# Patient Record
Sex: Male | Born: 1935 | Race: White | Hispanic: No | Marital: Married | Smoking: Never smoker
Health system: Southern US, Community
[De-identification: ages and names within clinical notes are randomized; demographics above are authoritative.]

## PROBLEM LIST (undated history)

## (undated) ENCOUNTER — Other Ambulatory Visit: Admission: RE | Payer: Self-pay | Source: Ambulatory Visit | Admitting: Transplant Surgery Liver and Kidney

## (undated) DIAGNOSIS — F0390 Unspecified dementia without behavioral disturbance: Secondary | ICD-10-CM

## (undated) DIAGNOSIS — E119 Type 2 diabetes mellitus without complications: Secondary | ICD-10-CM

## (undated) DIAGNOSIS — M1990 Unspecified osteoarthritis, unspecified site: Secondary | ICD-10-CM

## (undated) DIAGNOSIS — I509 Heart failure, unspecified: Secondary | ICD-10-CM

## (undated) DIAGNOSIS — I4891 Unspecified atrial fibrillation: Secondary | ICD-10-CM

## (undated) DIAGNOSIS — D689 Coagulation defect, unspecified: Secondary | ICD-10-CM

## (undated) DIAGNOSIS — B019 Varicella without complication: Secondary | ICD-10-CM

## (undated) DIAGNOSIS — R002 Palpitations: Secondary | ICD-10-CM

## (undated) DIAGNOSIS — M889 Osteitis deformans of unspecified bone: Secondary | ICD-10-CM

## (undated) DIAGNOSIS — R001 Bradycardia, unspecified: Secondary | ICD-10-CM

## (undated) DIAGNOSIS — N400 Enlarged prostate without lower urinary tract symptoms: Secondary | ICD-10-CM

## (undated) DIAGNOSIS — D6859 Other primary thrombophilia: Secondary | ICD-10-CM

## (undated) HISTORY — DX: Osteitis deformans of unspecified bone: M88.9

## (undated) HISTORY — DX: Other primary thrombophilia: D68.59

## (undated) HISTORY — DX: Heart failure, unspecified: I50.9

## (undated) HISTORY — PX: TONSILLECTOMY: SHX28A

## (undated) HISTORY — PX: GALLBLADDER SURGERY: SHX652

## (undated) HISTORY — DX: Type 2 diabetes mellitus without complications: E11.9

## (undated) HISTORY — DX: Unspecified dementia, unspecified severity, without behavioral disturbance, psychotic disturbance, mood disturbance, and anxiety: F03.90

## (undated) HISTORY — DX: Unspecified osteoarthritis, unspecified site: M19.90

## (undated) HISTORY — PX: SKIN GRAFT: SHX250

## (undated) HISTORY — DX: Bradycardia, unspecified: R00.1

## (undated) HISTORY — PX: EYE SURGERY: SHX253

## (undated) HISTORY — DX: Coagulation defect, unspecified: D68.9

## (undated) HISTORY — PX: KNEE REPLACEMENT: SHX530B

## (undated) HISTORY — DX: Varicella without complication: B01.9

## (undated) HISTORY — PX: JOINT REPLACEMENT: SHX530

## (undated) HISTORY — PX: APPENDECTOMY: SHX54

## (undated) HISTORY — DX: Palpitations: R00.2

## (undated) HISTORY — DX: Benign prostatic hyperplasia without lower urinary tract symptoms: N40.0

## (undated) HISTORY — PX: CHOLECYSTECTOMY: SHX55

## (undated) HISTORY — PX: KNEE SURGERY: SHX244

## (undated) HISTORY — DX: Unspecified dementia without behavioral disturbance: F03.90

## (undated) HISTORY — PX: TONSILLECTOMY: SUR1361

## (undated) HISTORY — DX: Unspecified atrial fibrillation: I48.91

---

## 1991-12-18 DIAGNOSIS — F0390 Unspecified dementia without behavioral disturbance: Secondary | ICD-10-CM

## 1991-12-18 HISTORY — DX: Unspecified dementia, unspecified severity, without behavioral disturbance, psychotic disturbance, mood disturbance, and anxiety: F03.90

## 2000-07-09 ENCOUNTER — Other Ambulatory Visit: Payer: Self-pay

## 2001-08-29 ENCOUNTER — Encounter: Payer: Self-pay | Admitting: Cardiology

## 2001-08-30 ENCOUNTER — Encounter: Payer: Self-pay | Admitting: Cardiology

## 2004-03-22 ENCOUNTER — Other Ambulatory Visit: Payer: Self-pay

## 2004-08-10 ENCOUNTER — Encounter: Payer: Self-pay | Admitting: Cardiology

## 2004-09-06 DIAGNOSIS — E669 Obesity, unspecified: Secondary | ICD-10-CM | POA: Insufficient documentation

## 2004-09-06 DIAGNOSIS — I498 Other specified cardiac arrhythmias: Secondary | ICD-10-CM | POA: Insufficient documentation

## 2004-09-06 DIAGNOSIS — F068 Other specified mental disorders due to known physiological condition: Secondary | ICD-10-CM | POA: Insufficient documentation

## 2004-09-06 DIAGNOSIS — E1169 Type 2 diabetes mellitus with other specified complication: Secondary | ICD-10-CM | POA: Insufficient documentation

## 2004-09-06 DIAGNOSIS — M889 Osteitis deformans of unspecified bone: Secondary | ICD-10-CM | POA: Insufficient documentation

## 2005-10-19 LAB — HM COLONOSCOPY

## 2009-01-24 DIAGNOSIS — E8809 Other disorders of plasma-protein metabolism, not elsewhere classified: Secondary | ICD-10-CM | POA: Insufficient documentation

## 2009-11-01 ENCOUNTER — Ambulatory Visit: Payer: Self-pay | Admitting: Otolaryngology

## 2009-11-04 DIAGNOSIS — R040 Epistaxis: Secondary | ICD-10-CM | POA: Insufficient documentation

## 2009-11-07 ENCOUNTER — Ambulatory Visit
Admit: 2009-11-07 | Discharge: 2009-11-07 | Disposition: A | Discharge status: Home / Self Care | Payer: Self-pay | Source: Ambulatory Visit | Attending: Internal Medicine | Admitting: Internal Medicine

## 2009-11-07 LAB — PROTIME-INR
INR: 2.3 — ABNORMAL HIGH (ref 0.9–1.1)
Protime: 25.4 s — ABNORMAL HIGH (ref 11.9–14.7)

## 2009-11-16 ENCOUNTER — Ambulatory Visit: Payer: Self-pay | Admitting: Urology

## 2009-11-18 ENCOUNTER — Ambulatory Visit: Payer: Self-pay | Admitting: Internal Medicine

## 2009-11-28 ENCOUNTER — Encounter: Payer: Self-pay | Admitting: Gastroenterology

## 2009-11-28 ENCOUNTER — Ambulatory Visit: Payer: Self-pay | Admitting: Internal Medicine

## 2009-11-29 ENCOUNTER — Ambulatory Visit: Payer: Self-pay | Admitting: Urology

## 2009-12-02 LAB — MEDICAL CYTOLOGY

## 2009-12-05 ENCOUNTER — Ambulatory Visit
Admit: 2009-12-05 | Discharge: 2009-12-05 | Disposition: A | Discharge status: Home / Self Care | Payer: Self-pay | Source: Ambulatory Visit | Attending: Internal Medicine | Admitting: Internal Medicine

## 2009-12-05 LAB — CBC
Hematocrit: 40 % (ref 40–51)
Hemoglobin: 13.8 g/dL (ref 13.7–17.5)
MCV: 92 fL (ref 79–92)
Platelets: 103 THOU/uL — ABNORMAL LOW (ref 150–330)
RBC: 4.3 MIL/uL — ABNORMAL LOW (ref 4.6–6.1)
RDW: 13.2 % (ref 11.6–14.4)
WBC: 5.6 THOU/uL (ref 4.2–9.1)

## 2009-12-05 LAB — COMPREHENSIVE METABOLIC PANEL
ALT: 26 U/L (ref 0–50)
AST: 29 U/L (ref 0–50)
Albumin: 4.2 g/dL (ref 3.5–5.2)
Alk Phos: 65 U/L (ref 40–130)
Anion Gap: 10 (ref 7–16)
Bilirubin,Total: 0.4 mg/dL (ref 0.0–1.2)
CO2: 24 mmol/L (ref 20–28)
Calcium: 8.7 mg/dL (ref 8.6–10.2)
Chloride: 108 mmol/L (ref 96–108)
Creatinine: 0.85 mg/dL (ref 0.67–1.17)
GFR,Black: >59 *
GFR,Caucasian: >59 *
Glucose: 156 mg/dL — ABNORMAL HIGH (ref 74–106)
Lab: 19 mg/dL (ref 6–20)
Potassium: 4.5 mmol/L (ref 3.3–5.1)
Sodium: 142 mmol/L (ref 133–145)
Total Protein: 6.7 g/dL (ref 6.3–7.7)

## 2009-12-05 LAB — LIPID PANEL
Chol/HDL Ratio: 3.1
Cholesterol: 145 mg/dL
HDL: 47 mg/dL
LDL Calculated: 73 mg/dL
Non HDL Cholesterol: 98 mg/dL
Triglycerides: 126 mg/dL

## 2009-12-05 LAB — PSA: PSA (eff. 4-2010): 0.11 ng/mL (ref 0.00–4.00)

## 2009-12-05 LAB — PROTIME-INR
INR: 2.5 — ABNORMAL HIGH (ref 0.9–1.1)
Protime: 27 s — ABNORMAL HIGH (ref 11.9–14.7)

## 2009-12-05 LAB — TSH: TSH: 5.06 u[IU]/mL — ABNORMAL HIGH (ref 0.27–4.20)

## 2009-12-05 NOTE — Progress Notes (Addendum)
Reason For Visit   [name] is here for a diabetic visit. RBakerLPN Going to [facility]   in the near future, sugars on average 125-130 before breakfast, 97 at   lowest, no symptomatic high or low sugars, no side effects from medication,   sees ophthalmology on a regular basis, no foot complaints, history of   obesity, historically difficult to lose weight, limited aerobic activity,   he feels generally well otherwise and has no specific complaints today.     Medication list reviewed and updated     Review of systems is otherwise negative for other constitutional,   endocrine, cardiac, pulmonary, gastrointestinal, genitourinary complaints     Pleasant, obese, middle-aged gentleman in no acute distress  Oropharynx is pink and moist  Neck is supple without bruits  Cardiac exam has regular rhythm with moderate rate, normal S1 and S2   without murmurs or ectopy  Lungs are clear without wheezes, crackles or evidence of consolidation  Abdomen is benign  No lower extremity edema  Toes are intact light touch with diminished vibratory sensation below the   waist bilaterally     BMI = 19.33     75 year old gentleman with history of protein S deficiency leading to DVT,   stable on Coumadin, history of diabetes which has historically been well   controlled on his current regimen, encouraged increased efforts to get his   hemoglobin A1c below 7,  continuing current medications, encouraged ongoing   aerobic activity, increased efforts towards weight loss through dietary   discretion and increased aerobic exercise, up-to-date on ophthalmologic   exam, no foot findings or complaints, and genuine current medicines.  Would   like his BMI below 30 which would correspond to a maximum weight of 220   pounds.  We'll see him back in April when he returns from down [place].   Amended: Jon [name] ; 12/05/2009 8:09 AM EST.  Allergies   Latex-asked/denied  No Known Drug Allergy.  Current Meds   Simvastatin 40 MG Tablet;TAKE 1 TABLET  DAILY IN THE EVENING.; Rx  Uroxatral 10 MG Tablet Extended Release 24 Hour;TAKE 1 TABLET DAILY by   mouth; Rx  Hydrocodone-Acetaminophen 5-500 MG Tablet;TAKE 1-2 TABLET PO UP TO TID AS   NEEDED. MDD=6; Rx  Aricept 10 MG Tablet;TAKE 1 TABLET DAILY.; Rx  Namenda 10 MG Tablet;TAKE TABLET TWICE DAILY; Rx  Lovastatin 40 MG Tablet;TAKE 1 TABLET DAILY.; Rx  Gabapentin 300 MG Capsule;TAKE 1 CAPSULE TWICE DAILY.; Rx  Vitamin D 2000 UNIT Tablet;TAKE 1 TABLET DAILY; RPT  Erythromycin 5 MG/GM Ointment;1/2" to each eye at HS for up to 1 week; Rx  Sulfacetamide Sodium 10 % Solution;1-2 drops each eye every 3-4 hours for   up to one week.; Rx  Coumadin 2.5 MG Tablet;1 and 1/2 tablets daily; Rx  MetFORMIN HCl 500 MG Tablet;2 tablets by mouth in the morning and 1 by   mouth in the evening (with meals); Rx  Finasteride 5 MG Tablet;TAKE 1 TABLET AS DIRECTED; Rx  Lovenox 150 MG/ML Solution;; RPT  OneTouch Test Strip;Test blood glucose 3 times daily; Rx  One Touch Glucose Control SOLN;USE AS DIRECTED.; Rx  Cyclobenzaprine HCl 10 MG Tablet;TAKE 1 TABLET BEDTIME PRN; Rx.  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  Vital Signs   Recorded by rbaker on 28 Nov 2009 10:54 AM  BP:120/80,  LUE,  Sitting,   HR: 66 b/min,  R Radial, Regular,   Temp: 97.7 F,  Oral,   Weight: 227.4 lb.  Results   PROTIME - PT   07 Nov 2009 03:38 PM  -   INR: 2.3   -   PTI: 25.4 sec  CBC/PLT - CBC   01 Aug 2009 09:04 AM  -   WBC: 5.6 thou/uL  -   RBC: 4.7 MIL/uL  -   HEMOGLOBIN: 14.2 g/dl  -   HEMATOCRIT: 43 %  -   MCV: 92 fL  -   RDW: 13.7 %  -   PLATELET COUNT: 132 thou/uL  COMPREHENSIVE METABOLIC PROF - CMP   01 Aug 2009 09:04 AM  -   SODIUM: 138 mmol/L  -   POTASSIUM: 4.5 mmol/L  -   CHLORIDE: 103 mmol/L  -   CO2: 25 mmol/L  -   ANION  GAP: 10   -   UREA NITROGEN: 19 mg/dL  -   CREATININE: 1.61 mg/dL  -   GFR,CAUCASIAN: > 59  -   GFR,BLACK: > 59  -   GLUCOSE: 138 mg/dL  -   CALCIUM: 9.1 mg/dL  -   TOTAL PROTEIN: 6.9 g/dl  -   ALBUMIN: 4.3 g/dl  -   ALKALINE PHOSPHATASE: 65 u/l  -   T BILI: 0.4 mg/dL  -   AST: 33 u/l  -   ALT: 27 u/l  HEMOGLOBIN A1C - HBA1C   01 Aug 2009 09:04 AM  -   HGB A1C: 7.2 %.  Quality Metrics   A recent examination by an ophthalmologist  and by a podiatrist.  No   tobacco use.  A previous history of smoking.  Monofilament wire test of the   foot was normal.  No lesions on the feet.  Signature   Electronically signed by: Sharmon Leyden  M.D.; 12/05/2009 8:08 AM EST.  Electronically signed by: Sharmon Leyden  M.D.; 12/05/2009 8:09 AM EST.

## 2009-12-06 LAB — HEMOGLOBIN A1C: Hemoglobin A1C: 6.8 % — ABNORMAL HIGH (ref 4.0–6.0)

## 2010-03-22 ENCOUNTER — Ambulatory Visit
Admit: 2010-03-22 | Discharge: 2010-03-22 | Disposition: A | Discharge status: Home / Self Care | Payer: Self-pay | Source: Ambulatory Visit | Attending: Internal Medicine | Admitting: Internal Medicine

## 2010-03-22 LAB — PROTIME-INR
INR: 2 — ABNORMAL HIGH (ref 0.9–1.1)
Protime: 22.9 s — ABNORMAL HIGH (ref 11.9–14.7)

## 2010-03-29 ENCOUNTER — Ambulatory Visit: Payer: Self-pay

## 2010-03-29 VITALS — BP 138/70 | HR 64 | Temp 97.5°F | Resp 18 | Wt 229.1 lb

## 2010-03-29 DIAGNOSIS — I82409 Acute embolism and thrombosis of unspecified deep veins of unspecified lower extremity: Secondary | ICD-10-CM

## 2010-03-30 NOTE — Progress Notes (Signed)
Subjective:      Patient ID: NIMA SILL is a 76 y.o. 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    HPI - Interval history  Since his last visit, Rayansh has continued to do well.  He is active and has not experienced any adverse bleeding events.  He voices no complaints.  Patient's medications, allergies, past medical, surgical, social and family histories were reviewed and updated as appropriate.    Review of Systems   All other systems reviewed and are negative.            Objective:   Filed Vitals:    03/29/10 0843   BP: 138/70   Pulse: 64   Temp: 36.4 C (97.5 F)   Resp: 18         No results found for this or any previous visit (from the past 72 hour(s)).        Physical Exam   Vitals reviewed.  Constitutional: No distress.   Eyes: No scleral icterus.   Cardiovascular: Normal rate, regular rhythm and normal heart sounds.  Exam reveals no gallop and no friction rub.    No murmur heard.  Pulmonary/Chest: Effort normal and breath sounds normal. No respiratory distress. He has no wheezes. He has no rales.   Abdominal: Soft. Bowel sounds are normal. He exhibits no distension and no mass. No tenderness. He has no rebound and no guarding.   Musculoskeletal: He exhibits no edema and no tenderness.   Lymphadenopathy:     He has no cervical adenopathy.       Assessment:     Mr. 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    We are happy to see that he is tolerating coumadin well.  We did not make any changes to his treatment today.  We reviewed the incidence of major adverse bleeding events and advised him to monitor for these.  We did not schedule a return follow up for him, however we remain available should we be of any further assistance.    Plan:     1.  Continue long term coumadin  2.  F/u prn    Merrilee Seashore  MD  Hematology/Oncology Fellow    CC: Abbie Sons

## 2010-04-04 NOTE — Progress Notes (Signed)
Subjective:      Patient ID: Jon [name] is a 76 y.o. 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    HPI - Interval history  Since his last visit, Jon [name] has continued to do well.  He is active and has not experienced any adverse bleeding events.  He voices no complaints.  Patient's medications, allergies, past medical, surgical, social and family histories were reviewed and updated as appropriate.    Review of Systems   All other systems reviewed and are negative.            Objective:   Filed Vitals:    03/29/10 0843   BP: 138/70   Pulse: 64   Temp: 36.4 C (97.5 F)   Resp: 18         No results found for this or any previous visit (from the past 72 hour(s)).        Physical Exam   Vitals reviewed.  Constitutional: No distress.   Eyes: No scleral icterus.   Cardiovascular: Normal rate, regular rhythm and normal heart sounds.  Exam reveals no gallop and no friction rub.    No murmur heard.  Pulmonary/Chest: Effort normal and breath sounds normal. No respiratory distress. He has no wheezes. He has no rales.   Abdominal: Soft. Bowel sounds are normal. He exhibits no distension and no mass. No tenderness. He has no rebound and no guarding.   Musculoskeletal: He exhibits no edema and no tenderness.   Lymphadenopathy:     He has no cervical adenopathy.       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    We are happy to see that he is tolerating coumadin well.  We did not make any changes to his treatment today.  We reviewed the incidence of major adverse bleeding events and advised him to monitor for these.  We did not schedule a return follow up for him, however we remain available should we be of any further assistance.    Plan:     1.  Continue long term coumadin  2.  F/u prn    Merrilee Seashore  MD  Hematology/Oncology Fellow    I saw and evaluated the patient. I agree with the resident's/fellow's findings and plan of care as documented above.    Charlsie Merles      CC: Abbie Sons

## 2010-04-14 ENCOUNTER — Ambulatory Visit: Payer: Self-pay | Admitting: Internal Medicine

## 2010-04-14 NOTE — Progress Notes (Signed)
Reason For Visit   Follow up appt for diabetes check and to review test results. llayer,lpn   Feels tired since being on gabapentin taking it BID more energy with recent   increase in exercising, long-standing history of diabetes, minimal   neuropathy, sees ophthalmology and podiatry regularly, no recent symptoms   to suggest high or low sugars, history of obesity with recent weight gain   in the context of diminished activity, long-standing history of   hyperlipidemia which has been at goal on current regimen, no current   vascular complaints.     Medications reviewed and updated     Review of systems is otherwise negative for other constitutional,   endocrine, cardiac, pulmonary, gastrointestinal, genitourinary complaints     Pleasant overweight 76 year old gentleman in no acute distress  Oropharynx is pink and moist  Neck is supple without bruits  Cardiac exam has regular rhythm with moderate rate, normal S1-S2 without   murmurs  Lungs are clear without wheezes or crackles  No costovertebral angle tenderness  Abdomen is soft, nontender with normal active bowel sounds, no bruits  No lower extremity edema  Toes are intact to light touch with diminished vibratory sensation below   the knee, no foot lesions     Diabetes-well-controlled, continuing current medicines, encouraged ongoing   efforts towards weight loss through dietary discretion and increased   aerobic exercise, lab slip given to check hemoglobin A1c at his convenience     Obesity-encouraged to lose weight aiming for a BMI well over 30, current   BMI = 30.5     Hypertension/elevated blood pressure, has been excellent in the past,   although today, recheck 160/88, encouraged monitoring this and colon with   representative readings in the near future, weight loss, low sodium diet     Hyperlipidemia-at goal in the past no vascular complaints at present,   continuing current medication rechecking fasting lipid profile with next   blood draw     RTC 3 months  lab slip given.  Allergies   Latex-asked/denied  No Known Drug Allergy.  Current Meds   One Touch Glucose Control SOLN;USE AS DIRECTED.; Rx  Lovenox 150 MG/ML Solution;; RPT  Coumadin 2.5 MG Tablet;1 and 1/2 tablets daily; Rx  Gabapentin 300 MG Capsule;TAKE 1 CAPSULE TWICE DAILY.; Rx  MetFORMIN HCl 500 MG Tablet;2 tablets by mouth in the morning and 1 by   mouth in the evening (with meals); Rx  Simvastatin 40 MG Tablet;TAKE 1 TABLET DAILY IN THE EVENING.; Rx  Hydrocodone-Acetaminophen 5-500 MG Tablet;1-2 po q 6 hours prn pain MDD=6;   Rx  Namenda 10 MG Tablet;TAKE ONE TABLET BY MOUTH TWICE A DAY; Rx  Donepezil HCl 10 MG Tablet;take one tablet by mouth every day; Rx  Finasteride 5 MG Tablet;TAKE 1 TABLET AS DIRECTED; Rx  OneTouch Test Strip;TEST 3 TIMES DAILY; Rx.  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  Vital Signs   Recorded by [facility] on 14 Apr 2010 09:02 AM  BP:154/78,  LUE,  Sitting,   HR: 64 b/min,  R Radial, Regular,   Weight: 231 lb.  Quality Metrics   A recent examination by an ophthalmologist  June 2010 and by a podiatrist    04/14/2010.  No tobacco use  and no previous  history of smoking.  A blood   sugar level by fingerstick was determined  2-3 times daily.  Signature   Electronically signed by: Sharmon Leyden  M.D.; 04/14/2010 9:52 AM EST.

## 2010-04-17 ENCOUNTER — Ambulatory Visit
Admit: 2010-04-17 | Discharge: 2010-04-17 | Disposition: A | Discharge status: Home / Self Care | Payer: Self-pay | Source: Ambulatory Visit | Attending: Internal Medicine | Admitting: Internal Medicine

## 2010-04-17 LAB — COMPREHENSIVE METABOLIC PANEL
ALT: 35 U/L (ref 0–50)
AST: 38 U/L (ref 0–50)
Albumin: 4.1 g/dL (ref 3.5–5.2)
Alk Phos: 65 U/L (ref 40–130)
Anion Gap: 7 (ref 7–16)
Bilirubin,Total: 0.5 mg/dL (ref 0.0–1.2)
CO2: 27 mmol/L (ref 20–28)
Calcium: 8.9 mg/dL (ref 8.6–10.2)
Chloride: 104 mmol/L (ref 96–108)
Creatinine: 0.86 mg/dL (ref 0.67–1.17)
GFR,Black: >59 *
GFR,Caucasian: >59 *
Glucose: 168 mg/dL — ABNORMAL HIGH (ref 74–106)
Lab: 15 mg/dL (ref 6–20)
Potassium: 4.6 mmol/L (ref 3.3–5.1)
Sodium: 138 mmol/L (ref 133–145)
Total Protein: 6.6 g/dL (ref 6.3–7.7)

## 2010-04-17 LAB — LIPID PANEL
Chol/HDL Ratio: 2.8
Cholesterol: 132 mg/dL
HDL: 48 mg/dL
LDL Calculated: 63 mg/dL
Non HDL Cholesterol: 84 mg/dL
Triglycerides: 105 mg/dL

## 2010-04-17 LAB — T3: T3: 120 ng/dL (ref 80–200)

## 2010-04-17 LAB — CBC
Hematocrit: 41 % (ref 40–51)
Hemoglobin: 13.5 g/dL — ABNORMAL LOW (ref 13.7–17.5)
MCV: 91 fL (ref 79–92)
Platelets: 120 THOU/uL — ABNORMAL LOW (ref 150–330)
RBC: 4.5 MIL/uL — ABNORMAL LOW (ref 4.6–6.1)
RDW: 13.3 % (ref 11.6–14.4)
WBC: 5 THOU/uL (ref 4.2–9.1)

## 2010-04-17 LAB — T4, FREE: Free T4: 0.8 ng/dL — ABNORMAL LOW (ref 0.9–1.7)

## 2010-04-17 LAB — TSH: TSH: 3.13 u[IU]/mL (ref 0.27–4.20)

## 2010-04-17 LAB — CK: CK: 134 U/L (ref 46–171)

## 2010-04-18 LAB — HEMOGLOBIN A1C: Hemoglobin A1C: 8.1 % — ABNORMAL HIGH (ref 4.0–6.0)

## 2010-04-19 ENCOUNTER — Ambulatory Visit: Payer: Self-pay | Admitting: Urology

## 2010-04-24 ENCOUNTER — Ambulatory Visit
Admit: 2010-04-24 | Discharge: 2010-04-24 | Disposition: A | Discharge status: Home / Self Care | Payer: Self-pay | Source: Ambulatory Visit | Attending: Internal Medicine | Admitting: Internal Medicine

## 2010-04-24 LAB — PROTIME-INR
INR: 2.4 — ABNORMAL HIGH (ref 0.9–1.1)
Protime: 26.2 s — ABNORMAL HIGH (ref 11.9–14.7)

## 2010-05-31 ENCOUNTER — Ambulatory Visit
Admit: 2010-05-31 | Discharge: 2010-05-31 | Disposition: A | Discharge status: Home / Self Care | Payer: Self-pay | Source: Ambulatory Visit | Attending: Internal Medicine | Admitting: Internal Medicine

## 2010-05-31 LAB — PROTIME-INR
INR: 2.7 — ABNORMAL HIGH (ref 0.9–1.1)
Protime: 29.1 s — ABNORMAL HIGH (ref 11.9–14.7)

## 2010-07-03 ENCOUNTER — Ambulatory Visit
Admit: 2010-07-03 | Discharge: 2010-07-03 | Disposition: A | Discharge status: Home / Self Care | Payer: Self-pay | Source: Ambulatory Visit | Attending: Internal Medicine | Admitting: Internal Medicine

## 2010-07-03 LAB — PROTIME-INR
INR: 2.8 — ABNORMAL HIGH (ref 0.9–1.1)
Protime: 30 s — ABNORMAL HIGH (ref 11.9–14.7)

## 2010-07-20 ENCOUNTER — Ambulatory Visit: Payer: Self-pay | Admitting: Internal Medicine

## 2010-07-28 ENCOUNTER — Ambulatory Visit
Admit: 2010-07-28 | Discharge: 2010-07-28 | Disposition: A | Discharge status: Home / Self Care | Payer: Self-pay | Source: Ambulatory Visit | Attending: Internal Medicine | Admitting: Internal Medicine

## 2010-07-28 LAB — PROTIME-INR
INR: 2 — ABNORMAL HIGH (ref 0.9–1.1)
Protime: 23.4 s — ABNORMAL HIGH (ref 11.9–14.7)

## 2010-08-02 ENCOUNTER — Ambulatory Visit: Payer: Self-pay | Admitting: Urology

## 2010-08-04 NOTE — Progress Notes (Signed)
Reason For Visit   Jon [name] is here with his wife for a rash on his left arm, stomach and left   thigh for one week. RBakerLPN Left arm first then abdomen then left thigh,   calamine and benadryl have helped itching, no travel, no change in soap,   fabric, diet or skin care,  no known toxic exposures, no other symptoms, no   fever, no GI distress     Medication list reviewed and updated     Review of systems is otherwise negative for constitutional, cardiac,   pulmonary, gastrointestinal, dermatologic complaints     Pleasant, 76 year old gentleman in no acute distress  Rash as above on left forearm, stomach and left thigh, papular, sparse   papules approximately 3-16mm on average possibly surrounding hair follicles,   some appeared to be unroofed and mild ulcerations, no associated   lymphadenopathy, no surrounding erythema, not vesicles      New onset rash of unclear etiology, possible folliculitis though cannot   rule out vasculitis, arranging mupirocin to use topically for presumptive   folliculitis, also given prescription for Medrol Dosepak in the event that   symptoms are worsening.  Given the level of uncertainty, arranging referral   to dermatology for more definitive assessment and treatment.  Allergies   Latex-asked/denied  No Known Drug Allergy.  Current Meds   Lovenox 150 MG/ML Solution;; RPT  Coumadin 2.5 MG Tablet;1 and 1/2 tablets daily; Rx  Hydrocodone-Acetaminophen 5-500 MG Tablet;1-2 po q 6 hours prn pain MDD=6;   Rx  Namenda 10 MG Tablet;TAKE ONE TABLET BY MOUTH TWICE A DAY; Rx  Finasteride 5 MG Tablet;TAKE 1 TABLET AS DIRECTED; Rx  Non-medication order(s);Blood pressure monitor for prn useDx: 401.9; Rx  OneTouch Test Strip;TEST 3 TIMES DAILY; Rx  Lovastatin 40 MG Tablet;take one tablet by mouth every day; Rx  Gabapentin 300 MG Capsule;TAKE ONE CAPSULE BY MOUTH TWICE A DAY; Rx  Sanctura XR 60 MG Capsule Extended Release 24 Hour;take 1 capsule by mouth   every morning; Rx  Donepezil HCl 10 MG  Tablet;take one tablet by mouth every day; Rx  MetFORMIN HCl 500 MG Tablet;2 tablets by mouth in the morning and 1 by   mouth in the evening (with meals); Rx  Simvastatin 40 MG Tablet;TAKE 1 TABLET DAILY IN THE EVENING.; Rx.  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  Vital Signs   Recorded by rbaker on 20 Jul 2010 04:02 PM  BP:124/68,  RUE,  Sitting,   HR: 64 b/min,  L Radial, Regular,   Weight: 229 lb.  Signature   Electronically signed by: Sharmon Leyden  M.D.; 08/04/2010 6:47 PM EST.

## 2010-09-05 ENCOUNTER — Ambulatory Visit
Admit: 2010-09-05 | Discharge: 2010-09-05 | Disposition: A | Discharge status: Home / Self Care | Payer: Self-pay | Source: Ambulatory Visit | Attending: Internal Medicine | Admitting: Internal Medicine

## 2010-09-05 LAB — COMPREHENSIVE METABOLIC PANEL
ALT: 34 U/L (ref 0–50)
AST: 32 U/L (ref 0–50)
Albumin: 4.1 g/dL (ref 3.5–5.2)
Alk Phos: 64 U/L (ref 40–130)
Anion Gap: 11 (ref 7–16)
Bilirubin,Total: 0.4 mg/dL (ref 0.0–1.2)
CO2: 24 mmol/L (ref 20–28)
Calcium: 8.9 mg/dL (ref 8.6–10.2)
Chloride: 103 mmol/L (ref 96–108)
Creatinine: 0.86 mg/dL (ref 0.67–1.17)
GFR,Black: >59 *
GFR,Caucasian: >59 *
Glucose: 189 mg/dL — ABNORMAL HIGH (ref 74–106)
Lab: 18 mg/dL (ref 6–20)
Potassium: 4.3 mmol/L (ref 3.3–5.1)
Sodium: 138 mmol/L (ref 133–145)
Total Protein: 6.6 g/dL (ref 6.3–7.7)

## 2010-09-05 LAB — CBC
Hematocrit: 41 % (ref 40–51)
Hemoglobin: 13.7 g/dL (ref 13.7–17.5)
MCV: 92 fL (ref 79–92)
Platelets: 117 THOU/uL — ABNORMAL LOW (ref 150–330)
RBC: 4.5 MIL/uL — ABNORMAL LOW (ref 4.6–6.1)
RDW: 13.4 % (ref 11.6–14.4)
WBC: 7.5 THOU/uL (ref 4.2–9.1)

## 2010-09-05 LAB — PROTIME-INR
INR: 2.4 — ABNORMAL HIGH (ref 0.9–1.1)
Protime: 26.1 s — ABNORMAL HIGH (ref 11.9–14.7)

## 2010-09-06 LAB — HEMOGLOBIN A1C: Hemoglobin A1C: 7.8 % — ABNORMAL HIGH (ref 4.0–6.0)

## 2010-10-04 ENCOUNTER — Ambulatory Visit
Admit: 2010-10-04 | Discharge: 2010-10-04 | Disposition: A | Discharge status: Home / Self Care | Payer: Self-pay | Source: Ambulatory Visit | Attending: Internal Medicine | Admitting: Internal Medicine

## 2010-10-04 LAB — PROTIME-INR
INR: 1.8 — ABNORMAL HIGH (ref 0.9–1.1)
Protime: 21.4 s — ABNORMAL HIGH (ref 11.9–14.7)

## 2010-10-12 ENCOUNTER — Ambulatory Visit
Admit: 2010-10-12 | Discharge: 2010-10-12 | Disposition: A | Discharge status: Home / Self Care | Payer: Self-pay | Source: Ambulatory Visit | Attending: Internal Medicine | Admitting: Internal Medicine

## 2010-10-12 LAB — PROTIME-INR
INR: 2 — ABNORMAL HIGH (ref 0.9–1.1)
Protime: 22.5 s — ABNORMAL HIGH (ref 11.9–14.7)

## 2010-11-21 ENCOUNTER — Ambulatory Visit
Admit: 2010-11-21 | Discharge: 2010-11-21 | Disposition: A | Discharge status: Home / Self Care | Payer: Self-pay | Source: Ambulatory Visit | Attending: Internal Medicine | Admitting: Internal Medicine

## 2010-11-21 LAB — PROTIME-INR
INR: 1.4 — ABNORMAL HIGH (ref 0.9–1.1)
Protime: 17.5 s — ABNORMAL HIGH (ref 11.9–14.7)

## 2010-11-24 ENCOUNTER — Ambulatory Visit
Admit: 2010-11-24 | Discharge: 2010-11-24 | Disposition: A | Discharge status: Home / Self Care | Payer: Self-pay | Source: Ambulatory Visit | Attending: Internal Medicine | Admitting: Internal Medicine

## 2010-11-24 LAB — LIPID PANEL
Chol/HDL Ratio: 3
Cholesterol: 138 mg/dL
HDL: 46 mg/dL
LDL Calculated: 68 mg/dL
Non HDL Cholesterol: 92 mg/dL
Triglycerides: 122 mg/dL

## 2010-11-24 LAB — CBC
Hematocrit: 43 % (ref 40–51)
Hemoglobin: 14 g/dL (ref 13.7–17.5)
MCV: 92 fL (ref 79–92)
Platelets: 134 THOU/uL — ABNORMAL LOW (ref 150–330)
RBC: 4.7 MIL/uL (ref 4.6–6.1)
RDW: 13.3 % (ref 11.6–14.4)
WBC: 5.7 THOU/uL (ref 4.2–9.1)

## 2010-11-24 LAB — COMPREHENSIVE METABOLIC PANEL
ALT: 30 U/L (ref 0–50)
AST: 30 U/L (ref 0–50)
Albumin: 4.2 g/dL (ref 3.5–5.2)
Alk Phos: 64 U/L (ref 40–130)
Anion Gap: 11 (ref 7–16)
Bilirubin,Total: 0.5 mg/dL (ref 0.0–1.2)
CO2: 25 mmol/L (ref 20–28)
Calcium: 9.1 mg/dL (ref 8.6–10.2)
Chloride: 107 mmol/L (ref 96–108)
Creatinine: 1.05 mg/dL (ref 0.67–1.17)
GFR,Black: >59 *
GFR,Caucasian: >59 *
Glucose: 161 mg/dL — ABNORMAL HIGH (ref 74–106)
Lab: 20 mg/dL (ref 6–20)
Potassium: 4.8 mmol/L (ref 3.3–5.1)
Sodium: 143 mmol/L (ref 133–145)
Total Protein: 6.9 g/dL (ref 6.3–7.7)

## 2010-11-24 LAB — PROTIME-INR
INR: 1.6 — ABNORMAL HIGH (ref 0.9–1.1)
Protime: 19.3 s — ABNORMAL HIGH (ref 11.9–14.7)

## 2010-11-24 LAB — CK: CK: 83 U/L (ref 46–171)

## 2010-11-25 LAB — HEMOGLOBIN A1C: Hemoglobin A1C: 8.1 % — ABNORMAL HIGH (ref 4.0–6.0)

## 2010-11-30 ENCOUNTER — Ambulatory Visit: Payer: Self-pay | Admitting: Internal Medicine

## 2010-12-01 ENCOUNTER — Ambulatory Visit
Admit: 2010-12-01 | Discharge: 2010-12-01 | Disposition: A | Discharge status: Home / Self Care | Payer: Self-pay | Source: Ambulatory Visit | Attending: Internal Medicine | Admitting: Internal Medicine

## 2010-12-01 LAB — PROTIME-INR
INR: 1.7 — ABNORMAL HIGH (ref 0.9–1.1)
Protime: 20.1 s — ABNORMAL HIGH (ref 11.9–14.7)

## 2010-12-19 NOTE — Progress Notes (Signed)
 Reason For Visit   Jon [name] comes in today with his wife for a follow up visit for diabetes and   hyperlipidemia., long-standing history of diabetes, minimal endorgan   damage, takes medications as prescribed, historically adequately controlled   although less so recently, long-standing history of hyperlipidemia which   has been under good control with simvastatin at current dosage, eats   low-fat, low-salt, low-cholesterol diet, long-standing obesity, has been   working on weight loss through dietary discretion and continuing aerobic   activity and has lost 3-4 pounds since August     Medication list reviewed and updated     Review of systems otherwise negative for other constitutional, endocrine,   cardiac, pulmonary, gastrointestinal, genitourinary complaints     Pleasant 77 year old gentleman in no acute distress  Oropharynx is pink and moist  Neck is supple without bruits  Cardiac exam has regular rhythm with moderate rate, normal S1-S2 without   murmurs or ectopy  Lungs are clear without wheezes, crackles or evidence of consolidation  Abdomen is benign  No lower extremity edema  Toes are intact to light touch and vibratory sensation is only mildly   diminished, no foot lesions.  Allergies   Latex-asked/denied  No Known Drug Allergy.  Current Meds   Finasteride 5 MG Tablet;TAKE 1 TABLET AS DIRECTED; Rx  Non-medication order(s);Blood pressure monitor for prn useDx: 401.9; Rx  OneTouch Test Strip;TEST 3 TIMES DAILY; Rx  Lovastatin 40 MG Tablet;take one tablet by mouth every day; Rx  Gabapentin 300 MG Capsule;TAKE ONE CAPSULE BY MOUTH TWICE A DAY; Rx  Sanctura XR 60 MG Capsule Extended Release 24 Hour;take 1 capsule by mouth   every morning; Rx  Mupirocin 2 % Ointment;APPLY SPARINGLY TO AFFECTED AREA(S) 3 TIMES A DAY; Rx  Coumadin 2.5 MG Tablet;1 and 1/2 tablets daily; Rx  Enoxaparin Sodium 100 MG/ML Solution;1 ml subcutaneously q 12 hours until   therapeutic on warfarin; Rx  Non-medication order(s);OK to be off  lovenox 24 hours before procedure; Rx  Non-medication order(s);Mr. Kawahara can be off of warfarin for 5 days for   an epidural injection but will require bridging with lovenox as we have   done in the past.; Rx  Simvastatin 40 MG Tablet;TAKE 1 TABLET DAILY IN THE EVENING.; Rx  Hydrocodone-Acetaminophen 5-500 MG Tablet;1-2 po q 6 hours prn pain MDD=6;   Rx  Namenda 10 MG Tablet;TAKE ONE TABLET BY MOUTH TWICE A DAY; Rx  MetFORMIN HCl 500 MG Tablet;Take 2 tablets twice a day.; Rx  Donepezil HCl 10 MG Tablet;take one tablet by mouth every day; Rx.  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  Vital Signs   Recorded by rbaker on 30 Nov 2010 11:24 AM  BP:120/78,  RUE,  Sitting,   HR: 68 b/min,  L Radial, Regular,   Weight: 225.2 lb.  Results   PROTIME - PT   24 Nov 2010 08:18 AM  -   INR: 1.6   -   PTI: 19.3 sec  CBC/PLT - CBC   24 Nov 2010 08:18 AM  -   WBC: 5.7 thou/uL  -   RBC: 4.7 MIL/uL  -   HEMOGLOBIN: 14.0 g/dl  -   HEMATOCRIT: 43 %  -   MCV: 92 fL  -  RDW: 13.3 %  -   PLATELET COUNT: 134 thou/uL  COMPREHENSIVE METABOLIC PROF - CMP   24 Nov 2010 08:18 AM  -   SODIUM: 143 mmol/L  -   POTASSIUM: 4.8 mmol/L  -   CHLORIDE: 107 mmol/L  -   CO2: 25 mmol/L  -   ANION GAP: 11   -   UREA NITROGEN: 20 mg/dL  -   CREATININE: 6.04 mg/dL  -   GFR,CAUCASIAN: > 59  -   GFR,BLACK: > 59  -   GLUCOSE: 161 mg/dL  -   CALCIUM: 9.1 mg/dL  -   TOTAL PROTEIN: 6.9 g/dl  -   ALBUMIN: 4.2 g/dl  -   ALKALINE PHOSPHATASE: 64 u/l  -   T BILI: 0.5 mg/dL  -   AST: 30 u/l  -   ALT: 30 u/l  LIPID PROFILE - LIPID   24 Nov 2010 08:18 AM  -   CHOLESTEROL: 138 mg/dL  -   TRIGLYCERIDES: 540 mg/dL  -   HDL: 46 mg/dL  -   LDL (CALC): 68 mg/dL  -   CHOL/HDL RATIO: 3.0  -   NON HDL CHOLESTEROL: 92 mg/dL  CK - CK   24 Nov 2010 08:18  AM  -   CK: 83 u/l  HEMOGLOBIN A1C - HBA1C   24 Nov 2010 08:18 AM  -   HGB A1C: 8.1 %.  Assessment   Diabetes-historically well controlled though less well controlled over the   last year for unclear reasons, possibly greater dietary indiscretion,   possibly less exercise, he has successfully lost a few pounds since last   visit, encouraged adherence to ADA dietary restrictions, starting Januvia   50 mg p.o. q. day, prescription given with cautions, ongoing encouragement   to work on weight loss and increased aerobic activity to help minimize his   need for medications in the future.     Hyperlipidemia-well-controlled on current simvastatin dosage without side   effects, and no vascular symptoms at present, continuing current medication     Obesity-ongoing encouragement to work towards BMI well below 30     Continuing warfarin for coagulopathy, no recent recurrence of DVT or PE     We'll see him back in May/June for diabetes followup when he returns from   down [place].  Signature   Electronically signed by: Sharmon Leyden  M.D.; 12/19/2010 9:31 PM EST.

## 2010-12-25 NOTE — Letter (Signed)
November 29, 2009    Idolina Primer, MD  [address] [place], [place]  [address]      RE:   Baldomero, Deblanc  DOB:  Jul 17, 1934  Unit#: [id number]    Dear Dr. Meredith Mody:    Mr. Critchfield underwent an uncomplicated cystoscopy today for his history of  benign prostatic hypertrophy and lower urinary tract symptoms.    Cystoscopy demonstrated obstructing bilateral lobes of the prostate and the  median lobes goes into the bladder.  He had no evidence of bladder masses,  tumors, or stones, though he did have hypervascularity and petechiae with  filling.  He had some evidence of cellules and trabeculations consistent  with a longstanding history of obstruction.  I did tell Mr. Warzecha, it  does appear by the cystoscopic exam that he would most likely benefit from  resection of the prostate.  Mr. Badolato and his wife understand.  His wife  was in the room with him during the procedure.  They will follow up with me  on their return from [place] in May.    I will keep you updated regarding all urologic care.        Sincerely,              Electronically Signed and Finalized by  Hedda Slade, MD 12/06/2009 14:58  ____________________________________  Hedda Slade, MD          DD:   11/29/2009  DT:   11/29/2009 12:51 P  DVI:  [id number]  [id number]      cc:   Idolina Primer, MD

## 2010-12-25 NOTE — Miscellaneous (Unsigned)
 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)   Diagnosis: Protein S Deficiency (289.81)   Diagnosis: Sinus Bradycardia (427.89)   Diagnosis: Type II Diabetes Mellitus (250.00)   Diagnosis: Thrombophlebitis Of Deep Vessels Of The Lower Extremity 17 Mar 2004 (451.19)     Alerts  Allergy - Latex-asked/denied   Allergy - No Known Drug Allergy     Medications  Coumadin 2.5 MG Tablet; 1 and 1/2 tablets daily ; Rx   Donepezil HCl 10 MG Tablet; take one tablet by mouth every day ; Rx   Finasteride 5 MG Tablet; TAKE 1 TABLET AS DIRECTED ; Rx   Gabapentin 300 MG Capsule; TAKE ONE CAPSULE BY MOUTH TWICE A DAY ; Rx   Hydrocodone-Acetaminophen 5-500 MG Tablet; 1-2 po q 6 hours prn pain MDD=6   ; Rx   Januvia 50 MG Tablet; TAKE 1 TABLET DAILY. ; Rx   Lovastatin 40 MG Tablet; take one tablet by mouth every day ; Rx   MetFORMIN HCl 1000 MG Tablet; TAKE 1 TABLET TWICE DAILY. ; Rx   Mupirocin 2 % Ointment; APPLY SPARINGLY TO AFFECTED AREA(S) 3 TIMES A DAY ;   Rx   Namenda 10 MG Tablet; TAKE ONE TABLET BY MOUTH TWICE A DAY ; Rx   Non-medication order(s); Mr. Kishi can be off of warfarin for 5 days for   an epidural injection but will require bridging with lovenox as we have   done in the past. ; Rx   Non-medication order(s); OK to be off lovenox 24 hours before procedure ;   Rx   Non-medication order(s); Blood pressure monitor for prn useDx: 401.9 ; Rx   OneTouch Test Strip; TEST 3 TIMES DAILY ; Rx   OneTouch Ultra Control Solution; Use up to weekly for prn glucometer   calibration ; Rx   Sanctura XR 60 MG Capsule Extended Release 24 Hour; take 1 capsule by mouth   every morning ; Rx   Simvastatin 40 MG Tablet; TAKE 1 TABLET DAILY IN THE EVENING. ; Rx      Immunizations  Pneumo (Pneumovax)   Influenza (Split)   Influenza (Split)   Tetanus IG   Influenza (Split)   Influenza (Split)   Pneumo (Pneumovax)   Influenza (Split)   Influenza (Split)

## 2010-12-25 NOTE — Letter (Signed)
Apr 19, 2010    Idolina Primer, MD  [address] [place], [place]  [address]      RE:   [name], [name]  DOB:  [birth year]  Unit#: [id number]    Dear Dr. Meredith Mody:    I saw [name] in the urology clinic today in followup for his history  of benign prostatic hypertrophy and lower urinary tract symptoms.  At his  last visit with me, I had performed a cystoscopy which showed bilateral  obstructing lobes of the prostate, and [name] was going to think about  potentially going ahead with resection of the prostate while he was away in  the [place] this past couple of months.  He tells me that he has recently  begun working out 4 times per week up to 45 minutes a day and has noticed  with the increased exercise those urinary symptoms have improved.  Because  of this, he is reluctant to go ahead with any kind of intervention  surgically.    He does occasionally still have some leakage with straining, and I offered  him an anticholinergic to see if we can improve him in the short term in  order to optimize his exercising ability.  He would like to go ahead with  this, and I have given him a prescription as well as a sample of Sanctura.  His urine analysis today is normal.  I did tell him if he feels comfortable  and he is not bothered by his symptoms, then we can certainly continue to  follow him.  I would have him continue on his finasteride.  He will follow  up with me in 3 to 4 months to see how well the [name] is working for  him.    I will keep you updated regarding all urologic care.      Sincerely,              Electronically Signed and Finalized by  Hedda Slade, MD 04/24/2010 13:38  ____________________________________  Hedda Slade, MD          DD:   04/19/2010  DT:   04/19/2010  8:37 P  DVI:  [id number]  HY/[id number]#[id number]      cc:   Idolina Primer, MD

## 2010-12-25 NOTE — Letter (Signed)
August 02, 2010    Idolina Primer, MD  [address] [place], [place]  [address]      RE:   Jon [name], Jon [name]  DOB:  11/17/1934  Unit#: [id number]    Dear Dr. Meredith Mody:    I saw [name] in followup in the urology clinic today for his history  of benign prostatic hypertrophy and lower urinary tract symptoms.  Since  his last visit with me, he has had a couple of issues including back pain  which prevented him from exercising as often as previously and recent  steroid taper which caused some of his lower urinary tract symptoms to be  come exacerbated, but this is now resolving.  His urgency and frequency is  much improved on the [place], and he feels this is working well with him.      On physical exam, testes are descended bilaterally.  No testicular masses.  Normal uncircumcised phallus.  Foreskin retracts easily over the glans  penis.  Glans penis is normal.  Urethral meatus is normal.  No penile  plaques or abnormalities.  Scrotum, anus, and perineum are normal.  Sphincter tone is normal.  Prostate is enlarged but bilaterally  symmetrically so.  No nodules or abnormality.    We will check PSA on him with his next set of blood tests, and he will call  me for that result after it is completed.  In the meanwhile, he will  continue on his finasteride and Sanctura.    I will keep you updated regarding all urologic care.      Sincerely,              Electronically Signed and Finalized by  Hedda Slade, MD 08/08/2010 13:37  ____________________________________  Hedda Slade, MD          DD:   08/02/2010  DT:   08/02/2010 10:14 P  DVI:  [id number]  [id number]      cc:   Idolina Primer, MD

## 2010-12-25 NOTE — Letter (Signed)
 November 16, 2009    Idolina Primer, MD  [address] [place], [place]  [address]    RE:   Jon, [name]  DOB:  1934-10-29  Unit#: [id number]    Dear Dr. Meredith Mody:    I had the pleasure of seeing Jon [name] in Urology Clinic in follow-up for  his history of lower urinary tract symptoms and urinary retention.    He has done well over the past year, and he stopped his Uroxatral  approximately 1 week ago. He says, though, recently in the past couple of  months, he has noticed urinary urgency, which requires him to wear a pad,  as he often loses several drops of urine, which is quite socially  embarrassing for him. He says the episodes occur 2 to 3 times per month or  occasionally not at all for a month.    On physical examination, bladder is nonpalpable. Abdomen is soft with  positive bowel sounds, nontender, nondistended. Testes are descended  bilaterally. There are no testicular masses. He has a normal, uncircumcised  phallus. The foreskin retracts easily over the glans penis.  Normal  urethra. No evidence for glandular lesions. Scrotum, anus, and perineum are  normal. Prostate exam: Prostate is flat, smooth, and nontender. No  asymmetry or nodules.    At this point in time, I did tell Mr. Alcock that I would like him to  continue on the finasteride, but his urge incontinence is concerning to me,  and I did suggest to him that further diagnosis with cystoscopy in order to  evaluate the actual size of the obstructing lobes of the prostate may be  beneficial in planning future surgery, if necessary. I did tell him, also,  there is a chance that coming off of the Uroxatral over the next several  months, we may see complete resolution of these incontinence episodes.    He would like to go ahead with diagnostic cystoscopy, and he and his wife  are planning to go away for the winter to [place]. When they return  in April, he will give me an update on his status. I think this is  appropriate.    I will keep  you updated regarding all urologic care.    Sincerely,              Electronically Signed and Finalized by  Hedda Slade, MD 11/25/2009 16:39  ____________________________________  Hedda Slade, MD          DD:   11/16/2009  DT:   11/16/2009 11:56 A  DVI:  [id number]  [id number]/[id number]      cc:   Idolina Primer, MD

## 2011-04-04 LAB — PROTIME-INR: INR: 2.85

## 2011-04-19 ENCOUNTER — Ambulatory Visit: Payer: Self-pay | Admitting: Internal Medicine

## 2011-04-19 DIAGNOSIS — E8809 Other disorders of plasma-protein metabolism, not elsewhere classified: Secondary | ICD-10-CM

## 2011-04-27 ENCOUNTER — Other Ambulatory Visit: Payer: Self-pay

## 2011-04-27 DIAGNOSIS — G629 Polyneuropathy, unspecified: Secondary | ICD-10-CM

## 2011-04-27 MED ORDER — GABAPENTIN 300 MG PO CAPS
300.0000 mg | ORAL_CAPSULE | Freq: Two times a day (BID) | ORAL | Status: DC
Start: 2011-04-27 — End: 2011-11-27

## 2011-04-30 ENCOUNTER — Encounter: Payer: Self-pay | Admitting: Gastroenterology

## 2011-05-08 ENCOUNTER — Ambulatory Visit
Admit: 2011-05-08 | Discharge: 2011-05-08 | Disposition: A | Discharge status: Home / Self Care | Payer: Self-pay | Source: Ambulatory Visit | Attending: Internal Medicine | Admitting: Internal Medicine

## 2011-05-08 LAB — PROTIME-INR
INR: 2.6 — ABNORMAL HIGH (ref 0.9–1.1)
Protime: 26.9 s — ABNORMAL HIGH (ref 11.9–14.7)

## 2011-05-09 ENCOUNTER — Ambulatory Visit: Payer: Self-pay | Admitting: Internal Medicine

## 2011-05-09 VITALS — BP 112/78 | HR 62 | Wt 222.0 lb

## 2011-05-09 DIAGNOSIS — E119 Type 2 diabetes mellitus without complications: Secondary | ICD-10-CM

## 2011-05-09 DIAGNOSIS — E8809 Other disorders of plasma-protein metabolism, not elsewhere classified: Secondary | ICD-10-CM

## 2011-05-09 DIAGNOSIS — M25551 Pain in right hip: Secondary | ICD-10-CM

## 2011-05-09 DIAGNOSIS — M25512 Pain in left shoulder: Secondary | ICD-10-CM

## 2011-05-09 DIAGNOSIS — M79606 Pain in leg, unspecified: Secondary | ICD-10-CM

## 2011-05-09 DIAGNOSIS — R040 Epistaxis: Secondary | ICD-10-CM

## 2011-05-09 DIAGNOSIS — R1030 Lower abdominal pain, unspecified: Secondary | ICD-10-CM

## 2011-05-09 MED ORDER — DICLOFENAC SODIUM 1 % EX GEL *I*
CUTANEOUS | Status: DC
Start: 2011-05-09 — End: 2012-08-12

## 2011-05-09 MED ORDER — HYDROCODONE-ACETAMINOPHEN 5-500 MG PO TABS
ORAL_TABLET | ORAL | Status: DC
Start: 2011-05-09 — End: 2011-11-27

## 2011-05-09 NOTE — Patient Instructions (Signed)
 voltaren gel to your left shoulder 3-4 times a day as needed    Weighted pendulum exercises with 5-10 lbs 3-4 times daily for 5-10 minutes    Ice to your shoulder 3-4 times a day for 10 minutes    Xray of right hip    Continue to do the right things for your diabetes    Keep working on weight loss    Call Dr. Rosezetta Schlatter if nose bleeds continue    Return in September

## 2011-05-09 NOTE — Progress Notes (Signed)
 Intermittent groin pain, worse over the last week, right sided, interferes with walking, better with walking,  left shoulder pain starting over the winter, Intermittent Intensity 3-4/10 at maximum, nonradiating, better with rest, worse with activities such as golf, long-standing history of diabetes which has been adequately controlled on his current regimen, he has had 2 or 3 episodes of epistaxis in the recent past which have resolved spontaneously without significant blood loss    Medication list reviewed and updated    Review of systems is otherwise negative for other constitutional, endocrine, cardiac, pulmonary, gastrointestinal, genitourinary complaints    Pleasant well-developed, well-nourished and well-appearing middle-age gentleman  Nares are patent With normal appearing mucosa and turbinates, no evidence of recent bleeding  Oropharynx is pink and moist  Neck is supple  Cardiac exam has regular rhythm with moderate rate, normal S1 and S2 without murmurs or ectopy  Lungs are clear without wheezes or crackles  Abdomen is benign  Right hip has full range of passive motion though mild involuntary external rotation with flexion, mild limitation on internal rotation, no tenderness over the greater trochanter  Left shoulder has full range of passive motion without crepitus, no tenderness at the a.c. Joint, subacromial bursa or biceps tendon, muscles of the rotator cuff have intact strength    Diabetes-historically well controlledAlthough recent hemoglobin A1c = 8.1, He feels he can improve on this with increased dietary discretion and exercise, rechecking hemoglobin A1c in 3 months, increased medication if not significantly improved    Epistaxis-no recent injury, No bruising to suggest a bleeding problem, if persistent will check coagulation values and plateletsAnd consider referral to otolaryngology    Left shoulder pain-intermittent/variable symptoms, absent today, conservative therapy with ice, NSAIDs, Voltaren  gel, avoiding activities which aggravate this and he will call if symptoms are not improving    Right groin pain possible manifestation of osteoarthritis, arranging plain films at his convenience    We'll see him back in September, labs prior to that visit.

## 2011-05-10 ENCOUNTER — Telehealth: Payer: Self-pay | Admitting: Internal Medicine

## 2011-05-10 NOTE — Telephone Encounter (Signed)
 Left detailed message with results and recommendations and to call the office if he would like a referral.

## 2011-05-10 NOTE — Telephone Encounter (Signed)
 Message copied by Annye Rusk on Thu May 10, 2011 10:22 AM  ------       Message from: Linus Orn       Created: Thu May 10, 2011  8:02 AM         Please let him know xray shows mild arthritis in that hip, looks similar to films from 8/09, makes it more likely that his pain is stemming from his back, may try the back exercises and if necessary next step would be to see ortho spine for the likelihood that this is from a pinched nerve in his lower back. ps

## 2011-06-12 ENCOUNTER — Telehealth: Payer: Self-pay | Admitting: Internal Medicine

## 2011-06-12 DIAGNOSIS — D6859 Other primary thrombophilia: Secondary | ICD-10-CM

## 2011-06-12 NOTE — Telephone Encounter (Signed)
Other standing order expired. Please authorize new order. Thanks, Vibha Ferdig

## 2011-06-12 NOTE — Telephone Encounter (Signed)
Spoke with Aurea Graff, INR standing order expired so a new one will be issued and [name] will get done

## 2011-06-13 ENCOUNTER — Ambulatory Visit
Admit: 2011-06-13 | Discharge: 2011-06-13 | Disposition: A | Discharge status: Home / Self Care | Payer: Self-pay | Source: Ambulatory Visit | Attending: Internal Medicine | Admitting: Internal Medicine

## 2011-06-13 ENCOUNTER — Ambulatory Visit: Payer: Self-pay | Admitting: Internal Medicine

## 2011-06-13 DIAGNOSIS — D6859 Other primary thrombophilia: Secondary | ICD-10-CM

## 2011-06-13 DIAGNOSIS — E8809 Other disorders of plasma-protein metabolism, not elsewhere classified: Secondary | ICD-10-CM

## 2011-06-13 LAB — PROTIME-INR
INR: 3 — ABNORMAL HIGH (ref 0.9–1.1)
Protime: 30.1 s — ABNORMAL HIGH (ref 11.9–14.7)

## 2011-06-15 ENCOUNTER — Encounter: Payer: Self-pay | Admitting: Urology

## 2011-06-15 ENCOUNTER — Other Ambulatory Visit: Payer: Self-pay | Admitting: Urology

## 2011-06-15 DIAGNOSIS — N3281 Overactive bladder: Secondary | ICD-10-CM

## 2011-06-15 MED ORDER — TROSPIUM CHLORIDE 60 MG PO CP24 *A*
ORAL_CAPSULE | ORAL | Status: DC
Start: 2011-06-15 — End: 2011-08-07

## 2011-06-15 NOTE — Telephone Encounter (Signed)
Patient is out of [place] and requesting more.  [facility] patient--she (and Almira Coaster) is out of town.  Routed to you because you are on-call.

## 2011-06-15 NOTE — Telephone Encounter (Signed)
 error

## 2011-07-10 ENCOUNTER — Other Ambulatory Visit: Payer: Self-pay | Admitting: Primary Care

## 2011-07-11 MED ORDER — DONEPEZIL HCL 10 MG PO TABS *I*
10.0000 mg | ORAL_TABLET | Freq: Every evening | ORAL | Status: DC
Start: 2011-07-10 — End: 2011-08-29

## 2011-07-11 MED ORDER — MEMANTINE HCL 10 MG PO TABS *I*
ORAL_TABLET | ORAL | Status: DC
Start: 2011-07-10 — End: 2011-11-27

## 2011-07-13 ENCOUNTER — Ambulatory Visit
Admit: 2011-07-13 | Discharge: 2011-07-13 | Disposition: A | Discharge status: Home / Self Care | Payer: Self-pay | Source: Ambulatory Visit | Attending: Internal Medicine | Admitting: Internal Medicine

## 2011-07-13 DIAGNOSIS — D6859 Other primary thrombophilia: Secondary | ICD-10-CM

## 2011-07-13 LAB — PROTIME-INR
INR: 2.4 — ABNORMAL HIGH (ref 0.9–1.1)
Protime: 25 s — ABNORMAL HIGH (ref 11.9–14.7)

## 2011-07-16 ENCOUNTER — Ambulatory Visit: Payer: Self-pay | Admitting: Internal Medicine

## 2011-07-16 ENCOUNTER — Telehealth: Payer: Self-pay | Admitting: Internal Medicine

## 2011-07-16 DIAGNOSIS — E8809 Other disorders of plasma-protein metabolism, not elsewhere classified: Secondary | ICD-10-CM

## 2011-07-16 NOTE — Telephone Encounter (Signed)
Message copied by Annye Rusk on Mon Jul 16, 2011  9:43 AM  ------       Message from: [facility], DAVID       Created: Fri Jul 13, 2011  5:50 PM         Not sure if we're following his INRs, however if we are, he should continue whatever he is doing and recheck in one month. dh

## 2011-07-16 NOTE — Telephone Encounter (Signed)
Spoke with wife and reviewed INR

## 2011-07-23 ENCOUNTER — Other Ambulatory Visit: Payer: Self-pay | Admitting: Primary Care

## 2011-07-23 MED ORDER — LOVASTATIN 40 MG PO TABS *I*
ORAL_TABLET | ORAL | Status: DC
Start: 2011-07-23 — End: 2011-08-29

## 2011-07-30 ENCOUNTER — Encounter: Payer: Self-pay | Admitting: Gastroenterology

## 2011-08-01 ENCOUNTER — Ambulatory Visit: Payer: Self-pay | Admitting: Urology

## 2011-08-03 ENCOUNTER — Ambulatory Visit
Admit: 2011-08-03 | Discharge: 2011-08-03 | Disposition: A | Discharge status: Home / Self Care | Payer: Self-pay | Source: Ambulatory Visit | Attending: Urology | Admitting: Urology

## 2011-08-03 DIAGNOSIS — D6859 Other primary thrombophilia: Secondary | ICD-10-CM

## 2011-08-03 LAB — PROTIME-INR
INR: 2.5 — ABNORMAL HIGH (ref 0.9–1.1)
Protime: 25.8 s — ABNORMAL HIGH (ref 11.9–14.7)

## 2011-08-03 LAB — MULTIPLE ORDERING DOCS

## 2011-08-03 LAB — PSA (EFF.4-2010): PSA (eff. 4-2010): 0.09 ng/mL (ref 0.00–4.00)

## 2011-08-06 ENCOUNTER — Telehealth: Payer: Self-pay | Admitting: Internal Medicine

## 2011-08-06 ENCOUNTER — Ambulatory Visit: Payer: Self-pay | Admitting: Internal Medicine

## 2011-08-06 DIAGNOSIS — E8809 Other disorders of plasma-protein metabolism, not elsewhere classified: Secondary | ICD-10-CM

## 2011-08-06 NOTE — Telephone Encounter (Signed)
Spoke with Joan and reviewed results.

## 2011-08-06 NOTE — Telephone Encounter (Signed)
Message copied by Annye Rusk on Mon Aug 06, 2011 12:58 PM  ------       Message from: [facility], DAVID       Created: Mon Aug 06, 2011 12:25 PM         Continue current dose, recheck in 1 month. dh

## 2011-08-07 ENCOUNTER — Encounter: Payer: Self-pay | Admitting: Urology

## 2011-08-07 ENCOUNTER — Ambulatory Visit: Payer: Self-pay | Admitting: Urology

## 2011-08-07 VITALS — BP 110/70 | HR 81 | Temp 97.0°F | Ht 72.84 in | Wt 218.0 lb

## 2011-08-07 DIAGNOSIS — N400 Enlarged prostate without lower urinary tract symptoms: Secondary | ICD-10-CM

## 2011-08-07 DIAGNOSIS — Z1389 Encounter for screening for other disorder: Secondary | ICD-10-CM

## 2011-08-07 DIAGNOSIS — R3915 Urgency of urination: Secondary | ICD-10-CM | POA: Insufficient documentation

## 2011-08-07 LAB — POCT URINALYSIS DIPSTICK
Blood,UA POCT: NEGATIVE
Glucose,UA POCT: 50 — AB
Ketones,UA POCT: NEGATIVE
Leuk Esterase,UA POCT: NEGATIVE
Lot #: 21155702
Nitrite,UA POCT: NEGATIVE
PH,UA POCT: 5 (ref 5–8)

## 2011-08-07 MED ORDER — FINASTERIDE 5 MG PO TABS *I*
5.0000 mg | ORAL_TABLET | Freq: Every day | ORAL | Status: AC
Start: 2011-08-07 — End: 2011-11-05

## 2011-08-07 MED ORDER — TROSPIUM CHLORIDE 60 MG PO CP24 *A*
60.0000 mg | ORAL_CAPSULE | Freq: Every day | ORAL | Status: AC
Start: 2011-08-07 — End: 2011-11-05

## 2011-08-07 NOTE — Progress Notes (Signed)
[facility] Follow Up Visit    LUIGI LORMAN  08/07/2011    Chief complaint : bph and urinary urgency    HPI: doing well, no urinary complaints - feels meds are working well for him    Changes since prior visit :   PMH - no change  PSH - no change  Medications - no change  Allergies - no change  ROS - no change    Physical Exam :   General appearance - well appearing  Back -no CVAT  Abdomen - soft, no masses, no hepatosplenomegaly, non-distended bladder, no inguinal hernia, no tenderness  Lymph nodes - nl  Extremities/pulses -nl    Genitourinary - Male -  Penis -  normal and circumcized  Urethral Meatus -  Normal  Scrotum -  Normal  Right and left testes - Normal  Right and left epididymis -  Normal  Anus and Perineum -  normal, normal tone and No masses  Prostate -  benign - soft, NT, symmetric, no nodules, no induration  Seminal Vesicles -  Normal    Studies reviewed - PSA 0.09 (actual.2 - finasteride)      Medical Decision Making :Discussed FPTF reccomendations against PCA screening and after discussion pt would like to continue with screening   Diagnosis established - BPH and urgency  Plan of care - Pt would like to continue screening over 1-2 yrs while on finasteride

## 2011-08-29 ENCOUNTER — Ambulatory Visit: Payer: Self-pay | Admitting: Internal Medicine

## 2011-08-29 ENCOUNTER — Encounter: Payer: Self-pay | Admitting: Internal Medicine

## 2011-08-29 VITALS — BP 110/68 | HR 60 | Ht 71.0 in | Wt 223.0 lb

## 2011-08-29 DIAGNOSIS — M7540 Impingement syndrome of unspecified shoulder: Secondary | ICD-10-CM

## 2011-08-29 DIAGNOSIS — Z23 Encounter for immunization: Secondary | ICD-10-CM

## 2011-08-29 DIAGNOSIS — G4762 Sleep related leg cramps: Secondary | ICD-10-CM

## 2011-08-29 DIAGNOSIS — E119 Type 2 diabetes mellitus without complications: Secondary | ICD-10-CM

## 2011-08-29 NOTE — Progress Notes (Signed)
Chief Complaint   Patient presents with   . Diabetes       HPI: Having shoulder pain and leg pain. Takes vicodin for this. Has history of DVT. Does take lovastatin. Legs cramp at night.     Shoulder pain present for last 6 months or so. Also has pain when lifting a gallon of milk. Also has history of bursitis in the shoulder.     Feels things with Diabetes are "OK." Has not been checking this month. Typically below 150. No polyuria or polydipsia.     Allergies:  Allergies   Allergen Reactions   . No Known Drug Allergy      Created by Conversion - 0;    . No Known Latex Allergy      Created by Conversion - 0;        Problem List  Patient Active Problem List   Diagnoses Code   . Sinus Bradycardia 427.89   . Dementia 294.8   . Paget's Disease 731.0   . Type II Diabetes Mellitus 250.00   . Obesity 278.00   . Thrombophlebitis Of Deep Vessels Of The Lower Extremity 451.1   . Protein S Deficiency 273.8   . Epistaxis 784.7   . Urgency of urination 788.63   . BPH (benign prostatic hyperplasia) 600.90       Medications:   Current Outpatient Prescriptions   Medication   . cholecalciferol (VITAMIN D) 1000 UNIT tablet   . trospium (SANCTURA XR) extended release capsule   . finasteride (PROSCAR) 5 MG tablet   . memantine (NAMENDA) 10 MG tablet   . diclofenac (VOLTAREN) 1 % gel   . HYDROcodone-acetaminophen (VICODIN) 5-500 MG per tablet   . gabapentin (NEURONTIN) 300 MG capsule   . Blood Glucose Calibration (OT ULTRA/FASTTK CNTRL SOLN) SOLN   . metFORMIN (GLUCOPHAGE) 1000 MG tablet   . sitaGLIPtan (JANUVIA) 50 MG tablet   . warfarin (COUMADIN) 5 MG tablet   . WARFARIN SODIUM PO   . FISH OIL   . simvastatin (ZOCOR) 40 MG tablet   . donepezil (ARICEPT) 10 MG tablet   . acetaminophen (TYLENOL) 500 MG tablet   . glucose blood VI test strips (ONE TOUCH TEST STRIPS) test strip     Medication reconciliation performed at time of visit: yes    Vitals:  Filed Vitals:    08/29/11 1016   BP: 110/68   Pulse: 60   Height: 1.803 m (5\' 11" )    Weight: 101.152 kg (223 lb)       Physical Exam:  General: Alert, no acute distress  HEENT: Moist mucus membranes without oral lesions  CV: RRR, no murmurs, rubs, gallops  Pulm: CTAB, no wheezes, rales, rhonchi  Abd: +BS, soft, non-distended, non-tender  Ext: no edema or calf tenderness.  Shoulder examination shows mild pain with internal or external rotation.  Patient able to abduction the arm to approximately 120 with some discomfort.  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  Patient does not want to take NSAIDs as he is on Coumadin.  Advised rotator cuff physical therapy at home.  Reassured does not seem is torn.  We'll back off on lifting exercises as well.    Leg pain does not seem to be related to statin medication, but more likely nocturnal leg cramping.  Advised stretches and tonic water before bed.  We'll check CK to  reassure patient.    No diabetic lab since December, we will check these today.    Saddie Benders, MD  10:26 AM  08/29/2011

## 2011-08-31 ENCOUNTER — Ambulatory Visit
Admit: 2011-08-31 | Discharge: 2011-08-31 | Disposition: A | Discharge status: Home / Self Care | Payer: Self-pay | Source: Ambulatory Visit | Attending: Internal Medicine | Admitting: Internal Medicine

## 2011-08-31 ENCOUNTER — Ambulatory Visit: Payer: Self-pay | Admitting: Internal Medicine

## 2011-08-31 DIAGNOSIS — Z23 Encounter for immunization: Secondary | ICD-10-CM

## 2011-08-31 DIAGNOSIS — E8809 Other disorders of plasma-protein metabolism, not elsewhere classified: Secondary | ICD-10-CM

## 2011-08-31 DIAGNOSIS — E119 Type 2 diabetes mellitus without complications: Secondary | ICD-10-CM

## 2011-08-31 DIAGNOSIS — D6859 Other primary thrombophilia: Secondary | ICD-10-CM

## 2011-08-31 LAB — COMPREHENSIVE METABOLIC PANEL
ALT: 31 U/L (ref 0–50)
AST: 30 U/L (ref 0–50)
Albumin: 4.1 g/dL (ref 3.5–5.2)
Alk Phos: 60 U/L (ref 40–130)
Anion Gap: 11 (ref 7–16)
Bilirubin,Total: 0.5 mg/dL (ref 0.0–1.2)
CO2: 23 mmol/L (ref 20–28)
Calcium: 8.7 mg/dL (ref 8.6–10.2)
Chloride: 104 mmol/L (ref 96–108)
Creatinine: 0.83 mg/dL (ref 0.67–1.17)
GFR,Black: >59 *
GFR,Caucasian: >59 *
Glucose: 187 mg/dL — ABNORMAL HIGH (ref 60–99)
Lab: 19 mg/dL (ref 6–20)
Potassium: 4.3 mmol/L (ref 3.3–5.1)
Sodium: 138 mmol/L (ref 133–145)
Total Protein: 6.9 g/dL (ref 6.3–7.7)

## 2011-08-31 LAB — PROTIME-INR
INR: 2.7 — ABNORMAL HIGH (ref 0.9–1.1)
Protime: 27.5 s — ABNORMAL HIGH (ref 11.9–14.7)

## 2011-08-31 LAB — LIPID PANEL
Chol/HDL Ratio: 3.3
Cholesterol: 147 mg/dL
HDL: 45 mg/dL
LDL Calculated: 76 mg/dL
Non HDL Cholesterol: 102 mg/dL
Triglycerides: 131 mg/dL

## 2011-08-31 LAB — CK: CK: 98 U/L (ref 46–171)

## 2011-09-01 LAB — HEMOGLOBIN A1C: Hemoglobin A1C: 8.3 % — ABNORMAL HIGH (ref 4.0–6.0)

## 2011-09-03 ENCOUNTER — Encounter: Payer: Self-pay | Admitting: Gastroenterology

## 2011-09-05 ENCOUNTER — Telehealth: Payer: Self-pay | Admitting: Primary Care

## 2011-09-05 NOTE — Telephone Encounter (Signed)
Spoke with [name] and reviewed results, he already has an appointment with Dr. Ellwood Handler.

## 2011-09-05 NOTE — Telephone Encounter (Signed)
Message copied by Annye Rusk on Wed Sep 05, 2011 10:08 AM  ------       Message from: Jacqualin Combes       Created: Sat Sep 01, 2011 10:30 AM         Excellent lipid profile, normal CK (no sign of muscle disturbance from statin), and diabetic control remains poor as before.  He'll need prompt f/u with Brett Canales for help with diabetes....  Thanks Rashad Auld.

## 2011-10-04 ENCOUNTER — Other Ambulatory Visit: Payer: Self-pay | Admitting: Internal Medicine

## 2011-10-05 ENCOUNTER — Ambulatory Visit: Payer: Self-pay | Admitting: Primary Care

## 2011-10-05 ENCOUNTER — Ambulatory Visit
Admit: 2011-10-05 | Discharge: 2011-10-05 | Disposition: A | Discharge status: Home / Self Care | Payer: Self-pay | Source: Ambulatory Visit | Attending: Primary Care | Admitting: Primary Care

## 2011-10-05 DIAGNOSIS — D6859 Other primary thrombophilia: Secondary | ICD-10-CM

## 2011-10-05 DIAGNOSIS — E8809 Other disorders of plasma-protein metabolism, not elsewhere classified: Secondary | ICD-10-CM

## 2011-10-05 LAB — PROTIME-INR
INR: 2 — ABNORMAL HIGH (ref 0.9–1.1)
Protime: 22.2 s — ABNORMAL HIGH (ref 11.9–14.7)

## 2011-10-26 ENCOUNTER — Other Ambulatory Visit: Payer: Self-pay | Admitting: Internal Medicine

## 2011-11-13 ENCOUNTER — Telehealth: Payer: Self-pay | Admitting: Primary Care

## 2011-11-13 DIAGNOSIS — D6859 Other primary thrombophilia: Secondary | ICD-10-CM

## 2011-11-13 NOTE — Telephone Encounter (Signed)
done

## 2011-11-13 NOTE — Telephone Encounter (Signed)
Please sign orders.

## 2011-11-15 ENCOUNTER — Ambulatory Visit: Payer: Self-pay | Admitting: Primary Care

## 2011-11-15 ENCOUNTER — Telehealth: Payer: Self-pay | Admitting: Primary Care

## 2011-11-15 ENCOUNTER — Ambulatory Visit
Admit: 2011-11-15 | Discharge: 2011-11-15 | Disposition: A | Discharge status: Home / Self Care | Payer: Self-pay | Source: Ambulatory Visit | Attending: Primary Care | Admitting: Primary Care

## 2011-11-15 DIAGNOSIS — D6859 Other primary thrombophilia: Secondary | ICD-10-CM

## 2011-11-15 DIAGNOSIS — E8809 Other disorders of plasma-protein metabolism, not elsewhere classified: Secondary | ICD-10-CM

## 2011-11-15 LAB — PROTIME-INR
INR: 2.3 — ABNORMAL HIGH (ref 0.9–1.1)
Protime: 24.3 s — ABNORMAL HIGH (ref 11.9–14.7)

## 2011-11-15 NOTE — Telephone Encounter (Signed)
Message copied by Annye Rusk on Thu Nov 15, 2011  3:52 PM  ------       Message from: Clementeen Graham       Created: Thu Nov 15, 2011 11:54 AM         Recheck in 1 month. Thanks

## 2011-11-15 NOTE — Telephone Encounter (Signed)
PT NOTIFIED OF RESULTS

## 2011-11-23 ENCOUNTER — Encounter: Payer: Self-pay | Admitting: Primary Care

## 2011-11-27 ENCOUNTER — Ambulatory Visit: Payer: Self-pay | Admitting: Primary Care

## 2011-11-27 ENCOUNTER — Encounter: Payer: Self-pay | Admitting: Primary Care

## 2011-11-27 VITALS — BP 120/62 | HR 56 | Ht 71.0 in | Wt 225.0 lb

## 2011-11-27 DIAGNOSIS — F0281 Dementia in other diseases classified elsewhere with behavioral disturbance: Secondary | ICD-10-CM

## 2011-11-27 DIAGNOSIS — R972 Elevated prostate specific antigen [PSA]: Secondary | ICD-10-CM

## 2011-11-27 DIAGNOSIS — E7800 Pure hypercholesterolemia, unspecified: Secondary | ICD-10-CM

## 2011-11-27 DIAGNOSIS — M79606 Pain in leg, unspecified: Secondary | ICD-10-CM

## 2011-11-27 DIAGNOSIS — M25512 Pain in left shoulder: Secondary | ICD-10-CM

## 2011-11-27 DIAGNOSIS — G629 Polyneuropathy, unspecified: Secondary | ICD-10-CM

## 2011-11-27 DIAGNOSIS — E119 Type 2 diabetes mellitus without complications: Secondary | ICD-10-CM

## 2011-11-27 DIAGNOSIS — M810 Age-related osteoporosis without current pathological fracture: Secondary | ICD-10-CM

## 2011-11-27 DIAGNOSIS — I4891 Unspecified atrial fibrillation: Secondary | ICD-10-CM

## 2011-11-27 DIAGNOSIS — G309 Alzheimer's disease, unspecified: Secondary | ICD-10-CM

## 2011-11-27 MED ORDER — METFORMIN HCL 1000 MG PO TABS *I*
ORAL_TABLET | ORAL | Status: DC
Start: 2011-11-27 — End: 2012-11-11

## 2011-11-27 MED ORDER — HYDROCODONE-ACETAMINOPHEN 5-500 MG PO TABS
ORAL_TABLET | ORAL | Status: DC
Start: 2011-11-27 — End: 2019-11-19

## 2011-11-27 MED ORDER — GABAPENTIN 300 MG PO CAPS
300.0000 mg | ORAL_CAPSULE | Freq: Three times a day (TID) | ORAL | Status: DC
Start: 2011-11-27 — End: 2012-05-01

## 2011-11-27 MED ORDER — SIMVASTATIN 40 MG PO TABS *I*
ORAL_TABLET | ORAL | Status: DC
Start: 2011-11-27 — End: 2012-05-29

## 2011-11-27 MED ORDER — COUMADIN 2.5 MG PO TABS
2.5000 mg | ORAL_TABLET | Freq: Every day | ORAL | Status: DC
Start: 2011-11-27 — End: 2012-01-11

## 2011-11-27 MED ORDER — SITAGLIPTIN PHOSPHATE 100 MG PO TABS *I*
ORAL_TABLET | ORAL | Status: DC
Start: 2011-11-27 — End: 2012-04-21

## 2011-11-27 MED ORDER — DONEPEZIL HCL 10 MG PO TABS *I*
ORAL_TABLET | ORAL | Status: DC
Start: 2011-11-27 — End: 2012-08-06

## 2011-11-27 MED ORDER — COUMADIN 2.5 MG PO TABS
2.5000 mg | ORAL_TABLET | Freq: Every day | ORAL | Status: DC
Start: 2011-11-27 — End: 2011-11-27

## 2011-11-27 MED ORDER — MEMANTINE HCL 10 MG PO TABS *I*
ORAL_TABLET | ORAL | Status: DC
Start: 2011-11-27 — End: 2012-04-25

## 2011-12-25 ENCOUNTER — Ambulatory Visit: Payer: Self-pay | Admitting: Primary Care

## 2011-12-25 LAB — PROTIME-INR: INR: 1.97

## 2011-12-26 ENCOUNTER — Ambulatory Visit: Payer: Self-pay

## 2011-12-26 DIAGNOSIS — E8809 Other disorders of plasma-protein metabolism, not elsewhere classified: Secondary | ICD-10-CM

## 2012-01-11 ENCOUNTER — Other Ambulatory Visit: Payer: Self-pay | Admitting: Primary Care

## 2012-01-11 DIAGNOSIS — I4891 Unspecified atrial fibrillation: Secondary | ICD-10-CM

## 2012-01-11 MED ORDER — COUMADIN 2.5 MG PO TABS
ORAL_TABLET | ORAL | Status: DC
Start: 2012-01-11 — End: 2012-01-16

## 2012-01-14 ENCOUNTER — Telehealth: Payer: Self-pay | Admitting: Primary Care

## 2012-01-14 NOTE — Telephone Encounter (Signed)
Do I need to do something here?

## 2012-01-14 NOTE — Telephone Encounter (Signed)
[facility] in [place] called to say that the order for Ousman's coumadin was sent with the wrong direction per his wife. His wife later called and asked to speak to the nurse. Thank You

## 2012-01-14 NOTE — Telephone Encounter (Signed)
Spoke with wife and she was confused on the dosing, dosing is correct per wife.

## 2012-01-16 ENCOUNTER — Other Ambulatory Visit: Payer: Self-pay | Admitting: Primary Care

## 2012-01-16 DIAGNOSIS — I4891 Unspecified atrial fibrillation: Secondary | ICD-10-CM

## 2012-01-16 MED ORDER — COUMADIN 2.5 MG PO TABS
ORAL_TABLET | ORAL | Status: DC
Start: 2012-01-16 — End: 2012-05-01

## 2012-01-16 MED ORDER — WARFARIN SODIUM 2.5 MG PO TABS *I*
2.5000 mg | ORAL_TABLET | Freq: Every day | ORAL | Status: AC
Start: 2012-01-16 — End: 2012-04-15

## 2012-01-16 NOTE — Telephone Encounter (Signed)
Please sign new script had to clarify days he was taking 5mg  and 3.75 mg on script.  Thank you

## 2012-02-04 ENCOUNTER — Ambulatory Visit: Payer: Self-pay

## 2012-02-04 ENCOUNTER — Encounter: Payer: Self-pay | Admitting: Primary Care

## 2012-02-04 DIAGNOSIS — E8809 Other disorders of plasma-protein metabolism, not elsewhere classified: Secondary | ICD-10-CM

## 2012-02-04 LAB — PROTIME-INR: INR: 2.66

## 2012-02-18 ENCOUNTER — Telehealth: Payer: Self-pay | Admitting: Primary Care

## 2012-02-18 NOTE — Telephone Encounter (Signed)
Patient went to the ER on February 15, 2012 reguarding an "impaction" at [facility] ER Dept, [address] [place], [place] [address]. He has been referred by the ER to Gurney Maxin, MD (Surgeon) [address] [place], [place] [address] Tel: [phone] re: Ventral Hernia. Patients insurance has been updated and followed up on, the Authorization number which is up for review is [id number]. Patient will have ER fax notes to [facility] at [phone].

## 2012-03-03 ENCOUNTER — Encounter: Payer: Self-pay | Admitting: Gastroenterology

## 2012-03-03 LAB — PROTIME-INR: INR: 1.63

## 2012-03-13 ENCOUNTER — Telehealth: Payer: Self-pay | Admitting: Primary Care

## 2012-03-13 NOTE — Telephone Encounter (Signed)
[name] wife called to ask if he can discontinue his lovastatin. Also, she would like to know if anyone had time to check his coumadin order/results. Please Advise. Thanks

## 2012-03-18 ENCOUNTER — Ambulatory Visit: Payer: Self-pay

## 2012-03-18 ENCOUNTER — Encounter: Payer: Self-pay | Admitting: Primary Care

## 2012-03-18 DIAGNOSIS — E8809 Other disorders of plasma-protein metabolism, not elsewhere classified: Secondary | ICD-10-CM

## 2012-03-28 ENCOUNTER — Encounter: Payer: Self-pay | Admitting: Primary Care

## 2012-03-28 ENCOUNTER — Encounter: Payer: Self-pay | Admitting: Gastroenterology

## 2012-03-28 LAB — PROTIME-INR: INR: 1.94

## 2012-03-31 ENCOUNTER — Ambulatory Visit: Payer: Self-pay | Admitting: Primary Care

## 2012-03-31 DIAGNOSIS — E8809 Other disorders of plasma-protein metabolism, not elsewhere classified: Secondary | ICD-10-CM

## 2012-04-21 ENCOUNTER — Other Ambulatory Visit: Payer: Self-pay | Admitting: Primary Care

## 2012-04-21 DIAGNOSIS — E119 Type 2 diabetes mellitus without complications: Secondary | ICD-10-CM

## 2012-04-21 MED ORDER — FINASTERIDE 5 MG PO TABS *I*
5.0000 mg | ORAL_TABLET | Freq: Every day | ORAL | Status: DC
Start: 2012-04-21 — End: 2012-04-23

## 2012-04-21 MED ORDER — SITAGLIPTIN PHOSPHATE 100 MG PO TABS *I*
ORAL_TABLET | ORAL | Status: DC
Start: 2012-04-21 — End: 2012-04-23

## 2012-04-23 ENCOUNTER — Other Ambulatory Visit: Payer: Self-pay | Admitting: Primary Care

## 2012-04-23 DIAGNOSIS — E119 Type 2 diabetes mellitus without complications: Secondary | ICD-10-CM

## 2012-04-23 LAB — HM DIABETES EYE EXAM

## 2012-04-23 MED ORDER — SITAGLIPTIN PHOSPHATE 100 MG PO TABS *I*
ORAL_TABLET | ORAL | Status: DC
Start: 2012-04-23 — End: 2012-05-28

## 2012-04-23 MED ORDER — FINASTERIDE 5 MG PO TABS *I*
5.0000 mg | ORAL_TABLET | Freq: Every day | ORAL | Status: DC
Start: 2012-04-23 — End: 2012-04-25

## 2012-04-25 ENCOUNTER — Telehealth: Payer: Self-pay | Admitting: Primary Care

## 2012-04-25 ENCOUNTER — Other Ambulatory Visit: Payer: Self-pay | Admitting: Internal Medicine

## 2012-04-25 ENCOUNTER — Other Ambulatory Visit: Payer: Self-pay | Admitting: Primary Care

## 2012-04-25 DIAGNOSIS — F0281 Dementia in other diseases classified elsewhere with behavioral disturbance: Secondary | ICD-10-CM

## 2012-04-25 DIAGNOSIS — G309 Alzheimer's disease, unspecified: Secondary | ICD-10-CM

## 2012-04-25 MED ORDER — FINASTERIDE 5 MG PO TABS *I*
5.0000 mg | ORAL_TABLET | Freq: Every day | ORAL | Status: DC
Start: 2012-04-25 — End: 2012-10-25

## 2012-04-25 MED ORDER — MEMANTINE HCL 10 MG PO TABS *I*
ORAL_TABLET | ORAL | Status: DC
Start: 2012-04-25 — End: 2012-11-20

## 2012-04-25 NOTE — Telephone Encounter (Signed)
I am fine she would like to stop the statin for 3-4 weeks.  He could use OTC  CoQ10 100 mg daily and then rechallenge with the statin to see if the symptoms all return.  If they do there are other statins that may not cause so much muscle ache so we could try a different one at that time.  Thanks

## 2012-04-25 NOTE — Telephone Encounter (Signed)
Pt's wife is calling asking if Jon [name] can discontinue his Statin?  She states he is complaining of muscle cramps and he believes it is due to his statin.  Please advise

## 2012-04-25 NOTE — Telephone Encounter (Signed)
i spoke with patients wife, she voiced understanding and will see SG at appointment in june

## 2012-04-28 ENCOUNTER — Ambulatory Visit
Admit: 2012-04-28 | Discharge: 2012-04-28 | Disposition: A | Discharge status: Home / Self Care | Payer: Self-pay | Source: Ambulatory Visit | Attending: Primary Care | Admitting: Primary Care

## 2012-04-28 DIAGNOSIS — D6859 Other primary thrombophilia: Secondary | ICD-10-CM

## 2012-04-28 LAB — PROTIME-INR
INR: 2.4 — ABNORMAL HIGH (ref 1.0–1.2)
Protime: 25.7 s — ABNORMAL HIGH (ref 9.2–12.3)

## 2012-04-29 ENCOUNTER — Ambulatory Visit: Payer: Self-pay

## 2012-04-29 DIAGNOSIS — E8809 Other disorders of plasma-protein metabolism, not elsewhere classified: Secondary | ICD-10-CM

## 2012-04-29 NOTE — Patient Instructions (Signed)
Left message on home phone with results and to recheck in one month.

## 2012-05-01 ENCOUNTER — Other Ambulatory Visit: Payer: Self-pay | Admitting: Primary Care

## 2012-05-01 DIAGNOSIS — E119 Type 2 diabetes mellitus without complications: Secondary | ICD-10-CM

## 2012-05-01 DIAGNOSIS — G629 Polyneuropathy, unspecified: Secondary | ICD-10-CM

## 2012-05-01 DIAGNOSIS — I4891 Unspecified atrial fibrillation: Secondary | ICD-10-CM

## 2012-05-01 MED ORDER — GABAPENTIN 300 MG PO CAPS
300.0000 mg | ORAL_CAPSULE | Freq: Two times a day (BID) | ORAL | Status: DC
Start: 2012-05-01 — End: 2012-06-18

## 2012-05-01 MED ORDER — COUMADIN 2.5 MG PO TABS
ORAL_TABLET | ORAL | Status: DC
Start: 2012-05-01 — End: 2012-07-27

## 2012-05-08 ENCOUNTER — Encounter: Payer: Self-pay | Admitting: Primary Care

## 2012-05-19 ENCOUNTER — Other Ambulatory Visit: Payer: Self-pay | Admitting: Urology

## 2012-05-19 MED ORDER — TROSPIUM CHLORIDE 60 MG PO CP24 *A*
60.0000 mg | ORAL_CAPSULE | Freq: Every day | ORAL | Status: DC
Start: 2012-05-19 — End: 2012-05-21

## 2012-05-21 ENCOUNTER — Other Ambulatory Visit: Payer: Self-pay | Admitting: Urology

## 2012-05-21 ENCOUNTER — Encounter: Payer: Self-pay | Admitting: Urology

## 2012-05-21 MED ORDER — TROSPIUM CHLORIDE 60 MG PO CP24 *A*
60.0000 mg | ORAL_CAPSULE | Freq: Every day | ORAL | Status: AC
Start: 2012-05-21 — End: 2012-06-20

## 2012-05-23 ENCOUNTER — Encounter: Payer: Self-pay | Admitting: Gastroenterology

## 2012-05-23 LAB — HM DIABETES FOOT EXAM

## 2012-05-28 ENCOUNTER — Ambulatory Visit: Payer: Self-pay

## 2012-05-28 ENCOUNTER — Encounter: Payer: Self-pay | Admitting: Primary Care

## 2012-05-28 ENCOUNTER — Ambulatory Visit: Payer: Self-pay | Admitting: Primary Care

## 2012-05-28 VITALS — BP 122/70 | HR 78 | Wt 218.0 lb

## 2012-05-28 DIAGNOSIS — I639 Cerebral infarction, unspecified: Secondary | ICD-10-CM

## 2012-05-28 DIAGNOSIS — E119 Type 2 diabetes mellitus without complications: Secondary | ICD-10-CM

## 2012-05-28 DIAGNOSIS — D6859 Other primary thrombophilia: Secondary | ICD-10-CM

## 2012-05-28 DIAGNOSIS — M1990 Unspecified osteoarthritis, unspecified site: Secondary | ICD-10-CM

## 2012-05-28 DIAGNOSIS — E7800 Pure hypercholesterolemia, unspecified: Secondary | ICD-10-CM

## 2012-05-28 DIAGNOSIS — E8809 Other disorders of plasma-protein metabolism, not elsewhere classified: Secondary | ICD-10-CM

## 2012-05-28 LAB — URINALYSIS WITH MICROSCOPIC
Blood,UA: NEGATIVE
Ketones, UA: NEGATIVE
Leuk Esterase,UA: NEGATIVE
Nitrite,UA: NEGATIVE
Protein,UA: NEGATIVE mg/dL
RBC,UA: NONE SEEN /hpf (ref 0–2)
Specific Gravity,UA: 1.02 (ref 1.002–1.030)
WBC,UA: <1 /hpf (ref 0–5)
pH,UA: 5 (ref 5.0–8.0)

## 2012-05-28 LAB — LIPID PANEL
Chol/HDL Ratio: 4.1
Cholesterol: 193 mg/dL
HDL: 47 mg/dL
LDL Calculated: 108 mg/dL
Non HDL Cholesterol: 146 mg/dL
Triglycerides: 189 mg/dL — AB

## 2012-05-28 LAB — COMPREHENSIVE METABOLIC PANEL
ALT: 33 U/L (ref 0–50)
AST: 32 U/L (ref 0–50)
Albumin: 4.3 g/dL (ref 3.5–5.2)
Alk Phos: 67 U/L (ref 40–130)
Anion Gap: 13 (ref 7–16)
Bilirubin,Total: 0.4 mg/dL (ref 0.0–1.2)
CO2: 22 mmol/L (ref 20–28)
Calcium: 9 mg/dL (ref 8.6–10.2)
Chloride: 104 mmol/L (ref 96–108)
Creatinine: 0.79 mg/dL (ref 0.67–1.17)
GFR,Black: 101 *
GFR,Caucasian: 87 *
Glucose: 158 mg/dL — ABNORMAL HIGH (ref 60–99)
Lab: 17 mg/dL (ref 6–20)
Potassium: 4.4 mmol/L (ref 3.3–5.1)
Sodium: 139 mmol/L (ref 133–145)
Total Protein: 7.1 g/dL (ref 6.3–7.7)

## 2012-05-28 LAB — CBC
Hematocrit: 43 % (ref 40–51)
Hemoglobin: 14.5 g/dL (ref 13.7–17.5)
MCV: 91 fL (ref 79–92)
Platelets: 137 10*3/uL — ABNORMAL LOW (ref 150–330)
RBC: 4.7 MIL/uL (ref 4.6–6.1)
RDW: 13.3 % (ref 11.6–14.4)
WBC: 6.2 10*3/uL (ref 4.2–9.1)

## 2012-05-28 LAB — PROTIME-INR
INR: 2.2 — ABNORMAL HIGH (ref 1.0–1.2)
Protime: 23.2 s — ABNORMAL HIGH (ref 9.2–12.3)

## 2012-05-28 LAB — VITAMIN B12: Vitamin B12: 387 pg/mL (ref 211–946)

## 2012-05-28 LAB — SEDIMENTATION RATE, AUTOMATED: Sedimentation Rate: 18 mm/hr (ref 0–20)

## 2012-05-28 LAB — CK: CK: 95 U/L (ref 46–171)

## 2012-05-28 LAB — TSH: TSH: 5.1 u[IU]/mL — ABNORMAL HIGH (ref 0.27–4.20)

## 2012-05-28 LAB — URIC ACID: Urate: 5.1 mg/dL (ref 3.9–9.0)

## 2012-05-28 MED ORDER — MELOXICAM 7.5 MG PO TABS *I*
7.5000 mg | ORAL_TABLET | Freq: Every day | ORAL | Status: DC
Start: 2012-05-28 — End: 2012-08-25

## 2012-05-28 NOTE — Patient Instructions (Signed)
Spoke with [name] and reviewed results and he voiced understanding.

## 2012-05-28 NOTE — Progress Notes (Signed)
Quick Note:    Please call and let him know the INR is perfect recheck in one month. The other labs will take another day or 2 to complete. Thanks  ______

## 2012-05-28 NOTE — Progress Notes (Signed)
Patient is in accompanied by his wife after his winter down in [place] ( [employer] for lunch!).  He relates increasing myalgias and particularly some limitation of the left shoulder.  He said his prior knee surgeries and chronic back and is using more Vicodin.  His medications were reviewed, reconciled and remains unchanged.  He's added cinnamon OTC to his regimen to control his blood sugar and an A1c down in [place] was 8.0 down from 8.3 last fall.  His INR remained stable with the Coumadin on board for his protein S deficiency.  Her questions about increasing the Aricept which were discussed with he and his wife as well today.  I do have some concerns that his myalgias may be related to his simvastatin he uses and she did stop this when we communicated by phone several months ago with resolution of some of the myalgias.  With his diabetes he he should be on statin therapy but we don't have new numbers to deal with so I've recommended him to that today.    PE: BP is 122/70 pulse 78 and seems regular BMI is 30 with a weight of 218 pounds stable.  HEENT exam is unremarkable the neck is supple without bruits.  Lungs clear.  Cor exam regular.  The abdomen soft.  Extremities are without clubbing or edema.  Has no focal weakness on neurologic exam the triceps appear intact.  He does have some limitation in abduction of the left shoulder but normal triceps strength.  Vibratory is appreciated again to about 8 seconds.  No labs were done for today's visit.    A/P #1 AODM-marginal control he been recommended to the sitagliptin at 100 daily along with metformin 1 g twice a day.  He apparently is still taking only 50 and using the cinnamon.  We'll check an A1c and difficult to clarify the dosing.  #2 myalgias with left shoulder pain-question PMR check ESR.  Meantime hold statin check CPK continue co-Q10 and consider Pravachol  #3 hyperlipidemia see above continue Pravachol  Number for protein S deficiency continue  Coumadin  #5 CVA with residual MCI-continue Aricept and Namenda.  For now hold 10 mg daily dose although we discussed the 23 mg dosing of Aricept a day.  #6 Paget's/back pain/golfing addiction and other issues as before..  Plan labs with a phone call and return visit in 4 months with repeat blood sugar and cholesterol numbers.

## 2012-05-29 ENCOUNTER — Other Ambulatory Visit: Payer: Self-pay | Admitting: Primary Care

## 2012-05-29 LAB — MICROALBUMIN, URINE, RANDOM
Creatinine,UR: 148 mg/dL (ref 20–300)
Microalb/Creat Ratio: 2.4 mg MA/g CR (ref 0.0–29.9)
Microalbumin,UR: 0.36 mg/dL

## 2012-05-29 LAB — HEMOGLOBIN A1C: Hemoglobin A1C: 8.4 % — ABNORMAL HIGH (ref 4.0–6.0)

## 2012-05-29 MED ORDER — PRAVASTATIN SODIUM 40 MG PO TABS *I*
40.0000 mg | ORAL_TABLET | Freq: Every day | ORAL | Status: DC
Start: 2012-05-29 — End: 2012-08-06

## 2012-05-29 NOTE — Telephone Encounter (Signed)
Message copied by Annye Rusk on Thu May 29, 2012  3:16 PM  ------       Message from: Clementeen Graham       Created: Thu May 29, 2012  1:48 PM         Please call patient and tell him he needs to be taking 1 g of metformin twice a day and the sitagliptin 100 mg once a day in addition to the cinnamon for his diabetes because the A1c is still elevated at 8.4.  He will need to lose 10 pounds.  He should stop the simvastatin and then on July 1 start pravastatin 40 mg daily for his cholesterol.  He needs to take vitamin B 12,000 mcg daily which will help his muscle aches and pains and we need to recheck his labs with return visit in 3 months.  He should continue the CoQ10 at 100 mg daily.  Thanks  ------

## 2012-05-29 NOTE — Progress Notes (Signed)
Quick Note:    Please call patient and tell him he needs to be taking 1 g of metformin twice a day and the sitagliptin 100 mg once a day in addition to the cinnamon for his diabetes because the A1c is still elevated at 8.4. He will need to lose 10 pounds. He should stop the simvastatin and then on July 1 start pravastatin 40 mg daily for his cholesterol. He needs to take vitamin B 12,000 mcg daily which will help his muscle aches and pains and we need to recheck his labs with return visit in 3 months. He should continue the CoQ10 at 100 mg daily. Thanks  ______

## 2012-05-29 NOTE — Telephone Encounter (Signed)
Called pt. And left detailed message on voice mail with new medications, recommendations and to call PRN with any questions or concerns.  Pravastatin ordered, please authorize.

## 2012-06-06 ENCOUNTER — Telehealth: Payer: Self-pay | Admitting: Primary Care

## 2012-06-06 DIAGNOSIS — M25519 Pain in unspecified shoulder: Secondary | ICD-10-CM

## 2012-06-06 NOTE — Telephone Encounter (Signed)
Yes good call.  Patient will prefer or so and likely wants cortisone so I think I would start there and we can leave our spots open.  Thanks

## 2012-06-06 NOTE — Telephone Encounter (Signed)
Jon [name] called complaining of left shoulder pain. He spoke to Ginger, and she scheduled an appointment, but it was on a morning you are unavailable, and it sounds as if he needs to see Ortho anyway. Please Advise.

## 2012-06-10 ENCOUNTER — Telehealth: Payer: Self-pay | Admitting: Gastroenterology

## 2012-06-10 NOTE — Telephone Encounter (Signed)
Left message to book appt

## 2012-06-11 ENCOUNTER — Encounter: Payer: Self-pay | Admitting: Primary Care

## 2012-06-13 ENCOUNTER — Ambulatory Visit: Payer: Self-pay | Admitting: Primary Care

## 2012-06-18 ENCOUNTER — Other Ambulatory Visit: Payer: Self-pay | Admitting: Primary Care

## 2012-07-01 ENCOUNTER — Ambulatory Visit
Admit: 2012-07-01 | Discharge: 2012-07-01 | Disposition: A | Discharge status: Home / Self Care | Payer: Self-pay | Source: Ambulatory Visit | Attending: Primary Care | Admitting: Primary Care

## 2012-07-01 DIAGNOSIS — D6859 Other primary thrombophilia: Secondary | ICD-10-CM

## 2012-07-01 LAB — PROTIME-INR
INR: 3.2 — ABNORMAL HIGH (ref 1.0–1.2)
Protime: 35 s — ABNORMAL HIGH (ref 9.2–12.3)

## 2012-07-03 ENCOUNTER — Ambulatory Visit: Payer: Self-pay

## 2012-07-03 DIAGNOSIS — E8809 Other disorders of plasma-protein metabolism, not elsewhere classified: Secondary | ICD-10-CM

## 2012-07-03 NOTE — Patient Instructions (Signed)
Left message on voice mail and plan and to call PRN.

## 2012-07-04 ENCOUNTER — Other Ambulatory Visit: Payer: Self-pay | Admitting: Primary Care

## 2012-07-04 MED ORDER — SITAGLIPTIN PHOSPHATE 100 MG PO TABS *I*
ORAL_TABLET | ORAL | Status: DC
Start: 2012-07-04 — End: 2012-11-20

## 2012-07-21 ENCOUNTER — Ambulatory Visit: Payer: Self-pay | Admitting: Urology

## 2012-07-25 ENCOUNTER — Ambulatory Visit: Payer: Self-pay | Admitting: Urology

## 2012-07-27 ENCOUNTER — Other Ambulatory Visit: Payer: Self-pay | Admitting: Primary Care

## 2012-08-06 ENCOUNTER — Other Ambulatory Visit: Payer: Self-pay | Admitting: Primary Care

## 2012-08-06 DIAGNOSIS — F0281 Dementia in other diseases classified elsewhere with behavioral disturbance: Secondary | ICD-10-CM

## 2012-08-06 DIAGNOSIS — G309 Alzheimer's disease, unspecified: Secondary | ICD-10-CM

## 2012-08-06 MED ORDER — PRAVASTATIN SODIUM 40 MG PO TABS *I*
40.0000 mg | ORAL_TABLET | Freq: Every day | ORAL | Status: DC
Start: 2012-08-06 — End: 2012-11-20

## 2012-08-06 MED ORDER — DONEPEZIL HCL 10 MG PO TABS *I*
ORAL_TABLET | ORAL | Status: DC
Start: 2012-08-06 — End: 2012-11-20

## 2012-08-08 ENCOUNTER — Ambulatory Visit
Admit: 2012-08-08 | Discharge: 2012-08-08 | Disposition: A | Discharge status: Home / Self Care | Payer: Self-pay | Source: Ambulatory Visit | Attending: Primary Care | Admitting: Primary Care

## 2012-08-08 ENCOUNTER — Ambulatory Visit: Payer: Self-pay

## 2012-08-08 ENCOUNTER — Telehealth: Payer: Self-pay | Admitting: Primary Care

## 2012-08-08 DIAGNOSIS — D6859 Other primary thrombophilia: Secondary | ICD-10-CM

## 2012-08-08 LAB — PROTIME-INR
INR: 2.2 — ABNORMAL HIGH (ref 1.0–1.2)
Protime: 22.9 s — ABNORMAL HIGH (ref 9.2–12.3)

## 2012-08-08 NOTE — Telephone Encounter (Signed)
INR order signed. Thanks.

## 2012-08-08 NOTE — Patient Instructions (Signed)
Spoke with [name] and reviewed results. He voiced understanding.

## 2012-08-12 ENCOUNTER — Encounter: Payer: Self-pay | Admitting: Urology

## 2012-08-12 ENCOUNTER — Ambulatory Visit: Payer: Self-pay | Admitting: Urology

## 2012-08-12 VITALS — BP 133/76 | HR 76 | Ht 71.0 in | Wt 218.0 lb

## 2012-08-12 DIAGNOSIS — Z1389 Encounter for screening for other disorder: Secondary | ICD-10-CM

## 2012-08-12 DIAGNOSIS — N400 Enlarged prostate without lower urinary tract symptoms: Secondary | ICD-10-CM

## 2012-08-12 DIAGNOSIS — N489 Disorder of penis, unspecified: Secondary | ICD-10-CM

## 2012-08-12 LAB — POCT URINALYSIS DIPSTICK
Blood,UA POCT: NEGATIVE
Glucose,UA POCT: 250 — AB
Ketones,UA POCT: NEGATIVE
Leuk Esterase,UA POCT: NEGATIVE
Lot #: 21829501
Nitrite,UA POCT: NEGATIVE
PH,UA POCT: 5 (ref 5–8)
Protein,UA POCT: NEGATIVE mg/dL

## 2012-08-12 MED ORDER — FLUOCINONIDE 0.05 % EX CREA
TOPICAL_CREAM | Freq: Two times a day (BID) | CUTANEOUS | Status: AC
Start: 2012-08-12 — End: 2012-08-19

## 2012-08-12 NOTE — Progress Notes (Signed)
[facility] Urology Follow Up Visit    Jon [name] [facility]  08/12/2012    Chief complaint : bph f/u    HPI: occasional urinary frequency and urgency but well controlled on combination regimen.  I reviewed the strengths and weaknesses of PSA as a screening test for prostate cancer. We reviewed the recent controversies surrounding PSA screening and the recent recommendation by the USPSTF that men in this country no longer be screened with PSA. We discussed the fact the the AUA has a different feeling about PSA screening and takes issue with the USPSTF findings and recommendations. The patient will consider these issues and has decided that he no longer wishes to have further PSA levels drawn.   I discussed with patient causes of PSA elevation including but not limited to: lab error, prostate inflammation, benign prostatic hypertrophy and prostate cancer. I discussed with patient options including repeating PSA, following up with PSA and exam, antibiotic treatment and prostate biopsy. Details of transrectal ultrasound (TRUS)-guided prostate biopsy was explained to the patient. Potential risks of the procedure, including but not limited to bleeding, infection, septic shock, voiding difficulty, erectile dysfunction, leading to over-diagnosis and over-treatment of clinically insignificant prostate cancer, etc, were all discussed  He also complains of a recurring rash on the head of his uncircumcised penis which has been intermittent over the past 8 months or so. He retract the foreskin to wash 2 times per week    Changes since prior visit :   PMH - no change  PSH - no change  Medications - no change  Allergies - no change  ROS - no change    Physical Exam :   General appearance - well appearing, nad  Back - no cvat bilaterally  Abdomen - soft, no masses, no hepatosplenomegaly, non-distended bladder, no inguinal hernia, no tenderness  Lymph nodes - no lad  Extremities/pulses -nl  Genitourinary - Male -  Penis -  normal,  uncircumcized and mild papular erythema at corona of penis and head of the glans   Urethral Meatus -  Normal  Scrotum -  Normal  Right and left testes - Normal  Right and left epididymis -  Normal  Anus and Perineum -  normal and No masses    Studies reviewed -     Component      Latest Ref Rng 08/12/2012   PH,UA POCT      5 - 8 5.0   Leuk Esterase,UA POCT      Negative Negative   Nitrite,UA POCT      Negative Negative   Protein,UA POCT      Negative mg/dL Negative   Glucose,UA POCT      Normal 250 mg/dL (A)   Ketones,UA POCT      Negative Negative   Blood,UA POCT      Negative Negative   Diagnosis established -bph - pt no longer wishes to have PCA screening; dermatitis on head of penis in uncircumcised male  Plan of care -lidex ceam topically 2x day for one week and refer to dermatology; pt would paper Rx for meds when he goes to [place] for the winter in December

## 2012-08-20 ENCOUNTER — Ambulatory Visit
Admit: 2012-08-20 | Discharge: 2012-08-20 | Disposition: A | Discharge status: Home / Self Care | Payer: Self-pay | Source: Ambulatory Visit | Attending: Primary Care | Admitting: Primary Care

## 2012-08-20 DIAGNOSIS — D6859 Other primary thrombophilia: Secondary | ICD-10-CM

## 2012-08-20 LAB — PROTIME-INR
INR: 4.8 — ABNORMAL HIGH (ref 1.0–1.2)
Protime: 52.7 s — ABNORMAL HIGH (ref 9.2–12.3)

## 2012-08-21 ENCOUNTER — Ambulatory Visit: Payer: Self-pay

## 2012-08-21 DIAGNOSIS — E8809 Other disorders of plasma-protein metabolism, not elsewhere classified: Secondary | ICD-10-CM

## 2012-08-21 NOTE — Patient Instructions (Signed)
Spoke with Jon [name] and [name] was on bactrim for multiple bee stings.

## 2012-08-25 ENCOUNTER — Other Ambulatory Visit: Payer: Self-pay | Admitting: Primary Care

## 2012-08-29 ENCOUNTER — Ambulatory Visit: Payer: Self-pay

## 2012-08-29 ENCOUNTER — Ambulatory Visit
Admit: 2012-08-29 | Discharge: 2012-08-29 | Disposition: A | Discharge status: Home / Self Care | Payer: Self-pay | Source: Ambulatory Visit | Attending: Primary Care | Admitting: Primary Care

## 2012-08-29 DIAGNOSIS — E8809 Other disorders of plasma-protein metabolism, not elsewhere classified: Secondary | ICD-10-CM

## 2012-08-29 LAB — PROTIME-INR
INR: 1.5 — ABNORMAL HIGH (ref 1.0–1.2)
Protime: 15.5 s — ABNORMAL HIGH (ref 9.2–12.3)

## 2012-08-29 NOTE — Patient Instructions (Signed)
i called and spoke with patient regarding INR and dosing he voiced understanding

## 2012-09-01 ENCOUNTER — Ambulatory Visit
Admit: 2012-09-01 | Discharge: 2012-09-01 | Disposition: A | Discharge status: Home / Self Care | Payer: Self-pay | Source: Ambulatory Visit | Attending: Primary Care | Admitting: Primary Care

## 2012-09-01 ENCOUNTER — Ambulatory Visit: Payer: Self-pay

## 2012-09-01 DIAGNOSIS — E8809 Other disorders of plasma-protein metabolism, not elsewhere classified: Secondary | ICD-10-CM

## 2012-09-01 DIAGNOSIS — D6859 Other primary thrombophilia: Secondary | ICD-10-CM

## 2012-09-01 LAB — PROTIME-INR
INR: 2.3 — ABNORMAL HIGH (ref 1.0–1.2)
Protime: 24.3 s — ABNORMAL HIGH (ref 9.2–12.3)

## 2012-09-01 NOTE — Patient Instructions (Signed)
i called and spoke with spouse regarding INR and dosing, she voiced understanding and read it back to me.

## 2012-09-18 ENCOUNTER — Ambulatory Visit
Admit: 2012-09-18 | Discharge: 2012-09-18 | Disposition: A | Discharge status: Home / Self Care | Payer: Self-pay | Source: Ambulatory Visit | Attending: Primary Care | Admitting: Primary Care

## 2012-09-18 DIAGNOSIS — D6859 Other primary thrombophilia: Secondary | ICD-10-CM

## 2012-09-18 LAB — PROTIME-INR
INR: 2.9 — ABNORMAL HIGH (ref 1.0–1.2)
Protime: 31 s — ABNORMAL HIGH (ref 9.2–12.3)

## 2012-09-19 ENCOUNTER — Ambulatory Visit: Payer: Self-pay

## 2012-09-19 DIAGNOSIS — E8809 Other disorders of plasma-protein metabolism, not elsewhere classified: Secondary | ICD-10-CM

## 2012-09-19 NOTE — Patient Instructions (Signed)
Spoke with Jon [name] and reviewed results.

## 2012-10-01 ENCOUNTER — Telehealth: Payer: Self-pay | Admitting: Primary Care

## 2012-10-01 DIAGNOSIS — E119 Type 2 diabetes mellitus without complications: Secondary | ICD-10-CM

## 2012-10-01 DIAGNOSIS — Z139 Encounter for screening, unspecified: Secondary | ICD-10-CM

## 2012-10-01 NOTE — Telephone Encounter (Signed)
Hemoglobin a1c, vit b 12, lipid probably need to be entered, please check with sg enter and call the patient if sg agrees.  appt 10/23

## 2012-10-01 NOTE — Telephone Encounter (Signed)
Orders signed. Thanks.

## 2012-10-02 ENCOUNTER — Encounter: Payer: Self-pay | Admitting: Gastroenterology

## 2012-10-02 NOTE — Telephone Encounter (Signed)
Spoke with Jon [name] and asked him to get blood work done 2-3 days prior to appointment, he voiced understanding

## 2012-10-06 ENCOUNTER — Ambulatory Visit
Admit: 2012-10-06 | Discharge: 2012-10-06 | Disposition: A | Discharge status: Home / Self Care | Payer: Self-pay | Source: Ambulatory Visit | Attending: Primary Care | Admitting: Primary Care

## 2012-10-06 ENCOUNTER — Ambulatory Visit: Payer: Self-pay

## 2012-10-06 DIAGNOSIS — Z139 Encounter for screening, unspecified: Secondary | ICD-10-CM

## 2012-10-06 DIAGNOSIS — E119 Type 2 diabetes mellitus without complications: Secondary | ICD-10-CM

## 2012-10-06 DIAGNOSIS — E8809 Other disorders of plasma-protein metabolism, not elsewhere classified: Secondary | ICD-10-CM

## 2012-10-06 DIAGNOSIS — D6859 Other primary thrombophilia: Secondary | ICD-10-CM

## 2012-10-06 LAB — HEMOGLOBIN A1C: Hemoglobin A1C: 9.1 % — ABNORMAL HIGH (ref 4.0–6.0)

## 2012-10-06 LAB — PROTIME-INR
INR: 2.8 — ABNORMAL HIGH (ref 1.0–1.2)
Protime: 29.6 s — ABNORMAL HIGH (ref 9.2–12.3)

## 2012-10-06 LAB — VITAMIN B12: Vitamin B12: 974 pg/mL — ABNORMAL HIGH (ref 211–946)

## 2012-10-06 NOTE — Patient Instructions (Signed)
Spoke with Aurea Graff and reviewed results with her and she will let [name] know.

## 2012-10-08 ENCOUNTER — Encounter: Payer: Self-pay | Admitting: Primary Care

## 2012-10-08 ENCOUNTER — Ambulatory Visit: Payer: Self-pay | Admitting: Primary Care

## 2012-10-08 VITALS — BP 132/72 | HR 66 | Wt 222.0 lb

## 2012-10-08 DIAGNOSIS — E7800 Pure hypercholesterolemia, unspecified: Secondary | ICD-10-CM

## 2012-10-08 DIAGNOSIS — G3184 Mild cognitive impairment, so stated: Secondary | ICD-10-CM

## 2012-10-08 DIAGNOSIS — D6859 Other primary thrombophilia: Secondary | ICD-10-CM

## 2012-10-08 DIAGNOSIS — I639 Cerebral infarction, unspecified: Secondary | ICD-10-CM

## 2012-10-08 DIAGNOSIS — E119 Type 2 diabetes mellitus without complications: Secondary | ICD-10-CM

## 2012-10-08 DIAGNOSIS — I10 Essential (primary) hypertension: Secondary | ICD-10-CM

## 2012-10-08 DIAGNOSIS — Z23 Encounter for immunization: Secondary | ICD-10-CM

## 2012-10-08 DIAGNOSIS — Z0000 Encounter for general adult medical examination without abnormal findings: Secondary | ICD-10-CM

## 2012-10-08 LAB — PCMH FALL RISK PLAN

## 2012-10-08 LAB — PCMH FALL RISK ASSESSMENT

## 2012-10-08 MED ORDER — ONETOUCH ULTRA CONTROL VI SOLN
Status: AC
Start: 2012-10-08 — End: ?

## 2012-10-08 MED ORDER — GLIMEPIRIDE 2 MG PO TABS *I*
2.0000 mg | ORAL_TABLET | Freq: Every day | ORAL | Status: DC
Start: 2012-10-08 — End: 2012-11-20

## 2012-10-08 NOTE — Progress Notes (Signed)
Patient is in for followup of his AODM, HTN and hyperlipidemia.  He also has an underlying protein S deficiency for which she remains on Coumadin with a fairly stable INR.  His wife underwent TKR surgery so he has been doing a fair amount of nursing work and feels that this lends itself easily to eating more.  His A1c is up to 9.1.  Reviewing the A1c is over the last 2 years he is has never been less than 8.  He has no other complaints at this point in time continues to golf and is otherwise feeling well.  He is planning to winter down in [facility] and will need a new glucometer and medications refilled to accommodate his time down there which is generally on the order of 6 months.  He is due for his colonoscopy this November but he blacked hold off given the care of his wife and its demands presently.  He plans on getting a colonoscopy done when he returns from [place] next May.    PE: BP is 130/70 pulse 66 weight 222 BMI 31.  The remainder of the exam is unchanged from his annual exam and last June.  Vibratory still remains 8-9 seconds.  The labs are reviewed as above.  His B12 is now normal at 900 on replacement.  His medications were reviewed reconciled and we will add Amaryl to his regimen see below.  Depression    A/P #1 AODM-he's had the nearly adequate control with A1c is between 8-1/2 over the last 2 years on maximum doses of metformin and then the addition of sitagliptin.  His weight has been more or less stable and we keep waiting for that 10 pounds to disappear.  I've recommended adding Amaryl 2 mg and rechecking the A1c in February down in [place] and will titrate accordingly.  The RBA of the drugs discussed.  I made it clear that were simply adding something and wrote a note for his wife who was not able to be present during the exam and interview her she usually is.  #2 hyperlipidemia-Pravachol  #3 protein C deficiency-continue Coumadin  Number for CVA with residual MCI-continue  Aricept and Namenda.  Other issues per prior note  Lab in February with call and return visit for annual exam in May.

## 2012-10-14 ENCOUNTER — Encounter: Payer: Self-pay | Admitting: Primary Care

## 2012-10-15 ENCOUNTER — Other Ambulatory Visit: Payer: Self-pay | Admitting: Primary Care

## 2012-10-16 ENCOUNTER — Other Ambulatory Visit: Payer: Self-pay | Admitting: Primary Care

## 2012-10-16 MED ORDER — ONETOUCH ULTRA SYSTEM W/DEVICE KIT *A*
PACK | Status: DC
Start: 2012-10-16 — End: 2015-05-10

## 2012-10-16 MED ORDER — GLUCOSE BLOOD VI STRP *A*
ORAL_STRIP | Status: DC
Start: 2012-10-16 — End: 2012-10-17

## 2012-10-17 ENCOUNTER — Other Ambulatory Visit: Payer: Self-pay | Admitting: Primary Care

## 2012-10-17 MED ORDER — GLUCOSE BLOOD VI STRP *A*
ORAL_STRIP | Status: DC
Start: 2012-10-17 — End: 2015-05-10

## 2012-10-20 ENCOUNTER — Other Ambulatory Visit: Payer: Self-pay | Admitting: Primary Care

## 2012-10-20 MED ORDER — ONETOUCH ULTRASOFT LANCETS MISC *A*
Status: DC
Start: 2012-10-20 — End: 2012-10-21

## 2012-10-21 ENCOUNTER — Other Ambulatory Visit: Payer: Self-pay | Admitting: Primary Care

## 2012-10-21 MED ORDER — ONETOUCH ULTRASOFT LANCETS MISC *A*
Status: DC
Start: 2012-10-21 — End: 2014-12-01

## 2012-10-25 ENCOUNTER — Other Ambulatory Visit: Payer: Self-pay | Admitting: Primary Care

## 2012-11-08 ENCOUNTER — Other Ambulatory Visit: Payer: Self-pay | Admitting: Primary Care

## 2012-11-11 ENCOUNTER — Other Ambulatory Visit: Payer: Self-pay | Admitting: Primary Care

## 2012-11-11 DIAGNOSIS — E119 Type 2 diabetes mellitus without complications: Secondary | ICD-10-CM

## 2012-11-11 MED ORDER — METFORMIN HCL 1000 MG PO TABS *I*
ORAL_TABLET | ORAL | Status: DC
Start: 2012-11-11 — End: 2012-11-20

## 2012-11-17 ENCOUNTER — Ambulatory Visit
Admit: 2012-11-17 | Discharge: 2012-11-17 | Disposition: A | Discharge status: Home / Self Care | Payer: Self-pay | Source: Ambulatory Visit | Attending: Primary Care | Admitting: Primary Care

## 2012-11-17 DIAGNOSIS — D6859 Other primary thrombophilia: Secondary | ICD-10-CM

## 2012-11-17 LAB — PROTIME-INR
INR: 2.3 — ABNORMAL HIGH (ref 1.0–1.2)
Protime: 24.2 s — ABNORMAL HIGH (ref 9.2–12.3)

## 2012-11-18 ENCOUNTER — Ambulatory Visit: Payer: Self-pay

## 2012-11-18 DIAGNOSIS — E8809 Other disorders of plasma-protein metabolism, not elsewhere classified: Secondary | ICD-10-CM

## 2012-11-18 NOTE — Patient Instructions (Signed)
Spoke with [name] and reviewed results.

## 2012-11-20 ENCOUNTER — Other Ambulatory Visit: Payer: Self-pay | Admitting: Primary Care

## 2012-11-20 DIAGNOSIS — I10 Essential (primary) hypertension: Secondary | ICD-10-CM

## 2012-11-20 MED ORDER — GLIMEPIRIDE 2 MG PO TABS *I*
2.0000 mg | ORAL_TABLET | Freq: Every day | ORAL | Status: AC
Start: 2012-11-20 — End: 2013-09-04

## 2012-11-20 MED ORDER — DONEPEZIL HCL 10 MG PO TABS *I*
ORAL_TABLET | ORAL | Status: DC
Start: 2012-11-20 — End: 2014-12-01

## 2012-11-20 MED ORDER — METFORMIN HCL 1000 MG PO TABS *I*
ORAL_TABLET | ORAL | Status: DC
Start: 2012-11-20 — End: 2014-02-01

## 2012-11-20 MED ORDER — SITAGLIPTIN PHOSPHATE 100 MG PO TABS *I*
ORAL_TABLET | ORAL | Status: DC
Start: 2012-11-20 — End: 2014-05-17

## 2012-11-20 MED ORDER — COUMADIN 2.5 MG PO TABS
ORAL_TABLET | ORAL | Status: DC
Start: 2012-11-20 — End: 2014-05-07

## 2012-11-20 MED ORDER — PRAVASTATIN SODIUM 40 MG PO TABS *I*
40.0000 mg | ORAL_TABLET | Freq: Every day | ORAL | Status: AC
Start: 2012-11-20 — End: 2013-09-04

## 2012-11-20 MED ORDER — FINASTERIDE 5 MG PO TABS *I*
ORAL_TABLET | ORAL | Status: DC
Start: 2012-11-20 — End: 2012-11-26

## 2012-11-20 MED ORDER — GABAPENTIN 300 MG PO CAPS
ORAL_CAPSULE | ORAL | Status: DC
Start: 2012-11-20 — End: 2014-12-01

## 2012-11-20 MED ORDER — MELOXICAM 7.5 MG PO TABS *I*
ORAL_TABLET | ORAL | Status: DC
Start: 2012-11-20 — End: 2013-09-08

## 2012-11-20 MED ORDER — MEMANTINE HCL 10 MG PO TABS *I*
ORAL_TABLET | ORAL | Status: DC
Start: 2012-11-20 — End: 2014-12-01

## 2012-11-20 NOTE — Telephone Encounter (Signed)
Pt will need scripts printed and mailed to him to take to [place] for the holiday. He will also need a printed order for his INR mailed to him. Thank You

## 2012-11-24 ENCOUNTER — Telehealth: Payer: Self-pay | Admitting: Primary Care

## 2012-11-24 DIAGNOSIS — D6859 Other primary thrombophilia: Secondary | ICD-10-CM

## 2012-11-24 LAB — HM DIABETES EYE EXAM

## 2012-11-24 NOTE — Telephone Encounter (Signed)
Would like standing order printed so he can take [facility] with him.  Ordered and printed and mailed to pt.

## 2012-11-26 ENCOUNTER — Telehealth: Payer: Self-pay | Admitting: Urology

## 2012-11-26 ENCOUNTER — Other Ambulatory Visit: Payer: Self-pay | Admitting: Primary Care

## 2012-11-26 ENCOUNTER — Other Ambulatory Visit: Payer: Self-pay | Admitting: Urology

## 2012-11-26 ENCOUNTER — Encounter: Payer: Self-pay | Admitting: Primary Care

## 2012-11-26 MED ORDER — FINASTERIDE 5 MG PO TABS *I*
ORAL_TABLET | ORAL | Status: DC
Start: 2012-11-26 — End: 2014-08-11

## 2012-11-26 MED ORDER — FLUOCINONIDE-E 0.05 % EX CREA
TOPICAL_CREAM | Freq: Two times a day (BID) | CUTANEOUS | Status: DC
Start: 2012-11-26 — End: 2015-09-19

## 2012-11-26 MED ORDER — FINASTERIDE 5 MG PO TABS *I*
ORAL_TABLET | ORAL | Status: DC
Start: 2012-11-26 — End: 2013-09-04

## 2012-11-26 NOTE — Telephone Encounter (Signed)
Patient called is going to [facility] for the winter would like to take scripts with him, sanctura 60mg  and also the cream for his penis, he cant remember the name of it, they are leaving on 12.21.13

## 2012-11-26 NOTE — Telephone Encounter (Signed)
Done - I'll give them to you - please notify pt

## 2012-12-01 ENCOUNTER — Other Ambulatory Visit: Payer: Self-pay | Admitting: Primary Care

## 2012-12-01 MED ORDER — TROSPIUM CHLORIDE 60 MG PO CP24 *A*
60.0000 mg | ORAL_CAPSULE | Freq: Every day | ORAL | Status: DC
Start: 2012-12-01 — End: 2014-06-28

## 2012-12-01 NOTE — Telephone Encounter (Signed)
Terrie's wife Carney Bern called and asked if you would be able to refill his [name], initially prescribed by Dr. Alois Cliche. She says that Dr. Alois Cliche would like for you to refill it for him during the winter months. It needs to be printed for them to take to [place] with them if you decide to. Thank You

## 2012-12-02 ENCOUNTER — Other Ambulatory Visit: Payer: Self-pay | Admitting: Primary Care

## 2012-12-30 ENCOUNTER — Telehealth: Payer: Self-pay | Admitting: Primary Care

## 2012-12-30 NOTE — Telephone Encounter (Signed)
Spoke with Jon [name] and he states that he is on his way back to [facility] and will get it checked when he arrives.

## 2013-01-01 ENCOUNTER — Telehealth: Payer: Self-pay | Admitting: Primary Care

## 2013-01-01 ENCOUNTER — Encounter: Payer: Self-pay | Admitting: Primary Care

## 2013-01-01 ENCOUNTER — Ambulatory Visit: Payer: Self-pay

## 2013-01-01 DIAGNOSIS — D6859 Other primary thrombophilia: Secondary | ICD-10-CM

## 2013-01-01 DIAGNOSIS — E8809 Other disorders of plasma-protein metabolism, not elsewhere classified: Secondary | ICD-10-CM

## 2013-01-01 LAB — PROTIME-INR: INR: 1.45

## 2013-01-01 NOTE — Telephone Encounter (Signed)
Lab from [facility] called, [name] can't find his standing order, so they are requesting [place] to fax one.  Will fax. To number [phone]

## 2013-01-01 NOTE — Patient Instructions (Signed)
[name] is down in [place], [place], reviewed results and he will recheck INR in one week.

## 2013-01-09 ENCOUNTER — Telehealth: Payer: Self-pay | Admitting: Primary Care

## 2013-01-09 NOTE — Telephone Encounter (Signed)
Spoke with [name] and he had done yesterday.  Will look for results.

## 2013-01-13 NOTE — Telephone Encounter (Signed)
Spoke with [name] and asked him to have lab fax results he didn't know phone number and it is icy where he is.  Will fax request for lab to fax office results.

## 2013-01-15 NOTE — Telephone Encounter (Signed)
Have not received fax from lab yet, called [name] and he states that he will go tomorrow to get INR done again, did ask him to just get the results first but he insisted he would get another one done tomorrow.

## 2013-01-16 ENCOUNTER — Encounter: Payer: Self-pay | Admitting: Primary Care

## 2013-01-16 ENCOUNTER — Ambulatory Visit: Payer: Self-pay

## 2013-01-16 DIAGNOSIS — E8809 Other disorders of plasma-protein metabolism, not elsewhere classified: Secondary | ICD-10-CM

## 2013-01-16 LAB — PROTIME-INR: INR: 1.84

## 2013-01-16 NOTE — Telephone Encounter (Signed)
Received fax from lab in [facility] and INR was addressed with [name].

## 2013-01-16 NOTE — Patient Instructions (Signed)
Spoke with [name] and lab sent results today, he is to recheck again in one month

## 2013-02-10 ENCOUNTER — Telehealth: Payer: Self-pay | Admitting: Primary Care

## 2013-02-10 NOTE — Telephone Encounter (Signed)
Left message on voice mail to have INR done and to have lab fax results to our office.  He is currently in [facility].

## 2013-02-16 ENCOUNTER — Encounter: Payer: Self-pay | Admitting: Gastroenterology

## 2013-02-16 ENCOUNTER — Encounter: Payer: Self-pay | Admitting: Primary Care

## 2013-02-16 LAB — HEMOGLOBIN A1C: Hemoglobin A1C: 7.1

## 2013-02-16 LAB — PROTIME-INR: INR: 2.11

## 2013-02-16 NOTE — Telephone Encounter (Signed)
Spoke with [name] and he stated he got it done about 5 minutes ago.  Will watch fax machine for results.

## 2013-02-17 ENCOUNTER — Ambulatory Visit: Payer: Self-pay

## 2013-02-17 NOTE — Patient Instructions (Signed)
Spoke with [name] and reviewed INR results with him.

## 2013-02-17 NOTE — Telephone Encounter (Signed)
[name]'s INR addressed, see flowsheet.

## 2013-03-19 ENCOUNTER — Telehealth: Payer: Self-pay | Admitting: Primary Care

## 2013-03-19 NOTE — Telephone Encounter (Signed)
Spoke with [name] and he states he will get it done on Monday when he gets back to [facility].

## 2013-03-24 NOTE — Telephone Encounter (Signed)
Left message on voice mail to have INR done.

## 2013-03-25 LAB — PROTIME-INR: INR: 1.87

## 2013-03-26 ENCOUNTER — Ambulatory Visit: Payer: Self-pay

## 2013-03-26 ENCOUNTER — Encounter: Payer: Self-pay | Admitting: Primary Care

## 2013-03-26 DIAGNOSIS — E8809 Other disorders of plasma-protein metabolism, not elsewhere classified: Secondary | ICD-10-CM

## 2013-03-26 NOTE — Telephone Encounter (Signed)
Jon [name] had INR done and it was addressed.

## 2013-03-26 NOTE — Patient Instructions (Signed)
Left message on voice mail with INR results and to have rechecked again in 2 weeks.

## 2013-04-13 ENCOUNTER — Telehealth: Payer: Self-pay | Admitting: Primary Care

## 2013-04-13 DIAGNOSIS — D6859 Other primary thrombophilia: Secondary | ICD-10-CM

## 2013-04-13 NOTE — Telephone Encounter (Signed)
Spoke with [name] and he states he had it done, but no results were faxed to [place].  He states he is in [place] and on his way home and would like to do his INR when he gets back to [place].  Standing order entered

## 2013-04-13 NOTE — Telephone Encounter (Signed)
Standing INR order signed.  Thanks.

## 2013-04-20 ENCOUNTER — Ambulatory Visit
Admit: 2013-04-20 | Discharge: 2013-04-20 | Disposition: A | Discharge status: Home / Self Care | Payer: Self-pay | Source: Ambulatory Visit | Attending: Primary Care | Admitting: Primary Care

## 2013-04-20 DIAGNOSIS — D6859 Other primary thrombophilia: Secondary | ICD-10-CM

## 2013-04-20 LAB — PROTIME-INR
INR: 2.2 — ABNORMAL HIGH (ref 1.0–1.2)
Protime: 23.6 s — ABNORMAL HIGH (ref 9.2–12.3)

## 2013-04-21 ENCOUNTER — Ambulatory Visit: Payer: Self-pay

## 2013-04-21 NOTE — Patient Instructions (Signed)
Left message on voice mail INR results and to get rechecked in one month

## 2013-04-22 ENCOUNTER — Other Ambulatory Visit: Payer: Self-pay | Admitting: Primary Care

## 2013-04-29 ENCOUNTER — Ambulatory Visit
Admit: 2013-04-29 | Discharge: 2013-04-29 | Disposition: A | Discharge status: Home / Self Care | Payer: Self-pay | Source: Ambulatory Visit | Attending: Primary Care | Admitting: Primary Care

## 2013-04-29 ENCOUNTER — Telehealth: Payer: Self-pay | Admitting: Primary Care

## 2013-04-29 DIAGNOSIS — Z0000 Encounter for general adult medical examination without abnormal findings: Secondary | ICD-10-CM

## 2013-04-29 DIAGNOSIS — E7800 Pure hypercholesterolemia, unspecified: Secondary | ICD-10-CM

## 2013-04-29 DIAGNOSIS — Z23 Encounter for immunization: Secondary | ICD-10-CM

## 2013-04-29 DIAGNOSIS — G3184 Mild cognitive impairment, so stated: Secondary | ICD-10-CM

## 2013-04-29 DIAGNOSIS — I10 Essential (primary) hypertension: Secondary | ICD-10-CM

## 2013-04-29 DIAGNOSIS — I639 Cerebral infarction, unspecified: Secondary | ICD-10-CM

## 2013-04-29 DIAGNOSIS — E119 Type 2 diabetes mellitus without complications: Secondary | ICD-10-CM

## 2013-04-29 DIAGNOSIS — D6859 Other primary thrombophilia: Secondary | ICD-10-CM

## 2013-04-29 LAB — CBC
Hematocrit: 42 % (ref 40–51)
Hemoglobin: 13.7 g/dL (ref 13.7–17.5)
MCV: 91 fL (ref 79–92)
Platelets: 128 10*3/uL — ABNORMAL LOW (ref 150–330)
RBC: 4.6 MIL/uL (ref 4.6–6.1)
RDW: 13.5 % (ref 11.6–14.4)
WBC: 5.8 10*3/uL (ref 4.2–9.1)

## 2013-04-29 LAB — URINALYSIS WITH MICROSCOPIC
Blood,UA: NEGATIVE
Glucose,UA: 50 mg/dL — AB
Hyaline Casts,UA: 1 /lpf (ref 0–2)
Ketones, UA: NEGATIVE
Leuk Esterase,UA: NEGATIVE
Nitrite,UA: NEGATIVE
Protein,UA: NEGATIVE mg/dL
RBC,UA: 1 /hpf (ref 0–2)
Specific Gravity,UA: 1.026 (ref 1.002–1.030)
WBC,UA: 2 /hpf (ref 0–5)
pH,UA: 5 (ref 5.0–8.0)

## 2013-04-29 LAB — COMPREHENSIVE METABOLIC PANEL
ALT: 26 U/L (ref 0–50)
AST: 30 U/L (ref 0–50)
Albumin: 4.3 g/dL (ref 3.5–5.2)
Alk Phos: 62 U/L (ref 40–130)
Anion Gap: 10 (ref 7–16)
Bilirubin,Total: 0.4 mg/dL (ref 0.0–1.2)
CO2: 26 mmol/L (ref 20–28)
Calcium: 9.2 mg/dL (ref 8.6–10.2)
Chloride: 104 mmol/L (ref 96–108)
Creatinine: 1.01 mg/dL (ref 0.67–1.17)
GFR,Black: 82 *
GFR,Caucasian: 71 *
Glucose: 122 mg/dL — ABNORMAL HIGH (ref 60–99)
Lab: 17 mg/dL (ref 6–20)
Potassium: 4.5 mmol/L (ref 3.3–5.1)
Sodium: 140 mmol/L (ref 133–145)
Total Protein: 6.9 g/dL (ref 6.3–7.7)

## 2013-04-29 LAB — PSA (EFF.4-2010): PSA (eff. 4-2010): 0.15 ng/mL (ref 0.00–4.00)

## 2013-04-29 LAB — LIPID PANEL
Chol/HDL Ratio: 3.3
Cholesterol: 150 mg/dL
HDL: 45 mg/dL
LDL Calculated: 74 mg/dL
Non HDL Cholesterol: 105 mg/dL
Triglycerides: 156 mg/dL — AB

## 2013-04-29 LAB — CK: CK: 165 U/L (ref 46–171)

## 2013-04-29 LAB — TSH: TSH: 4.02 u[IU]/mL (ref 0.27–4.20)

## 2013-04-29 LAB — SEDIMENTATION RATE, AUTOMATED: Sedimentation Rate: 10 mm/hr (ref 0–20)

## 2013-04-29 LAB — URIC ACID: Urate: 6.1 mg/dL (ref 3.9–9.0)

## 2013-04-29 LAB — VITAMIN B12: Vitamin B12: 1176 pg/mL — ABNORMAL HIGH (ref 211–946)

## 2013-04-29 NOTE — Telephone Encounter (Signed)
Pt has future labs in the system but has yet to have them drawn. Can you please call and remind Pt? Thank you!!

## 2013-04-29 NOTE — Telephone Encounter (Signed)
Spoke with [name] and he states he will get it done for appointment on "Friday

## 2013-04-30 LAB — MICROALBUMIN, URINE, RANDOM
Creatinine,UR: 243 mg/dL (ref 20–300)
Microalb/Creat Ratio: 3.7 mg MA/g CR (ref 0.0–29.9)
Microalbumin,UR: 0.9 mg/dL

## 2013-04-30 LAB — HEMOGLOBIN A1C: Hemoglobin A1C: 7.4 % — ABNORMAL HIGH (ref 4.0–6.0)

## 2013-05-01 ENCOUNTER — Ambulatory Visit: Payer: Self-pay | Admitting: Primary Care

## 2013-05-01 ENCOUNTER — Encounter: Payer: Self-pay | Admitting: Primary Care

## 2013-05-01 VITALS — BP 120/70 | HR 54 | Ht 71.0 in | Wt 225.0 lb

## 2013-05-01 DIAGNOSIS — N400 Enlarged prostate without lower urinary tract symptoms: Secondary | ICD-10-CM

## 2013-05-01 DIAGNOSIS — F0280 Dementia in other diseases classified elsewhere without behavioral disturbance: Secondary | ICD-10-CM

## 2013-05-01 DIAGNOSIS — D6859 Other primary thrombophilia: Secondary | ICD-10-CM

## 2013-05-01 DIAGNOSIS — Z0000 Encounter for general adult medical examination without abnormal findings: Secondary | ICD-10-CM

## 2013-05-01 DIAGNOSIS — G629 Polyneuropathy, unspecified: Secondary | ICD-10-CM

## 2013-05-01 DIAGNOSIS — M1990 Unspecified osteoarthritis, unspecified site: Secondary | ICD-10-CM

## 2013-05-01 DIAGNOSIS — E119 Type 2 diabetes mellitus without complications: Secondary | ICD-10-CM

## 2013-05-01 DIAGNOSIS — E782 Mixed hyperlipidemia: Secondary | ICD-10-CM

## 2013-05-01 DIAGNOSIS — G301 Alzheimer's disease with late onset: Secondary | ICD-10-CM

## 2013-05-01 LAB — PCMH FALL RISK ASSESSMENT

## 2013-05-01 LAB — HM HIV SCREENING OFFERED

## 2013-05-01 LAB — PCMH FALL RISK PLAN

## 2013-05-01 NOTE — H&P (Signed)
The patient is very pleasant 79 year old white male in for his annual physical.  He is accompanied by his wife.  He has no focal complaints and is feeling well.  He spends the winters down in [place].  This year was somewhat tumultuous because of the diagnosis of one of his twin daughters with cortical spinal dementia at the age of 66.  She's been undergoing a battery of tests and [facility] where she lives so they spent quite a bit of time going back and forth to help expedite living wills power of attorney and other healthcare related issues as well as her ongoing medical workups.  Obviously quite stressful.  Since his last annual exam back in December 2012 we have added Amaryl 2 mg to his regimen.  Up until October 2013 the A1c been consistently above 8 but since adding the Amaryl at that time the A1c has come down and today is 7.4.  He continues to follow with Dr. Alois Cliche for his neuropathic bladder and BPH and the PSA remains quite low.  He has a mild thrombocytopenia which has been present in the past and other than the Amaryl 2 mg in the sitagliptin at 100 mg daily the remainder of the PMH meds allergies social history family history ROS exam are unchanged from the prior note-see below        The patient is a very pleasant 79 year old white male, new to me, in for his annual physical. He is accompanied by his wife who relates a fair amount of a history. He has no acute complaints.   PMH #1 AODM diagnosed 68. Over the last 2 years his A1c is gone from 7.8-8.1-8.3 back in September 2012. He has a complication of a mild neuropathy his been treated with metformin and sitagliptin at 50 mg.   #2 protein S deficiency-at his first clot in 1998 a second clot in 2005 when the diagnosis was made by Dr. Thelma Barge at [facility]. He has postphlebitic syndrome with persistent pain and is on Coumadin and Vicodin 5/500 nightly.   #3 hyperlipidemia-Zocor 40 mg. No CP/SOB/Dysrythmia.   #4 status post CVA in 1993-he  said that difficulty with memory since then and has been on Aricept 10 mg and Namenda 10 twice a day for years.   #5 BPH-followed by Dr. Alois Cliche prior Proscar and now on Sanctura XR 60 mg daily which is not on his current medication list.   #6 back pain-[facility] and has done well   #7 Paget's disease listed on the chart but there's no specific therapy. His wife relates that he has been seen [facility].   Meds: Metformin 1 g by mouth twice a day (2 500 mg tablets twice a day), sitagliptin 50 mg daily, Zocor 40 mg by mouth daily, Aricept 10 mg by mouth daily, Namenda 10 mg by mouth twice a day, gabapentin 300 mg 2 tablets at bedtime, Sanctura XR 60 mg daily, Coumadin 5 mg 2 times a week and 3.75 mg 5 days a week. OTC meds include vitamin D thousand IUs daily, fish oil, and a baby aspirin.   Allergies: NKA   SH: Nonsmoker nondrinker married for 48 years with 4 daughters 2 youngest twins her to girls in [place] 2 local and 1 at home with his teenage granddaughter. Prior Curator. Winter in [place].Big golfer.   FH: Mother died at 59 father died at 2 with PAD and EtOH. There 16 siblings.  ROS #1 partial left knee replacement   #2 history of epistaxis   #3 up to date colonoscopy   #4 up-to-date vaccines Pneumovax 2010, tetanus 2006.   PE: Very pleasant 79 year old in NAD: BP 120/60, pulse 56 and regular, 5 foot 11 inches 225 pounds for BMI of 31.3. HEENT: Within normal limits Neck: Supple, no lymphadenopathy, normal thyroid. Lungs: Clear Cor: S1-S2 no MGR Abdomen: Soft, benign Extremities: No CCE. Neuro: Cranial nerves, motor, sensory, cerebellar and gait all within normal limits. Vibratory sensation to 8 seconds bilaterally.   Labs: EKG reveals sinus arrhythmia with first degree AV block. There were no labs done in anticipation of today's visit.   A/P #52 AODM 79 year old-inadequate control with A1c of 8.3 and in that range for the last year and a half.  Recommend increase sitagliptin to 100 mg daily based on the prior renal function with a normal GFR. He spends his winter in [place] and will check an A1c in March.   #2 protein S deficiency-continue Coumadin recent therapeutic INR. Regular monthly checks while down in [place].   #3 hyperlipidemia, BPH, MCI secondary to CVA, back pain, Paget's, and other multiple issues as above.   #4 wellness-PSA and yearly DRE with Dr. Alois Cliche, up to date colonoscopy, up-to-date vaccines. Recommend a eye and foot care as well as annual skin evaluation. OTC nutraceuticals discussed. All meds renewed and printed for trip [place] as well as standing order for INR and an A1c in March with return visit in 6 months here.        Electronically signed by Jon Erp, MD at 11/27/2011 10:28 AM     Labs: CBC with platelets 128,000 CMP is unremarkable with a glucose of 120 and an A1c of 7.4 the microalbumin is negative despite a UA with some protein.  Lipid panel is at goal TSH B12 UA CPK uric acid sed rate and PSA are all unremarkable.  Vitamin D is still pending.    A/P #1 AODM-numbers at goal finally with 3 drug therapy continue same  #2 protein S deficiency-continue Coumadin.  His INR is been stable for the last year.  #3 hyperlipidemia-numbers at goal with Pravachol 40 mg daily.  #4 SDAT versus vascular dementia-continue Aricept and Namenda.  Prior neuro psych evaluation.  #5 neuropathic bladder with BPH-continue followup with urology  #6 status post GB/knee replacement/skin graft her prior surgeries/Paget's disease and other issues per prior note  #7 wellness-PSA and yearly DRE is with Dr. Alois Cliche in up-to-date.  He gets regular eye and foot care.  Discussed dermatologic care as he has several cutaneous horns on the right temple and the scalp.  Vaccines are up to date pneumococcal vaccine 2010 and tetanus 2006.  He was due for colonoscopy November 2013 selected to wait a year or 2 and given the stresses of his  daughters recent diagnosis of seems reasonable.  Return visit 4 months repeat A1c

## 2013-05-04 LAB — VITAMIN D
25-OH VIT D2: <4 ng/mL
25-OH VIT D3: 37 ng/mL
25-OH Vit Total: 37 ng/mL (ref 30–60)

## 2013-05-05 ENCOUNTER — Encounter: Payer: Self-pay | Admitting: Urology

## 2013-05-29 ENCOUNTER — Ambulatory Visit
Admit: 2013-05-29 | Discharge: 2013-05-29 | Disposition: A | Discharge status: Home / Self Care | Payer: Self-pay | Source: Ambulatory Visit | Attending: Primary Care | Admitting: Primary Care

## 2013-05-29 ENCOUNTER — Telehealth: Payer: Self-pay | Admitting: Primary Care

## 2013-05-29 DIAGNOSIS — D6859 Other primary thrombophilia: Secondary | ICD-10-CM

## 2013-05-29 LAB — PROTIME-INR
INR: 2.3 — ABNORMAL HIGH (ref 1.0–1.2)
Protime: 25.4 s — ABNORMAL HIGH (ref 9.2–12.3)

## 2013-05-29 NOTE — Telephone Encounter (Signed)
Spoke with [name] and Aurea Graff and they stated [name] would get it done.

## 2013-06-01 ENCOUNTER — Ambulatory Visit: Payer: Self-pay

## 2013-06-01 DIAGNOSIS — E8809 Other disorders of plasma-protein metabolism, not elsewhere classified: Secondary | ICD-10-CM

## 2013-06-01 NOTE — Telephone Encounter (Signed)
[name] had INR done and it was addressed, see flow sheet.

## 2013-06-01 NOTE — Patient Instructions (Addendum)
Called [name] and left message on voice with INR results and to have INR rechecked again in one month on July 16th.

## 2013-07-06 ENCOUNTER — Encounter: Payer: Self-pay | Admitting: Gastroenterology

## 2013-07-06 ENCOUNTER — Telehealth: Payer: Self-pay | Admitting: Primary Care

## 2013-07-06 NOTE — Telephone Encounter (Signed)
Left message on personal  Voice mail to have INR done today.

## 2013-07-16 ENCOUNTER — Other Ambulatory Visit: Payer: Self-pay | Admitting: Primary Care

## 2013-07-16 NOTE — Telephone Encounter (Signed)
Spoke with wife and she states she will have [name] go tomorrow am.

## 2013-07-20 ENCOUNTER — Ambulatory Visit
Admit: 2013-07-20 | Discharge: 2013-07-20 | Disposition: A | Discharge status: Home / Self Care | Payer: Self-pay | Source: Ambulatory Visit | Attending: Primary Care | Admitting: Primary Care

## 2013-07-20 ENCOUNTER — Ambulatory Visit: Payer: Self-pay

## 2013-07-20 DIAGNOSIS — F0280 Dementia in other diseases classified elsewhere without behavioral disturbance: Secondary | ICD-10-CM

## 2013-07-20 DIAGNOSIS — G301 Alzheimer's disease with late onset: Secondary | ICD-10-CM

## 2013-07-20 DIAGNOSIS — Z0000 Encounter for general adult medical examination without abnormal findings: Secondary | ICD-10-CM

## 2013-07-20 DIAGNOSIS — E119 Type 2 diabetes mellitus without complications: Secondary | ICD-10-CM

## 2013-07-20 DIAGNOSIS — D6859 Other primary thrombophilia: Secondary | ICD-10-CM

## 2013-07-20 DIAGNOSIS — E782 Mixed hyperlipidemia: Secondary | ICD-10-CM

## 2013-07-20 DIAGNOSIS — N400 Enlarged prostate without lower urinary tract symptoms: Secondary | ICD-10-CM

## 2013-07-20 DIAGNOSIS — M1990 Unspecified osteoarthritis, unspecified site: Secondary | ICD-10-CM

## 2013-07-20 DIAGNOSIS — E8809 Other disorders of plasma-protein metabolism, not elsewhere classified: Secondary | ICD-10-CM

## 2013-07-20 DIAGNOSIS — G629 Polyneuropathy, unspecified: Secondary | ICD-10-CM

## 2013-07-20 LAB — COMPREHENSIVE METABOLIC PANEL
ALT: 33 U/L (ref 0–50)
AST: 30 U/L (ref 0–50)
Albumin: 4.3 g/dL (ref 3.5–5.2)
Alk Phos: 64 U/L (ref 40–130)
Anion Gap: 10 (ref 7–16)
Bilirubin,Total: 0.4 mg/dL (ref 0.0–1.2)
CO2: 24 mmol/L (ref 20–28)
Calcium: 8.9 mg/dL (ref 8.6–10.2)
Chloride: 104 mmol/L (ref 96–108)
Creatinine: 0.91 mg/dL (ref 0.67–1.17)
GFR,Black: 93 *
GFR,Caucasian: 81 *
Glucose: 177 mg/dL — ABNORMAL HIGH (ref 60–99)
Lab: 20 mg/dL (ref 6–20)
Potassium: 4.6 mmol/L (ref 3.3–5.1)
Sodium: 138 mmol/L (ref 133–145)
Total Protein: 6.8 g/dL (ref 6.3–7.7)

## 2013-07-20 LAB — LIPID PANEL
Chol/HDL Ratio: 3.9
Cholesterol: 157 mg/dL
HDL: 40 mg/dL
LDL Calculated: 75 mg/dL
Non HDL Cholesterol: 117 mg/dL
Triglycerides: 211 mg/dL — AB

## 2013-07-20 LAB — CBC
Hematocrit: 42 % (ref 40–51)
Hemoglobin: 14 g/dL (ref 13.7–17.5)
MCV: 92 fL (ref 79–92)
Platelets: 126 10*3/uL — ABNORMAL LOW (ref 150–330)
RBC: 4.6 MIL/uL (ref 4.6–6.1)
RDW: 13.7 % (ref 11.6–14.4)
WBC: 6.5 10*3/uL (ref 4.2–9.1)

## 2013-07-20 LAB — HEMOGLOBIN A1C: Hemoglobin A1C: 7.3 % — ABNORMAL HIGH (ref 4.0–6.0)

## 2013-07-20 LAB — CK: CK: 115 U/L (ref 46–171)

## 2013-07-20 LAB — PROTIME-INR
INR: 2.6 — ABNORMAL HIGH (ref 1.0–1.2)
Protime: 29.1 s — ABNORMAL HIGH (ref 9.2–12.3)

## 2013-07-20 NOTE — Patient Instructions (Signed)
Left message on voice mail with INR results and to go and have INR repeated on 8/1.

## 2013-07-20 NOTE — Telephone Encounter (Signed)
INR done and addressed, see flow sheet

## 2013-08-03 ENCOUNTER — Ambulatory Visit: Payer: Self-pay | Admitting: Urology

## 2013-08-11 ENCOUNTER — Other Ambulatory Visit: Payer: Self-pay | Admitting: Primary Care

## 2013-08-11 ENCOUNTER — Ambulatory Visit: Payer: Self-pay | Admitting: Urology

## 2013-08-19 ENCOUNTER — Ambulatory Visit: Payer: Self-pay | Admitting: Urology

## 2013-08-20 ENCOUNTER — Ambulatory Visit: Payer: Self-pay

## 2013-08-20 ENCOUNTER — Telehealth: Payer: Self-pay | Admitting: Primary Care

## 2013-08-20 ENCOUNTER — Ambulatory Visit
Admit: 2013-08-20 | Discharge: 2013-08-20 | Disposition: A | Discharge status: Home / Self Care | Payer: Self-pay | Source: Ambulatory Visit | Attending: Primary Care | Admitting: Primary Care

## 2013-08-20 DIAGNOSIS — E8809 Other disorders of plasma-protein metabolism, not elsewhere classified: Secondary | ICD-10-CM

## 2013-08-20 DIAGNOSIS — D6859 Other primary thrombophilia: Secondary | ICD-10-CM

## 2013-08-20 LAB — PROTIME-INR
INR: 3 — ABNORMAL HIGH (ref 1.0–1.2)
Protime: 32.9 s — ABNORMAL HIGH (ref 9.2–12.3)

## 2013-08-20 NOTE — Telephone Encounter (Signed)
INR done and addressed, see flow sheet

## 2013-08-20 NOTE — Telephone Encounter (Signed)
Spoke with Jon [name] and asked her to have [name] have his INR done today and she states she would let him know and maybe this afternoon.

## 2013-08-20 NOTE — Patient Instructions (Signed)
Spoke with [name], and did review INR results with him and will recheck again in one month.

## 2013-08-21 ENCOUNTER — Other Ambulatory Visit: Payer: Self-pay | Admitting: Primary Care

## 2013-08-28 ENCOUNTER — Encounter: Payer: Self-pay | Admitting: Primary Care

## 2013-08-28 LAB — HM DIABETES EYE EXAM

## 2013-09-04 ENCOUNTER — Encounter: Payer: Self-pay | Admitting: Primary Care

## 2013-09-04 ENCOUNTER — Ambulatory Visit: Payer: Self-pay | Admitting: Urology

## 2013-09-04 ENCOUNTER — Ambulatory Visit: Payer: Self-pay

## 2013-09-04 ENCOUNTER — Ambulatory Visit: Payer: Self-pay | Admitting: Primary Care

## 2013-09-04 ENCOUNTER — Encounter: Payer: Self-pay | Admitting: Urology

## 2013-09-04 VITALS — BP 138/72 | HR 58 | Wt 231.0 lb

## 2013-09-04 VITALS — BP 139/67 | HR 72 | Ht 71.0 in | Wt 231.0 lb

## 2013-09-04 DIAGNOSIS — Z23 Encounter for immunization: Secondary | ICD-10-CM

## 2013-09-04 DIAGNOSIS — F068 Other specified mental disorders due to known physiological condition: Secondary | ICD-10-CM

## 2013-09-04 DIAGNOSIS — E119 Type 2 diabetes mellitus without complications: Secondary | ICD-10-CM

## 2013-09-04 DIAGNOSIS — R21 Rash and other nonspecific skin eruption: Secondary | ICD-10-CM | POA: Insufficient documentation

## 2013-09-04 DIAGNOSIS — N400 Enlarged prostate without lower urinary tract symptoms: Secondary | ICD-10-CM

## 2013-09-04 DIAGNOSIS — E8809 Other disorders of plasma-protein metabolism, not elsewhere classified: Secondary | ICD-10-CM

## 2013-09-04 DIAGNOSIS — Z1389 Encounter for screening for other disorder: Secondary | ICD-10-CM

## 2013-09-04 DIAGNOSIS — N489 Disorder of penis, unspecified: Secondary | ICD-10-CM

## 2013-09-04 LAB — POCT URINALYSIS DIPSTICK
Glucose,UA POCT: 50 — AB
Ketones,UA POCT: NEGATIVE
Leuk Esterase,UA POCT: NEGATIVE
Lot #: 22623703
Nitrite,UA POCT: NEGATIVE
PH,UA POCT: 5 (ref 5–8)
Protein,UA POCT: NEGATIVE mg/dL

## 2013-09-04 LAB — HM DIABETES FOOT EXAM

## 2013-09-04 NOTE — Progress Notes (Signed)
[facility] Urology Follow Up Visit    Jon [name] [facility]  09/04/2013    Chief complaint : bph f/u    HPI: occasional urinary frequency and urgency but well controlled on combination regimen.  I reviewed the strengths and weaknesses of PSA as a screening test for prostate cancer. We reviewed the recent controversies surrounding PSA screening and the recent recommendation by the USPSTF that men in this [place] no longer be screened with PSA. We discussed the fact the the AUA has a different feeling about PSA screening and takes issue with the USPSTF findings and recommendations. The patient will consider these issues and has decided that he no longer wishes to have further PSA levels drawn.   He also complains of a recurring rash on the head of his uncircumcised penis which has been intermittent over the past 20 months or so. He retract the foreskin to wash 2 times per week. He reports that it clears for a time and then returns. He never f/u with dermatology referral given to him last year    Changes since prior visit :   PMH - no change  PSH - no change  Medications - no change  Allergies - no change  ROS - no change    Physical Exam :   General appearance - well appearing, nad  Back - no cvat bilaterally  Abdomen - soft, no masses, no hepatosplenomegaly, non-distended bladder, no inguinal hernia, no tenderness  Lymph nodes - no lad  Extremities/pulses -nl  Genitourinary - Male -  Penis -  normal, uncircumcized and moderate papular erythema at corona of penis and head of the glans   Urethral Meatus -  Normal  Scrotum -  Normal  Right and left testes - Normal  Right and left epididymis -  Normal  Anus and Perineum -  normal and No masses    Studies reviewed -   Component      Latest Ref Rng 09/04/2013   PH,UA POCT      5 - 8 5.0   Leuk Esterase,UA POCT      Negative Negative   Nitrite,UA POCT      Negative Negative   Protein,UA POCT      Negative mg/dL Negative   Glucose,UA POCT      Normal 50 mg/dL (A)   Ketones,UA  POCT      Negative Negative   Blood,UA POCT      Negative Trace (A)       Diagnosis established -bph - pt no longer wishes to have PCA screening; dermatitis on head of penis in uncircumcised male recurrent epsiodes - clears with lidex  Plan of care -lidex ceam topically 2x day for one week and refer to dermatology; pt would paper Rx for meds when he goes to [facility] for the winter in December

## 2013-09-04 NOTE — Progress Notes (Signed)
HPI:  Jon [name] is a 79 y.o.male here for f/u of diabetes mellitus    Per patient, feeling well.  Is golfing a lot, "missing the short putts lately".  Has intermittent lower back pain, while gardening, no back pain at night.      Chronic pain in both legs after having DVT's (Protein S deficiency), throbbing in legs.  Using vicodin very infrequently (1-2 times a week)    Anxiety -wife states that he has been having events of being hyper and short with anxiety,  especially with family members, doesn't know what to do.   the patient and wife have been under large amounts of stress, with a daughter in [place] with neurological issues, and in a week and a half her second daughter, who lives in [place], will have an open brain biopsy.  Had therapy with stroke years ago, but didn't "buy into it" - has outlets to talk to (brothers), friends Brett Canales - works w/virtual Physicist, medical).        Review of systems:   Constitutional: negative  Eyes: negative  Ears, nose, mouth, throat, and face: negative  Respiratory: negative  Cardiovascular: negative  Gastrointestinal: negatibve  Genitourinary: still with some leakage, "but I'm drinking a lot"  Integument/breast: negative  Hematologic/lymphatic: negative  Musculoskeletal: positive for infrequent and back and leg pain  Neurologicpositive for memory problems452}  Behavioral/Psych: Emotional outbursts as per the HPI,    Endocrine: negative  Allergic/Immunologic: negative      No past medical history on file.  Past Surgical History   Procedure Laterality Date   . Skin graft     . Joint replacement     . Cholecystectomy     . Knee replacement     . Tonsillectomy         Allergies:   Allergies   Allergen Reactions   . No Known Drug Allergy      Created by Conversion - 0;    . No Known Latex Allergy      Created by Conversion - 0;      No family history on file.  History     Social History   . Marital Status: Married     Spouse Name: N/A     Number of Children: N/A    . Years of Education: N/A     Social History Main Topics   . Smoking status: Never Smoker    . Smokeless tobacco: Never Used   . Alcohol Use: None   . Drug Use: None   . Sexual Activity: None     Other Topics Concern   . None     Social History Narrative   . None       Current Outpatient Prescriptions   Medication Sig Dispense Refill   . donepezil (ARICEPT) 10 MG tablet TAKE ONE TABLET BY MOUTH ONCE DAILY  90 tablet  3   . NAMENDA 10 MG tablet TAKE ONE TABLET BY MOUTH TWICE A DAY  180 tablet  3   . trospium (SANCTURA XR) extended release capsule Take 1 capsule (60 mg total) by mouth daily (before breakfast)  90 capsule  3   . finasteride (PROSCAR) 5 MG tablet TAKE ONE TABLET BY MOUTH ONCE DAILY FOR USE BY MEN ONLY  90 tablet  1   . fluocinonide-emollient (LIDEX-E) 0.05 % cream Apply topically 2 times daily  30 g  2   . donepezil (ARICEPT) 10 MG tablet 1 tab daily  90 tablet  3   . memantine (NAMENDA) 10 MG tablet Take 1 Tablet by mouth Twice a Day  180 tablet  3   . COUMADIN 2.5 MG tablet TAKE TWO TABLETS BY MOUTH ON MONDAY AND THURSDAY. TAKE ONE AND ONE HALF TABLETS REST OF DAYS OR AS DIRECTED  138 tablet  2   . pravastatin (PRAVACHOL) 40 MG tablet Take 1 tablet (40 mg total) by mouth daily  90 tablet  5   . gabapentin (NEURONTIN) 300 MG capsule TAKE ONE CAPSULE BY MOUTH TWICE A DAY  180 capsule  0   . metFORMIN (GLUCOPHAGE) 1000 MG tablet 1 tab BID with food  180 tablet  4   . sitaGLIPtin (JANUVIA) 100 MG tablet TAKE ONE TABLET BY MOUTH EVERY DAY  90 tablet  1   . glimepiride (AMARYL) 2 MG tablet Take 1 tablet (2 mg total) by mouth daily (with breakfast)  90 tablet  3   . meloxicam (MOBIC) 7.5 MG tablet TAKE ONE TABLET BY MOUTH ONCE DAILY  30 tablet  2   . lancets (ONETOUCH ULTRASOFT) Use   2  times per day as instructed for blood glucose testing.  100 each  3   . blood glucose (ONE TOUCH) test strip To be used twice daily  100 strip  1   . blood glucose monitor (ONE TOUCH ULTRA SYSTEM) kit Use as instructed  1  each  0   . Blood Glucose Calibration (OT ULTRA/FASTTK CNTRL SOLN) SOLN As directed  1 each  5   . CINNAMON PO Take by mouth       . cyanocobalamin (VITAMIN B-12) 1000 MCG tablet Take 1,000 mcg by mouth daily       . Co-Enzyme Q-10 100 MG CAPS Take by mouth       . omega-3 acid ethyl esters (LOVAZA) 1 GM capsule Take 2 g by mouth 2 times daily   Swallow whole. Do not crush or chew.       . Multiple Vitamins-Minerals (MULTI VITAMIN/MINERALS PO) Take by mouth       . HYDROcodone-acetaminophen (VICODIN) 5-500 MG per tablet 1-2 po q 6 hours prn pain MDD=6  180 tablet  1   . cholecalciferol (VITAMIN D) 1000 UNIT tablet Take 2,000 Units by mouth daily           . FISH OIL by Does not apply route daily.       [name] acetaminophen (TYLENOL) 500 MG tablet Take 500 mg by mouth 2 times daily.         No current facility-administered medications for this visit.         Physical Exam:  Filed Vitals:    09/04/13 0909   BP: 138/72   Pulse: 58   Weight: 104.781 kg (231 lb)     Gen: patient appears comfortable and in no distress  Cv: RRR, Nl S1,S2  Resp: CTA b/l, no rales, no rhonchi  Ext: no edema          Assessment:  79 y.o.male today presenting for  followup of diabetes mellitus, anxiety and anticoagulation for protein S deficiency     1. Need for influenza vaccination  Flu-Vaccine High dose, greater than or equal to 41 yo, preservative free         Plan:    1.  DM type II -    - con't amaryl (glimiperide), januvia and metformin   - check HbA1c prior to next visit   -  increase exercise and diet - advised low glycemic diet   Diabetes metrics:    Last ophthalmology exam: Seen already Sept 2014 (Dr. Lurena Joiner)    Last podiatry exam: Seen already Sept 2014     Lesions on feet: no    Monofilament test of feet: mild neuropathy    Tobacco use: no    Previous history of tobacco use: never      2.  Anxiety -    - patient with medicare C, will refer to [facility]   - will avoid medications at this time    3.  Protein S -    - con't coumadin   -  f/u INR      Follow up with [place] in 3 months, prior to going south for the Winter    Laverda Page, MD  Internal Medicine

## 2013-09-04 NOTE — Patient Instructions (Signed)
Please obtain bloodwork prior to our visit  Try and educate yourself on low glycemic diets (such as [place], etc)  Will find out more information about therapists (eg [employer])

## 2013-09-08 ENCOUNTER — Other Ambulatory Visit: Payer: Self-pay | Admitting: Primary Care

## 2013-09-08 ENCOUNTER — Ambulatory Visit: Payer: Self-pay | Admitting: Dermatology

## 2013-09-09 DIAGNOSIS — Z79891 Long term (current) use of opiate analgesic: Secondary | ICD-10-CM | POA: Insufficient documentation

## 2013-09-21 ENCOUNTER — Telehealth: Payer: Self-pay | Admitting: Primary Care

## 2013-09-21 NOTE — Telephone Encounter (Signed)
Left message on voice mail to have INR done tomorrow am.

## 2013-09-22 ENCOUNTER — Ambulatory Visit
Admit: 2013-09-22 | Discharge: 2013-09-22 | Disposition: A | Discharge status: Home / Self Care | Payer: Self-pay | Source: Ambulatory Visit | Attending: Primary Care | Admitting: Primary Care

## 2013-09-22 DIAGNOSIS — D6859 Other primary thrombophilia: Secondary | ICD-10-CM

## 2013-09-22 LAB — PROTIME-INR
INR: 2.8 — ABNORMAL HIGH (ref 1.0–1.2)
Protime: 30.7 s — ABNORMAL HIGH (ref 9.2–12.3)

## 2013-09-22 NOTE — Telephone Encounter (Signed)
INR done see flowsheet

## 2013-09-23 ENCOUNTER — Ambulatory Visit: Payer: Self-pay

## 2013-09-23 DIAGNOSIS — E8809 Other disorders of plasma-protein metabolism, not elsewhere classified: Secondary | ICD-10-CM

## 2013-09-23 NOTE — Patient Instructions (Signed)
Spoke with [name] and reviewed INR and to recheck again on 10/21.

## 2013-10-06 ENCOUNTER — Ambulatory Visit
Admit: 2013-10-06 | Discharge: 2013-10-06 | Disposition: A | Discharge status: Home / Self Care | Payer: Self-pay | Source: Ambulatory Visit | Attending: Primary Care | Admitting: Primary Care

## 2013-10-06 DIAGNOSIS — D6859 Other primary thrombophilia: Secondary | ICD-10-CM

## 2013-10-06 LAB — PROTIME-INR
INR: 2.3 — ABNORMAL HIGH (ref 1.0–1.2)
Protime: 25.2 s — ABNORMAL HIGH (ref 9.2–12.3)

## 2013-10-07 ENCOUNTER — Ambulatory Visit: Payer: Self-pay

## 2013-10-07 DIAGNOSIS — E8809 Other disorders of plasma-protein metabolism, not elsewhere classified: Secondary | ICD-10-CM

## 2013-10-07 NOTE — Patient Instructions (Signed)
Spoke with Aurea Graff and reviewed INR results and to have [name] recheck again in four weeks.

## 2013-10-12 ENCOUNTER — Ambulatory Visit: Payer: Self-pay | Admitting: Dermatology

## 2013-10-12 ENCOUNTER — Encounter: Payer: Self-pay | Admitting: Dermatology

## 2013-10-12 VITALS — BP 110/70 | Ht 73.0 in | Wt 228.0 lb

## 2013-10-12 DIAGNOSIS — D485 Neoplasm of uncertain behavior of skin: Secondary | ICD-10-CM

## 2013-10-12 NOTE — Progress Notes (Addendum)
Consulting MD: Shyrl Numbers, MD    CC: rash on penis    HPI: Pt is a 79 y.o. Caucasian male with type II diabetes, h/o Paget's (spine), protein s deficiency and BPH who presents for the first time to dermatology clinic with the above complaints. He denies a personal or family h/o skin cancer. Patient is here today with his wife. He reports a 1-2 year h/o a red, sometimes pruritic rash on his distal penis and foreskin. It waxes and wanes in redness and itch, and today is a relatively good day. He sometimes uses lidex ointment, which helps decrease the redness and itch, but at most a 1-2 times weekly. He often goes several weeks without using it. The rash has gone away almost entirely at times in the past. It does not seem to be spreading. The eruption does not bleed or have any drainage. He does not have difficulty retracting for the foreskin. He retracts the foreskin to wash the area twice to three times weekly. He has not been using any different soaps or personal care products in the area. He sees Dr. Neva Seat for focused skin exams.    ROS: Pt is otherwise in normal state of health    Allergies:  Allergies   Allergen Reactions   . Bee Venom Other (See Comments)     Red and swelling   . No Known Latex Allergy      Created by Conversion - 0;    . Insects [Other] Other (See Comments)     Red and swelling       PMH:   Patient Active Problem List   Diagnosis Code   . Sinus Bradycardia 427.89   . Dementia 294.8   . Paget's Disease 731.0   . Type II Diabetes Mellitus 250.00   . Obesity 278.00   . Thrombophlebitis Of Deep Vessels Of The Lower Extremity 451.1   . Protein S Deficiency 273.8   . Epistaxis 784.7   . Urgency of urination 788.63   . BPH (benign prostatic hyperplasia) 600.00   . Penile lesion 607.89   . Penile rash 607.9   . Opiate analgesic contract exists V68.89       FH: no h/o skin cancer  History reviewed. No pertinent family history.    SocH: denies toxic habits. Married. Goes to [facility]  [place]  History     Social History   . Marital Status: Married     Spouse Name: N/A     Number of Children: N/A   . Years of Education: N/A     Occupational History   . Former Counsellor, used to work night shift      Social History Main Topics   . Smoking status: Never Smoker    . Smokeless tobacco: Never Used   . Alcohol Use: No   . Drug Use: No   . Sexual Activity: Not on file     Other Topics Concern   . Not on file     Social History Narrative    2 daughters with neurological issues         PE  Filed Vitals:    10/12/13 1018   BP: 110/70   Height: 1.854 m (6\' 1" )   Weight: 103.42 kg (228 lb)     General: Awake and alert, elderly adult male in NAD  The following were examined and WNL unless otherwise noted below:  -Face/Neck/Scalp:  -Chest/Abdomen/Back: Scattered on trunk are stuck on, hyperkeratotic, waxy  papules  -BUE/hands:  -BLE/feet:  -Buttocks/Groin: Over the distal shaft of the penis and the interior foreskin, there is a well demarcated, bright red, shiny plaque without erosions or exudate appreciated circumscribing the affected area  -Nails/Hair:    Barriers to learning: None    Assessment/Plan:    1) Minimally symptomatic, erythematous rash of unknown etiology on penis- 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, rule out Erythroplasia of [place]  -Possible diagnoses discussed  -Recommend punch biopsy to establish diagnosis  -The patient will be called with the pathology results once available    2) Seborrheic keratoses  -Diagnosis discussed  -Patient reassured  -No treatment needed at this time  -Counseled re: wearing sunscreen and protective clothing; monitoring moles based on ABCDE criteria    Procedure Note: Punch Biopsy  -After informed written consent was obtained, Chlorhexidine was used for antisepsis, 1% Lidocaine with epinephrine for local anesthesia, with clean technique a 3 mm punch biopsy was performed of lesional skin. Hemostasis was obtained with aluminum chloride and surgifoam.  Vaseline applied. Wound care instructions provided. The procedure was well tolerated without complications.    Time Out  Procedure : Punch biopsy  Site: penis  Preprocedure verification conducted prior to Time Out? Yes  Time Out completed: 1100  Start time: 1105  Stop time: 1115  Procedure Timeout Participants: Self, RN LM  Preprocedure verification conducted prior to Time Out? Yes  Consents Obtained: Yes  Correct Patient (Use 2 Identifiers): Yes  Correct Procedure: Yes  Correct Level: Yes  Correct Site: Yes  Site Marked? Yes  Correct Patient Position: Yes  Correct Equipment/Implants Available: Yes    Procedure Prep/Precautions:   Appropriate Hand Hygiene Used: Yes  Procedure site indicated? Yes  Chlorhexidine Skin Prep Used: Yes  Full Barrier in Place and Maintained Throughout Procedure: Yes    RTC pending results    Patient seen and examined with Dr. Vance Peper, MD      I saw and evaluated the patient with resident. I agree with the resident's history, findings and plan of care as documented above.  I was present for key portions of procedure.    Jolene Provost, MD

## 2013-10-12 NOTE — Patient Instructions (Addendum)
1) rash on penis  - biopsy performed; will call with results in 1-2 weeks (may leave message)  - wound may ooze slightly  - keep covered with vaseline and gently cleanse with warm water and mild soap at least once daily    Follow up prn      Postoperative Care of the Biopsy site:      Keep the original dressing on and dry for 24-48 hours.    Remove the dressing and wash with mild soap and water.    Apply a thin layer of Polysporin/Aquaphor ointment (If you have steri-strips over the site do not use any ointment.)    Cover the wound with a Band-aid or a piece of Telfa held in place with a Band-Aid or tape.    The wound should be cleaned and the dressing changed once a day for one to two weeks.      Plain Tylenol or Extra Strength Tylenol should relieve any pain you may have as a result of the biopsy.      If you experience bleeding, apply firm pressure with gauze over the area for 15 minutes.    For emergencies after hours or on the weekend call the [facility] office at [phone] and a provider will contact you.

## 2013-10-13 ENCOUNTER — Other Ambulatory Visit: Payer: Self-pay | Admitting: Primary Care

## 2013-10-14 ENCOUNTER — Telehealth: Payer: Self-pay | Admitting: Dermatology

## 2013-10-14 DIAGNOSIS — N481 Balanitis: Secondary | ICD-10-CM

## 2013-10-14 LAB — SURGICAL PATHOLOGY

## 2013-10-14 NOTE — Telephone Encounter (Signed)
Based on pathology, we feel condition is most c/w Zoon's balanitis. Called patient with results, no answer x 2 attempts. Will recommend using lidex ointment twice daily for 2 weeks on, then one week off as needed for rash on penis.      FINAL DIAGNOSIS: Skin, penis, biopsy: - Plasma cell-rich infiltrate with eosinophils and epidermal atrophy (see comment).     COMMENT: The epidermis is mildly atrophic, and there is separation of the epidermis from the dermis at the level of the DE junction at the lateral biopsy edge, which could represent an artifact. The dermis shows a plasma cell-rich infiltrate with easily identifiable eosinophils. The differential diagnosis includes Zoon's balanitis, as well as an unusual or atypical fixed drug eruption. Because of the presence of a subepidermal split seen at the lateral edge of the biopsy, an atypical or unusual blistering disorder such as bullous pemphigoid is in the differential diagnosis.

## 2013-10-15 MED ORDER — FLUOCINONIDE 0.05 % EX OINT *I*
TOPICAL_OINTMENT | Freq: Two times a day (BID) | CUTANEOUS | Status: DC | PRN
Start: 2013-10-15 — End: 2013-12-14

## 2013-10-15 NOTE — Telephone Encounter (Signed)
Spoke to patient and his wife with results- zoon's balanitis. Recommend lidex ointment 1-2 times daily prn, two weeks on and one week off.

## 2013-10-20 ENCOUNTER — Other Ambulatory Visit: Payer: Self-pay | Admitting: Primary Care

## 2013-11-03 ENCOUNTER — Other Ambulatory Visit: Payer: Self-pay | Admitting: Primary Care

## 2013-11-04 ENCOUNTER — Ambulatory Visit
Admit: 2013-11-04 | Discharge: 2013-11-04 | Disposition: A | Discharge status: Home / Self Care | Payer: Self-pay | Source: Ambulatory Visit | Attending: Primary Care | Admitting: Primary Care

## 2013-11-04 ENCOUNTER — Ambulatory Visit: Payer: Self-pay

## 2013-11-04 DIAGNOSIS — E119 Type 2 diabetes mellitus without complications: Secondary | ICD-10-CM

## 2013-11-04 DIAGNOSIS — D6859 Other primary thrombophilia: Secondary | ICD-10-CM

## 2013-11-04 DIAGNOSIS — E8809 Other disorders of plasma-protein metabolism, not elsewhere classified: Secondary | ICD-10-CM

## 2013-11-04 LAB — HEMOGLOBIN A1C: Hemoglobin A1C: 8.3 % — ABNORMAL HIGH (ref 4.0–6.0)

## 2013-11-04 LAB — PROTIME-INR
INR: 2.8 — ABNORMAL HIGH (ref 1.0–1.2)
Protime: 30.8 s — ABNORMAL HIGH (ref 9.2–12.3)

## 2013-11-04 NOTE — Patient Instructions (Signed)
Spoke with Jon [name] and reviewed INR with her and to have [name] stay on same dose and recheck on 12/17.  She stated she would let [name] know.

## 2013-11-09 ENCOUNTER — Telehealth: Payer: Self-pay | Admitting: Primary Care

## 2013-11-09 NOTE — Telephone Encounter (Signed)
Message copied by Annye Rusk on Mon Nov 09, 2013  4:27 PM  ------       Message from: Laverda Page       Created: Fri Nov 06, 2013  9:51 PM         Please find out what is going on with him - diet out of control?  Not taking meds?  Needs to see [place] prior [place] leaving [place]... Thank you!  ------

## 2013-11-09 NOTE — Telephone Encounter (Signed)
Spoke with Aurea Graff and she states that [employer] is probably out of control, I gave her the number of his Hba1c, 8.3 and she will call tomorrow am to make an appointment for [name] to come in to be seen before they head south.

## 2013-11-09 NOTE — Telephone Encounter (Signed)
Message copied by Annye Rusk on Mon Nov 09, 2013  4:28 PM  ------       Message from: Laverda Page       Created: Fri Nov 06, 2013  9:51 PM         Please find out what is going on with him - diet out of control?  Not taking meds?  Needs to see [place] prior to leaving [place]... Thank you!  ------

## 2013-11-11 ENCOUNTER — Encounter: Payer: Self-pay | Admitting: Dermatology

## 2013-11-11 ENCOUNTER — Ambulatory Visit: Payer: Self-pay | Admitting: Dermatology

## 2013-11-11 VITALS — BP 130/82 | Ht 72.0 in | Wt 225.0 lb

## 2013-11-11 DIAGNOSIS — N481 Balanitis: Secondary | ICD-10-CM

## 2013-11-11 NOTE — Progress Notes (Addendum)
CC: follow up for zoon's balanitis    HPI: Jon [name] is a 79 y.o. male with type II diabetes, h/o Paget's (spine), protein s deficiency and BPH who presents for above. He was last seen 1 month ago for a rash on the penis which had been present 1-2 years and which is sometimes pruritus. A biopsy was done which showed plasma cell rich infiltrate with eosinophils and epidermal atrophy and clinical pathologic correlation was most consistent with zoon's balanitis. He started lidex ointment BID and noticed rash went away in 3-4 days. He continued to use the lidex BID despite the rash resolving.     ROS: otherwise in usual state of health, no other skin concerns    Vitals:   Filed Vitals:    11/11/13 1006   BP: 130/82   Height: 1.829 m (6')   Weight: 102.059 kg (225 lb)     Body mass index is 30.51 kg/(m^2).    Exam: Alert and oriented, in NAD. All of the following were examined and were within normal limits, except as noted:    General: Fitzpatrick skin type: II  Scalp:  Face/Neck:  Arms/Hands:  Legs/Feet:  Buttocks, Genitalia:  No rash seen on the penis    Hair, Nails, Eyes:       Barriers to learning:  None     Assessment/Plan:  1. Zoon's balanitis, improved with lidex ointment  Stop the lidex ointment for now. If the rash comes back then restart the lidex ointment twice daily until the rash is gone then stop. May repeat. Discussed potential risk of atrophy with long term continuous steroid use     Follow up prn     Pt seen and examined with Dr. Aviva Kluver, MD       I saw and evaluated the patient with resident. I agree with the resident's findings and plan of care as documented above.   Jolene Provost, MD

## 2013-11-11 NOTE — Patient Instructions (Signed)
Stop the lidex ointment for now. If the rash comes back then restart the lidex ointment twice daily until the rash is gone then stop. May repeat.

## 2013-11-17 ENCOUNTER — Ambulatory Visit: Payer: Self-pay | Admitting: Primary Care

## 2013-11-18 ENCOUNTER — Telehealth: Payer: Self-pay | Admitting: Primary Care

## 2013-11-18 DIAGNOSIS — I82409 Acute embolism and thrombosis of unspecified deep veins of unspecified lower extremity: Secondary | ICD-10-CM

## 2013-12-02 ENCOUNTER — Telehealth: Payer: Self-pay | Admitting: Primary Care

## 2013-12-02 NOTE — Telephone Encounter (Signed)
Spoke with Jon [name] and she said that [name] will go tomorrow to have his INR done.

## 2013-12-03 ENCOUNTER — Ambulatory Visit
Admit: 2013-12-03 | Discharge: 2013-12-03 | Disposition: A | Discharge status: Home / Self Care | Payer: Self-pay | Source: Ambulatory Visit | Attending: Primary Care | Admitting: Primary Care

## 2013-12-03 ENCOUNTER — Ambulatory Visit: Payer: Self-pay

## 2013-12-03 DIAGNOSIS — I82409 Acute embolism and thrombosis of unspecified deep veins of unspecified lower extremity: Secondary | ICD-10-CM

## 2013-12-03 DIAGNOSIS — E8809 Other disorders of plasma-protein metabolism, not elsewhere classified: Secondary | ICD-10-CM

## 2013-12-03 LAB — PROTIME-INR
INR: 2.1 — ABNORMAL HIGH (ref 1.0–1.2)
Protime: 23.2 s — ABNORMAL HIGH (ref 9.2–12.3)

## 2013-12-03 NOTE — Patient Instructions (Signed)
Spoke with [name] and reviewed INR with him and to continue same dose and repeat in 4 weeks.

## 2013-12-04 NOTE — Telephone Encounter (Signed)
INR done and addressed.

## 2013-12-14 ENCOUNTER — Encounter: Payer: Self-pay | Admitting: Primary Care

## 2013-12-14 ENCOUNTER — Ambulatory Visit: Payer: Self-pay | Admitting: Primary Care

## 2013-12-14 VITALS — BP 130/78 | HR 62 | Wt 224.0 lb

## 2013-12-14 DIAGNOSIS — E119 Type 2 diabetes mellitus without complications: Secondary | ICD-10-CM

## 2013-12-14 DIAGNOSIS — F419 Anxiety disorder, unspecified: Secondary | ICD-10-CM

## 2013-12-14 MED ORDER — ESCITALOPRAM OXALATE 5 MG PO TABS *I*
5.0000 mg | ORAL_TABLET | Freq: Every day | ORAL | Status: DC
Start: 2013-12-14 — End: 2015-11-07

## 2013-12-14 NOTE — Patient Instructions (Addendum)
1.  Please work on your diet and exercise - if this doesn't work, we will refer you to Endocrinology (formerly seen at the [facility])  2.  We are going to start you on a low dose of lexapro - let us know if this is working in a few weeks    3.  Weight loss - look into programs that are low-glycemic (such as [employer], [employer], etc.) - they have books, online services and smartphone apps   - keeping track of what you eat keeps you honest.   - other tricks:      - drink a glass of water before eating a meal - it takes about 20-30 minutes to feel full, so overeating is very easy.       - eat slowly - put down your fork in between bites if you can     - use a smaller plate     - hydrate yourself with water     - don't eat in front of a TV or computer - this leads to mindless eating   - have healthy and low calorie snacks available    - cut out the easy high calorie items: sugary beverages, alcoholic beverages, high calorie desserts and mindless night snacking   - portion control is essential - a scale for food is very helpful     - Exercise. Strive for 30 minutes of activity most days of the week - walking is easy and always accessible.     - If you're bored or stressed - take a walk.  Always a healthier and more satisfying option.    4.  Please have your bloodwork done when you come back from down [place]

## 2013-12-14 NOTE — Progress Notes (Signed)
CC: "feeling good"    HPI:  Jon [name] is a 79 y.o.male here for f/u of DM    Here with wife.     Per patient:    1.  Psychosocial stressors: Daughter starting new regimen for Langerhans cell tumor - tx in [facility].  Was told by [facility] that he doesn't have the cognitive abilities to follow through (on namenda and aricept).  Wife thinks he's still more short with people when anxious - today pleasant and cooperative (which is how I remember him from last visit)    2.  Diabetes - notices spikes up to 239.  Today 124 -->205-->196.  Taking amaryl (glimiperide), januvia and metformin.      3.  Protein S - on coumadin, INR's therapeutic      Review of systems:   No fever  No headache  No chest pain  No shortness of breath  No cough  No change of appetite  No nausea  No vomiting  No abdominal pain  No stool changes  No urinary symptoms  No dizziness or lightheadedness  No depression, (+) anxiety  No skin rashes      Past Medical History   Diagnosis Date   . DM type 2 (diabetes mellitus, type 2)    . Paget's disease of bone    . Protein S deficiency      On chronic anticoagulation   . BPH (benign prostatic hypertrophy)    . Sinus bradycardia    . Dementia      mild   . Arthritis    . Varicella      Past Surgical History   Procedure Laterality Date   . Skin graft     . Joint replacement     . Cholecystectomy     . Knee replacement     . Tonsillectomy         Allergies:   Allergies   Allergen Reactions   . Bee Venom Other (See Comments)     Red and swelling   . No Known Latex Allergy      Created by Conversion - 0;    . Insects [Other] Other (See Comments)     Red and swelling     No family history on file.  History     Social History   . Marital Status: Married     Spouse Name: N/A     Number of Children: N/A   . Years of Education: N/A     Occupational History   . Former Counsellor, used to work night shift      Social History Main Topics   . Smoking status: Never Smoker    . Smokeless tobacco: Never Used   . Alcohol  Use: No   . Drug Use: No   . Sexual Activity: None     Other Topics Concern   . None     Social History Narrative    2 daughters with neurological issues       Current Outpatient Prescriptions   Medication Sig Dispense Refill   . glimepiride (AMARYL) 2 MG tablet TAKE 1 TABLET BY MOUTH ONE TIME DAILY WITH BREAKFAST  90 tablet  3   . pravastatin (PRAVACHOL) 40 MG tablet TAKE ONE TABLET BY MOUTH ONCE DAILY  90 tablet  5   . meloxicam (MOBIC) 7.5 MG tablet TAKE 1 TABLET BY MOUTH ONE TIME DAILY  30 tablet  3   . trospium (SANCTURA XR) extended release capsule  Take 1 capsule (60 mg total) by mouth daily (before breakfast)  90 capsule  3   . finasteride (PROSCAR) 5 MG tablet TAKE ONE TABLET BY MOUTH ONCE DAILY FOR USE BY MEN ONLY  90 tablet  1   . fluocinonide-emollient (LIDEX-E) 0.05 % cream Apply topically 2 times daily  30 g  2   . donepezil (ARICEPT) 10 MG tablet 1 tab daily  90 tablet  3   . memantine (NAMENDA) 10 MG tablet Take 1 Tablet by mouth Twice a Day  180 tablet  3   . COUMADIN 2.5 MG tablet TAKE TWO TABLETS BY MOUTH ON MONDAY AND THURSDAY. TAKE ONE AND ONE HALF TABLETS REST OF DAYS OR AS DIRECTED  138 tablet  2   . gabapentin (NEURONTIN) 300 MG capsule TAKE ONE CAPSULE BY MOUTH TWICE A DAY  180 capsule  0   . metFORMIN (GLUCOPHAGE) 1000 MG tablet 1 tab BID with food  180 tablet  4   . sitaGLIPtin (JANUVIA) 100 MG tablet TAKE ONE TABLET BY MOUTH EVERY DAY  90 tablet  1   . lancets (ONETOUCH ULTRASOFT) Use   2  times per day as instructed for blood glucose testing.  100 each  3   . blood glucose (ONE TOUCH) test strip To be used twice daily  100 strip  1   . blood glucose monitor (ONE TOUCH ULTRA SYSTEM) kit Use as instructed  1 each  0   . Blood Glucose Calibration (OT ULTRA/FASTTK CNTRL SOLN) SOLN As directed  1 each  5   . CINNAMON PO Take by mouth       . cyanocobalamin (VITAMIN B-12) 1000 MCG tablet Take 1,000 mcg by mouth daily       . Co-Enzyme Q-10 100 MG CAPS Take by mouth       . omega-3 acid ethyl  esters (LOVAZA) 1 GM capsule Take 2 g by mouth 2 times daily   Swallow whole. Do not crush or chew.       . Multiple Vitamins-Minerals (MULTI VITAMIN/MINERALS PO) Take by mouth       . HYDROcodone-acetaminophen (VICODIN) 5-500 MG per tablet 1-2 po q 6 hours prn pain MDD=6  180 tablet  1   . cholecalciferol (VITAMIN D) 1000 UNIT tablet Take 2,000 Units by mouth daily           . FISH OIL by Does not apply route daily.       [name] acetaminophen (TYLENOL) 500 MG tablet Take 500 mg by mouth 2 times daily.         No current facility-administered medications for this visit.         Physical Exam:  Filed Vitals:    12/14/13 1141   BP: 130/78   Pulse: 62   Weight: 101.606 kg (224 lb)     Constitutional: Comfortable, no apparent distress, overweight   Eyes:  Equal, round and reactive, conjunctivae clear, lids normal  ENMT:   Moist mucous membranes, no oropharyngeal lesions, good dentition, hearing intact    Resp:  Unlabored inspiratory effort, lungs clear to auscultation bilaterally, no wheezes, no rhonchi   CV:    Regular S1S2, no murmurs, no peripheral edema   Abd:  Abdomen soft, non-tender, not distended, bowel sounds present, no hepatosplenomegaly   MSK: No joint tenderness or inflammation   Skin: Warm, dry, good turgor, no rashes    Neuro:  Speech intact, no motor or sensory deficits   Psych:  Alert and oriented x3, normal affect and mood       Assessment:  79 y.o.male today presenting for f/u of chronic issues        Plan:    1.  Anxiety - in light of chronic issues (2 daughters w/neuro issues, 35 y.o. Grandson)   - will start on low dose lexapro 5mg  - patient and family to contact us if no effect or not tolerating it    2.  DM type II -    - con't amaryl (glimiperide), januvia and metformin   - last HbA1c done 10/2013 = 8.3    - f/u HbA1c   - reinforced diet and exercise - if not improving, will refer to endocrinology (was seen @[facility] )   Diabetes metrics:    Last ophthalmology exam: seen 08/2013 (Dr. Lurena Joiner)    Last  podiatry exam: seen 08/2013    Lesions on feet:  no    Checks feet daily/nightly: yes     - counseling on daily foot checks done today    Has mild neuropathy bilaterally    Tobacco use: no      3.  Protein S -    - con't coumadin   - f/u INR      Follow up with [place] in 3 months    Laverda Page, MD  Internal Medicine

## 2013-12-30 ENCOUNTER — Ambulatory Visit
Admit: 2013-12-30 | Discharge: 2013-12-30 | Disposition: A | Discharge status: Home / Self Care | Payer: Self-pay | Source: Ambulatory Visit | Attending: Primary Care | Admitting: Primary Care

## 2013-12-30 DIAGNOSIS — I82409 Acute embolism and thrombosis of unspecified deep veins of unspecified lower extremity: Secondary | ICD-10-CM

## 2013-12-30 LAB — PROTIME-INR
INR: 1.9 — ABNORMAL HIGH (ref 1.0–1.2)
Protime: 20.4 s — ABNORMAL HIGH (ref 9.2–12.3)

## 2013-12-31 ENCOUNTER — Ambulatory Visit: Payer: Self-pay

## 2013-12-31 DIAGNOSIS — E8809 Other disorders of plasma-protein metabolism, not elsewhere classified: Secondary | ICD-10-CM

## 2013-12-31 NOTE — Patient Instructions (Signed)
Spoke with [name] and he states he missed one day, he will continue the same dose and will recheck in one week.

## 2014-01-06 ENCOUNTER — Ambulatory Visit
Admit: 2014-01-06 | Discharge: 2014-01-06 | Disposition: A | Discharge status: Home / Self Care | Payer: Self-pay | Source: Ambulatory Visit | Attending: Primary Care | Admitting: Primary Care

## 2014-01-06 ENCOUNTER — Ambulatory Visit: Payer: Self-pay

## 2014-01-06 DIAGNOSIS — E8809 Other disorders of plasma-protein metabolism, not elsewhere classified: Secondary | ICD-10-CM

## 2014-01-06 DIAGNOSIS — I82409 Acute embolism and thrombosis of unspecified deep veins of unspecified lower extremity: Secondary | ICD-10-CM

## 2014-01-06 LAB — PROTIME-INR
INR: 2.1 — ABNORMAL HIGH (ref 1.0–1.2)
Protime: 22.7 s — ABNORMAL HIGH (ref 9.2–12.3)

## 2014-01-06 NOTE — Patient Instructions (Signed)
Spoke with [name] and to have [name] stay on same dose and recheck again in two weeks.

## 2014-01-18 ENCOUNTER — Telehealth: Payer: Self-pay | Admitting: Primary Care

## 2014-01-18 MED ORDER — MELOXICAM 7.5 MG PO TABS *I*
7.5000 mg | ORAL_TABLET | Freq: Every day | ORAL | Status: DC
Start: 2014-01-18 — End: 2014-06-02

## 2014-01-18 NOTE — Telephone Encounter (Signed)
Mobic Rx refilled.

## 2014-01-18 NOTE — Telephone Encounter (Signed)
Patient needs a refill on Mobic 7.5 Mg tab.

## 2014-01-21 ENCOUNTER — Ambulatory Visit
Admit: 2014-01-21 | Discharge: 2014-01-21 | Disposition: A | Discharge status: Home / Self Care | Payer: Self-pay | Source: Ambulatory Visit | Attending: Primary Care | Admitting: Primary Care

## 2014-01-21 ENCOUNTER — Ambulatory Visit: Payer: Self-pay

## 2014-01-21 DIAGNOSIS — E8809 Other disorders of plasma-protein metabolism, not elsewhere classified: Secondary | ICD-10-CM

## 2014-01-21 DIAGNOSIS — I82409 Acute embolism and thrombosis of unspecified deep veins of unspecified lower extremity: Secondary | ICD-10-CM

## 2014-01-21 LAB — PROTIME-INR
INR: 3.2 — ABNORMAL HIGH (ref 1.0–1.2)
Protime: 35.7 s — ABNORMAL HIGH (ref 9.2–12.3)

## 2014-01-21 NOTE — Patient Instructions (Signed)
Spoke with [name] and he states no NSAIDS or leafy green vegetables.  He will recheck again in two weeks.

## 2014-01-31 ENCOUNTER — Other Ambulatory Visit: Payer: Self-pay | Admitting: Primary Care

## 2014-02-03 ENCOUNTER — Ambulatory Visit: Payer: Self-pay

## 2014-02-03 ENCOUNTER — Ambulatory Visit
Admit: 2014-02-03 | Discharge: 2014-02-03 | Disposition: A | Discharge status: Home / Self Care | Payer: Self-pay | Source: Ambulatory Visit | Attending: Primary Care | Admitting: Primary Care

## 2014-02-03 DIAGNOSIS — E8809 Other disorders of plasma-protein metabolism, not elsewhere classified: Secondary | ICD-10-CM

## 2014-02-03 DIAGNOSIS — I82409 Acute embolism and thrombosis of unspecified deep veins of unspecified lower extremity: Secondary | ICD-10-CM

## 2014-02-03 LAB — PROTIME-INR
INR: 3.1 — ABNORMAL HIGH (ref 1.0–1.2)
Protime: 34.4 s — ABNORMAL HIGH (ref 9.2–12.3)

## 2014-02-03 NOTE — Patient Instructions (Signed)
I called and spoke with Jon [name] in regards to inr level of 3.1.  No change in dosing and repeat in 2 weeks 02/17/14.  [name] states understand and was able to repeat instructions back to me

## 2014-02-18 ENCOUNTER — Telehealth: Payer: Self-pay | Admitting: Primary Care

## 2014-02-18 ENCOUNTER — Ambulatory Visit
Admit: 2014-02-18 | Discharge: 2014-02-18 | Disposition: A | Discharge status: Home / Self Care | Payer: Self-pay | Source: Ambulatory Visit | Attending: Primary Care | Admitting: Primary Care

## 2014-02-18 ENCOUNTER — Ambulatory Visit: Payer: Self-pay

## 2014-02-18 DIAGNOSIS — I82409 Acute embolism and thrombosis of unspecified deep veins of unspecified lower extremity: Secondary | ICD-10-CM

## 2014-02-18 DIAGNOSIS — E8809 Other disorders of plasma-protein metabolism, not elsewhere classified: Secondary | ICD-10-CM

## 2014-02-18 DIAGNOSIS — E119 Type 2 diabetes mellitus without complications: Secondary | ICD-10-CM

## 2014-02-18 LAB — LIPID PANEL
Chol/HDL Ratio: 3.2
Cholesterol: 146 mg/dL
HDL: 46 mg/dL
LDL Calculated: 70 mg/dL
Non HDL Cholesterol: 100 mg/dL
Triglycerides: 150 mg/dL — AB

## 2014-02-18 LAB — CBC AND DIFFERENTIAL
Baso # K/uL: 0 10*3/uL (ref 0.0–0.1)
Basophil %: 0.3 % (ref 0.2–1.2)
Eos # K/uL: 0.3 10*3/uL (ref 0.0–0.5)
Eosinophil %: 4.8 % (ref 0.8–7.0)
Hematocrit: 41 % (ref 40–51)
Hemoglobin: 13.7 g/dL (ref 13.7–17.5)
Lymph # K/uL: 1.4 10*3/uL (ref 1.3–3.6)
Lymphocyte %: 23.4 % (ref 21.8–53.1)
MCH: 31 pg/cell (ref 26–32)
MCHC: 33 g/dL (ref 32–37)
MCV: 92 fL (ref 79–92)
Mono # K/uL: 0.5 10*3/uL (ref 0.3–0.8)
Monocyte %: 7.9 % (ref 5.3–12.2)
Neut # K/uL: 3.7 10*3/uL (ref 1.8–5.4)
Platelets: 128 10*3/uL — ABNORMAL LOW (ref 150–330)
RBC: 4.5 MIL/uL — ABNORMAL LOW (ref 4.6–6.1)
RDW: 13.6 % (ref 11.6–14.4)
Seg Neut %: 63.6 % (ref 34.0–67.9)
WBC: 5.9 10*3/uL (ref 4.2–9.1)

## 2014-02-18 LAB — COMPREHENSIVE METABOLIC PANEL
ALT: 30 U/L (ref 0–50)
AST: 32 U/L (ref 0–50)
Albumin: 4.1 g/dL (ref 3.5–5.2)
Alk Phos: 63 U/L (ref 40–130)
Anion Gap: 14 (ref 7–16)
Bilirubin,Total: 0.5 mg/dL (ref 0.0–1.2)
CO2: 21 mmol/L (ref 20–28)
Calcium: 9 mg/dL (ref 8.6–10.2)
Chloride: 105 mmol/L (ref 96–108)
Creatinine: 0.93 mg/dL (ref 0.67–1.17)
GFR,Black: 91 *
GFR,Caucasian: 78 *
Glucose: 152 mg/dL — ABNORMAL HIGH (ref 60–99)
Lab: 20 mg/dL (ref 6–20)
Potassium: 4.5 mmol/L (ref 3.3–5.1)
Sodium: 140 mmol/L (ref 133–145)
Total Protein: 6.5 g/dL (ref 6.3–7.7)

## 2014-02-18 LAB — HEMOGLOBIN A1C: Hemoglobin A1C: 6.9 % — ABNORMAL HIGH (ref 4.0–6.0)

## 2014-02-18 LAB — PROTIME-INR
INR: 3.2 — ABNORMAL HIGH (ref 1.0–1.2)
Protime: 35.6 s — ABNORMAL HIGH (ref 9.2–12.3)

## 2014-02-18 LAB — PSA (EFF.4-2010): PSA (eff. 4-2010): 0.09 ng/mL (ref 0.00–4.00)

## 2014-02-18 LAB — T4, FREE: Free T4: 0.9 ng/dL (ref 0.9–1.7)

## 2014-02-18 LAB — TSH: TSH: 4.99 u[IU]/mL — ABNORMAL HIGH (ref 0.27–4.20)

## 2014-02-18 LAB — VITAMIN B12: Vitamin B12: 718 pg/mL (ref 211–946)

## 2014-02-18 NOTE — Telephone Encounter (Signed)
Spoke with [name] and reviewed results with her and [name] and he states he has been going to the gym.  They were both glad to hear the Hba1c is down to 6.9.

## 2014-02-18 NOTE — Telephone Encounter (Signed)
-----   Message from Edythe Clarity, MD sent at 02/18/2014  1:26 PM EST -----  1.  His HbA1c is much better - congratulations!    2.  His INR is still up - would reduce dose to 3.75mg  x6 days a week, and 5mg  once weekly.  Repeat INR in 2 weeks.

## 2014-02-18 NOTE — Patient Instructions (Signed)
Spoke with [name] and reviewed INR with her and to have [name] change to 5 mg on Wed and 3.75 the rest of the week and to recheck again in two weeks.

## 2014-03-09 ENCOUNTER — Ambulatory Visit: Payer: Self-pay

## 2014-03-09 ENCOUNTER — Telehealth: Payer: Self-pay | Admitting: Primary Care

## 2014-03-09 ENCOUNTER — Ambulatory Visit
Admit: 2014-03-09 | Discharge: 2014-03-09 | Disposition: A | Discharge status: Home / Self Care | Payer: Self-pay | Source: Ambulatory Visit | Attending: Primary Care | Admitting: Primary Care

## 2014-03-09 DIAGNOSIS — E8809 Other disorders of plasma-protein metabolism, not elsewhere classified: Secondary | ICD-10-CM

## 2014-03-09 DIAGNOSIS — I82409 Acute embolism and thrombosis of unspecified deep veins of unspecified lower extremity: Secondary | ICD-10-CM

## 2014-03-09 LAB — PROTIME-INR
INR: 2.7 — ABNORMAL HIGH (ref 1.0–1.2)
Protime: 30.2 s — ABNORMAL HIGH (ref 9.2–12.3)

## 2014-03-09 NOTE — Patient Instructions (Signed)
i called and reviewed INR and dosing, he states he understands and when to check INR next.

## 2014-03-09 NOTE — Telephone Encounter (Signed)
Spoke with [name] and he states he will try to get blood work done today.

## 2014-03-10 NOTE — Telephone Encounter (Signed)
INR done and addressed, see INR flowsheet

## 2014-03-12 ENCOUNTER — Encounter: Payer: Self-pay | Admitting: Gastroenterology

## 2014-03-31 ENCOUNTER — Telehealth: Payer: Self-pay | Admitting: Primary Care

## 2014-03-31 ENCOUNTER — Ambulatory Visit
Admit: 2014-03-31 | Discharge: 2014-03-31 | Disposition: A | Discharge status: Home / Self Care | Payer: Self-pay | Source: Ambulatory Visit | Attending: Primary Care | Admitting: Primary Care

## 2014-03-31 DIAGNOSIS — I82409 Acute embolism and thrombosis of unspecified deep veins of unspecified lower extremity: Secondary | ICD-10-CM

## 2014-03-31 LAB — PROTIME-INR
INR: 2 — ABNORMAL HIGH (ref 1.0–1.2)
Protime: 21.6 s — ABNORMAL HIGH (ref 9.2–12.3)

## 2014-03-31 NOTE — Telephone Encounter (Signed)
Spoke with [name] and she will tell [name] to have his INR done.

## 2014-04-01 ENCOUNTER — Ambulatory Visit: Payer: Self-pay

## 2014-04-01 DIAGNOSIS — E8809 Other disorders of plasma-protein metabolism, not elsewhere classified: Secondary | ICD-10-CM

## 2014-04-01 NOTE — Patient Instructions (Signed)
Spoke with [name] and [name] to continue same dose and recheck again on 5/18

## 2014-04-01 NOTE — Telephone Encounter (Signed)
INR done and addressed.

## 2014-04-29 ENCOUNTER — Ambulatory Visit
Admit: 2014-04-29 | Discharge: 2014-04-29 | Disposition: A | Discharge status: Home / Self Care | Payer: Self-pay | Source: Ambulatory Visit | Attending: Primary Care | Admitting: Primary Care

## 2014-04-29 DIAGNOSIS — I82409 Acute embolism and thrombosis of unspecified deep veins of unspecified lower extremity: Secondary | ICD-10-CM

## 2014-04-29 LAB — PROTIME-INR
INR: 3.2 — ABNORMAL HIGH (ref 1.0–1.2)
Protime: 35.7 s — ABNORMAL HIGH (ref 9.2–12.3)

## 2014-04-30 ENCOUNTER — Other Ambulatory Visit: Payer: Self-pay | Admitting: Primary Care

## 2014-04-30 ENCOUNTER — Ambulatory Visit: Payer: Self-pay

## 2014-04-30 DIAGNOSIS — E8809 Other disorders of plasma-protein metabolism, not elsewhere classified: Secondary | ICD-10-CM

## 2014-04-30 LAB — HM DIABETES FOOT EXAM

## 2014-04-30 NOTE — Patient Instructions (Signed)
Spoke with [name] no change in medication or diet, will go to 4 mg on Sunday's and 3.75 the other 6 days and will recheck again in two weeks.

## 2014-05-03 ENCOUNTER — Encounter: Payer: Self-pay | Admitting: Gastroenterology

## 2014-05-07 ENCOUNTER — Other Ambulatory Visit: Payer: Self-pay | Admitting: Primary Care

## 2014-05-07 MED ORDER — COUMADIN 2.5 MG PO TABS
ORAL_TABLET | ORAL | Status: DC
Start: 2014-05-07 — End: 2014-12-01

## 2014-05-07 NOTE — Telephone Encounter (Signed)
Fax came over from [place] asking for 4mg  1qd.

## 2014-05-11 ENCOUNTER — Other Ambulatory Visit: Payer: Self-pay | Admitting: Primary Care

## 2014-05-12 ENCOUNTER — Other Ambulatory Visit: Payer: Self-pay | Admitting: Primary Care

## 2014-05-12 MED ORDER — WARFARIN SODIUM 4 MG PO TABS *A*
4.0000 mg | ORAL_TABLET | Freq: Every day | ORAL | Status: DC
Start: 2014-05-12 — End: 2014-12-01

## 2014-05-13 ENCOUNTER — Encounter: Payer: Self-pay | Admitting: Gastroenterology

## 2014-05-17 ENCOUNTER — Other Ambulatory Visit: Payer: Self-pay | Admitting: Primary Care

## 2014-05-17 ENCOUNTER — Telehealth: Payer: Self-pay | Admitting: Primary Care

## 2014-05-17 NOTE — Telephone Encounter (Signed)
Left message on personal voice mail to have INR done tomorrow if possible.

## 2014-05-18 ENCOUNTER — Ambulatory Visit
Admit: 2014-05-18 | Discharge: 2014-05-18 | Disposition: A | Discharge status: Home / Self Care | Payer: Self-pay | Source: Ambulatory Visit | Attending: Primary Care | Admitting: Primary Care

## 2014-05-18 ENCOUNTER — Ambulatory Visit: Payer: Self-pay

## 2014-05-18 DIAGNOSIS — E8809 Other disorders of plasma-protein metabolism, not elsewhere classified: Secondary | ICD-10-CM

## 2014-05-18 DIAGNOSIS — I82409 Acute embolism and thrombosis of unspecified deep veins of unspecified lower extremity: Secondary | ICD-10-CM

## 2014-05-18 LAB — PROTIME-INR
INR: 2.4 — ABNORMAL HIGH (ref 1.0–1.2)
Protime: 26.9 s — ABNORMAL HIGH (ref 9.2–12.3)

## 2014-05-18 NOTE — Patient Instructions (Signed)
i called and reviewed INR and dosing with patient and wife [name], they state they understand no dose change and confirm current dose.

## 2014-05-19 LAB — MICROALBUMIN, URINE, RANDOM
Creatinine,UR: 181 mg/dL (ref 20–300)
Microalb/Creat Ratio: 3.8 mg MA/g CR (ref 0.0–29.9)
Microalbumin,UR: 0.69 mg/dL

## 2014-05-19 NOTE — Telephone Encounter (Signed)
INR done and addressed, see flow sheet

## 2014-06-01 ENCOUNTER — Encounter: Payer: Self-pay | Admitting: Primary Care

## 2014-06-02 ENCOUNTER — Other Ambulatory Visit: Payer: Self-pay | Admitting: Primary Care

## 2014-06-17 ENCOUNTER — Telehealth: Payer: Self-pay | Admitting: Primary Care

## 2014-06-17 NOTE — Telephone Encounter (Signed)
Left message on personal voice mail to have INR done today or Monday am, as the labs are closed tomorrow and Saturday for the holiday.

## 2014-06-21 ENCOUNTER — Ambulatory Visit
Admit: 2014-06-21 | Discharge: 2014-06-21 | Disposition: A | Discharge status: Home / Self Care | Payer: Self-pay | Source: Ambulatory Visit | Attending: Primary Care | Admitting: Primary Care

## 2014-06-21 ENCOUNTER — Ambulatory Visit: Payer: Self-pay

## 2014-06-21 DIAGNOSIS — E8809 Other disorders of plasma-protein metabolism, not elsewhere classified: Secondary | ICD-10-CM

## 2014-06-21 DIAGNOSIS — I82409 Acute embolism and thrombosis of unspecified deep veins of unspecified lower extremity: Secondary | ICD-10-CM

## 2014-06-21 LAB — PROTIME-INR
INR: 2.5 — ABNORMAL HIGH (ref 1.0–1.2)
Protime: 27.3 s — ABNORMAL HIGH (ref 9.2–12.3)

## 2014-06-21 NOTE — Patient Instructions (Addendum)
Spoke with Adah Salvage and to continue 4 mg on Sundays and 3.75 mg other 6 days of the week and recheck INR again on 8/6.

## 2014-06-22 NOTE — Telephone Encounter (Signed)
INR done and addressed.

## 2014-06-28 ENCOUNTER — Other Ambulatory Visit: Payer: Self-pay | Admitting: Urology

## 2014-06-28 MED ORDER — TROSPIUM CHLORIDE 60 MG PO CP24 *A*
60.0000 mg | ORAL_CAPSULE | Freq: Every day | ORAL | Status: DC
Start: 2014-06-28 — End: 2014-09-08

## 2014-07-09 ENCOUNTER — Other Ambulatory Visit: Payer: Self-pay | Admitting: Primary Care

## 2014-07-09 ENCOUNTER — Encounter: Payer: Self-pay | Admitting: Primary Care

## 2014-07-09 ENCOUNTER — Ambulatory Visit: Payer: Self-pay | Admitting: Primary Care

## 2014-07-09 VITALS — BP 120/80 | HR 82 | Wt 224.0 lb

## 2014-07-09 DIAGNOSIS — R059 Cough, unspecified: Secondary | ICD-10-CM

## 2014-07-09 DIAGNOSIS — J069 Acute upper respiratory infection, unspecified: Secondary | ICD-10-CM

## 2014-07-09 NOTE — Progress Notes (Signed)
CC: "dripping down throat and nose, cough"    HPI:  Jon [name] is a 80 y.o.male here for eval of URI      Per patient:    1.  URI - for the past 2 weeks, has had "dripping down nose and throat", dryness in throat, coughing up yellow phlegm - had been worse, got better but persistent.  Having nausea, no vomiting.  No HA, no f/c, no SOB, no diarrhea.  Reports taking some Nyquil (twice), but no NSAIDs, staying hydrated.  Had been using Vicks rub under the nose which helps a bit.        Review of systems:   (+) fatigue  No fever  No headache  No chest pain  No shortness of breath  (+) cough and PND  No change of appetite  No nausea  No vomiting  No abdominal pain  No stool changes  No urinary symptoms  No dizziness or lightheadedness  No depression  No skin rashes      Past Medical History   Diagnosis Date    DM type 2 (diabetes mellitus, type 2)     Paget's disease of bone     Protein S deficiency      On chronic anticoagulation    BPH (benign prostatic hypertrophy)     Sinus bradycardia     Dementia      mild    Arthritis     Varicella      Past Surgical History   Procedure Laterality Date    Skin graft      Joint replacement      Cholecystectomy      Knee replacement      Tonsillectomy         Allergies:   Allergies   Allergen Reactions    Bee Venom Other (See Comments)     Red and swelling    No Known Latex Allergy      Created by Conversion - 0;     Insects [Other] Other (See Comments)     Red and swelling     No family history on file.  History     Social History    Marital Status: Married     Spouse Name: N/A     Number of Children: N/A    Years of Education: N/A     Occupational History    Former Personal assistant, used to work night shift      Social History Main Topics    Smoking status: Never Smoker     Smokeless tobacco: Never Used    Alcohol Use: No    Drug Use: No    Sexual Activity: None     Other Topics Concern    None     Social History Narrative    2 daughters with neurological  issues       Current Outpatient Prescriptions   Medication Sig Dispense Refill    trospium (SANCTURA XR) extended release capsule Take 1 capsule (60 mg total) by mouth daily (before breakfast) 90 capsule 3    meloxicam (MOBIC) 7.5 MG tablet TAKE 1 TABLET BY MOUTH ONE TIME DAILY 30 tablet 3    JANUVIA 100 MG tablet TAKE 1 TABLET BY MOUTH ONE TIME DAILY 90 tablet 3    warfarin (COUMADIN) 4 MG tablet Take 1 tablet (4 mg total) by mouth daily 30 tablet 5    COUMADIN 2.5 MG tablet TAKE TWO TABLETS BY MOUTH ON MONDAY AND THURSDAY. TAKE  ONE AND ONE HALF TABLETS REST OF DAYS OR AS DIRECTED 138 tablet 2    metFORMIN (GLUCOPHAGE) 1000 MG tablet TAKE 1 TABLET BY MOUTH TWO TIMES DAILY WITH FOOD 180 tablet 4    escitalopram (LEXAPRO) 5 MG tablet Take 1 tablet (5 mg total) by mouth daily 30 tablet 5    glimepiride (AMARYL) 2 MG tablet TAKE 1 TABLET BY MOUTH ONE TIME DAILY WITH BREAKFAST 90 tablet 3    pravastatin (PRAVACHOL) 40 MG tablet TAKE ONE TABLET BY MOUTH ONCE DAILY 90 tablet 5    finasteride (PROSCAR) 5 MG tablet TAKE ONE TABLET BY MOUTH ONCE DAILY FOR USE BY MEN ONLY 90 tablet 1    fluocinonide-emollient (LIDEX-E) 0.05 % cream Apply topically 2 times daily 30 g 2    donepezil (ARICEPT) 10 MG tablet 1 tab daily 90 tablet 3    memantine (NAMENDA) 10 MG tablet Take 1 Tablet by mouth Twice a Day 180 tablet 3    gabapentin (NEURONTIN) 300 MG capsule TAKE ONE CAPSULE BY MOUTH TWICE A DAY 180 capsule 0    lancets (ONETOUCH ULTRASOFT) Use   2  times per day as instructed for blood glucose testing. 100 each 3    blood glucose (ONE TOUCH) test strip To be used twice daily 100 strip 1    blood glucose monitor (ONE TOUCH ULTRA SYSTEM) kit Use as instructed 1 each 0    Blood Glucose Calibration (OT ULTRA/FASTTK CNTRL SOLN) SOLN As directed 1 each 5    CINNAMON PO Take by mouth      cyanocobalamin (VITAMIN B-12) 1000 MCG tablet Take 1,000 mcg by mouth daily      Co-Enzyme Q-10 100 MG CAPS Take by mouth       omega-3 acid ethyl esters (LOVAZA) 1 GM capsule Take 2 g by mouth 2 times daily   Swallow whole. Do not crush or chew.      Multiple Vitamins-Minerals (MULTI VITAMIN/MINERALS PO) Take by mouth      HYDROcodone-acetaminophen (VICODIN) 5-500 MG per tablet 1-2 po q 6 hours prn pain MDD=6 180 tablet 1    cholecalciferol (VITAMIN D) 1000 UNIT tablet Take 2,000 Units by mouth daily          FISH OIL by Does not apply route daily.      acetaminophen (TYLENOL) 500 MG tablet Take 500 mg by mouth 2 times daily.       No current facility-administered medications for this visit.         Physical Exam:  Filed Vitals:    07/09/14 1203   BP: 120/80   Pulse: 82   Weight: 101.606 kg (224 lb)     Constitutional: Mildly comfortable, no apparent distress, obese   Eyes:  Equal, round and reactive, conjunctivae clear, lids normal  ENMT:   Moist mucous membranes, mild oropharyngeal/postnasal discharge seen, good dentition but with swelling in left TMJ region, hearing intact.  Cerumen was removed from bilateral ears with the use of an illuminated curette and otoscope.   Resp:  Unlabored inspiratory effort, lungs clear to auscultation bilaterally, no wheezes, no rhonchi   CV:    Regular S1S2, no murmurs, no peripheral edema   Abd:  Abdomen soft, non-tender, not distended, bowel sounds present, no hepatosplenomegaly   MSK: No joint tenderness or inflammation   Skin: Warm, dry, good turgor, no rashes    Neuro:  Speech intact, no motor or sensory deficits   Psych:   Alert and oriented x3,  normal affect and mood       Assessment:  80 y.o.male today presenting for postnasal discharge and cough      Plan:    1.  URI - postnasal discharge and cough -    - advised to stay well hydrated    - tylenol 500-650m every 4-6 hours as needed, can alternate w/ibuprofen 400-6058mevery 6 hours (with food)   - Robitussin over-the-counter 4 times a day as needed   - Nasal saline ("Sea Mist") 4 times a day as needed for nasal congestion    - use a  decongestant (such as Sudafed)    - No antibiotics at this time, but if not improved by early next week will start azithromycin   - patient advised to go to ED if significant worsening over weekend    2.  Swelling in left jaw -    - patient to contact his dentist about this - ?TMJ      Follow up with [place]    LiJacklyn ShellMD  Internal Medicine

## 2014-07-09 NOTE — Patient Instructions (Signed)
For your postnasal discharge and cough -      - Please stay well hydrated      - tylenol 500-650mg  every 4-6 hours as needed, can alternate w/ibuprofen 400-600mg  every 6 hours (with food) - this can affect your INR     - Robitussin over-the-counter 4 times a day as needed     - Nasal saline ("Sea Mist") 4 times a day as needed for nasal congestion      - can use a decongestant (such as Sudafed)      - No antibiotics at this time, but if not improved by early next week - let us know

## 2014-07-19 ENCOUNTER — Encounter: Payer: Self-pay | Admitting: Primary Care

## 2014-07-19 ENCOUNTER — Ambulatory Visit
Admit: 2014-07-19 | Discharge: 2014-07-19 | Disposition: A | Discharge status: Home / Self Care | Payer: Self-pay | Source: Ambulatory Visit | Attending: Primary Care | Admitting: Primary Care

## 2014-07-19 ENCOUNTER — Ambulatory Visit: Payer: Self-pay | Admitting: Primary Care

## 2014-07-19 VITALS — BP 110/68 | HR 72 | Wt 228.0 lb

## 2014-07-19 DIAGNOSIS — M79604 Pain in right leg: Secondary | ICD-10-CM

## 2014-07-19 DIAGNOSIS — R079 Chest pain, unspecified: Secondary | ICD-10-CM

## 2014-07-19 DIAGNOSIS — I82409 Acute embolism and thrombosis of unspecified deep veins of unspecified lower extremity: Secondary | ICD-10-CM

## 2014-07-19 DIAGNOSIS — M79601 Pain in right arm: Secondary | ICD-10-CM

## 2014-07-19 DIAGNOSIS — F068 Other specified mental disorders due to known physiological condition: Secondary | ICD-10-CM

## 2014-07-19 LAB — PROTIME-INR
INR: 2.4 — ABNORMAL HIGH (ref 1.0–1.2)
Protime: 26.2 s — ABNORMAL HIGH (ref 9.2–12.3)

## 2014-07-19 MED ORDER — CYCLOBENZAPRINE HCL 5 MG PO TABS *I*
5.0000 mg | ORAL_TABLET | Freq: Every evening | ORAL | Status: DC | PRN
Start: 2014-07-19 — End: 2015-05-10

## 2014-07-19 NOTE — Progress Notes (Signed)
CC: "left side pain"    HPI:  Jon [name] is a 80 y.o.male here for f/u of recent fall with left sided pain      Per patient:    1.  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  Denies LOC or trauma to the head.  Saw [facility] 8 days ago, X-rays without fractures - still with rib pain, left arm and left knee with tenderness.  Has been using ibuprofen 218m every 4 hours.  Vicodin "sets me up like a Mack truck", hasn't been using it.        Review of systems:   No fever  No headache  (+) left sided chest pain  No shortness of breath  No cough  No change of appetite  No nausea  No vomiting  No abdominal pain  No stool changes  No urinary symptoms  No dizziness or lightheadedness  No depression  (+) skin changes (hematomas)  (+) left arm and leg pain (after fall 9 days ago)      Past Medical History   Diagnosis Date    DM type 2 (diabetes mellitus, type 2)     Paget's disease of bone     Protein S deficiency      On chronic anticoagulation    BPH (benign prostatic hypertrophy)     Sinus bradycardia     Dementia      mild    Arthritis     Varicella      Past Surgical History   Procedure Laterality Date    Skin graft      Joint replacement      Cholecystectomy      Knee replacement      Tonsillectomy         Allergies:   Allergies   Allergen Reactions    Bee Venom Other (See Comments)     Red and swelling    No Known Latex Allergy      Created by Conversion - 0;     Insects [Other] Other (See Comments)     Red and swelling     No family history on file.  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 TAKE 1 AND 1/2 TABLETS BY MOUTH THE REST OF DAYS OR AS DIRECTED 138 tablet 4    trospium (SANCTURA XR) extended release capsule Take 1 capsule (60 mg total) by mouth daily (before breakfast) 90 capsule 3    meloxicam (MOBIC) 7.5 MG tablet TAKE 1 TABLET BY MOUTH ONE TIME DAILY 30 tablet 3  JANUVIA 100 MG tablet TAKE 1 TABLET BY MOUTH ONE TIME DAILY 90 tablet 3    warfarin (COUMADIN) 4 MG tablet Take 1 tablet (4 mg total) by mouth daily 30 tablet 5    COUMADIN 2.5 MG tablet TAKE TWO TABLETS BY MOUTH ON MONDAY AND THURSDAY. TAKE ONE AND ONE HALF TABLETS REST OF DAYS OR AS DIRECTED 138 tablet 2    metFORMIN (GLUCOPHAGE) 1000 MG tablet TAKE 1 TABLET BY MOUTH TWO TIMES DAILY WITH FOOD 180 tablet 4    escitalopram (LEXAPRO) 5 MG tablet Take 1 tablet (5 mg total) by mouth daily 30 tablet 5    glimepiride (AMARYL) 2 MG tablet TAKE 1 TABLET BY MOUTH ONE TIME DAILY WITH BREAKFAST 90 tablet 3    pravastatin (PRAVACHOL) 40 MG tablet TAKE ONE TABLET BY MOUTH ONCE DAILY 90 tablet 5    finasteride (PROSCAR) 5 MG tablet TAKE ONE TABLET BY MOUTH ONCE DAILY FOR USE BY MEN ONLY 90 tablet 1    fluocinonide-emollient (LIDEX-E) 0.05 % cream Apply topically 2 times daily 30 g 2    donepezil (ARICEPT) 10 MG tablet 1 tab daily 90 tablet 3    memantine (NAMENDA) 10 MG tablet Take 1 Tablet by mouth Twice a Day 180 tablet 3    gabapentin (NEURONTIN) 300 MG capsule TAKE ONE CAPSULE BY MOUTH TWICE A DAY 180 capsule 0    lancets (ONETOUCH ULTRASOFT) Use   2  times per day as instructed for blood glucose testing. 100 each 3    blood glucose (ONE TOUCH) test strip To be used twice daily 100 strip 1    blood glucose monitor (ONE TOUCH ULTRA SYSTEM) kit Use as instructed 1 each 0    Blood Glucose  Calibration (OT ULTRA/FASTTK CNTRL SOLN) SOLN As directed 1 each 5    CINNAMON PO Take by mouth      cyanocobalamin (VITAMIN B-12) 1000 MCG tablet Take 1,000 mcg by mouth daily      Co-Enzyme Q-10 100 MG CAPS Take by mouth      omega-3 acid ethyl esters (LOVAZA) 1 GM capsule Take 2 g by mouth 2 times daily   Swallow whole. Do not crush or chew.      Multiple Vitamins-Minerals (MULTI VITAMIN/MINERALS PO) Take by mouth      HYDROcodone-acetaminophen (VICODIN) 5-500 MG per tablet 1-2 po q 6 hours prn pain MDD=6 180 tablet 1    cholecalciferol (VITAMIN D) 1000 UNIT tablet Take 2,000 Units by mouth daily          FISH OIL by Does not apply route daily.      acetaminophen (TYLENOL) 500 MG tablet Take 500 mg by mouth 2 times daily.       No current facility-administered medications for this visit.         Physical Exam:  Filed Vitals:    07/19/14 1144   BP: 110/68   Pulse: 72   Weight: 103.42 kg (228 lb)     Constitutional: Comfortable, no apparent distress, overweight   Eyes:  Equal, round and reactive, conjunctivae clear, lids normal  ENMT:   Moist mucous membranes, no oropharyngeal lesions, good dentition, hearing intact    Resp:  Unlabored inspiratory effort, lungs clear to auscultation bilaterally, no wheezes, no rhonchi   CV:    Regular S1S2, no murmurs, no peripheral edema   Abd:  Abdomen soft, non-tender, not distended, bowel sounds present, no hepatosplenomegaly   MSK: Tenderness in left elbow, left lower chest  and left knee (laterally) on palpation, with adjacent hematomas   Skin: Warm, dry, good turgor, hematomas on left lower chest/hip, left lateral leg (below knee)    Neuro:  Speech intact, no motor or sensory deficits   Psych:   Alert and oriented x3, normal affect and mood       Assessment:  80 y.o.male today presenting for trauma to left side of body after fall 9 days ago        Plan:    1.  Left side of body trauma - after fall 9 days ago; on coumadin   - ibuprofen PRN   - will give Rx for  flexeril 78m qHS   - check INR in 1 week (if possible on vacation) - as is on coumadin   - do not feel the need at this time to check Hct, as his hematomas are not very large   - patient to let [place] if needs to see PT    2.  Wife with concerns about memory - already on namenda/aricept - will discuss next visit    Follow up with [place]    LJacklyn Shell MD  Internal Medicine

## 2014-07-19 NOTE — Patient Instructions (Addendum)
1.  Ibuprofen (Advil) - maximum of 2400 mg daily, divided either as 400-600 mg (2-3 pills) every 6 hours or 800 mg (4 pills) every 8 hours.  Please take with food.    2.  Consider checking your INR next week if you're increasing the ibuprofen    3.  You can use the flexeril (cyclobenzaprine) 5mg  (maximum dose is 2 pills, 10mg ), at bedtime, up to 3 times a day - this can make you drowsy, so please do not operate any machinery    4.  Let us know if you want to see physical therapy - if symptoms are not going away

## 2014-07-23 ENCOUNTER — Telehealth: Payer: Self-pay | Admitting: Primary Care

## 2014-07-23 NOTE — Telephone Encounter (Signed)
Left message on voice mail to have his INR done.

## 2014-07-26 ENCOUNTER — Ambulatory Visit
Admit: 2014-07-26 | Discharge: 2014-07-26 | Disposition: A | Discharge status: Home / Self Care | Payer: Self-pay | Source: Ambulatory Visit | Attending: Primary Care | Admitting: Primary Care

## 2014-07-26 DIAGNOSIS — I82409 Acute embolism and thrombosis of unspecified deep veins of unspecified lower extremity: Secondary | ICD-10-CM

## 2014-07-26 LAB — PROTIME-INR
INR: 2.6 — ABNORMAL HIGH (ref 1.0–1.2)
Protime: 28.9 s — ABNORMAL HIGH (ref 9.2–12.3)

## 2014-07-27 ENCOUNTER — Ambulatory Visit: Payer: Self-pay

## 2014-07-27 DIAGNOSIS — E8809 Other disorders of plasma-protein metabolism, not elsewhere classified: Secondary | ICD-10-CM

## 2014-07-27 NOTE — Patient Instructions (Signed)
Called and spoke with [name] and reviewed INR and to have it repeated in four weeks.

## 2014-07-27 NOTE — Telephone Encounter (Signed)
INR done and addressed see flow sheet

## 2014-07-28 ENCOUNTER — Other Ambulatory Visit: Payer: Self-pay | Admitting: Primary Care

## 2014-08-11 ENCOUNTER — Other Ambulatory Visit: Payer: Self-pay | Admitting: Primary Care

## 2014-08-11 MED ORDER — FINASTERIDE 5 MG PO TABS *I*
ORAL_TABLET | ORAL | Status: DC
Start: 2014-08-11 — End: 2014-11-23

## 2014-08-30 ENCOUNTER — Telehealth: Payer: Self-pay | Admitting: Primary Care

## 2014-08-30 NOTE — Telephone Encounter (Signed)
Spoke with [name] and she will ask [name] to have his INR done tomorrow.

## 2014-08-31 ENCOUNTER — Ambulatory Visit
Admit: 2014-08-31 | Discharge: 2014-08-31 | Disposition: A | Discharge status: Home / Self Care | Payer: Self-pay | Source: Ambulatory Visit | Attending: Primary Care | Admitting: Primary Care

## 2014-08-31 DIAGNOSIS — I82409 Acute embolism and thrombosis of unspecified deep veins of unspecified lower extremity: Secondary | ICD-10-CM

## 2014-08-31 LAB — PROTIME-INR
INR: 2.2 — ABNORMAL HIGH (ref 1.0–1.2)
Protime: 24.7 s — ABNORMAL HIGH (ref 9.2–12.3)

## 2014-09-01 ENCOUNTER — Ambulatory Visit: Payer: Self-pay

## 2014-09-01 ENCOUNTER — Telehealth: Payer: Self-pay | Admitting: Primary Care

## 2014-09-01 DIAGNOSIS — I82409 Acute embolism and thrombosis of unspecified deep veins of unspecified lower extremity: Secondary | ICD-10-CM

## 2014-09-01 DIAGNOSIS — E8809 Other disorders of plasma-protein metabolism, not elsewhere classified: Secondary | ICD-10-CM

## 2014-09-01 NOTE — Telephone Encounter (Signed)
Needs a new standing order

## 2014-09-01 NOTE — Patient Instructions (Signed)
Spoke with [name] and reviewed INR and continue 4 mg on Sundays and 3.75 mg other 6 days of the week and recheck INR again on 10/14.

## 2014-09-01 NOTE — Telephone Encounter (Signed)
INR done and addressed, see flow sheet

## 2014-09-08 ENCOUNTER — Encounter: Payer: Self-pay | Admitting: Urology

## 2014-09-08 ENCOUNTER — Ambulatory Visit: Payer: Self-pay | Admitting: Urology

## 2014-09-08 VITALS — BP 139/67 | HR 84 | Resp 14 | Ht 72.0 in | Wt 225.0 lb

## 2014-09-08 DIAGNOSIS — N400 Enlarged prostate without lower urinary tract symptoms: Secondary | ICD-10-CM

## 2014-09-08 DIAGNOSIS — R21 Rash and other nonspecific skin eruption: Secondary | ICD-10-CM

## 2014-09-08 DIAGNOSIS — R399 Unspecified symptoms and signs involving the genitourinary system: Secondary | ICD-10-CM | POA: Insufficient documentation

## 2014-09-08 LAB — POCT URINALYSIS DIPSTICK
Bilirubin,Ur: NEGATIVE
Blood,UA POCT: NEGATIVE
Glucose,UA POCT: 1000 — AB
Ketones,UA POCT: NEGATIVE
Leuk Esterase,UA POCT: NEGATIVE
Lot #: 23349003
Nitrite,UA POCT: NEGATIVE
PH,UA POCT: 5 (ref 5–8)
Protein,UA POCT: NEGATIVE mg/dL
Specific gravity,UA POCT: 1.025 (ref 1.002–1.03)
Urobilinogen,UA: NORMAL

## 2014-09-08 MED ORDER — MIRABEGRON ER 25 MG PO TB24 *I*
25.0000 mg | ORAL_TABLET | Freq: Every day | ORAL | Status: DC
Start: 2014-09-08 — End: 2014-11-23

## 2014-09-08 NOTE — Progress Notes (Signed)
[facility] Urology Follow Up Visit    KARLON SCHLAFER  09/08/2014    Chief complaint : bph f/u  Pt is accompanied by his wife  HPI: occasional urinary frequency and urgency previously well controlled on combination regimen but no longer working. BGs not well controlled per pt  I reviewed the strengths and weaknesses of PSA as a screening test for prostate cancer. We reviewed the recent controversies surrounding PSA screening and the recent recommendation by the USPSTF that men in this [place] no longer be screened with PSA. We discussed the fact the the AUA has a different feeling about PSA screening and takes issue with the USPSTF findings and recommendations. The patient considered these issues and has decided that he no longer wishes to have further PSA levels drawn.   He also complains of a recurring rash on the head of his uncircumcised penis which has been intermittent over the past 20 months or so. He retract the foreskin to wash 2 times per week. He reports that it clears for a time and then returns. He never f/u with dermatology referral given to him last year    Changes since prior visit :   PMH - no change  PSH - no change  Medications - no change  Allergies - no change  ROS - no change    Physical Exam :   General appearance - well appearing, nad  Back - no cvat bilaterally  Abdomen - soft, no masses, no hepatosplenomegaly, non-distended bladder, no inguinal hernia, no tenderness  Lymph nodes - no lad  Extremities/pulses -nl  Genitourinary - Male -  Penis -  normal, uncircumcized and mild papular erythema at corona of penis and head of the glans   Urethral Meatus -  Normal  Scrotum -  Normal  Right and left testes - Normal  Right and left epididymis -  Normal  Anus and Perineum -  normal and No masses    Studies reviewed -   Component      Latest Ref Rng 09/08/2014   PH,UA POCT      5 - 8 5.0   Leuk Esterase,UA POCT      Negative Negative   Nitrite,UA POCT      Negative Negative   Protein,UA POCT       Negative mg/dL Negative   Glucose,UA POCT      Normal 1000 mg/dL (A)   Ketones,UA POCT      Negative Negative   Urobilinogen,UA       Normal   Bilirubin,Ur      Negative Negative   Blood,UA POCT      Negative Negative     Diagnosis established -BPH with LUTS, trospium no longer helping - given pt's memory issues and h/o vascular dementia; glucosuria  Plan of care -myrbetriq Rx sent in; better control of sugars, pt would like paper Rx for meds when he goes to [place] for the winter in December

## 2014-09-23 ENCOUNTER — Ambulatory Visit: Payer: Self-pay

## 2014-09-29 ENCOUNTER — Ambulatory Visit: Payer: Self-pay | Admitting: Primary Care

## 2014-09-29 ENCOUNTER — Ambulatory Visit
Admit: 2014-09-29 | Discharge: 2014-09-29 | Disposition: A | Discharge status: Home / Self Care | Payer: Self-pay | Source: Ambulatory Visit | Attending: Primary Care | Admitting: Primary Care

## 2014-09-29 DIAGNOSIS — I82409 Acute embolism and thrombosis of unspecified deep veins of unspecified lower extremity: Secondary | ICD-10-CM

## 2014-09-29 LAB — PROTIME-INR
INR: 1.9 — ABNORMAL HIGH (ref 1.0–1.2)
Protime: 22 s — ABNORMAL HIGH (ref 9.2–12.3)

## 2014-09-29 NOTE — Patient Instructions (Signed)
Left a message on machine with instructions

## 2014-09-29 NOTE — Progress Notes (Signed)
Quick Note:    ORDER:  inr not at goal--a touch low  Adjust warfarin --confirm current dose then take 5 mg tonight only followed by 4 mg every Sunday and Wednesday 3.75 mg the other 5 days  Repeat in one week  ______

## 2014-10-13 ENCOUNTER — Ambulatory Visit
Admit: 2014-10-13 | Discharge: 2014-10-13 | Disposition: A | Discharge status: Home / Self Care | Payer: Self-pay | Source: Ambulatory Visit | Attending: Primary Care | Admitting: Primary Care

## 2014-10-13 ENCOUNTER — Ambulatory Visit: Payer: Self-pay | Admitting: Primary Care

## 2014-10-13 DIAGNOSIS — I82409 Acute embolism and thrombosis of unspecified deep veins of unspecified lower extremity: Secondary | ICD-10-CM

## 2014-10-13 LAB — PROTIME-INR
INR: 1.5 — ABNORMAL HIGH (ref 1.0–1.2)
Protime: 17 s — ABNORMAL HIGH (ref 9.2–12.3)

## 2014-10-13 NOTE — Patient Instructions (Signed)
Reviewed with [name].  She repeated instructions.

## 2014-10-27 ENCOUNTER — Encounter: Payer: Self-pay | Admitting: Primary Care

## 2014-10-27 ENCOUNTER — Ambulatory Visit: Payer: Self-pay

## 2014-10-27 ENCOUNTER — Ambulatory Visit
Admit: 2014-10-27 | Discharge: 2014-10-27 | Disposition: A | Discharge status: Home / Self Care | Payer: Self-pay | Source: Ambulatory Visit | Attending: Primary Care | Admitting: Primary Care

## 2014-10-27 DIAGNOSIS — I82409 Acute embolism and thrombosis of unspecified deep veins of unspecified lower extremity: Secondary | ICD-10-CM

## 2014-10-27 LAB — PROTIME-INR
INR: 2.8 — ABNORMAL HIGH (ref 1.0–1.2)
Protime: 32.4 s — ABNORMAL HIGH (ref 9.2–12.3)

## 2014-10-27 NOTE — Telephone Encounter (Signed)
-----   Message from Edythe Clarity, MD sent at 10/27/2014 11:35 AM EST -----  INR in target range - have patient continue current dose of coumadin, and recheck INR in 4 weeks.  Thank you!

## 2014-10-27 NOTE — Patient Instructions (Signed)
Spoke with [name] and to continue same dose of coumadin and repeat again on 12/9

## 2014-11-04 ENCOUNTER — Other Ambulatory Visit: Payer: Self-pay | Admitting: Primary Care

## 2014-11-15 ENCOUNTER — Other Ambulatory Visit: Payer: Self-pay | Admitting: Primary Care

## 2014-11-23 ENCOUNTER — Ambulatory Visit
Admit: 2014-11-23 | Discharge: 2014-11-23 | Disposition: A | Discharge status: Home / Self Care | Payer: Self-pay | Source: Ambulatory Visit | Attending: Primary Care | Admitting: Primary Care

## 2014-11-23 ENCOUNTER — Encounter: Payer: Self-pay | Admitting: Primary Care

## 2014-11-23 ENCOUNTER — Other Ambulatory Visit: Payer: Self-pay | Admitting: Urology

## 2014-11-23 ENCOUNTER — Telehealth: Payer: Self-pay | Admitting: Urology

## 2014-11-23 DIAGNOSIS — N390 Urinary tract infection, site not specified: Secondary | ICD-10-CM

## 2014-11-23 DIAGNOSIS — E7259 Other disorders of glycine metabolism: Secondary | ICD-10-CM

## 2014-11-23 DIAGNOSIS — E119 Type 2 diabetes mellitus without complications: Secondary | ICD-10-CM

## 2014-11-23 DIAGNOSIS — I82409 Acute embolism and thrombosis of unspecified deep veins of unspecified lower extremity: Secondary | ICD-10-CM

## 2014-11-23 LAB — PROTIME-INR
INR: 2.1 — ABNORMAL HIGH (ref 1.0–1.2)
Protime: 23.5 s — ABNORMAL HIGH (ref 9.2–12.3)

## 2014-11-23 LAB — URINALYSIS REFLEX TO CULTURE
Blood,UA: NEGATIVE
Glucose,UA: >=500 mg/dL — AB
Ketones, UA: NEGATIVE
Leuk Esterase,UA: NEGATIVE
Nitrite,UA: NEGATIVE
Protein,UA: NEGATIVE mg/dL
Specific Gravity,UA: 1.027 (ref 1.002–1.030)
pH,UA: 5 (ref 5.0–8.0)

## 2014-11-23 LAB — MULTIPLE ORDERING DOCS

## 2014-11-23 MED ORDER — MIRABEGRON ER 25 MG PO TB24 *I*
25.0000 mg | ORAL_TABLET | Freq: Every day | ORAL | Status: DC
Start: 2014-11-23 — End: 2015-09-26

## 2014-11-23 MED ORDER — FINASTERIDE 5 MG PO TABS *I*
ORAL_TABLET | ORAL | Status: DC
Start: 2014-11-23 — End: 2015-11-16

## 2014-11-23 NOTE — Telephone Encounter (Signed)
Patient's wife states they will be leaving town for four months on 12/05/14. She would like hard copy prescriptions mailed to their home address for patient's Finasteride 5mg  and Myrbetriq 25mg . Please write the prescription for enough medication for at least 4 months.     [name] also wanted to inform Dr. Werner Lean that patient has been "having problems with frequency and urgency, going almost every hour and leaking quite a bit for the past month or so". If any advice, please call [phone].

## 2014-11-23 NOTE — Telephone Encounter (Signed)
He's spilling large amounts of glucose in the urine.  His urinary frequency complaints may be due to uncontrolled DM.  Please have him get an SMA-8, CBC and HbA1c STAT.  Thank you!

## 2014-11-23 NOTE — Telephone Encounter (Signed)
Rx mailed today, called pt, no answer, message left

## 2014-11-23 NOTE — Telephone Encounter (Signed)
The patient's wife was called.  Prescriptions will bring her out.  She was turned that he was spilling a large amount of glucose in his urine in his last office visit and it is important to keep her sugars under better control.  A urinalysis with reflex to culture will be done.  She understands.

## 2014-11-24 ENCOUNTER — Telehealth: Payer: Self-pay | Admitting: Primary Care

## 2014-11-24 ENCOUNTER — Ambulatory Visit
Admit: 2014-11-24 | Discharge: 2014-11-24 | Disposition: A | Discharge status: Home / Self Care | Payer: Self-pay | Source: Ambulatory Visit | Attending: Primary Care | Admitting: Primary Care

## 2014-11-24 ENCOUNTER — Ambulatory Visit: Payer: Self-pay

## 2014-11-24 DIAGNOSIS — D6859 Other primary thrombophilia: Secondary | ICD-10-CM

## 2014-11-24 DIAGNOSIS — E7259 Other disorders of glycine metabolism: Secondary | ICD-10-CM

## 2014-11-24 DIAGNOSIS — E119 Type 2 diabetes mellitus without complications: Secondary | ICD-10-CM

## 2014-11-24 LAB — CBC AND DIFFERENTIAL
Baso # K/uL: 0 10*3/uL (ref 0.0–0.1)
Basophil %: 0.7 %
Eos # K/uL: 0.4 10*3/uL (ref 0.0–0.5)
Eosinophil %: 7.3 %
Hematocrit: 40 % (ref 40–51)
Hemoglobin: 13.4 g/dL — ABNORMAL LOW (ref 13.7–17.5)
IMM Granulocytes #: 0 10*3/uL (ref 0.0–0.1)
IMM Granulocytes: 0.3 %
Lymph # K/uL: 1.5 10*3/uL (ref 1.3–3.6)
Lymphocyte %: 26.5 %
MCH: 31 pg/cell (ref 26–32)
MCHC: 33 g/dL (ref 32–37)
MCV: 93 fL — ABNORMAL HIGH (ref 79–92)
Mono # K/uL: 0.5 10*3/uL (ref 0.3–0.8)
Monocyte %: 7.8 %
Neut # K/uL: 3.3 10*3/uL (ref 1.8–5.4)
Nucl RBC # K/uL: 0 10*3/uL (ref 0.0–0.0)
Nucl RBC %: 0 /100 WBC (ref 0.0–0.2)
Platelets: 115 10*3/uL — ABNORMAL LOW (ref 150–330)
RBC: 4.3 MIL/uL — ABNORMAL LOW (ref 4.6–6.1)
RDW: 12.9 % (ref 11.6–14.4)
Seg Neut %: 57.4 %
WBC: 5.8 10*3/uL (ref 4.2–9.1)

## 2014-11-24 LAB — COMPREHENSIVE METABOLIC PANEL
ALT: 41 U/L (ref 0–50)
AST: 36 U/L (ref 0–50)
Albumin: 4.2 g/dL (ref 3.5–5.2)
Alk Phos: 82 U/L (ref 40–130)
Anion Gap: 11 (ref 7–16)
Bilirubin,Total: 0.4 mg/dL (ref 0.0–1.2)
CO2: 25 mmol/L (ref 20–28)
Calcium: 9 mg/dL (ref 8.6–10.2)
Chloride: 103 mmol/L (ref 96–108)
Creatinine: 1.11 mg/dL (ref 0.67–1.17)
GFR,Black: 73 *
GFR,Caucasian: 63 *
Glucose: 214 mg/dL — ABNORMAL HIGH (ref 60–99)
Lab: 20 mg/dL (ref 6–20)
Potassium: 5.1 mmol/L (ref 3.3–5.1)
Sodium: 139 mmol/L (ref 133–145)
Total Protein: 6.9 g/dL (ref 6.3–7.7)

## 2014-11-24 LAB — HEMOGLOBIN A1C: Hemoglobin A1C: 9.4 % — ABNORMAL HIGH (ref 4.0–6.0)

## 2014-11-24 NOTE — Telephone Encounter (Signed)
Spoke with [name] and reviewed urine results and asked him to go today to have blood work done, he stated he would.

## 2014-11-24 NOTE — Telephone Encounter (Signed)
Needs printed standing order for INR for [facility], mailed to [employer] today

## 2014-11-24 NOTE — Patient Instructions (Signed)
Spoke with Jon [name] and reviewed INR with him and to continue 3.75mg  x4 days a week and 4mg  x3 days a week and recheck again in four weeks.  He is going to [facility] for winter, standing order for INR mailed to him to take to [facility].

## 2014-11-24 NOTE — Telephone Encounter (Signed)
-----   Message from Edythe Clarity, MD sent at 11/23/2014  9:59 PM EST -----  He's spilling large amounts of glucose in the urine.  His urinary frequency complaints may be due to uncontrolled DM.  Please have him get an SMA-8, CBC and HbA1c STAT.  Thank you!

## 2014-11-25 ENCOUNTER — Telehealth: Payer: Self-pay | Admitting: Primary Care

## 2014-11-25 DIAGNOSIS — E119 Type 2 diabetes mellitus without complications: Secondary | ICD-10-CM

## 2014-11-25 MED ORDER — GLIMEPIRIDE 4 MG PO TABS *I*
4.0000 mg | ORAL_TABLET | Freq: Every morning | ORAL | Status: DC
Start: 2014-11-25 — End: 2014-11-29

## 2014-11-25 NOTE — Telephone Encounter (Signed)
-----   Message from Edythe Clarity, MD sent at 11/24/2014  9:03 PM EST -----  His BG is not horribly elevated, however his HbA1c remarkably higher than usual - he's been having out of control sugars.  He can double up on his glimepiride to 4mg  daily, and please tell him to hydrate and decrease his carbohydrate intake (sugars and sweets, breads).  If this doesn't help his numbers (and symptoms of urinary issues), I would like him to see endocrinology.  Repeated HbA1c in 3 months, please place order, and please let me know what he'd like to do in light of his travel plans.

## 2014-11-25 NOTE — Telephone Encounter (Signed)
I can reprint the lab order.

## 2014-11-25 NOTE — Telephone Encounter (Signed)
[name] called back and was notified of [name]'s lab results, to have him double the glimepiride to 4 mg daily, new script done, and to repeat blood work in three months, lab req to be printed to be mailed to him as he is leaving 12/20 to [place].  Will do a hard copy script also to be mailed to him to take for [facility].

## 2014-11-25 NOTE — Telephone Encounter (Signed)
Orders signed, but not sure if they printed out from where I am... Please confirm.  Thanks

## 2014-11-25 NOTE — Telephone Encounter (Signed)
Left message on personal voice mail to call the office.

## 2014-11-29 ENCOUNTER — Other Ambulatory Visit: Payer: Self-pay | Admitting: Primary Care

## 2014-11-29 DIAGNOSIS — E119 Type 2 diabetes mellitus without complications: Secondary | ICD-10-CM

## 2014-11-29 MED ORDER — GLIMEPIRIDE 4 MG PO TABS *I*
4.0000 mg | ORAL_TABLET | Freq: Every morning | ORAL | Status: DC
Start: 2014-11-29 — End: 2014-12-01

## 2014-11-30 ENCOUNTER — Telehealth: Payer: Self-pay | Admitting: Primary Care

## 2014-11-30 DIAGNOSIS — E119 Type 2 diabetes mellitus without complications: Secondary | ICD-10-CM

## 2014-11-30 DIAGNOSIS — F0281 Dementia in other diseases classified elsewhere with behavioral disturbance: Secondary | ICD-10-CM

## 2014-11-30 DIAGNOSIS — F02818 Dementia in other diseases classified elsewhere, unspecified severity, with other behavioral disturbance: Secondary | ICD-10-CM

## 2014-11-30 DIAGNOSIS — G309 Alzheimer's disease, unspecified: Secondary | ICD-10-CM

## 2014-11-30 NOTE — Telephone Encounter (Signed)
Maggie's wife called wondering if you could print out some scripts for her husband. They are leaving for [place] this Sunday. She wants to have them so she can get them filled when they are  there. Here is the list of medications: Neurontin 300 mg, Glimerpiride 4 mg, Januvia 100 mg, lancets, meloxicam 7.5 mg, Namenda 10 mg, meformin '1000mg'$ , Pravastatin 40 mg, coumadin 4 mg, and 2.5 mg, and donepezil 10 mg. Please advise thanks

## 2014-11-30 NOTE — Telephone Encounter (Signed)
Not a problem, please place them as printed orders, will sign.  Thanks.     Another option would be to have it sent electronically to their pharmacy in [place].Marland KitchenMarland Kitchen

## 2014-12-01 ENCOUNTER — Other Ambulatory Visit: Payer: Self-pay | Admitting: Primary Care

## 2014-12-01 MED ORDER — GABAPENTIN 300 MG PO CAPSULE *I*
ORAL_CAPSULE | ORAL | Status: DC
Start: 2014-12-01 — End: 2015-11-07

## 2014-12-01 MED ORDER — PRAVASTATIN SODIUM 40 MG PO TABS *I*
40.0000 mg | ORAL_TABLET | Freq: Every day | ORAL | Status: DC
Start: 2014-12-01 — End: 2015-11-16

## 2014-12-01 MED ORDER — ONETOUCH ULTRASOFT LANCETS MISC *A*
Status: AC
Start: 2014-12-01 — End: ?

## 2014-12-01 MED ORDER — METFORMIN HCL 1000 MG PO TABS *I*
1000.0000 mg | ORAL_TABLET | Freq: Two times a day (BID) | ORAL | Status: DC
Start: 2014-12-01 — End: 2015-11-16

## 2014-12-01 MED ORDER — WARFARIN SODIUM 4 MG PO TABS *A*
4.0000 mg | ORAL_TABLET | Freq: Every day | ORAL | Status: DC
Start: 2014-12-01 — End: 2016-05-18

## 2014-12-01 MED ORDER — GLIMEPIRIDE 4 MG PO TABS *I*
4.0000 mg | ORAL_TABLET | Freq: Every morning | ORAL | Status: AC
Start: 2014-12-01 — End: 2015-05-30

## 2014-12-01 MED ORDER — SITAGLIPTIN PHOSPHATE 100 MG PO TABS *I*
100.0000 mg | ORAL_TABLET | Freq: Every day | ORAL | Status: DC
Start: 2014-12-01 — End: 2015-11-07

## 2014-12-01 MED ORDER — MELOXICAM 7.5 MG PO TABS *I*
7.5000 mg | ORAL_TABLET | Freq: Every day | ORAL | Status: DC
Start: 2014-12-01 — End: 2015-02-18

## 2014-12-01 MED ORDER — COUMADIN 2.5 MG PO TABS
ORAL_TABLET | ORAL | Status: DC
Start: 2014-12-01 — End: 2015-05-10

## 2014-12-01 MED ORDER — DONEPEZIL HCL 10 MG PO TABS *I*
ORAL_TABLET | ORAL | Status: DC
Start: 2014-12-01 — End: 2015-05-10

## 2014-12-01 MED ORDER — MEMANTINE HCL 10 MG PO TABS *I*
ORAL_TABLET | ORAL | Status: DC
Start: 2014-12-01 — End: 2015-11-16

## 2014-12-01 NOTE — Addendum Note (Signed)
Addended by: Jacklyn Shell on: 12/01/2014 01:14 PM     Modules accepted: Orders

## 2014-12-01 NOTE — Telephone Encounter (Signed)
Rx for Gabapentin 300mg  BID, Glimepiride 4mg  daily, Januvia 100mg  daily, lancets, meloxicam 7.5mg  daily, Namenda 10mg  BID, meformin 1000mg  BID, Pravastatin 40 mg daily, coumadin 4 mg and 2.5 mg, and donepezil 10 mg daily signed.

## 2014-12-01 NOTE — Addendum Note (Signed)
Addended by: Rhae Lerner on: 12/01/2014 10:03 AM     Modules accepted: Orders

## 2014-12-02 ENCOUNTER — Telehealth: Payer: Self-pay | Admitting: Primary Care

## 2014-12-02 NOTE — Telephone Encounter (Signed)
Called patient letting her know that her husband's scripts are being mailed out today. For her to take to [facility]. Thanks

## 2014-12-06 ENCOUNTER — Telehealth: Payer: Self-pay | Admitting: Primary Care

## 2014-12-06 NOTE — Telephone Encounter (Signed)
So Jon [name] called me and apparently the scripts that I mailed to her last week, she only received one. I put them all together in one envelope. Not sure where the others went to. Anyway she will be having [facility] in [place] call me for refills on those medications. They will be calling me this Wednesday, when they arrive. They are in [place] right now. Thanks I'm hoping that those scripts show up here or to her house eventually.

## 2014-12-06 NOTE — Telephone Encounter (Signed)
Let me know

## 2014-12-27 ENCOUNTER — Encounter: Payer: Self-pay | Admitting: Gastroenterology

## 2014-12-27 ENCOUNTER — Telehealth: Payer: Self-pay | Admitting: Primary Care

## 2014-12-27 LAB — PROTIME-INR: INR: 2.21

## 2014-12-27 NOTE — Telephone Encounter (Signed)
Spoke with [name] and he will get INR done.

## 2014-12-29 ENCOUNTER — Ambulatory Visit: Payer: Self-pay

## 2014-12-29 ENCOUNTER — Encounter: Payer: Self-pay | Admitting: Primary Care

## 2014-12-29 NOTE — Patient Instructions (Addendum)
Spoke with [name] and to continue 3.75mg  x4 days a week and 4mg  x3 days a week and recheck again on 2/10.

## 2014-12-29 NOTE — Telephone Encounter (Signed)
INR done and addressed.

## 2015-01-27 ENCOUNTER — Telehealth: Payer: Self-pay | Admitting: Primary Care

## 2015-01-27 ENCOUNTER — Encounter: Payer: Self-pay | Admitting: Primary Care

## 2015-01-27 ENCOUNTER — Encounter: Payer: Self-pay | Admitting: Gastroenterology

## 2015-01-27 LAB — PROTIME-INR: INR: 1.55

## 2015-01-27 NOTE — Telephone Encounter (Signed)
Spoke with [name] had them done 1/2 hour ago.

## 2015-01-28 ENCOUNTER — Ambulatory Visit: Payer: Self-pay

## 2015-01-28 NOTE — Patient Instructions (Signed)
[name] called and states that Jon [name] missed some days, will continue 4 mg on sun, Wed, Sat and 3.75 mg all the other days and recheck again in two weeks.

## 2015-02-08 ENCOUNTER — Encounter: Payer: Self-pay | Admitting: Gastroenterology

## 2015-02-08 ENCOUNTER — Encounter: Payer: Self-pay | Admitting: Primary Care

## 2015-02-08 ENCOUNTER — Ambulatory Visit: Payer: Self-pay | Admitting: Primary Care

## 2015-02-08 LAB — PROTIME-INR: INR: 1.74

## 2015-02-08 NOTE — Patient Instructions (Signed)
i called and left detailed message for patient, increase warfarin to 4 mg daily, next INR in 2 weeks

## 2015-02-18 ENCOUNTER — Other Ambulatory Visit: Payer: Self-pay | Admitting: Primary Care

## 2015-02-18 MED ORDER — MELOXICAM 7.5 MG PO TABS *I*
7.5000 mg | ORAL_TABLET | Freq: Every day | ORAL | Status: DC
Start: 2015-02-18 — End: 2015-03-19

## 2015-02-23 ENCOUNTER — Telehealth: Payer: Self-pay | Admitting: Primary Care

## 2015-02-23 ENCOUNTER — Encounter: Payer: Self-pay | Admitting: Gastroenterology

## 2015-02-23 LAB — PROTIME-INR: INR: 1.21

## 2015-02-23 NOTE — Telephone Encounter (Signed)
Spoke with [name] and he had it done this am.

## 2015-02-24 ENCOUNTER — Ambulatory Visit: Payer: Self-pay

## 2015-02-24 NOTE — Patient Instructions (Signed)
Left message on personal voice mail to take 5 mg on Thurs and Sun and 4 mg rest of week and recheck INR again on 3/15.

## 2015-02-24 NOTE — Telephone Encounter (Signed)
Makes no sense (and the flow sheet also is wrong, says 27/wk is current dose).  I can only take them at their word, and if taking 28 mg/wk would go to 30 mg/wk, with 5 mg Th/Sun, 4 mg other five days.  Repeat INR early next week, 3/14 or 3/15.

## 2015-02-24 NOTE — Telephone Encounter (Signed)
Called Jon [name], no new medications, wife states he is taking 4 mg every day, has not missed a dose, no dietary changes.  His INR is 1.2, was put into flowsheet as it was done in [facility] Please advise

## 2015-03-01 ENCOUNTER — Encounter: Payer: Self-pay | Admitting: Primary Care

## 2015-03-01 ENCOUNTER — Encounter: Payer: Self-pay | Admitting: Gastroenterology

## 2015-03-01 LAB — PROTIME-INR: INR: 1.85

## 2015-03-02 ENCOUNTER — Ambulatory Visit: Payer: Self-pay

## 2015-03-02 NOTE — Patient Instructions (Signed)
Spoke with [name] and he states no new changes and will go to 5 mg Mon, Wed, Fri and 4 mg rest of week and will recheck again in 2 weeks.

## 2015-03-16 ENCOUNTER — Telehealth: Payer: Self-pay | Admitting: Primary Care

## 2015-03-16 NOTE — Telephone Encounter (Signed)
Called [name] and he states that he will do his INR done on Friday

## 2015-03-17 ENCOUNTER — Encounter: Payer: Self-pay | Admitting: Gastroenterology

## 2015-03-17 LAB — PROTIME-INR: INR: 3.39

## 2015-03-18 ENCOUNTER — Encounter: Payer: Self-pay | Admitting: Primary Care

## 2015-03-18 ENCOUNTER — Ambulatory Visit: Payer: Self-pay

## 2015-03-18 NOTE — Telephone Encounter (Signed)
INR done and addressed.

## 2015-03-18 NOTE — Patient Instructions (Signed)
Left message on personal voice mail to change warfarin to 5 mg on Mon, Fri and 4 mg rest of week and recheck INR in two weeks.

## 2015-03-19 ENCOUNTER — Other Ambulatory Visit: Payer: Self-pay | Admitting: Primary Care

## 2015-03-29 ENCOUNTER — Ambulatory Visit: Payer: Self-pay

## 2015-03-29 ENCOUNTER — Encounter: Payer: Self-pay | Admitting: Primary Care

## 2015-03-29 ENCOUNTER — Encounter: Payer: Self-pay | Admitting: Gastroenterology

## 2015-03-29 LAB — PROTIME-INR: INR: 2.22

## 2015-03-29 NOTE — Patient Instructions (Signed)
Left message on personal voice mail with results of INR and to continue 5 mg Mon, Fri and 4 mg rest of week and recheck INR again in four weeks.

## 2015-04-13 ENCOUNTER — Other Ambulatory Visit: Payer: Self-pay | Admitting: Primary Care

## 2015-04-17 ENCOUNTER — Other Ambulatory Visit: Payer: Self-pay | Admitting: Primary Care

## 2015-04-25 ENCOUNTER — Encounter: Payer: Self-pay | Admitting: Urology

## 2015-04-26 ENCOUNTER — Ambulatory Visit
Admit: 2015-04-26 | Discharge: 2015-04-26 | Disposition: A | Discharge status: Home / Self Care | Payer: Self-pay | Source: Ambulatory Visit | Attending: Primary Care | Admitting: Primary Care

## 2015-04-26 ENCOUNTER — Telehealth: Payer: Self-pay | Admitting: Primary Care

## 2015-04-26 DIAGNOSIS — D6859 Other primary thrombophilia: Secondary | ICD-10-CM

## 2015-04-26 LAB — PROTIME-INR
INR: 2.9 — ABNORMAL HIGH (ref 1.0–1.2)
Protime: 33 s — ABNORMAL HIGH (ref 9.2–12.3)

## 2015-04-26 NOTE — Telephone Encounter (Signed)
Lab called asking for INR orders, they were placed.

## 2015-04-27 ENCOUNTER — Ambulatory Visit: Payer: Self-pay

## 2015-04-27 NOTE — Patient Instructions (Signed)
I called and notified patient of INR and dosing, he confirms current dose and states he understands no change and recheck in 4 weeks

## 2015-05-02 ENCOUNTER — Encounter: Payer: Self-pay | Admitting: Gastroenterology

## 2015-05-02 LAB — HM DIABETES EYE EXAM

## 2015-05-02 LAB — HM DIABETES FOOT EXAM

## 2015-05-04 ENCOUNTER — Encounter: Payer: Self-pay | Admitting: Primary Care

## 2015-05-10 ENCOUNTER — Encounter: Payer: Self-pay | Admitting: Primary Care

## 2015-05-10 ENCOUNTER — Telehealth: Payer: Self-pay | Admitting: Primary Care

## 2015-05-10 ENCOUNTER — Ambulatory Visit
Admit: 2015-05-10 | Discharge: 2015-05-10 | Disposition: A | Discharge status: Home / Self Care | Payer: Self-pay | Source: Ambulatory Visit | Attending: Primary Care | Admitting: Primary Care

## 2015-05-10 ENCOUNTER — Ambulatory Visit: Payer: Self-pay | Admitting: Primary Care

## 2015-05-10 VITALS — BP 134/72 | HR 70 | Wt 222.0 lb

## 2015-05-10 DIAGNOSIS — M889 Osteitis deformans of unspecified bone: Secondary | ICD-10-CM

## 2015-05-10 DIAGNOSIS — E1165 Type 2 diabetes mellitus with hyperglycemia: Secondary | ICD-10-CM

## 2015-05-10 DIAGNOSIS — F0390 Unspecified dementia without behavioral disturbance: Secondary | ICD-10-CM

## 2015-05-10 DIAGNOSIS — R7989 Other specified abnormal findings of blood chemistry: Secondary | ICD-10-CM

## 2015-05-10 DIAGNOSIS — D6859 Other primary thrombophilia: Secondary | ICD-10-CM

## 2015-05-10 DIAGNOSIS — M79669 Pain in unspecified lower leg: Secondary | ICD-10-CM

## 2015-05-10 DIAGNOSIS — E119 Type 2 diabetes mellitus without complications: Secondary | ICD-10-CM

## 2015-05-10 LAB — CBC AND DIFFERENTIAL
Baso # K/uL: 0 10*3/uL (ref 0.0–0.1)
Basophil %: 0.6 %
Eos # K/uL: 0.4 10*3/uL (ref 0.0–0.5)
Eosinophil %: 6.3 %
Hematocrit: 40 % (ref 40–51)
Hemoglobin: 13.4 g/dL — ABNORMAL LOW (ref 13.7–17.5)
IMM Granulocytes #: 0 10*3/uL (ref 0.0–0.1)
IMM Granulocytes: 0.5 %
Lymph # K/uL: 2 10*3/uL (ref 1.3–3.6)
Lymphocyte %: 30.7 %
MCH: 32 pg/cell (ref 26–32)
MCHC: 33 g/dL (ref 32–37)
MCV: 95 fL — ABNORMAL HIGH (ref 79–92)
Mono # K/uL: 0.5 10*3/uL (ref 0.3–0.8)
Monocyte %: 7.8 %
Neut # K/uL: 3.5 10*3/uL (ref 1.8–5.4)
Nucl RBC # K/uL: 0 10*3/uL (ref 0.0–0.0)
Nucl RBC %: 0 /100 WBC (ref 0.0–0.2)
Platelets: 111 10*3/uL — ABNORMAL LOW (ref 150–330)
RBC: 4.2 MIL/uL — ABNORMAL LOW (ref 4.6–6.1)
RDW: 13.1 % (ref 11.6–14.4)
Seg Neut %: 54.1 %
WBC: 6.4 10*3/uL (ref 4.2–9.1)

## 2015-05-10 LAB — LIPID PANEL
Chol/HDL Ratio: 3.1
Cholesterol: 143 mg/dL
HDL: 46 mg/dL
LDL Calculated: 62 mg/dL
Non HDL Cholesterol: 97 mg/dL
Triglycerides: 173 mg/dL — AB

## 2015-05-10 LAB — COMPREHENSIVE METABOLIC PANEL
ALT: 31 U/L (ref 0–50)
AST: 30 U/L (ref 0–50)
Albumin: 4.3 g/dL (ref 3.5–5.2)
Alk Phos: 71 U/L (ref 40–130)
Anion Gap: 13 (ref 7–16)
Bilirubin,Total: 0.3 mg/dL (ref 0.0–1.2)
CO2: 22 mmol/L (ref 20–28)
Calcium: 8.9 mg/dL (ref 8.6–10.2)
Chloride: 105 mmol/L (ref 96–108)
Creatinine: 0.91 mg/dL (ref 0.67–1.17)
GFR,Black: 92 *
GFR,Caucasian: 80 *
Glucose: 205 mg/dL — ABNORMAL HIGH (ref 60–99)
Lab: 19 mg/dL (ref 6–20)
Potassium: 4.5 mmol/L (ref 3.3–5.1)
Sodium: 140 mmol/L (ref 133–145)
Total Protein: 6.8 g/dL (ref 6.3–7.7)

## 2015-05-10 LAB — PROTIME-INR
INR: 2.7 — ABNORMAL HIGH (ref 1.0–1.2)
Protime: 31.4 s — ABNORMAL HIGH (ref 9.2–12.3)

## 2015-05-10 LAB — PCMH FALL RISK PLAN

## 2015-05-10 LAB — VITAMIN B12: Vitamin B12: 503 pg/mL (ref 211–946)

## 2015-05-10 LAB — TSH: TSH: 5.04 u[IU]/mL — ABNORMAL HIGH (ref 0.27–4.20)

## 2015-05-10 LAB — PCMH FALL RISK ASSESSMENT

## 2015-05-10 LAB — T4, FREE: Free T4: 0.9 ng/dL (ref 0.9–1.7)

## 2015-05-10 MED ORDER — FREESTYLE LITE TEST VI STRP *A*
ORAL_STRIP | Status: DC
Start: 2015-05-10 — End: 2016-07-30

## 2015-05-10 MED ORDER — FREESTYLE LITE DEVI *A*
Status: DC
Start: 2015-05-10 — End: 2019-11-19

## 2015-05-10 MED ORDER — DONEPEZIL HCL 10 MG PO TABS *I*
10.0000 mg | ORAL_TABLET | Freq: Every day | ORAL | Status: DC
Start: 2015-05-10 — End: 2015-11-16

## 2015-05-10 MED ORDER — GLUCOSE BLOOD VI STRP *A*
ORAL_STRIP | Status: DC
Start: 2015-05-10 — End: 2015-05-10

## 2015-05-10 NOTE — Telephone Encounter (Signed)
Left message for patient to call back to inform him of his appointment for his branchial ultra sound on 06-01-2015 at 10 am at [facility] on the 5th floor take the silver elevators.

## 2015-05-10 NOTE — Patient Instructions (Signed)
1.  Please have your bloodwork done today    2.  We are ordering an ultrasound of your leg blood vessels (ABI - ankle-brachial indices)    3.  Can make an appointment for acupuncture with: Mariann Barter [phone] - [address] [place] [address]    4.  [place], [place], [place] [address] - [phone]   - [email or web address]    5.  Let us know if your back pain/leg pain is getting worse

## 2015-05-10 NOTE — Progress Notes (Signed)
CC: "much better now that it's warmed up"    HPI:  Jon [name] is a 81 y.o.male here for f/u of various issues    Here with wife [name]).    Per patient:    1.  DM type 2 - last HbA1c=9.4% 11/2014, on amaryl 71m (glimiperide), januvia and metformin, and cutting back on sweets and junk food.  Has lost weight - 1 less pant size (2").      2.  Protein S - on coumadin chronically    3.  Pain/pressure in bilateral calves and ankles - also reports joints are "sore", as well as lower back.  Taking vicodin weekly  - "but I don't like taking this", also on gabapentin (2 pills qHS).  Played 18 holes, carts between holes.      4.  BP's elevated _0  - today WNL.    5.  Dementia - on aricept/namenda - wife notices that "it's not getting better" (memory clinic had accidentally enrolled his wife).        Review of systems:   No fever or chills  No headache  No sore throat  No chest pain  No shortness of breath  No cough  No change of appetite  No nausea  No vomiting  No abdominal pain  No stool changes  No urinary symptoms  No dizziness or lightheadedness  No depression  No skin rashes      Past Medical History   Diagnosis Date    DM type 2 (diabetes mellitus, type 2)     Paget's disease of bone     Protein S deficiency      On chronic anticoagulation    BPH (benign prostatic hypertrophy)     Sinus bradycardia     Dementia      mild    Arthritis     Varicella      Past Surgical History   Procedure Laterality Date    Skin graft      Joint replacement      Cholecystectomy      Knee replacement      Tonsillectomy         Allergies:   Allergies   Allergen Reactions    Bee Venom Other (See Comments)     Red and swelling    No Known Latex Allergy      Created by Conversion - 0;     Insects [Other] Other (See Comments)     Red and swelling     No family history on file.  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      Social History Main Topics    Smoking status: Never Smoker     Smokeless tobacco: Never Used    Alcohol Use: No    Drug Use: No    Sexual Activity: None     Other Topics Concern    None     Social History Narrative    2 daughters with neurological issues       Current Outpatient Prescriptions   Medication Sig Dispense Refill    meloxicam (MOBIC) 7.5 MG tablet TAKE 1 TABLET BY MOUTH EVERY DAY 30 tablet 3    gabapentin (GABAPENTIN) 300 MG capsule TAKE  ONE CAPSULE BY MOUTH TWICE A DAY 180 capsule 0    glimepiride (AMARYL) 4 MG tablet Take 1 tablet (4 mg total) by mouth every morning 90 tablet 3    sitaGLIPtin (JANUVIA) 100 MG tablet Take 1 tablet (100 mg total) by mouth daily   Swallow whole. Do not crush, break, or chew. 90 tablet 3    lancets (ONETOUCH ULTRASOFT) Use   2  times per day as instructed for blood glucose testing. 100 each 3    memantine (NAMENDA) 10 MG tablet Take 1 Tablet by mouth Twice a Day 180 tablet 3    metFORMIN (GLUCOPHAGE) 1000 MG tablet Take 1 tablet (1,000 mg total) by mouth 2 times daily (with meals) 180 tablet 4    pravastatin (PRAVACHOL) 40 MG tablet Take 1 tablet (40 mg total) by mouth daily 90 tablet 5    warfarin (COUMADIN) 4 MG tablet Take 1 tablet (4 mg total) by mouth daily 30 tablet 5    finasteride (PROSCAR) 5 MG tablet TAKE ONE TABLET BY MOUTH ONCE DAILY FOR USE BY MEN ONLY 90 tablet 3    NAMENDA 10 MG tablet TAKE 1 TABLET BY MOUTH TWO TIMES DAILY 180 tablet 3    COUMADIN 2.5 MG tablet TAKE 2 TABLETS BY MOUTH ON MONDAY AND THURSDAY. TAKE 1 AND 1/2 TABLETS BY MOUTH THE REST OF DAYS OR AS DIRECTED 138 tablet 4    escitalopram (LEXAPRO) 5 MG tablet Take 1 tablet (5 mg total) by mouth daily 30 tablet 5    fluocinonide-emollient (LIDEX-E) 0.05 % cream Apply topically 2 times daily 30 g 2    blood glucose (ONE TOUCH) test strip To be used twice daily 100 strip 1    blood glucose monitor (ONE  TOUCH ULTRA SYSTEM) kit Use as instructed 1 each 0    Blood Glucose Calibration (OT ULTRA/FASTTK CNTRL SOLN) SOLN As directed 1 each 5    omega-3 acid ethyl esters (LOVAZA) 1 GM capsule Take 2 g by mouth 2 times daily   Swallow whole. Do not crush or chew.      Multiple Vitamins-Minerals (MULTI VITAMIN/MINERALS PO) Take by mouth      cholecalciferol (VITAMIN D) 1000 UNIT tablet Take 2,000 Units by mouth daily          FISH OIL by Does not apply route daily.      acetaminophen (TYLENOL) 500 MG tablet Take 500 mg by mouth 2 times daily.      mirabegron (MYRBETRIQ) 25 MG 24 hr tablet Take 1 tablet (25 mg total) by mouth daily 30 tablet 12    CINNAMON PO Take by mouth      cyanocobalamin (VITAMIN B-12) 1000 MCG tablet Take 1,000 mcg by mouth daily      Co-Enzyme Q-10 100 MG CAPS Take by mouth      HYDROcodone-acetaminophen (VICODIN) 5-500 MG per tablet 1-2 po q 6 hours prn pain MDD=6 180 tablet 1     No current facility-administered medications for this visit.         Physical Exam:  Filed Vitals:    05/10/15 1301   BP: 134/72   Pulse: 70   Weight: 100.699 kg (222 lb)       Constitutional: Comfortable, no apparent distress, obese  Eyes:  Equal, round and reactive, conjunctivae clear, lids normal  ENT/mouth: Moist mucous membranes, no oropharyngeal lesions, good dentition, hearing intact  Resp: Unlabored breathing effort, lungs clear to auscultation bilaterally, no wheezes, no rhonchi  CV:  Regular S1/S2, no murmurs, no peripheral edema  Abd: Abdomen soft, not tender, not distended, bowel sounds present, no hepatosplenomegaly  MSK: Left lower back tenderness with hip flexion on left and right, chronic knee crackling (s/p left knee surgery), no inflammation of joints  Skin:  Warm, dry, good turgor, no rashes  Neuro: Speech intact, no motor deficits, no sensory deficits  Psych:  Alert and oriented x3, normal affect and mood      Assessment:  81 y.o.male today presenting for f/u of various issues      Plan:    1.   DM type II - with some changes to diet and exercise, has lost weight   - con't diet and exercise for now   - con't  amaryl 8m (glimiperide), januvia and metformin   - if not improved, will need to see endocrine service   - last HbA1c done 11/2014 = 9.4%    - f/u HbA1c   Diabetes metrics:    Last ophthalmology exam: just done      Last podiatry exam: done today by myself    Lesions on feet: none    Checks feet daily/nightly: yes     - counseling on daily foot checks done today    Monofilament test of feet:   feels _0 .464mbilaterally    Tobacco use: never      2.  Protein S deficiency -    - on coumadin chronically   - f/u INR monthly/PRN    3.  Pain/pressure in bilateral calves and ankles - as well as lower back, in the setting of Paget's disease   - will check ABI's   - holding off on vascular surgery input for now   - will give info for acupuncture - may need further investigation of his lower back   - con't infrequent (weekly) vicodin gabapentin   - not sure if he's suitable for orthopedics input at this time    4.  BP's elevated _1  - today WNL.    5.  Dementia -    - con't aricept/namenda    - will need to f/u with memory clinic    6.  Chronically mildly elevated TSH   - following clinically      Follow up: 2-3 months    LiJacklyn ShellMD  Internal Medicine

## 2015-05-11 ENCOUNTER — Telehealth: Payer: Self-pay | Admitting: Primary Care

## 2015-05-11 DIAGNOSIS — E1165 Type 2 diabetes mellitus with hyperglycemia: Secondary | ICD-10-CM

## 2015-05-11 LAB — HEMOGLOBIN A1C: Hemoglobin A1C: 8.8 % — ABNORMAL HIGH (ref 4.0–6.0)

## 2015-05-11 LAB — MICROALBUMIN, URINE, RANDOM
Creatinine,UR: 156 mg/dL (ref 20–300)
Microalbumin,UR: <0.3 mg/dL

## 2015-05-11 NOTE — Telephone Encounter (Signed)
-----   Message from Edythe Clarity, MD sent at 05/11/2015  1:52 PM EDT -----  His HbA1c is a bit better, but still significantly above 8.0 (is 8.8).  Would advise that we consult the endocrine service for further medication input, alongside his dietary/exercise changes (which have helped!).  Please place endocrine consult.

## 2015-05-11 NOTE — Telephone Encounter (Signed)
Spoke with [name] (patient's wife) and relayed appointment information to her. Also informed [name] that a new script for diabetic testing supply's was sent to [facility] pharmacy in order to be covered by insurance.

## 2015-05-11 NOTE — Telephone Encounter (Signed)
Spoke with Jon [name] and did go over lab results and that a referral would be placed for endocrine for him and he agreed to the referral.

## 2015-05-12 ENCOUNTER — Telehealth: Payer: Self-pay

## 2015-05-12 NOTE — Telephone Encounter (Signed)
Ref by Edythe Clarity, MD Diagnosis: Poorly controlled diabetes mellitus    Review Chart View

## 2015-05-12 NOTE — Telephone Encounter (Signed)
NA

## 2015-05-15 LAB — VITAMIN D
25-OH VIT D2: <1 ng/mL
25-OH VIT D3: 35 ng/mL
25-OH Vit Total: 35 ng/mL (ref 30.0–80.0)

## 2015-05-25 ENCOUNTER — Telehealth: Payer: Self-pay | Admitting: Primary Care

## 2015-05-25 NOTE — Telephone Encounter (Signed)
Left message on personal voice mail to have INR done today or tomorrow am.

## 2015-05-29 ENCOUNTER — Encounter: Payer: Self-pay | Admitting: Gastroenterology

## 2015-05-30 ENCOUNTER — Ambulatory Visit
Admit: 2015-05-30 | Discharge: 2015-05-30 | Disposition: A | Discharge status: Home / Self Care | Payer: Self-pay | Source: Ambulatory Visit | Attending: Primary Care | Admitting: Primary Care

## 2015-05-30 DIAGNOSIS — D6859 Other primary thrombophilia: Secondary | ICD-10-CM

## 2015-05-30 LAB — PROTIME-INR
INR: 2 — ABNORMAL HIGH (ref 1.0–1.2)
Protime: 22.8 s — ABNORMAL HIGH (ref 9.2–12.3)

## 2015-05-30 NOTE — Telephone Encounter (Signed)
INR was drawn.

## 2015-05-30 NOTE — Telephone Encounter (Signed)
I called and spoke with Jon [name], reminding him to go for his INR, he states he is planning to go today.

## 2015-05-31 ENCOUNTER — Ambulatory Visit: Payer: Self-pay

## 2015-05-31 NOTE — Telephone Encounter (Signed)
Attempt 1 of 2: no answer

## 2015-05-31 NOTE — Patient Instructions (Signed)
I called and reviewed INR and dosing with wife [name], she states she understands and will relay to Roff.

## 2015-06-01 ENCOUNTER — Ambulatory Visit
Admit: 2015-06-01 | Discharge: 2015-06-01 | Disposition: A | Discharge status: Home / Self Care | Payer: Self-pay | Source: Ambulatory Visit | Attending: Primary Care | Admitting: Primary Care

## 2015-06-01 NOTE — Telephone Encounter (Signed)
Attempt 2 of 2- no answer, left message. Will notify referring office and mail letter to patients home.

## 2015-06-02 ENCOUNTER — Telehealth: Payer: Self-pay

## 2015-06-02 ENCOUNTER — Telehealth: Payer: Self-pay | Admitting: Primary Care

## 2015-06-02 NOTE — Telephone Encounter (Signed)
-----   Message from Edythe Clarity, MD sent at 06/01/2015 11:26 PM EDT -----  Please let him know that his ABI's were normal - no vascular reason for his pain at this time.  Can consider orthopedics if would like.  Let me know.

## 2015-06-02 NOTE — Telephone Encounter (Signed)
Left message on Jon [name]'s voice mail with results of ABI's , they were normal and no vascular reason for his pain and if he would like to consider orthopedics for his pain to call the office.

## 2015-06-02 NOTE — Telephone Encounter (Signed)
Please review 05/26 encounter  [name], patient's spouse, called requesting to schedule a NPV   Caller would like to schedule as soon as possible     Please call [name] at [phone] to advise

## 2015-06-08 ENCOUNTER — Other Ambulatory Visit: Payer: Self-pay | Admitting: Primary Care

## 2015-06-27 NOTE — Telephone Encounter (Signed)
Jon [name], from PCP office, called with the insurance reference number from [name]   This number is [id number] - called unclear why they called her with the insurance number     Any questions please call [phone]

## 2015-06-28 ENCOUNTER — Ambulatory Visit: Payer: Self-pay

## 2015-06-28 ENCOUNTER — Ambulatory Visit
Admit: 2015-06-28 | Discharge: 2015-06-28 | Disposition: A | Discharge status: Home / Self Care | Payer: Self-pay | Source: Ambulatory Visit | Attending: Primary Care | Admitting: Primary Care

## 2015-06-28 ENCOUNTER — Telehealth: Payer: Self-pay | Admitting: Primary Care

## 2015-06-28 DIAGNOSIS — D6859 Other primary thrombophilia: Secondary | ICD-10-CM

## 2015-06-28 LAB — PROTIME-INR
INR: 3.8 — ABNORMAL HIGH (ref 1.0–1.2)
Protime: 44 s — ABNORMAL HIGH (ref 9.2–12.3)

## 2015-06-28 NOTE — Patient Instructions (Signed)
I called and reviewed INR and dosing with wife [name], she states there has been no medication changes or dietary changes.  She will decrease dosing per instructions and repeat INR in 1 week.

## 2015-06-28 NOTE — Telephone Encounter (Signed)
INR done this am

## 2015-06-28 NOTE — Telephone Encounter (Signed)
Called [name] and left message on voice mail to have INR done today or tomorrow am.

## 2015-07-04 ENCOUNTER — Ambulatory Visit: Payer: Self-pay | Admitting: Primary Care

## 2015-07-04 ENCOUNTER — Encounter: Payer: Self-pay | Admitting: Primary Care

## 2015-07-04 VITALS — BP 152/80 | HR 68 | Resp 12 | Wt 219.7 lb

## 2015-07-04 DIAGNOSIS — M79669 Pain in unspecified lower leg: Secondary | ICD-10-CM

## 2015-07-04 DIAGNOSIS — F0390 Unspecified dementia without behavioral disturbance: Secondary | ICD-10-CM

## 2015-07-04 DIAGNOSIS — E1165 Type 2 diabetes mellitus with hyperglycemia: Secondary | ICD-10-CM

## 2015-07-04 DIAGNOSIS — D6859 Other primary thrombophilia: Secondary | ICD-10-CM

## 2015-07-04 DIAGNOSIS — IMO0001 Reserved for inherently not codable concepts without codable children: Secondary | ICD-10-CM

## 2015-07-04 NOTE — Progress Notes (Signed)
CC: "doing so much better on acupuncture!"    HPI:  Jon [name] is a 81 y.o.male here for f/u of various issues    Here with wife today.    Per patient:    1.  Chronic pain - Pain/pressure in bilateral calves and ankles - pain is "90% gone!" with acupuncture - taking his granddaughter (who has nausea).  Is able to swing a golf club better.  Infrequent use of vicodin, also on 2 pills of gabapentin qHS.  Recent ABI 05/2015 was normal.     2.  DM - last HbA1c=8.8%, however BG's at home are are in 200-220's after eating, however not as high prior.  Seeing endocrine next month.  Also thinks he's lost weight    3.  Protein S - on chronic coumadin.   Last INR=3.8 - somewhat elevated.      4.  Elevated BP's - was rushing in today      Review of systems:   No fever or chills  No headache  No sore throat  No chest pain  No shortness of breath  No cough  No change of appetite  No nausea  No vomiting  No abdominal pain  No stool changes  No urinary symptoms  No dizziness or lightheadedness  No depression  No skin rashes      Past Medical History   Diagnosis Date    Arthritis     BPH (benign prostatic hypertrophy)     Dementia      mild    DM type 2 (diabetes mellitus, type 2)     Paget's disease of bone     Protein S deficiency      On chronic anticoagulation    Sinus bradycardia     Varicella      Past Surgical History   Procedure Laterality Date    Skin graft      Joint replacement      Cholecystectomy      Knee replacement      Tonsillectomy         Allergies:   Allergies   Allergen Reactions    Bee Venom Other (See Comments)     Red and swelling    No Known Latex Allergy      Created by Conversion - 0;     Insects [Other] Other (See Comments)     Red and swelling     Family History   Problem Relation Age of Onset    BRCA 1/2 Brother      Social History     Social History    Marital status: Married     Spouse name: N/A    Number of children: N/A    Years of education: N/A     Occupational History     Former Personal assistant, used to work night shift      Social History Main Topics    Smoking status: Never Smoker    Smokeless tobacco: Never Used    Alcohol use No    Drug use: No    Sexual activity: Not Asked     Other Topics Concern    None     Social History Narrative    2 daughters with neurological issues       Current Outpatient Prescriptions   Medication Sig Dispense Refill    COUMADIN 4 MG tablet TAKE 1 TABLET BY MOUTH EVERY DAY 30 tablet 5    donepezil (ARICEPT) 10 MG tablet Take 1  tablet (10 mg total) by mouth daily 90 tablet 3    blood glucose monitor (FREESTYLE LITE) kit Use as instructed 1 each 0    FREESTYLE LITE test strip To be used twice a day 100 each 4    meloxicam (MOBIC) 7.5 MG tablet TAKE 1 TABLET BY MOUTH EVERY DAY 30 tablet 3    gabapentin (GABAPENTIN) 300 MG capsule TAKE ONE CAPSULE BY MOUTH TWICE A DAY 180 capsule 0    glimepiride (AMARYL) 4 MG tablet Take 1 tablet (4 mg total) by mouth every morning 90 tablet 3    sitaGLIPtin (JANUVIA) 100 MG tablet Take 1 tablet (100 mg total) by mouth daily   Swallow whole. Do not crush, break, or chew. 90 tablet 3    lancets (ONETOUCH ULTRASOFT) Use   2  times per day as instructed for blood glucose testing. 100 each 3    memantine (NAMENDA) 10 MG tablet Take 1 Tablet by mouth Twice a Day 180 tablet 3    metFORMIN (GLUCOPHAGE) 1000 MG tablet Take 1 tablet (1,000 mg total) by mouth 2 times daily (with meals) 180 tablet 4    pravastatin (PRAVACHOL) 40 MG tablet Take 1 tablet (40 mg total) by mouth daily 90 tablet 5    warfarin (COUMADIN) 4 MG tablet Take 1 tablet (4 mg total) by mouth daily 30 tablet 5    finasteride (PROSCAR) 5 MG tablet TAKE ONE TABLET BY MOUTH ONCE DAILY FOR USE BY MEN ONLY 90 tablet 3    mirabegron (MYRBETRIQ) 25 MG 24 hr tablet Take 1 tablet (25 mg total) by mouth daily 30 tablet 12    NAMENDA 10 MG tablet TAKE 1 TABLET BY MOUTH TWO TIMES DAILY 180 tablet 3    COUMADIN 2.5 MG tablet TAKE 2 TABLETS BY MOUTH ON MONDAY  AND THURSDAY. TAKE 1 AND 1/2 TABLETS BY MOUTH THE REST OF DAYS OR AS DIRECTED 138 tablet 4    escitalopram (LEXAPRO) 5 MG tablet Take 1 tablet (5 mg total) by mouth daily 30 tablet 5    fluocinonide-emollient (LIDEX-E) 0.05 % cream Apply topically 2 times daily 30 g 2    Blood Glucose Calibration (OT ULTRA/FASTTK CNTRL SOLN) SOLN As directed 1 each 5    CINNAMON PO Take by mouth      cyanocobalamin (VITAMIN B-12) 1000 MCG tablet Take 1,000 mcg by mouth daily      Co-Enzyme Q-10 100 MG CAPS Take by mouth      omega-3 acid ethyl esters (LOVAZA) 1 GM capsule Take 2 g by mouth 2 times daily   Swallow whole. Do not crush or chew.      Multiple Vitamins-Minerals (MULTI VITAMIN/MINERALS PO) Take by mouth      HYDROcodone-acetaminophen (VICODIN) 5-500 MG per tablet 1-2 po q 6 hours prn pain MDD=6 180 tablet 1    cholecalciferol (VITAMIN D) 1000 UNIT tablet Take 2,000 Units by mouth daily          FISH OIL by Does not apply route daily.      acetaminophen (TYLENOL) 500 MG tablet Take 500 mg by mouth 2 times daily.       No current facility-administered medications for this visit.          Physical Exam:  Vitals:    07/04/15 0845   BP: 152/80   Pulse: 68   Resp: 12   Weight: 99.7 kg (219 lb 11.2 oz)       Constitutional: Comfortable, no apparent distress,  obese  Eyes:  Equal, round and reactive, conjunctivae clear, lids normal  ENT/mouth: Moist mucous membranes, no oropharyngeal lesions, good dentition, hearing intact  Resp: Unlabored breathing effort, lungs clear to auscultation bilaterally, no wheezes, no rhonchi  CV: Regular S1/S2, no murmurs, no peripheral edema  Abd: Abdomen soft, not tender, not distended, bowel sounds present, no hepatosplenomegaly  MSK: No joint tenderness, no inflammation of joints  Skin:  Warm, dry, good turgor, no rashes  Neuro: Speech intact, no motor deficits, no sensory deficits  Psych:  Alert and oriented x3, normal affect and mood      Assessment:  81 y.o.male today presenting for  f/u of various issues      Plan:    1.  DM type II - with improvement in exercise due to pain control   - con't diet and exercise   - con't amaryl 109m (glimiperide), januvia and metformin   - f/u with endocrine service   - last HbA1c done 05/10/2015 = 8.8%    - f/u HbA1c   Diabetes metrics:    Last ophthalmology exam: done Spring 2016   Last podiatry exam: done 05/10/2015 by myself   Lesions on feet: none   Checks feet daily/nightly: yes    - counseling on daily foot checks done today    Monofilament test of feet: feels '@0' .431mbilaterally    Tobacco use: never      2.  Chronic LE pain - with improvement w/acupuncture   - con't acupuncture   - con't infrequent vicodin and gabapentin    3.  Protein S deficiency -    - on coumadin chronically   - f/u INR monthly/PRN    4.  Elevated BP's - was rushing in today    5.  Dementia -    - con't aricept/namenda    - will need to f/u with memory clinic    Follow up: 4 months    LiJacklyn ShellMD  Internal Medicine

## 2015-07-04 NOTE — Patient Instructions (Signed)
1.  Please have your HbA1c prior to seeing endocrinology

## 2015-07-04 NOTE — Telephone Encounter (Signed)
Referral in the system.

## 2015-07-06 ENCOUNTER — Ambulatory Visit
Admit: 2015-07-06 | Discharge: 2015-07-06 | Disposition: A | Discharge status: Home / Self Care | Payer: Self-pay | Source: Ambulatory Visit | Attending: Primary Care | Admitting: Primary Care

## 2015-07-06 ENCOUNTER — Ambulatory Visit: Payer: Self-pay

## 2015-07-06 ENCOUNTER — Telehealth: Payer: Self-pay | Admitting: Primary Care

## 2015-07-06 DIAGNOSIS — I82409 Acute embolism and thrombosis of unspecified deep veins of unspecified lower extremity: Secondary | ICD-10-CM

## 2015-07-06 DIAGNOSIS — D6859 Other primary thrombophilia: Secondary | ICD-10-CM

## 2015-07-06 LAB — COMPREHENSIVE METABOLIC PANEL
ALT: 29 U/L (ref 0–50)
AST: 26 U/L (ref 0–50)
Albumin: 4.1 g/dL (ref 3.5–5.2)
Alk Phos: 77 U/L (ref 40–130)
Anion Gap: 13 (ref 7–16)
Bilirubin,Total: 0.4 mg/dL (ref 0.0–1.2)
CO2: 23 mmol/L (ref 20–28)
Calcium: 9.3 mg/dL (ref 8.6–10.2)
Chloride: 104 mmol/L (ref 96–108)
Creatinine: 0.88 mg/dL (ref 0.67–1.17)
GFR,Black: 94 *
GFR,Caucasian: 82 *
Glucose: 241 mg/dL — ABNORMAL HIGH (ref 60–99)
Lab: 18 mg/dL (ref 6–20)
Potassium: 4.8 mmol/L (ref 3.3–5.1)
Sodium: 140 mmol/L (ref 133–145)
Total Protein: 7 g/dL (ref 6.3–7.7)

## 2015-07-06 LAB — PROTIME-INR
INR: 2.2 — ABNORMAL HIGH (ref 1.0–1.2)
Protime: 25.7 s — ABNORMAL HIGH (ref 9.2–12.3)

## 2015-07-06 LAB — HEMOGLOBIN A1C: Hemoglobin A1C: 8.7 % — ABNORMAL HIGH (ref 4.0–6.0)

## 2015-07-06 NOTE — Patient Instructions (Signed)
Spoke with [name] and reviewed INR with him and to continue  5 mg Mon only and 4 mg other 6 days of the week and recheck again in three weeks.

## 2015-07-06 NOTE — Telephone Encounter (Signed)
Called [name], he had it done this am and needs a new standing order.  Standing order placed.  He states that they took the blood anyway and was going to call office for new standing order.

## 2015-07-06 NOTE — Telephone Encounter (Signed)
INR done and addressed.

## 2015-07-11 ENCOUNTER — Ambulatory Visit: Payer: Self-pay | Admitting: Endocrinology

## 2015-07-15 ENCOUNTER — Ambulatory Visit: Payer: Self-pay | Admitting: Endocrinology

## 2015-07-15 ENCOUNTER — Other Ambulatory Visit: Payer: Self-pay | Admitting: Primary Care

## 2015-07-29 ENCOUNTER — Telehealth: Payer: Self-pay | Admitting: Primary Care

## 2015-07-29 NOTE — Telephone Encounter (Signed)
Left message on [name]'s personal voice mail to have INR done.

## 2015-07-31 ENCOUNTER — Other Ambulatory Visit: Payer: Self-pay | Admitting: Primary Care

## 2015-08-01 ENCOUNTER — Ambulatory Visit: Payer: Self-pay

## 2015-08-01 ENCOUNTER — Ambulatory Visit
Admission: RE | Admit: 2015-08-01 | Discharge: 2015-08-01 | Disposition: A | Discharge status: Home / Self Care | Payer: Self-pay | Source: Ambulatory Visit | Attending: Primary Care | Admitting: Primary Care

## 2015-08-01 DIAGNOSIS — D6859 Other primary thrombophilia: Secondary | ICD-10-CM

## 2015-08-01 LAB — PROTIME-INR
INR: 2.1 — ABNORMAL HIGH (ref 1.0–1.2)
Protime: 23.7 s — ABNORMAL HIGH (ref 9.2–12.3)

## 2015-08-01 NOTE — Telephone Encounter (Signed)
INR done and addressed.

## 2015-08-01 NOTE — Patient Instructions (Signed)
Spoke with [name] and reviewed INR with him and to continue  5 mg Mon only and 4 mg other 6 days of the week and recheck again in 4 weeks.

## 2015-08-12 ENCOUNTER — Other Ambulatory Visit: Payer: Self-pay | Admitting: Primary Care

## 2015-08-26 ENCOUNTER — Other Ambulatory Visit: Payer: Self-pay | Admitting: Primary Care

## 2015-09-02 ENCOUNTER — Telehealth: Payer: Self-pay | Admitting: Primary Care

## 2015-09-02 NOTE — Telephone Encounter (Signed)
Spoke with [name] and she will make sure that Jon [name] gets his INR done.

## 2015-09-05 ENCOUNTER — Ambulatory Visit: Payer: Self-pay | Admitting: Endocrinology

## 2015-09-05 ENCOUNTER — Ambulatory Visit
Admission: RE | Admit: 2015-09-05 | Discharge: 2015-09-05 | Disposition: A | Discharge status: Home / Self Care | Payer: Self-pay | Source: Ambulatory Visit | Attending: Primary Care | Admitting: Primary Care

## 2015-09-05 ENCOUNTER — Ambulatory Visit: Payer: Self-pay

## 2015-09-05 VITALS — BP 173/75 | HR 72 | Ht 70.25 in | Wt 217.0 lb

## 2015-09-05 DIAGNOSIS — IMO0002 Reserved for concepts with insufficient information to code with codable children: Secondary | ICD-10-CM

## 2015-09-05 DIAGNOSIS — I82409 Acute embolism and thrombosis of unspecified deep veins of unspecified lower extremity: Secondary | ICD-10-CM

## 2015-09-05 DIAGNOSIS — D6859 Other primary thrombophilia: Secondary | ICD-10-CM

## 2015-09-05 LAB — PROTIME-INR
INR: 2.4 — ABNORMAL HIGH (ref 1.0–1.2)
Protime: 28.1 s — ABNORMAL HIGH (ref 9.2–12.3)

## 2015-09-05 LAB — POCT HEMOGLOBIN A1C: Hemoglobin A1C,POC: 8.7 % — ABNORMAL HIGH (ref 4.0–6.0)

## 2015-09-05 MED ORDER — LIRAGLUTIDE 18 MG/3ML SC SOPN *I*
1.8000 mg | PEN_INJECTOR | Freq: Every day | SUBCUTANEOUS | 5 refills | Status: DC
Start: 2015-09-05 — End: 2015-11-16

## 2015-09-05 MED ORDER — INSULIN PEN NEEDLE 32G X 4 MM MISC *A*
3 refills | Status: DC
Start: 2015-09-05 — End: 2016-05-11

## 2015-09-05 NOTE — Patient Instructions (Addendum)
Please continue metformin 1000 mg twice a day  Please continue glimepiride at 4 mg in the morning with breafkast    Start Victoza:  0.6 mg once a day in the morning  After 2 weeks, if you feel ok (no nausea/vomiting, and no abdominal pain), then increase to 1.2 mg once a day  After another 2 weeks, if you feel ok (no nausea/vomiting, and no abdominal pain), then increase to 1.8 mg once a day    When you start Victoza, please stop Januvia.    Please keep checking fingersticks 2-3 times a day like you're doing: before meals or bedtime

## 2015-09-05 NOTE — Telephone Encounter (Signed)
INR done and addressed.

## 2015-09-05 NOTE — Patient Instructions (Addendum)
Left message on personal voice mail with INR results and to continue  5 mg Mon only and 4 mg other 6 days of the week and recheck again on 10/19.

## 2015-09-05 NOTE — Progress Notes (Signed)
Endocrine Consult Note  Jon [name] is a 81 y.o.  man seen on 09/05/2015 for consultation regarding DM2.    History of Present Illness:  Dx DM in 2001, notices effect of food on BGs. Highs with pizza, [place] food.     In past, went to DM eduction at [facility] and advised to eat 5 times a day, which he can't do.    --Treatment history:  --Current management:  Current diabetic medications include: glimepiride 4 mg daily, Januvia 100 gm daily, metformin 1000 mg BID  --Self-glucose monitoring:   AM < 200  preL 245  Can be in 140-160s on more active days.  --Hyperglycemia:  See above   --Hypoglycemia: none < 80. Loss of consciousness: no.  Assistance needed: no. Time of day:   --Endocrine:   Polydipsia: no. Polyuria: yes  --CV: Chest pain: no. Shortness of breath: no  --Extremities:  -- Hands: Tingling: no. Numbness: no. Pain: no. If yes, degree of discomfort rated (1=least, 10=worst):   -- Feet: Tingling: no. Numbness: yes. Pain: yes. If yes, degree of discomfort rated (1=least, 10=worst):   -- Eyes: Double/blurry vision: no. Retinopathy: no. Cataracts: no. Glaucoma: no. Last eye exam: 05/2015.  -- Diet: has decr carbs    --Exercise:  Gym, golf twice a week.     Patient's medications, allergies, past medical, surgical, social and family histories were reviewed and updated as appropriate.     Review of Systems:  Constitutional: fever: no, chills: no, excessive fatigue: yes, unintentional weight gain: no, night sweats: no, and unintentional weight loss: no.  Eyes: blurry vision: no, redness: no, double vision: no, dryness: yes, irritation: no, and loss of vision: no.  Ear/nose/throat: dry mouth: no, runny nose: no, ringing in ears: no, change in voice: no, sore throat: no, mouth sore: no, heavy snoring: no, hearing loss: no, and sinus congestion: no.  Heart/Chest: discomfort: no, pressure: no, rapid heart rate: no, calf pain with walking: yes, fainting: no, and leg swelling: no.  Lungs/breathing: cough: no, wheezing:  no, difficulty breathing: no, and sputum: no.  Stomach/bowel: diarrhea: no, constipation: no, nausea/vomiting: no, belly pain: no, heart burn: no, change in appetite: no, difficulty swallowing: no, and blood in stool/black stool: no.  Kidneys: difficulty with urination: no, very thirsty: no, pain with urination: no, frequent urination: yes, blood in urine: no, urination at night: yes, incomplete emptying: no, and leakage of urine: yes  Breast: pain: no and discharge: no.  Male/male organs: irregular period: no, decreased libido: no, and decreased sexual function: no.  Muscles: decreased strength: no and cramping/pain: yes  Skin: rash: no, ulcer: no, stretch marks: no, callous: no, and new or changing moles: no.  Neurological: memory loss: yes, tremor: no, muscle weakness: no, involuntary movement: no, falls: no, dizziness: no, numbness/tingling: yes, and frequent/severe headaches: no.  Psychosocial: insomnia: yes, feeling sad or depressed: no, panic: no, and anxiety/nervousness: no.  Endocrine: hot flashes: no, excessive thirst: no, and heat/cold intolerance: no.  Blood/lymphatic: easy bleeding: no, anemia: no, swollen lymph nodes: no, and excessive bruising: no.  Allergy: severe allergic reactions: no, hives: no, and frequent infections: no.    Physical Exam:  Blood pressure 173/75, pulse 72, height 1.784 m (5' 10.25"), weight 98.4 kg (217 lb).  GENERAL APPEARANCE:  NAD, pleasant  HEENT:  EOMI,  no exophthalmos, sclerae clear.  NECK: supple, no thyromegaly or lymphadenopathy. No discretely palpable thyroid nodules.  CHEST:  lungs clear, no rales, rhonchi or wheezes  HEART:  RRR, normal s1,  s2  BACK:  no CVA tenderness  ABDOMEN: soft, NT, ND, BS positive  EXTREMITIES: no edema   NEUROLOGICAL: alert, oriented, no tremors with extended arms.    SKIN: normal thickness, no striae, rashes      Labs and Studies:  05/10/15 A1c 8.8, TSH & free T4: 5.04&0.9  07/06/15 A1c 8.7, Cr 0.88  09/05/15 POC A1c 8.7    Assessment and  Plan:  1. DM:  Uncontrolled. Goal A1c 7-8 given co-morbidities  - Discussed difference between fasting and postprandial hyperglycemia, and how various meds act on these 2 phases of glucose ctrl.  - Discussed utility of fingersticks.  - Will start Victoza in place of Januvia to help with postprandial BG ctrl. Discussed that since we are replacing meds, the impact on BGs will be less, but since Victoza is generally more potent than Januvia for BG and A1c lowering, there should still be improvement.  - Discussed risks of Victoza, esp N/V and pancreatitis, as well as warning symptoms of pancreatitis.  - Discussed Invokana and TZDs, and Lantus, as well as their benefits/risks.  - Gave pen teaching today.  - Discussed that in the past, it was recommended to eat 5 times a day due to older insulins that are used less now. However, that is no longer a standard recommendation and so it is OK if he focuses on eating breakfast, lunch, and dinner.    Recommendations typed for Mr. Klosowski in large, bold print:  Please continue metformin 1000 mg twice a day  Please continue glimepiride at 4 mg in the morning with breakfast    Start Victoza:  0.6 mg once a day in the morning  After 2 weeks, if you feel ok (no nausea/vomiting, and no abdominal pain), then increase to 1.2 mg once a day  After another 2 weeks, if you feel ok (no nausea/vomiting, and no abdominal pain), then increase to 1.8 mg once a day    When you start Victoza, please stop Januvia.    Please keep checking fingersticks 2-3 times a day like you're doing: before meals or bedtime    2. Hyperlipidemia: didn't discuss today given time spent on BG management.    3. HTN: not at goal < 140/80 but has been at recent other appts. Cont to monitor.    4. Diabetic neuropathy: emph BG ctrl.    5. Diabetic retinopathy:  Saw in 05/2015    6. Diabetic nephropathy:  Cr normal and UMA<0.3 in 04/2015. Emph BG ctrl.    7. CAD:  On Coumadin for h/o DVT.    I let him know that I will be  moving out of state this fall and so we will transfer his care to one of my colleagues. Thus, we have arranged a follow up with Burt Knack, one of the Nurse Practitioners I work with, on 11/17/15. I wished Mr. Lackman the very best.    Labs before next appt:  None, will do POC A1c

## 2015-09-14 ENCOUNTER — Ambulatory Visit: Payer: Self-pay | Admitting: Urology

## 2015-09-16 ENCOUNTER — Ambulatory Visit: Payer: Self-pay | Admitting: Urology

## 2015-09-19 ENCOUNTER — Encounter: Payer: Self-pay | Admitting: Urology

## 2015-09-19 ENCOUNTER — Ambulatory Visit: Payer: Self-pay | Admitting: Urology

## 2015-09-19 VITALS — BP 110/70 | HR 76 | Ht 70.0 in | Wt 217.0 lb

## 2015-09-19 DIAGNOSIS — R21 Rash and other nonspecific skin eruption: Secondary | ICD-10-CM

## 2015-09-19 DIAGNOSIS — R399 Unspecified symptoms and signs involving the genitourinary system: Secondary | ICD-10-CM

## 2015-09-19 DIAGNOSIS — N400 Enlarged prostate without lower urinary tract symptoms: Secondary | ICD-10-CM

## 2015-09-19 LAB — POCT URINALYSIS DIPSTICK
Blood,UA POCT: NEGATIVE
Glucose,UA POCT: 500 — AB
Ketones,UA POCT: NEGATIVE
Leuk Esterase,UA POCT: NEGATIVE
Lot #: 15230502
Nitrite,UA POCT: NEGATIVE
PH,UA POCT: 5 (ref 5–8)
Protein,UA POCT: NEGATIVE mg/dL
Specific gravity,UA POCT: 1.03 (ref 1.002–1.03)

## 2015-09-19 LAB — POCT BLADDER SCAN PVR

## 2015-09-19 MED ORDER — FLUOCINONIDE-E 0.05 % EX CREA
TOPICAL_CREAM | Freq: Two times a day (BID) | CUTANEOUS | 2 refills | Status: AC
Start: 2015-09-19 — End: 2015-10-19

## 2015-09-19 NOTE — Progress Notes (Signed)
[facility] Follow Up Visit    KITO CUFFE  09/19/2015    Chief complaint : bph f/u  Pt is accompanied by his wife  HPI: occasional urinary frequency and urgency previously well controlled on combination regimen but now he's having occasional urgency with urge incontinence.    I reviewed the strengths and weaknesses of PSA as a screening test for prostate cancer. We reviewed the recent controversies surrounding PSA screening and the recent recommendation by the USPSTF that men in this country no longer be screened with PSA. We discussed the fact the the AUA has a different feeling about PSA screening and takes issue with the USPSTF findings and recommendations. The patient considered these issues and has decided that he no longer wishes to have further PSA levels drawn.   He also complains of a recurring rash on the head of his uncircumcised penis which has been intermittent over the past 20 months or so. He retract the foreskin to wash 2 times per week. He reports that it clears for a time and then returns. He saw dermatology last year.    Changes since prior visit :   PMH - no change  PSH - no change  Medications - no change  Allergies - no change  ROS - no change    Physical Exam :   Visit Vitals    BP 110/70    Pulse 76    Ht 1.778 m (5\' 10" )    Wt 98.4 kg (217 lb)    BMI 31.14 kg/m2     General appearance - well appearing, nad  Back - no cvat bilaterally  Abdomen - soft, no masses, no hepatosplenomegaly, non-distended bladder, no inguinal hernia, no tenderness  Lymph nodes - no lad  Extremities/pulses -nl  Genitourinary - Male -  Penis -  normal, uncircumcized and mild papular erythema at corona of penis and head of the glans   Urethral Meatus -  Normal  Scrotum -  Normal  Right and left testes - Normal  Right and left epididymis -  Normal  Anus and Perineum -  normal and No masses    Studies reviewed -   Component      Latest Ref Rng 09/19/2015   PH,UA POCT      5 - 8 5.0   Leuk Esterase,UA  POCT      Negative Negative   Nitrite,UA POCT      Negative Negative   Protein,UA POCT      Negative mg/dL Negative   Glucose,UA POCT      Normal 500 mg/dL (A)   Ketones,UA POCT      Negative Negative   Blood,UA POCT      Negative Negative         Diagnosis established -BPH with LUTS,  pt's memory issues and h/o vascular dementia currently on every day myrbetriq; glucosuria  Plan of care -continue myrbetriq - but increase dose to bid and if improves urgency and UI then will change Rx

## 2015-09-26 ENCOUNTER — Other Ambulatory Visit: Payer: Self-pay | Admitting: Urology

## 2015-09-27 MED ORDER — MIRABEGRON ER 25 MG PO TB24 *I*
25.0000 mg | ORAL_TABLET | Freq: Every day | ORAL | 12 refills | Status: DC
Start: 2015-09-27 — End: 2015-10-06

## 2015-10-06 ENCOUNTER — Telehealth: Payer: Self-pay | Admitting: Primary Care

## 2015-10-06 ENCOUNTER — Other Ambulatory Visit: Payer: Self-pay | Admitting: Urology

## 2015-10-06 ENCOUNTER — Telehealth: Payer: Self-pay | Admitting: Urology

## 2015-10-06 MED ORDER — MIRABEGRON ER 50 MG PO TB24 *I*
50.0000 mg | ORAL_TABLET | Freq: Every day | ORAL | 5 refills | Status: DC
Start: 2015-10-06 — End: 2015-11-14

## 2015-10-06 NOTE — Telephone Encounter (Signed)
Patient's wife [name] states that she was advised to increase mirabegron (MYRBETRIQ) 25 MG 24 hr tablet to twice a day. She states that it working much better and wanted to see if an updated prescription can be sent to [place]

## 2015-10-06 NOTE — Telephone Encounter (Signed)
Per Dr. Alphia Moh last note:    Diagnosis established -BPH with LUTS, pt's memory issues and h/o vascular dementia currently on every day myrbetriq; glucosuria  Plan of care -continue myrbetriq - but increase dose to bid and if improves urgency and UI then will change Rx

## 2015-10-06 NOTE — Telephone Encounter (Signed)
Myrbetriq 50 mgm was sent into the pharmacy.

## 2015-10-06 NOTE — Telephone Encounter (Signed)
Spoke with [name] and she will let [name] know he needs to have his INR done,

## 2015-10-07 ENCOUNTER — Ambulatory Visit: Payer: Self-pay

## 2015-10-07 ENCOUNTER — Ambulatory Visit
Admission: RE | Admit: 2015-10-07 | Discharge: 2015-10-07 | Disposition: A | Discharge status: Home / Self Care | Payer: Self-pay | Source: Ambulatory Visit | Attending: Primary Care | Admitting: Primary Care

## 2015-10-07 DIAGNOSIS — I82409 Acute embolism and thrombosis of unspecified deep veins of unspecified lower extremity: Secondary | ICD-10-CM

## 2015-10-07 DIAGNOSIS — D6859 Other primary thrombophilia: Secondary | ICD-10-CM

## 2015-10-07 DIAGNOSIS — E8809 Other disorders of plasma-protein metabolism, not elsewhere classified: Secondary | ICD-10-CM

## 2015-10-07 LAB — PROTIME-INR
INR: 2.4 — ABNORMAL HIGH (ref 1.0–1.2)
Protime: 27.3 s — ABNORMAL HIGH (ref 9.2–12.3)

## 2015-10-07 NOTE — Patient Instructions (Signed)
Left message on personal voice mail with results or INR and to continue  5 mg Mon only and 4 mg other 6 days of the week and recheck again on 11/21.

## 2015-10-07 NOTE — Telephone Encounter (Signed)
INR done and addressed, see flow sheet

## 2015-10-21 ENCOUNTER — Other Ambulatory Visit: Payer: Self-pay | Admitting: Primary Care

## 2015-10-31 LAB — HM DIABETES EYE EXAM

## 2015-11-02 ENCOUNTER — Ambulatory Visit
Admission: RE | Admit: 2015-11-02 | Discharge: 2015-11-02 | Disposition: A | Discharge status: Home / Self Care | Payer: Self-pay | Source: Ambulatory Visit | Attending: Primary Care | Admitting: Primary Care

## 2015-11-02 ENCOUNTER — Telehealth: Payer: Self-pay | Admitting: Primary Care

## 2015-11-02 DIAGNOSIS — D6859 Other primary thrombophilia: Secondary | ICD-10-CM

## 2015-11-02 DIAGNOSIS — I82409 Acute embolism and thrombosis of unspecified deep veins of unspecified lower extremity: Secondary | ICD-10-CM

## 2015-11-02 LAB — PROTIME-INR
INR: 3.3 — ABNORMAL HIGH (ref 1.0–1.2)
Protime: 38.3 s — ABNORMAL HIGH (ref 9.2–12.3)

## 2015-11-02 NOTE — Telephone Encounter (Signed)
Left message on home phone to call the office

## 2015-11-02 NOTE — Telephone Encounter (Signed)
-----   Message from Edythe Clarity, MD sent at 11/02/2015 12:26 PM EST -----  Please call patient and find out if patient taking coumadin regularly, or if any new NSAID use or dietary changes (less leafy green vegetables).  Confirm patient's current dose of 5 mg Mon only and 4 mg other 6 days of the week   - if all is normal and unchanged, then change dose to 4 mg daily  - Recheck INR in 2 weeks.

## 2015-11-03 ENCOUNTER — Ambulatory Visit: Payer: Self-pay | Admitting: Primary Care

## 2015-11-03 DIAGNOSIS — E8809 Other disorders of plasma-protein metabolism, not elsewhere classified: Secondary | ICD-10-CM

## 2015-11-03 NOTE — Telephone Encounter (Signed)
Patient returned call to office. I informed patient you would call him back. Thank you.

## 2015-11-03 NOTE — Telephone Encounter (Signed)
Left message on home phone to call the office.    Left message on cell phone to call the office

## 2015-11-03 NOTE — Patient Instructions (Signed)
Spoke with [name] and nothing new, will go to 4 mg daily and recheck again on 11/30.

## 2015-11-04 ENCOUNTER — Other Ambulatory Visit: Payer: Self-pay | Admitting: Primary Care

## 2015-11-04 ENCOUNTER — Telehealth: Payer: Self-pay

## 2015-11-04 ENCOUNTER — Encounter: Payer: Self-pay | Admitting: Primary Care

## 2015-11-04 NOTE — Telephone Encounter (Signed)
Called the patient to confirm 11/21 appointment, there was no answer so I left a voicemail with the date and time of the appointment.

## 2015-11-07 ENCOUNTER — Encounter: Payer: Self-pay | Admitting: Primary Care

## 2015-11-07 ENCOUNTER — Ambulatory Visit: Payer: Self-pay | Admitting: Primary Care

## 2015-11-07 VITALS — BP 114/70 | HR 86 | Wt 214.0 lb

## 2015-11-07 DIAGNOSIS — M79662 Pain in left lower leg: Secondary | ICD-10-CM

## 2015-11-07 DIAGNOSIS — E1165 Type 2 diabetes mellitus with hyperglycemia: Secondary | ICD-10-CM

## 2015-11-07 DIAGNOSIS — D6859 Other primary thrombophilia: Secondary | ICD-10-CM

## 2015-11-07 DIAGNOSIS — M79661 Pain in right lower leg: Secondary | ICD-10-CM

## 2015-11-07 DIAGNOSIS — F0390 Unspecified dementia without behavioral disturbance: Secondary | ICD-10-CM

## 2015-11-07 DIAGNOSIS — H6123 Impacted cerumen, bilateral: Secondary | ICD-10-CM

## 2015-11-07 LAB — PCMH FALL RISK PLAN

## 2015-11-07 LAB — PCMH FALL RISK ASSESSMENT

## 2015-11-07 NOTE — Patient Instructions (Signed)
1.  Can make an appointment for acupuncture:  Jon [name] - [phone] - [address] [place] [address]    2.  Can call podiatrist: Dr. Andreas Newport [phone], [address], [place] [address]

## 2015-11-07 NOTE — Progress Notes (Signed)
CC: "doing ok, other than some back pain"    HPI:  Jon [name] is a 81 y.o.male here for eval of various issues    Here with wife.    Per patient:    1.  Chronic pain - back pain is problematic, was snowblowing today.  No longer seeing acupuncture.  On Friday seems to have had a viral illness - achy all over.  Avoiding vicodin use.  Using 2 pills of gabapentin qHS.     2. DM - last HbA1c=8.7% on 09/05/2015, started on victoza - "boy, that sucker works!"  BG's much better, lost 5 lbs.  Still on glimiperide 75m daily, metformin 10069mBID, on pravastatin 4023maily.  Sees endocrine 11/17/2015.    3. Protein S - on chronic coumadin. Last INR=3.3 - somewhat elevated.     4. Elevated BP's - excellent today! 114/70.      Review of systems:   No fever or chills  No headache  No sore throat  No chest pain  No shortness of breath  No cough  No change of appetite  No nausea  No vomiting  No abdominal pain  No stool changes  No urinary symptoms  No dizziness or lightheadedness  No depression  No skin rashes      Past Medical History   Diagnosis Date    Arthritis     BPH (benign prostatic hypertrophy)     Dementia      mild    DM type 2 (diabetes mellitus, type 2)     Paget's disease of bone     Protein S deficiency      On chronic anticoagulation    Sinus bradycardia     Varicella      Past Surgical History   Procedure Laterality Date    Skin graft      Joint replacement      Cholecystectomy      Knee replacement      Tonsillectomy         Allergies:   Allergies   Allergen Reactions    Bee Venom Other (See Comments)     Red and swelling    No Known Latex Allergy      Created by Conversion - 0;     Insects [Other] Other (See Comments)     Red and swelling     Family History   Problem Relation Age of Onset    BRCA 1/2 Brother      Social History     Social History    Marital status: Married     Spouse name: N/A    Number of children: N/A    Years of education: N/A     Occupational History    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      Social History Main Topics    Smoking status: Never Smoker    Smokeless tobacco: Never Used    Alcohol use No    Drug use: No    Sexual activity: Not Asked     Other Topics Concern    None     Social History Narrative    2 daughters with neurological issues       Current Outpatient Prescriptions   Medication Sig Dispense Refill    glimepiride (AMARYL) 2 MG tablet TAKE 1 TABLET BY MOUTH EVERY DAY WITH BREAKFAST 90 tablet 1    mirabegron (MYRBETRIQ) 50 MG 24 hr tablet Take 1 tablet (50 mg total)  by mouth daily 30 tablet 5    Liraglutide (VICTOZA) 18 MG/3ML injection Inject 1.8 mg into the skin daily 9 mL 5    insulin pen needle (BD ULTRA-FINE PEN NEEDLE NANO) 32G X 4 MM Use 1 times a day as instructed with Victoza pens 100 each 3    meloxicam (MOBIC) 7.5 MG tablet TAKE 1 TABLET BY MOUTH EVERY DAY 30 tablet 3    GABAPENTIN 300 MG capsule TAKE 1 CAPSULE BY MOUTH TWO TIMES DAILY 180 capsule 3    COUMADIN 4 MG tablet TAKE 1 TABLET BY MOUTH EVERY DAY 30 tablet 5    donepezil (ARICEPT) 10 MG tablet Take 1 tablet (10 mg total) by mouth daily 90 tablet 3    blood glucose monitor (FREESTYLE LITE) kit Use as instructed 1 each 0    FREESTYLE LITE test strip To be used twice a day 100 each 4    lancets (ONETOUCH ULTRASOFT) Use   2  times per day as instructed for blood glucose testing. 100 each 3    memantine (NAMENDA) 10 MG tablet Take 1 Tablet by mouth Twice a Day 180 tablet 3    metFORMIN (GLUCOPHAGE) 1000 MG tablet Take 1 tablet (1,000 mg total) by mouth 2 times daily (with meals) 180 tablet 4    finasteride (PROSCAR) 5 MG tablet TAKE ONE TABLET BY MOUTH ONCE DAILY FOR USE BY MEN ONLY 90 tablet 3    COUMADIN 2.5 MG tablet TAKE 2 TABLETS BY MOUTH ON MONDAY AND THURSDAY. TAKE 1 AND 1/2 TABLETS BY MOUTH THE REST OF DAYS OR AS DIRECTED 138 tablet 4    Blood Glucose Calibration (OT ULTRA/FASTTK CNTRL SOLN) SOLN As directed 1 each 5    CINNAMON PO Take by mouth       cyanocobalamin (VITAMIN B-12) 1000 MCG tablet Take 1,000 mcg by mouth daily      Co-Enzyme Q-10 100 MG CAPS Take by mouth      Multiple Vitamins-Minerals (MULTI VITAMIN/MINERALS PO) Take by mouth      HYDROcodone-acetaminophen (VICODIN) 5-500 MG per tablet 1-2 po q 6 hours prn pain MDD=6 180 tablet 1    cholecalciferol (VITAMIN D) 1000 UNIT tablet Take 2,000 Units by mouth daily          FISH OIL by Does not apply route daily.      acetaminophen (TYLENOL) 500 MG tablet Take 500 mg by mouth 2 times daily.      pravastatin (PRAVACHOL) 40 MG tablet Take 1 tablet (40 mg total) by mouth daily 90 tablet 5    warfarin (COUMADIN) 4 MG tablet Take 1 tablet (4 mg total) by mouth daily 30 tablet 5     No current facility-administered medications for this visit.          Physical Exam:  Vitals:    11/07/15 0842   BP: 114/70   Pulse: 86   Weight: 97.1 kg (214 lb)       Constitutional: Comfortable, no apparent distress, obese  Eyes:  Equal, round and reactive, conjunctivae clear, lids normal  ENT/mouth: Moist mucous membranes, no oropharyngeal lesions, good dentition, hearing intact.  Cerumen was removed from bilateral ears with the use of an illuminated curette and otoscope.  Resp: Unlabored breathing effort, lungs clear to auscultation bilaterally, no wheezes, no rhonchi  CV: Regular S1/S2, no murmurs, no peripheral edema  Abd: Abdomen soft, not tender, not distended, bowel sounds present, no hepatosplenomegaly  MSK: No joint tenderness, no inflammation of joints  Skin:  Warm, dry, good turgor, no rashes  Neuro: Speech intact, no motor deficits, no sensory deficits  Psych:  Alert and oriented x3, normal affect and mood      Assessment:  81 y.o.male today presenting for f/u of various issues      Plan:    1.  DM type II - still poor control, but recent change w/victoza   - con't diet and exercise   - con't victoza, amaryl (glimiperide) and metformin   - f/u with endocrine service   - last HbA1c done 09/05/2015 = 8.7%    -  f/u HbA1c   Diabetes metrics:   Last ophthalmology exam: done Spring 2016   Last podiatry exam: done 05/10/2015 by myself - will give info for podiatry (Dr. Andreas Newport)   Lesions on feet: none   Checks feet daily/nightly: yes   - counseling on daily foot checks done today    Monofilament test of feet: feels '@0' .62m bilaterally    Tobacco use: never      2. Chronic LE pain - prior improvement w/acupuncture   - will give info for different acupuncturist    - con't gabapentin   - avoiding vicodin    3. Protein S deficiency -    - on coumadin chronically   - f/u INR monthly/PRN    4. Elevated BP's - normal today    5. Dementia -    - con't aricept/namenda    - will need to f/u with [facility]    6.  Cerumen impaction - bilateral   - removed today      Flu shot today    Follow up: 6 months    LJacklyn Shell MD  Internal Medicine

## 2015-11-12 ENCOUNTER — Other Ambulatory Visit: Payer: Self-pay | Admitting: Primary Care

## 2015-11-14 ENCOUNTER — Other Ambulatory Visit: Payer: Self-pay | Admitting: Urology

## 2015-11-14 MED ORDER — MIRABEGRON ER 50 MG PO TB24 *I*
50.0000 mg | ORAL_TABLET | Freq: Every day | ORAL | 5 refills | Status: DC
Start: 2015-11-14 — End: 2015-11-16

## 2015-11-16 ENCOUNTER — Ambulatory Visit: Payer: Self-pay | Admitting: Primary Care

## 2015-11-16 ENCOUNTER — Other Ambulatory Visit: Payer: Self-pay | Admitting: Primary Care

## 2015-11-16 ENCOUNTER — Ambulatory Visit
Admission: RE | Admit: 2015-11-16 | Discharge: 2015-11-16 | Disposition: A | Discharge status: Home / Self Care | Payer: Self-pay | Source: Ambulatory Visit | Attending: Primary Care | Admitting: Primary Care

## 2015-11-16 ENCOUNTER — Other Ambulatory Visit: Payer: Self-pay | Admitting: Urology

## 2015-11-16 DIAGNOSIS — E8809 Other disorders of plasma-protein metabolism, not elsewhere classified: Secondary | ICD-10-CM

## 2015-11-16 DIAGNOSIS — D6859 Other primary thrombophilia: Secondary | ICD-10-CM

## 2015-11-16 DIAGNOSIS — I4891 Unspecified atrial fibrillation: Secondary | ICD-10-CM

## 2015-11-16 DIAGNOSIS — I82409 Acute embolism and thrombosis of unspecified deep veins of unspecified lower extremity: Secondary | ICD-10-CM

## 2015-11-16 LAB — PROTIME-INR
INR: 2.3 — ABNORMAL HIGH (ref 0.9–1.1)
Protime: 27.1 s — ABNORMAL HIGH (ref 10.0–12.9)

## 2015-11-16 MED ORDER — GLIMEPIRIDE 2 MG PO TABS *I*
2.0000 mg | ORAL_TABLET | Freq: Every day | ORAL | 3 refills | Status: DC
Start: 2015-11-16 — End: 2015-11-17

## 2015-11-16 MED ORDER — MELOXICAM 7.5 MG PO TABS *I*
7.5000 mg | ORAL_TABLET | Freq: Every day | ORAL | 3 refills | Status: DC
Start: 2015-11-16 — End: 2017-01-18

## 2015-11-16 MED ORDER — DONEPEZIL HCL 10 MG PO TABS *I*
10.0000 mg | ORAL_TABLET | Freq: Every day | ORAL | 3 refills | Status: DC
Start: 2015-11-16 — End: 2017-09-23

## 2015-11-16 MED ORDER — COUMADIN 4 MG PO TABS
4.0000 mg | ORAL_TABLET | Freq: Every day | ORAL | 3 refills | Status: DC
Start: 2015-11-16 — End: 2017-01-25

## 2015-11-16 MED ORDER — LIRAGLUTIDE 18 MG/3ML SC SOPN *I*
1.8000 mg | PEN_INJECTOR | Freq: Every day | SUBCUTANEOUS | 4 refills | Status: DC
Start: 2015-11-16 — End: 2015-11-17

## 2015-11-16 MED ORDER — PRAVASTATIN SODIUM 40 MG PO TABS *I*
40.0000 mg | ORAL_TABLET | Freq: Every day | ORAL | 3 refills | Status: DC
Start: 2015-11-16 — End: 2016-05-18

## 2015-11-16 MED ORDER — METFORMIN HCL 1000 MG PO TABS *I*
1000.0000 mg | ORAL_TABLET | Freq: Two times a day (BID) | ORAL | 4 refills | Status: DC
Start: 2015-11-16 — End: 2015-11-17

## 2015-11-16 MED ORDER — GABAPENTIN 300 MG PO CAPSULE *I*
300.0000 mg | ORAL_CAPSULE | Freq: Two times a day (BID) | ORAL | 3 refills | Status: DC
Start: 2015-11-16 — End: 2016-07-04

## 2015-11-16 MED ORDER — MEMANTINE HCL 10 MG PO TABS *I*
ORAL_TABLET | ORAL | 3 refills | Status: DC
Start: 2015-11-16 — End: 2016-08-28

## 2015-11-16 NOTE — Telephone Encounter (Signed)
Jon [name] called asking for paper scripts and INR standing order to be printed and mailed to her as Jon [name] is going down [place] for the winter.  Orders placed and will mail when printed.

## 2015-11-16 NOTE — Progress Notes (Signed)
ORDER:  Please notify inr in range;  no dose change confirm current dose  Repeat in 3 weeks

## 2015-11-16 NOTE — Patient Instructions (Signed)
Reviewed with Mrs paddack

## 2015-11-16 NOTE — Telephone Encounter (Signed)
Suggested to patient that a 90 days supply might be better (rx set up for this) and if they need a prescription to call us as many pharmacies are now accepting prescriptions only electronically.

## 2015-11-16 NOTE — Telephone Encounter (Signed)
Patient wife calling requesting hard copy prescriptions be mailed to patient home address listed due to leaving town in mid December for 3 months.Medications are mirabegron (MYRBETRIQ) 50 MG 24 hr tablet and finasteride (PROSCAR) 5 MG tablet. [name] can be reached at [phone] to confirm.

## 2015-11-17 ENCOUNTER — Ambulatory Visit: Payer: Self-pay

## 2015-11-17 ENCOUNTER — Encounter: Payer: Self-pay | Admitting: Endocrinology

## 2015-11-17 ENCOUNTER — Ambulatory Visit: Payer: Self-pay | Admitting: Endocrinology

## 2015-11-17 VITALS — BP 122/68 | HR 71 | Ht 70.5 in | Wt 215.7 lb

## 2015-11-17 DIAGNOSIS — E8809 Other disorders of plasma-protein metabolism, not elsewhere classified: Secondary | ICD-10-CM

## 2015-11-17 DIAGNOSIS — IMO0002 Reserved for concepts with insufficient information to code with codable children: Secondary | ICD-10-CM

## 2015-11-17 LAB — POCT HEMOGLOBIN A1C: Hemoglobin A1C,POC: 7.3 % — ABNORMAL HIGH (ref 4.0–6.0)

## 2015-11-17 MED ORDER — LIRAGLUTIDE 18 MG/3ML SC SOPN *I*
1.8000 mg | PEN_INJECTOR | Freq: Every day | SUBCUTANEOUS | 4 refills | Status: DC
Start: 2015-11-17 — End: 2016-09-07

## 2015-11-17 MED ORDER — MIRABEGRON ER 50 MG PO TB24 *I*
50.0000 mg | ORAL_TABLET | Freq: Every day | ORAL | 2 refills | Status: DC
Start: 2015-11-17 — End: 2015-12-15

## 2015-11-17 MED ORDER — METFORMIN HCL 1000 MG PO TABS *I*
1000.0000 mg | ORAL_TABLET | Freq: Two times a day (BID) | ORAL | 4 refills | Status: DC
Start: 2015-11-17 — End: 2016-07-31

## 2015-11-17 MED ORDER — GLIMEPIRIDE 2 MG PO TABS *I*
2.0000 mg | ORAL_TABLET | Freq: Every day | ORAL | 3 refills | Status: DC
Start: 2015-11-17 — End: 2015-11-17

## 2015-11-17 MED ORDER — FINASTERIDE 5 MG PO TABS *I*
ORAL_TABLET | ORAL | 3 refills | Status: DC
Start: 2015-11-17 — End: 2015-12-15

## 2015-11-17 NOTE — Progress Notes (Signed)
Subjective:      Jon [name] is a 81 y.o. male who presents for follow-up of Uncontrolled type 2 diabetes.      INTERVAL HISTORY: the patient denies any new medical problems. He feels generally well.   --Treatment history:  --Current management:  Current diabetic medications include: Glimepiride 2 mg in the morning/Victoza 1.8 mg daily/Metformin twice a day   -- Self-glucose monitoring:1-2x/day; BGs usually less than 140 mg/dL  -- Hyperglycemia: with eating indiscretions  -- Hypoglycemia: denies confusion, dizziness, headache, seizures, sweating, nightmares and LOC.  Hypoglycemia unawareness: denies  -- Endocrine: denies polyuria, polydipsia.  -- CV: denies chest pain, shortness of breath.  --Extremities:     -- Hands: denies tingling,numbness     -- Feet: denies tingling, numbness  -- Eyes: denies history of laser treatment, double/blurry vision, retinopathy, cataracts, glaucoma. Last eye exam: within 1 year.  -- Kidney: denies history of nephropathy.  -- Diet: no specific  -- Exercise: plays golf/walks    Patient's medications, problems and allergies  were reviewed and updated as appropriate.      Current Outpatient Prescriptions   Medication Sig    mirabegron (MYRBETRIQ) 50 MG 24 hr tablet Take 1 tablet (50 mg total) by mouth daily    finasteride (PROSCAR) 5 MG tablet TAKE ONE TABLET BY MOUTH ONCE DAILY FOR USE BY MEN ONLY    Liraglutide (VICTOZA) 18 MG/3ML injection Inject 1.8 mg into the skin daily    metFORMIN (GLUCOPHAGE) 1000 MG tablet Take 1 tablet (1,000 mg total) by mouth 2 times daily (with meals)    pravastatin (PRAVACHOL) 40 MG tablet Take 1 tablet (40 mg total) by mouth daily    memantine (NAMENDA) 10 MG tablet Take 1 Tablet by mouth Twice a Day    meloxicam (MOBIC) 7.5 MG tablet Take 1 tablet (7.5 mg total) by mouth daily    gabapentin (GABAPENTIN) 300 MG capsule Take 1 capsule (300 mg total) by mouth 2 times daily    donepezil (ARICEPT) 10 MG tablet Take 1 tablet (10 mg total) by  mouth daily    COUMADIN 4 MG tablet Take 1 tablet (4 mg total) by mouth daily    insulin pen needle (BD ULTRA-FINE PEN NEEDLE NANO) 32G X 4 MM Use 1 times a day as instructed with Victoza pens    blood glucose monitor (FREESTYLE LITE) kit Use as instructed    FREESTYLE LITE test strip To be used twice a day    lancets (ONETOUCH ULTRASOFT) Use   2  times per day as instructed for blood glucose testing.    warfarin (COUMADIN) 4 MG tablet Take 1 tablet (4 mg total) by mouth daily    COUMADIN 2.5 MG tablet TAKE 2 TABLETS BY MOUTH ON MONDAY AND THURSDAY. TAKE 1 AND 1/2 TABLETS BY MOUTH THE REST OF DAYS OR AS DIRECTED    Blood Glucose Calibration (OT ULTRA/FASTTK CNTRL SOLN) SOLN As directed    CINNAMON PO Take by mouth    cyanocobalamin (VITAMIN B-12) 1000 MCG tablet Take 1,000 mcg by mouth daily    Co-Enzyme Q-10 100 MG CAPS Take by mouth    Multiple Vitamins-Minerals (MULTI VITAMIN/MINERALS PO) Take by mouth    HYDROcodone-acetaminophen (VICODIN) 5-500 MG per tablet 1-2 po q 6 hours prn pain MDD=6    cholecalciferol (VITAMIN D) 1000 UNIT tablet Take 2,000 Units by mouth daily        FISH OIL by Does not apply route daily.    acetaminophen (TYLENOL)  500 MG tablet Take 500 mg by mouth 2 times daily.        Patient Active Problem List   Diagnosis Code    Sinus Bradycardia I49.8    Dementia F06.8    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    Type II Diabetes Mellitus E11.9    Obesity E66.9    Thrombophlebitis Of Deep Vessels Of The Lower Extremity 451.1    Protein S Deficiency E88.09    Epistaxis R04.0    Urgency of urination R39.15    BPH (benign prostatic hyperplasia) N40.0    Penile lesion N48.9    Penile rash R21    Opiate analgesic contract exists Z02.89    Lower urinary tract symptoms (LUTS) R39.9         Objective:        Visit Vitals    BP 122/68    Pulse 71    Ht 1.791 m (5' 10.5")    Wt 97.8 kg (215 lb 11.2 oz)    BMI 30.51 kg/m2       GENERAL APPEARANCE: well developed, well nourished, no acute  distress; aware and alert  HEENT: PERLA, EOMI, no lid lag, pink conjunctiva  NECK: supple, no thyromegaly, no lymphadenopathy  HEART: RRR, normal S1, S2, no MRGT, no JVD or carotid bruit  CHEST: lungs clear to auscultation bilaterally  EXTREMITIES: no clubbing, cyanosis, or edema. Pedal pulses 2+ bilaterally. Both feet clean without deformity, callous, ulceration, or fungal infection.  NEUROLOGICAL: Alert and oriented x 3. Sensitive to monofilament.     Lab Review    HbA1c - 7.3% (down from 8.7%)              Assessment:        Jon [name] is a 81 y.o. male with Uncontrolled type 2 diabetes mellitus. The patient's control has improved dramatically with the addition of Victoza. We will stop the Amaryl seeing he is only taking 2 mg daily and his HbA1c has improved significantly.         Plan:        STUDIES ORDERED: None    TREATMENT:  1) Stop Amaryl  2) No other changes    RETURN IN: 6 months when back from [place]    Thank you for allowing me to participate in the care of this patient.  Please feel free to contact me if you have any questions or concerns.

## 2015-12-01 ENCOUNTER — Ambulatory Visit
Admission: RE | Admit: 2015-12-01 | Discharge: 2015-12-01 | Disposition: A | Discharge status: Home / Self Care | Payer: Self-pay | Source: Ambulatory Visit | Attending: Primary Care | Admitting: Primary Care

## 2015-12-01 ENCOUNTER — Ambulatory Visit: Payer: Self-pay

## 2015-12-01 DIAGNOSIS — E8809 Other disorders of plasma-protein metabolism, not elsewhere classified: Secondary | ICD-10-CM

## 2015-12-01 DIAGNOSIS — D6859 Other primary thrombophilia: Secondary | ICD-10-CM

## 2015-12-01 DIAGNOSIS — I82409 Acute embolism and thrombosis of unspecified deep veins of unspecified lower extremity: Secondary | ICD-10-CM

## 2015-12-01 LAB — PROTIME-INR
INR: 3.1 — ABNORMAL HIGH (ref 0.9–1.1)
Protime: 37.5 s — ABNORMAL HIGH (ref 10.0–12.9)

## 2015-12-01 NOTE — Patient Instructions (Signed)
Spoke with Jon [name] and she states no new medications for [name], she will have [name] continue 4 mg daily and recheck again in two weeks.

## 2015-12-09 ENCOUNTER — Other Ambulatory Visit (HOSPITAL_COMMUNITY)
Admission: RE | Admit: 2015-12-09 | Discharge: 2015-12-09 | Disposition: A | Discharge status: Home / Self Care | Payer: Medicare Other | Source: Other Acute Inpatient Hospital

## 2015-12-09 ENCOUNTER — Encounter: Payer: Self-pay | Admitting: Gastroenterology

## 2015-12-09 DIAGNOSIS — Z0289 Encounter for other administrative examinations: Secondary | ICD-10-CM | POA: Insufficient documentation

## 2015-12-09 LAB — PROTIME-INR
INR: 2.9 — ABNORMAL HIGH (ref 0.00–1.49)
Prothrombin Time: 29.9 seconds — ABNORMAL HIGH (ref 11.6–15.2)

## 2015-12-14 ENCOUNTER — Telehealth: Payer: Self-pay | Admitting: Primary Care

## 2015-12-14 ENCOUNTER — Other Ambulatory Visit: Payer: Self-pay | Admitting: Urology

## 2015-12-14 ENCOUNTER — Encounter: Payer: Self-pay | Admitting: Primary Care

## 2015-12-14 ENCOUNTER — Ambulatory Visit: Payer: Self-pay

## 2015-12-14 DIAGNOSIS — E8809 Other disorders of plasma-protein metabolism, not elsewhere classified: Secondary | ICD-10-CM

## 2015-12-14 LAB — PROTIME-INR: INR: 2.9

## 2015-12-14 NOTE — Telephone Encounter (Signed)
INR done and addressed.

## 2015-12-14 NOTE — Telephone Encounter (Signed)
Jon [name] calling to request prescription(s) mirabegron (MYRBETRIQ) 50 MG 24 hr tablet to be sent to [facility], located at [facility].    Is patient out of the medication? yes  Does the patient have questions regarding the medication for the nurse? no    Patient can be reached if necessary at [phone]      The phone was cutting out and the pharmacy was not verified, Writer was trying to get more information and call was lost.

## 2015-12-14 NOTE — Patient Instructions (Signed)
Spoke with [name] and to continue 4 mg daily and recheck again in 4 weeks.

## 2015-12-14 NOTE — Telephone Encounter (Signed)
Patient returned call and stated that he would also like the finasteride (PROSCAR) 5 MG tablet  Called in as well to [facility] in [place]. Pharmacy has been updated on chart.

## 2015-12-14 NOTE — Telephone Encounter (Signed)
[name] called stating that [name] had his INR done on Friday, 12/23 and our office hasn't received the results yet.  I called the lab at [phone] and left message to fax over the results so that the INR can be addressed.

## 2015-12-15 ENCOUNTER — Other Ambulatory Visit: Payer: Self-pay | Admitting: Urology

## 2015-12-15 MED ORDER — FINASTERIDE 5 MG PO TABS *I*
ORAL_TABLET | ORAL | 3 refills | Status: DC
Start: 2015-12-15 — End: 2016-02-06

## 2015-12-15 MED ORDER — MIRABEGRON ER 50 MG PO TB24 *I*
50.0000 mg | ORAL_TABLET | Freq: Every day | ORAL | 2 refills | Status: DC
Start: 2015-12-15 — End: 2016-07-31

## 2015-12-15 NOTE — Telephone Encounter (Signed)
Original message sent to Dr. Werner Lean, Arnette Felts NP covering calls for Dr. Werner Lean today

## 2015-12-15 NOTE — Telephone Encounter (Signed)
This is a duplicate call, pt wanted to have another medication refilled as well.

## 2015-12-18 MED ORDER — MIRABEGRON ER 50 MG PO TB24 *I*
50.0000 mg | ORAL_TABLET | Freq: Every day | ORAL | 2 refills | Status: DC
Start: 2015-12-18 — End: 2016-07-31

## 2016-01-09 ENCOUNTER — Other Ambulatory Visit (HOSPITAL_COMMUNITY)
Admission: RE | Admit: 2016-01-09 | Discharge: 2016-01-09 | Disposition: A | Discharge status: Home / Self Care | Payer: Medicare Other | Source: Ambulatory Visit | Attending: Internal Medicine | Admitting: Internal Medicine

## 2016-01-09 ENCOUNTER — Encounter: Payer: Self-pay | Admitting: Gastroenterology

## 2016-01-09 ENCOUNTER — Telehealth: Payer: Self-pay | Admitting: Primary Care

## 2016-01-09 DIAGNOSIS — Z029 Encounter for administrative examinations, unspecified: Secondary | ICD-10-CM | POA: Diagnosis present

## 2016-01-09 DIAGNOSIS — I82409 Acute embolism and thrombosis of unspecified deep veins of unspecified lower extremity: Secondary | ICD-10-CM

## 2016-01-09 LAB — PROTIME-INR
INR: 3.14
INR: 3.14 — AB (ref 0.00–1.49)
PROTHROMBIN TIME: 31.7 s — AB (ref 11.6–15.2)

## 2016-01-09 NOTE — Telephone Encounter (Signed)
Jon [name] called they are in [facility] and need a new standing order for Norbs INR, the lab sent the standing order to billing and it isn't on file.  They are requesting a new standing order. Order printed and to be faxed to [phone].

## 2016-01-13 ENCOUNTER — Telehealth: Payer: Self-pay | Admitting: Primary Care

## 2016-01-13 NOTE — Telephone Encounter (Signed)
[name] called asking if we received [name]'s INR from lab in [facility] [place] and I told her we hadn't.  She was going to call them and have them fax the results to [place].

## 2016-01-16 ENCOUNTER — Encounter: Payer: Self-pay | Admitting: Primary Care

## 2016-01-17 ENCOUNTER — Ambulatory Visit: Payer: Self-pay

## 2016-01-17 DIAGNOSIS — E8809 Other disorders of plasma-protein metabolism, not elsewhere classified: Secondary | ICD-10-CM

## 2016-01-17 NOTE — Patient Instructions (Signed)
Spoke with Jon [name] and reviewed INR with her and [name], nothing has changed, will continue 4 mg daily and recheck again on 2/6.

## 2016-01-17 NOTE — Telephone Encounter (Signed)
INR done and addressed see flow sheet

## 2016-01-24 ENCOUNTER — Other Ambulatory Visit (HOSPITAL_COMMUNITY)
Admission: RE | Admit: 2016-01-24 | Discharge: 2016-01-24 | Disposition: A | Discharge status: Home / Self Care | Payer: Medicare Other | Source: Ambulatory Visit | Attending: Internal Medicine | Admitting: Internal Medicine

## 2016-01-24 ENCOUNTER — Ambulatory Visit: Payer: Self-pay

## 2016-01-24 ENCOUNTER — Encounter: Payer: Self-pay | Admitting: Primary Care

## 2016-01-24 ENCOUNTER — Encounter: Payer: Self-pay | Admitting: Gastroenterology

## 2016-01-24 DIAGNOSIS — Z029 Encounter for administrative examinations, unspecified: Secondary | ICD-10-CM | POA: Insufficient documentation

## 2016-01-24 DIAGNOSIS — E8809 Other disorders of plasma-protein metabolism, not elsewhere classified: Secondary | ICD-10-CM

## 2016-01-24 LAB — PROTIME-INR
INR: 2.23 — AB (ref 0.00–1.49)
INR: 2.25
PROTHROMBIN TIME: 24.5 s — AB (ref 11.6–15.2)

## 2016-01-24 NOTE — Patient Instructions (Signed)
Left message on personal voice mail with results of INR and to continue 4 mg daily and recheck again in two weeks.

## 2016-02-06 ENCOUNTER — Other Ambulatory Visit (HOSPITAL_COMMUNITY)
Admission: RE | Admit: 2016-02-06 | Discharge: 2016-02-06 | Disposition: A | Discharge status: Home / Self Care | Payer: Medicare Other | Source: Ambulatory Visit | Attending: Family Medicine | Admitting: Family Medicine

## 2016-02-06 ENCOUNTER — Encounter: Payer: Self-pay | Admitting: Primary Care

## 2016-02-06 ENCOUNTER — Other Ambulatory Visit: Payer: Self-pay | Admitting: Primary Care

## 2016-02-06 ENCOUNTER — Ambulatory Visit: Payer: Self-pay

## 2016-02-06 ENCOUNTER — Encounter: Payer: Self-pay | Admitting: Gastroenterology

## 2016-02-06 DIAGNOSIS — Z01812 Encounter for preprocedural laboratory examination: Secondary | ICD-10-CM | POA: Diagnosis present

## 2016-02-06 DIAGNOSIS — E8809 Other disorders of plasma-protein metabolism, not elsewhere classified: Secondary | ICD-10-CM

## 2016-02-06 LAB — PROTIME-INR
INR: 4.23
INR: 4.23 — ABNORMAL HIGH (ref 0.00–1.49)
PROTHROMBIN TIME: 39.6 s — AB (ref 11.6–15.2)

## 2016-02-06 MED ORDER — FINASTERIDE 5 MG PO TABS *I*
ORAL_TABLET | ORAL | 3 refills | Status: DC
Start: 2016-02-06 — End: 2017-05-16

## 2016-02-06 NOTE — Patient Instructions (Signed)
Spoke with Jon [name] and no changes to meds or foods, will go to 2 mg on Mon, Thurs and 4 mg rest of week and recheck again in 2 weeks.

## 2016-02-22 ENCOUNTER — Telehealth: Payer: Self-pay | Admitting: Primary Care

## 2016-02-22 NOTE — Telephone Encounter (Signed)
Spoke with [name] and reminded her to have [name] have INR done, she stated she would tell him to do so.

## 2016-02-24 ENCOUNTER — Other Ambulatory Visit (HOSPITAL_COMMUNITY)
Admission: AD | Admit: 2016-02-24 | Discharge: 2016-02-24 | Disposition: A | Discharge status: Home / Self Care | Payer: Medicare Other | Source: Ambulatory Visit | Attending: Family Medicine | Admitting: Family Medicine

## 2016-02-24 ENCOUNTER — Encounter: Payer: Self-pay | Admitting: Gastroenterology

## 2016-02-24 ENCOUNTER — Ambulatory Visit: Payer: Self-pay

## 2016-02-24 ENCOUNTER — Encounter: Payer: Self-pay | Admitting: Primary Care

## 2016-02-24 DIAGNOSIS — Z029 Encounter for administrative examinations, unspecified: Secondary | ICD-10-CM | POA: Diagnosis present

## 2016-02-24 DIAGNOSIS — E8809 Other disorders of plasma-protein metabolism, not elsewhere classified: Secondary | ICD-10-CM

## 2016-02-24 LAB — PROTIME-INR
INR: 2.52
INR: 2.52 — ABNORMAL HIGH (ref 0.00–1.49)
Prothrombin Time: 26.9 seconds — ABNORMAL HIGH (ref 11.6–15.2)

## 2016-02-24 NOTE — Telephone Encounter (Signed)
INR done and addressed.

## 2016-02-24 NOTE — Patient Instructions (Signed)
Left message on Jon [name]'s personal voice mail with results of [name]'s INR and to continue  2 mg on Mon, Thurs and 4 mg rest of week and recheck on April 6th.

## 2016-03-22 ENCOUNTER — Telehealth: Payer: Self-pay | Admitting: Primary Care

## 2016-03-22 NOTE — Telephone Encounter (Signed)
I spoke with Jon [name] and she knew it was time for [name] to have his INR done, I did ask if he could have it done today before the weekend, and she stated yes.

## 2016-03-23 ENCOUNTER — Other Ambulatory Visit (HOSPITAL_COMMUNITY)
Admission: RE | Admit: 2016-03-23 | Discharge: 2016-03-23 | Disposition: A | Discharge status: Home / Self Care | Payer: Medicare Other | Source: Other Acute Inpatient Hospital | Attending: Family Medicine | Admitting: Family Medicine

## 2016-03-23 ENCOUNTER — Ambulatory Visit: Payer: Self-pay

## 2016-03-23 ENCOUNTER — Encounter: Payer: Self-pay | Admitting: Gastroenterology

## 2016-03-23 ENCOUNTER — Encounter: Payer: Self-pay | Admitting: Primary Care

## 2016-03-23 DIAGNOSIS — Z029 Encounter for administrative examinations, unspecified: Secondary | ICD-10-CM | POA: Diagnosis present

## 2016-03-23 DIAGNOSIS — E8809 Other disorders of plasma-protein metabolism, not elsewhere classified: Secondary | ICD-10-CM

## 2016-03-23 LAB — PROTIME-INR
INR: 2.7 — ABNORMAL HIGH (ref 0.00–1.49)
INR: 2.7
Prothrombin Time: 28.3 seconds — ABNORMAL HIGH (ref 11.6–15.2)

## 2016-03-23 NOTE — Patient Instructions (Addendum)
Called [name] and left message on personal voice mail with results of INR and to have [name] continue  2 mg on Mon, Thurs and 4 mg rest of week and recheck again on 5/4.

## 2016-03-23 NOTE — Progress Notes (Signed)
ORDER:  Please notify inr in range;  no dose change confirm current dose  Repeat in 4 weeks

## 2016-04-11 ENCOUNTER — Other Ambulatory Visit: Payer: Self-pay | Admitting: Primary Care

## 2016-04-19 ENCOUNTER — Other Ambulatory Visit
Admission: RE | Admit: 2016-04-19 | Discharge: 2016-04-19 | Disposition: A | Discharge status: Home / Self Care | Payer: Self-pay | Source: Ambulatory Visit | Attending: Primary Care | Admitting: Primary Care

## 2016-04-19 DIAGNOSIS — I82409 Acute embolism and thrombosis of unspecified deep veins of unspecified lower extremity: Secondary | ICD-10-CM

## 2016-04-19 DIAGNOSIS — D6859 Other primary thrombophilia: Secondary | ICD-10-CM

## 2016-04-19 LAB — PROTIME-INR
INR: 2.2 — ABNORMAL HIGH (ref 0.9–1.1)
Protime: 26.4 s — ABNORMAL HIGH (ref 10.0–12.9)

## 2016-04-20 ENCOUNTER — Ambulatory Visit: Payer: Self-pay | Admitting: Primary Care

## 2016-04-20 ENCOUNTER — Encounter: Payer: Self-pay | Admitting: Primary Care

## 2016-04-20 DIAGNOSIS — E8809 Other disorders of plasma-protein metabolism, not elsewhere classified: Secondary | ICD-10-CM

## 2016-04-20 NOTE — Patient Instructions (Signed)
Reviewed with Adah Salvage.

## 2016-04-20 NOTE — Telephone Encounter (Signed)
-----   Message from Edythe Clarity, MD sent at 04/19/2016  8:31 PM EDT -----  INR in target range - have patient continue current dose of coumadin, and recheck INR in 4 weeks.  Thank you!

## 2016-04-20 NOTE — Telephone Encounter (Signed)
error 

## 2016-04-27 ENCOUNTER — Telehealth: Payer: Self-pay

## 2016-04-27 ENCOUNTER — Ambulatory Visit: Payer: Self-pay | Admitting: Primary Care

## 2016-04-27 NOTE — Telephone Encounter (Signed)
Patient's wife called this morning about Caedon's appointment that was for 8:20 AM this morning. [name] stated that he did not realize what time it was for, but the call reminder form did indicate that he was called and he confirmed his appointment for 04/27/16. I did RS patient's appointment. Thanks

## 2016-04-27 NOTE — Telephone Encounter (Signed)
The patient no showed for his appointment, please contact the patient to verify if the patient needs to reschedule. Thank you

## 2016-05-01 ENCOUNTER — Other Ambulatory Visit: Payer: Self-pay | Admitting: Primary Care

## 2016-05-07 ENCOUNTER — Ambulatory Visit: Payer: Self-pay | Admitting: Endocrinology

## 2016-05-11 ENCOUNTER — Other Ambulatory Visit: Payer: Self-pay | Admitting: Endocrinology

## 2016-05-11 ENCOUNTER — Telehealth: Payer: Self-pay | Admitting: Endocrinology

## 2016-05-11 MED ORDER — INSULIN PEN NEEDLE 32G X 4 MM MISC *A*
3 refills | Status: DC
Start: 2016-05-11 — End: 2017-05-27

## 2016-05-11 NOTE — Telephone Encounter (Signed)
Joan-Wife calling requesting a script for insulin pen needle (BD ULTRA-FINE PEN NEEDLE NANO) 32G X 4 MM be sent to Pharmacy: [facility].     Patient is all out of needles.    [name] would like to know if patient should still be taking the Metformin.    [name] or patient can be reached at [phone] to speak with.

## 2016-05-11 NOTE — Telephone Encounter (Signed)
I spoke with [name] and confirmed that patient should continue Metformin. Prescription for needles sent.

## 2016-05-15 ENCOUNTER — Other Ambulatory Visit
Admission: RE | Admit: 2016-05-15 | Discharge: 2016-05-15 | Disposition: A | Discharge status: Home / Self Care | Payer: Self-pay | Source: Ambulatory Visit | Attending: Primary Care | Admitting: Primary Care

## 2016-05-15 DIAGNOSIS — I82409 Acute embolism and thrombosis of unspecified deep veins of unspecified lower extremity: Secondary | ICD-10-CM

## 2016-05-15 DIAGNOSIS — D6859 Other primary thrombophilia: Secondary | ICD-10-CM

## 2016-05-15 LAB — PROTIME-INR
INR: 2.6 — ABNORMAL HIGH (ref 0.9–1.1)
Protime: 30.5 s — ABNORMAL HIGH (ref 10.0–12.9)

## 2016-05-16 ENCOUNTER — Ambulatory Visit: Payer: Self-pay

## 2016-05-16 DIAGNOSIS — E8809 Other disorders of plasma-protein metabolism, not elsewhere classified: Secondary | ICD-10-CM

## 2016-05-16 NOTE — Patient Instructions (Signed)
Called [facility] and left message on home and cell phone with results of INR and to continue  2 mg on Mon, Thurs and 4 mg rest of week and recheck again in one month

## 2016-05-18 ENCOUNTER — Ambulatory Visit: Payer: Self-pay | Admitting: Primary Care

## 2016-05-18 ENCOUNTER — Encounter: Payer: Self-pay | Admitting: Primary Care

## 2016-05-18 VITALS — BP 132/84 | HR 76 | Resp 14 | Wt 201.0 lb

## 2016-05-18 DIAGNOSIS — M79661 Pain in right lower leg: Secondary | ICD-10-CM

## 2016-05-18 DIAGNOSIS — F0390 Unspecified dementia without behavioral disturbance: Secondary | ICD-10-CM

## 2016-05-18 DIAGNOSIS — I10 Essential (primary) hypertension: Secondary | ICD-10-CM

## 2016-05-18 DIAGNOSIS — D6859 Other primary thrombophilia: Secondary | ICD-10-CM

## 2016-05-18 DIAGNOSIS — M545 Low back pain, unspecified: Secondary | ICD-10-CM

## 2016-05-18 DIAGNOSIS — M791 Myalgia, unspecified site: Secondary | ICD-10-CM

## 2016-05-18 DIAGNOSIS — G8929 Other chronic pain: Secondary | ICD-10-CM

## 2016-05-18 DIAGNOSIS — M79662 Pain in left lower leg: Secondary | ICD-10-CM

## 2016-05-18 DIAGNOSIS — Z1321 Encounter for screening for nutritional disorder: Secondary | ICD-10-CM

## 2016-05-18 DIAGNOSIS — E119 Type 2 diabetes mellitus without complications: Secondary | ICD-10-CM

## 2016-05-18 DIAGNOSIS — Z0000 Encounter for general adult medical examination without abnormal findings: Secondary | ICD-10-CM

## 2016-05-18 DIAGNOSIS — Z23 Encounter for immunization: Secondary | ICD-10-CM

## 2016-05-18 MED ORDER — ROSUVASTATIN CALCIUM 10 MG PO TABS *I*
10.0000 mg | ORAL_TABLET | Freq: Every day | ORAL | 5 refills | Status: DC
Start: 2016-05-18 — End: 2016-11-17

## 2016-05-18 NOTE — Progress Notes (Signed)
CC: "my back is bothering me"    HPI:  Jon [name] is a 82 y.o.male here for f/u of DM and various other issues.      Per patient:    1.  DM type II -  Has lost a total of 30 lbs since 2014 - Victoza helping (appetite).  Last HbA1c much improved - 7.3 (as below).  Still needs to see endocrine service.    Lab Results   Component Value Date    HA1C 7.3 (H) 11/17/2015      Patient is currently on victoza 1.63m daily and metformin 10065mBID.      2.  Elevated BP's - decent control, not on meds.      3.  Chronic lower back pain - with Paget's, ABI 05/2015 was normal.  Has been golfing, "which doesn't help".  Using gabapentin 60040mHS, rare use of hydrocodone, trying not to use tyelnol.  Hasn't gone back to acupuncture in a while - "it helped, but didn't take it away".      4.  Pain/pressure in bilateral calves and ankles - also reports joints are "sore", with "numbness" in knees.   Wondering whether statin is contributing to pain.      5.  Dementia - per wife, is getting worse.  Can't recall conversations.  Is on aricept/namenda.  Some struggles w/golf league - computers is a stressor.      6.  Protein S - on chronic coumadin. Last INR=2.6 - good control    Review of systems:   No fever or chills  No headache  No sore throat  No chest pain  No shortness of breath  No cough  No change of appetite  No nausea  No vomiting  No abdominal pain  No stool changes  No urinary symptoms  No dizziness or lightheadedness  No depression  No skin rashes  (+) chronic lower back pain    Past Medical History:   Diagnosis Date    Arthritis     BPH (benign prostatic hypertrophy)     Dementia     mild    DM type 2 (diabetes mellitus, type 2)     Paget's disease of bone     Protein S deficiency     On chronic anticoagulation    Sinus bradycardia     Varicella      Past Surgical History:   Procedure Laterality Date    CHOLECYSTECTOMY      JOINT REPLACEMENT      KNEE REPLACEMENT      SKIN GRAFT      TONSILLECTOMY          Allergies:   Allergies   Allergen Reactions    Bee Venom Other (See Comments)     Red and swelling    No Known Latex Allergy      Created by Conversion - 0;     Insects [Other] Other (See Comments)     Red and swelling     Family History   Problem Relation Age of Onset    BRCA 1/2 Brother      Social History     Social History    Marital status: Married     Spouse name: N/A    Number of children: N/A    Years of education: N/A     Occupational History    Former PriPersonal assistantsed to work night shift      Social History Main Topics  Smoking status: Never Smoker    Smokeless tobacco: Never Used    Alcohol use No    Drug use: No    Sexual activity: Not Asked     Other Topics Concern    None     Social History Narrative    2 daughters with neurological issues       Current Outpatient Prescriptions   Medication Sig Dispense Refill    insulin pen needle (BD ULTRA-FINE PEN NEEDLE NANO) 32G X 4 MM Use 1 times a day as instructed with Victoza pens 100 each 3    COUMADIN 4 MG tablet TAKE 1 TABLET BY MOUTH EVERY DAY 30 tablet 5    finasteride (PROSCAR) 5 MG tablet TAKE ONE TABLET BY MOUTH ONCE DAILY FOR USE BY MEN ONLY 90 tablet 3    mirabegron (MYRBETRIQ) 50 MG 24 hr tablet Take 1 tablet (50 mg total) by mouth daily 90 tablet 2    Liraglutide (VICTOZA) 18 MG/3ML injection Inject 1.8 mg into the skin daily 18 mL 4    metFORMIN (GLUCOPHAGE) 1000 MG tablet Take 1 tablet (1,000 mg total) by mouth 2 times daily (with meals) 180 tablet 4    pravastatin (PRAVACHOL) 40 MG tablet Take 1 tablet (40 mg total) by mouth daily 90 tablet 3    memantine (NAMENDA) 10 MG tablet Take 1 Tablet by mouth Twice a Day 180 tablet 3    meloxicam (MOBIC) 7.5 MG tablet Take 1 tablet (7.5 mg total) by mouth daily 90 tablet 3    gabapentin (GABAPENTIN) 300 MG capsule Take 1 capsule (300 mg total) by mouth 2 times daily 180 capsule 3    donepezil (ARICEPT) 10 MG tablet Take 1 tablet (10 mg total) by mouth daily 90 tablet 3     COUMADIN 4 MG tablet Take 1 tablet (4 mg total) by mouth daily 90 tablet 3    blood glucose monitor (FREESTYLE LITE) kit Use as instructed 1 each 0    FREESTYLE LITE test strip To be used twice a day 100 each 4    lancets (ONETOUCH ULTRASOFT) Use   2  times per day as instructed for blood glucose testing. 100 each 3    COUMADIN 2.5 MG tablet TAKE 2 TABLETS BY MOUTH ON MONDAY AND THURSDAY. TAKE 1 AND 1/2 TABLETS BY MOUTH THE REST OF DAYS OR AS DIRECTED 138 tablet 4    Blood Glucose Calibration (OT ULTRA/FASTTK CNTRL SOLN) SOLN As directed 1 each 5    CINNAMON PO Take by mouth      Co-Enzyme Q-10 100 MG CAPS Take by mouth      Multiple Vitamins-Minerals (MULTI VITAMIN/MINERALS PO) Take by mouth      cholecalciferol (VITAMIN D) 1000 UNIT tablet Take 2,000 Units by mouth daily          FISH OIL by Does not apply route daily.      acetaminophen (TYLENOL) 500 MG tablet Take 500 mg by mouth 2 times daily.      meloxicam (MOBIC) 7.5 MG tablet TAKE 1 TABLET BY MOUTH EVERY DAY 30 tablet 3    mirabegron (MYRBETRIQ) 50 MG 24 hr tablet Take 1 tablet (50 mg total) by mouth daily 90 tablet 2    cyanocobalamin (VITAMIN B-12) 1000 MCG tablet Take 1,000 mcg by mouth daily      HYDROcodone-acetaminophen (VICODIN) 5-500 MG per tablet 1-2 po q 6 hours prn pain MDD=6 180 tablet 1     No current facility-administered medications  for this visit.          Physical Exam:  Vitals:    05/18/16 0816   BP: 132/84   Pulse: 76   Resp: 14   Weight: 91.2 kg (201 lb)       Constitutional: Comfortable, no apparent distress, overweight  Eyes:  Equal, round and reactive, conjunctivae clear, lids normal  ENT/mouth: Moist mucous membranes, no oropharyngeal lesions, good dentition, hearing intact.  Cerumen was removed from bilateral ears with the use of an illuminated curette and otoscope.  Resp: Unlabored breathing effort, lungs clear to auscultation bilaterally, no wheezes, no rhonchi  CV: Regular S1/S2, no murmurs, no peripheral  edema  Abd: Abdomen soft, not tender, not distended, bowel sounds present, no hepatosplenomegaly  MSK: No joint tenderness, no inflammation of joints, crepitus of bilateral knees  Skin:  Warm, dry, good turgor, no rashes  Neuro: Speech intact, no motor deficits, no sensory deficits  Psych:  Alert and oriented x3, normal affect and mood      Assessment:  82 y.o.male today presenting for f/u of DM and various other issues        Plan:    1.  DM type II -    - discussed diet and exercise   - con't victoza 1.70m daily and metformin 10071mBID   - Statin: changing to rosuvastatin     Lab Results   Component Value Date    HA1C 7.3 (H) 11/17/2015      - f/u HbA1c   Diabetes metrics:    Last ophthalmology exam: upcoming in June w/Dr. AsMerlene Morse  Last podiatry exam: will give info for podiatry     Lesions on feet:     Checks feet daily/nightly: yes     - counseling on daily foot checks done today    Monofilament test of feet:   feels '@0' .24m26milaterally    Tobacco use: never      2.   Elevated BP's - decent control   - not on meds    3.  Chronic lower back pain - with Paget's, ABI 05/2015 was normal.     - advised to not engage in heavy lifting if possible   - will give info for Dr. MorLynnette Caffey pain medicine   - patient to look back into acupuncture   - con't gabapentin 600m48mS   - rare use of hydrocodone    4.  Pain/pressure in bilateral calves and ankles -    - will change pravastatin to rosuvastatin 10mg83mly    5.  Dementia - per wife, is getting worse - seems stable today   - patient not interested in memory clinic at this time   - con't aricept/namenda   - con't mind exercises, reduce stressors    6.  Protein S -    - con't chronic coumadin    Prevnar today    Follow up: 6 months for H&P    Talesha Ellithorpe Jacklyn Shell Internal [facility]

## 2016-05-18 NOTE — Patient Instructions (Addendum)
1.  For your back pain: Please call Sherri Sear, MD at the [facility] [phone]    2.  Can make an appointment for acupuncture:    - [place] - [phone] [facility] - [phone]. [place], [place], [place] [address]   - [email or web address]    3.  Please have your bloodwork done before our next visit (8 hour fast - 11PM-7AM would be fine)    4.  Please stop the pravastatin - instead, start the rosuvastatin 10mg  daily.      5.  Can call podiatrist: Dr. Andreas Newport [phone], [address], [place]  [address]

## 2016-05-21 ENCOUNTER — Telehealth: Payer: Self-pay | Admitting: Primary Care

## 2016-05-21 NOTE — Telephone Encounter (Signed)
Spoke with patient's wife [name] and was informed Vian needs an [facility] referral to see Dr. Andreas Newport for a diabetic foot exam. Patient is scheduled on June 14,2017. Patient also needs a [facility] referral to see Dr. Gifford Shave for chronic back pain. Referrals will be done and faxed to Dr. Lynnette Caffey and Dr. Andreas Newport.

## 2016-05-21 NOTE — Telephone Encounter (Signed)
Spoke with [name] and informed her that the referral were done and faxed over to Dr. Andreas Newport and Dr. Lynnette Caffey.

## 2016-05-30 ENCOUNTER — Encounter: Payer: Self-pay | Admitting: Gastroenterology

## 2016-05-30 LAB — HM DIABETES FOOT EXAM

## 2016-06-05 ENCOUNTER — Encounter: Payer: Self-pay | Admitting: Primary Care

## 2016-06-18 ENCOUNTER — Telehealth: Payer: Self-pay | Admitting: Primary Care

## 2016-06-18 NOTE — Telephone Encounter (Signed)
Spoke with Jon [name] and reminded him to have INR done, he states he will do this on Wednesday.

## 2016-06-20 ENCOUNTER — Ambulatory Visit: Payer: Self-pay

## 2016-06-20 ENCOUNTER — Other Ambulatory Visit
Admission: RE | Admit: 2016-06-20 | Discharge: 2016-06-20 | Disposition: A | Discharge status: Home / Self Care | Payer: Self-pay | Source: Ambulatory Visit | Attending: Primary Care | Admitting: Primary Care

## 2016-06-20 DIAGNOSIS — Z1321 Encounter for screening for nutritional disorder: Secondary | ICD-10-CM

## 2016-06-20 DIAGNOSIS — D6859 Other primary thrombophilia: Secondary | ICD-10-CM

## 2016-06-20 DIAGNOSIS — G8929 Other chronic pain: Secondary | ICD-10-CM

## 2016-06-20 DIAGNOSIS — M791 Myalgia, unspecified site: Secondary | ICD-10-CM

## 2016-06-20 DIAGNOSIS — I10 Essential (primary) hypertension: Secondary | ICD-10-CM

## 2016-06-20 DIAGNOSIS — Z0000 Encounter for general adult medical examination without abnormal findings: Secondary | ICD-10-CM

## 2016-06-20 DIAGNOSIS — E8809 Other disorders of plasma-protein metabolism, not elsewhere classified: Secondary | ICD-10-CM

## 2016-06-20 DIAGNOSIS — E119 Type 2 diabetes mellitus without complications: Secondary | ICD-10-CM

## 2016-06-20 DIAGNOSIS — M545 Low back pain, unspecified: Secondary | ICD-10-CM

## 2016-06-20 DIAGNOSIS — I82409 Acute embolism and thrombosis of unspecified deep veins of unspecified lower extremity: Secondary | ICD-10-CM

## 2016-06-20 LAB — VITAMIN B12: Vitamin B12: 407 pg/mL (ref 211–946)

## 2016-06-20 LAB — CBC AND DIFFERENTIAL
Baso # K/uL: 0.1 10*3/uL (ref 0.0–0.1)
Basophil %: 0.8 %
Eos # K/uL: 0.4 10*3/uL (ref 0.0–0.5)
Eosinophil %: 7 %
Hematocrit: 40 % (ref 40–51)
Hemoglobin: 13 g/dL — ABNORMAL LOW (ref 13.7–17.5)
IMM Granulocytes #: 0 10*3/uL (ref 0.0–0.1)
IMM Granulocytes: 0.2 %
Lymph # K/uL: 1.9 10*3/uL (ref 1.3–3.6)
Lymphocyte %: 29.5 %
MCH: 31 pg/cell (ref 26–32)
MCHC: 33 g/dL (ref 32–37)
MCV: 96 fL — ABNORMAL HIGH (ref 79–92)
Mono # K/uL: 0.6 10*3/uL (ref 0.3–0.8)
Monocyte %: 8.7 %
Neut # K/uL: 3.4 10*3/uL (ref 1.8–5.4)
Nucl RBC # K/uL: 0 10*3/uL (ref 0.0–0.0)
Nucl RBC %: 0 /100 WBC (ref 0.0–0.2)
Platelets: 104 10*3/uL — ABNORMAL LOW (ref 150–330)
RBC: 4.2 MIL/uL — ABNORMAL LOW (ref 4.6–6.1)
RDW: 13.2 % (ref 11.6–14.4)
Seg Neut %: 53.8 %
WBC: 6.3 10*3/uL (ref 4.2–9.1)

## 2016-06-20 LAB — LIPID PANEL
Chol/HDL Ratio: 2.4
Cholesterol: 113 mg/dL
HDL: 48 mg/dL
LDL Calculated: 46 mg/dL
Non HDL Cholesterol: 65 mg/dL
Triglycerides: 93 mg/dL

## 2016-06-20 LAB — PROTIME-INR
INR: 1.7 — ABNORMAL HIGH (ref 0.9–1.1)
Protime: 19.7 s — ABNORMAL HIGH (ref 10.0–12.9)

## 2016-06-20 LAB — COMPREHENSIVE METABOLIC PANEL
ALT: 27 U/L (ref 0–50)
AST: 28 U/L (ref 0–50)
Albumin: 3.9 g/dL (ref 3.5–5.2)
Alk Phos: 67 U/L (ref 40–130)
Anion Gap: 16 (ref 7–16)
Bilirubin,Total: 0.6 mg/dL (ref 0.0–1.2)
CO2: 22 mmol/L (ref 20–28)
Calcium: 9 mg/dL (ref 8.6–10.2)
Chloride: 104 mmol/L (ref 96–108)
Creatinine: 0.99 mg/dL (ref 0.67–1.17)
GFR,Black: 83 *
GFR,Caucasian: 71 *
Glucose: 175 mg/dL — ABNORMAL HIGH (ref 60–99)
Lab: 18 mg/dL (ref 6–20)
Potassium: 4.7 mmol/L (ref 3.3–5.1)
Sodium: 142 mmol/L (ref 133–145)
Total Protein: 6.8 g/dL (ref 6.3–7.7)

## 2016-06-20 LAB — T4, FREE: Free T4: 1 ng/dL (ref 0.9–1.7)

## 2016-06-20 LAB — TSH: TSH: 4.03 u[IU]/mL (ref 0.27–4.20)

## 2016-06-20 LAB — HEMOGLOBIN A1C: Hemoglobin A1C: 8 % — ABNORMAL HIGH (ref 4.0–6.0)

## 2016-06-20 NOTE — Telephone Encounter (Signed)
See flowsheet, INR done and addressed today.

## 2016-06-20 NOTE — Patient Instructions (Signed)
Left message on personal voice mail to take 6 mg tonight only and then 2 mg on Tuesday's and 4 mg rest of week and recheck again in 5 days.

## 2016-06-20 NOTE — Progress Notes (Signed)
ORDER:  inr  not at goal level low  Adjust warfarin --confirm current dose--- 6 mg tonight then change to 2 mg every Thursday 4 mg other 6 days of week   Repeat in 5 days

## 2016-06-21 LAB — MICROALBUMIN, URINE, RANDOM
Creatinine,UR: 211 mg/dL (ref 20–300)
Microalb/Creat Ratio: 6.4 mg MA/g CR (ref 0.0–29.9)
Microalbumin,UR: 1.34 mg/dL

## 2016-06-25 ENCOUNTER — Other Ambulatory Visit
Admission: RE | Admit: 2016-06-25 | Discharge: 2016-06-25 | Disposition: A | Discharge status: Home / Self Care | Payer: Self-pay | Source: Ambulatory Visit | Attending: Primary Care | Admitting: Primary Care

## 2016-06-25 ENCOUNTER — Ambulatory Visit: Payer: Self-pay

## 2016-06-25 DIAGNOSIS — E8809 Other disorders of plasma-protein metabolism, not elsewhere classified: Secondary | ICD-10-CM

## 2016-06-25 DIAGNOSIS — I82409 Acute embolism and thrombosis of unspecified deep veins of unspecified lower extremity: Secondary | ICD-10-CM

## 2016-06-25 DIAGNOSIS — D6859 Other primary thrombophilia: Secondary | ICD-10-CM

## 2016-06-25 LAB — PROTIME-INR
INR: 2.1 — ABNORMAL HIGH (ref 0.9–1.1)
Protime: 24.6 s — ABNORMAL HIGH (ref 10.0–12.9)

## 2016-06-25 LAB — VITAMIN D
25-OH VIT D2: <4 ng/mL
25-OH VIT D3: 40 ng/mL
25-OH Vit Total: 40 ng/mL (ref 30–60)

## 2016-06-25 NOTE — Patient Instructions (Signed)
Left message on personal voice mail with results and to continue  2 mg on Tuesday's and 4 mg rest of week and recheck again in two weeks.

## 2016-07-04 ENCOUNTER — Telehealth: Payer: Self-pay | Admitting: Primary Care

## 2016-07-04 NOTE — Telephone Encounter (Signed)
Jon [name] called to let you know that since [name]'s Crestor was reduced his leg cramps are now minimal and he is no longer taking his gabapentin. Jon [name] just wanted to give you a heads up. Thanks

## 2016-07-04 NOTE — Addendum Note (Signed)
Addended by: Lenard Lance on: 07/04/2016 01:40 PM     Modules accepted: Orders

## 2016-07-04 NOTE — Telephone Encounter (Signed)
Excellent.  Please update MAR.

## 2016-07-09 ENCOUNTER — Other Ambulatory Visit
Admission: RE | Admit: 2016-07-09 | Discharge: 2016-07-09 | Disposition: A | Discharge status: Home / Self Care | Payer: Self-pay | Source: Ambulatory Visit | Attending: Primary Care | Admitting: Primary Care

## 2016-07-09 ENCOUNTER — Ambulatory Visit: Payer: Self-pay

## 2016-07-09 DIAGNOSIS — I82409 Acute embolism and thrombosis of unspecified deep veins of unspecified lower extremity: Secondary | ICD-10-CM

## 2016-07-09 DIAGNOSIS — E8809 Other disorders of plasma-protein metabolism, not elsewhere classified: Secondary | ICD-10-CM

## 2016-07-09 LAB — PROTIME-INR
INR: 2.7 — ABNORMAL HIGH (ref 0.9–1.1)
Protime: 30.9 s — ABNORMAL HIGH (ref 10.0–12.9)

## 2016-07-09 NOTE — Patient Instructions (Addendum)
Left message on personal voice mail with results of INR and to continue 2 mg's on Tuesday's and 4 mg rest of week and recheck again on August 24th.

## 2016-07-30 ENCOUNTER — Other Ambulatory Visit: Payer: Self-pay | Admitting: Primary Care

## 2016-07-30 DIAGNOSIS — E119 Type 2 diabetes mellitus without complications: Secondary | ICD-10-CM

## 2016-07-31 MED ORDER — MIRABEGRON ER 50 MG PO TB24 *I*
50.0000 mg | ORAL_TABLET | Freq: Every day | ORAL | 2 refills | Status: DC
Start: 2016-07-31 — End: 2017-09-16

## 2016-07-31 NOTE — Telephone Encounter (Signed)
Lab Results   Component Value Date    HA1C 8.0 (H) 06/20/2016    HA1C 8.7 (H) 07/06/2015    HA1C 8.8 (H) 05/10/2015    PA1C 7.3 (H) 11/17/2015    PA1C 8.7 (H) 09/05/2015    MALBR 1.34 06/20/2016    CREAT 0.99 06/20/2016    LDLC 46 06/20/2016   next appt 11/19/16  Future labs entered

## 2016-08-06 ENCOUNTER — Encounter: Payer: Self-pay | Admitting: Gastroenterology

## 2016-08-06 LAB — HM DIABETES EYE EXAM

## 2016-08-08 ENCOUNTER — Ambulatory Visit: Payer: Self-pay

## 2016-08-08 ENCOUNTER — Other Ambulatory Visit
Admission: RE | Admit: 2016-08-08 | Discharge: 2016-08-08 | Disposition: A | Discharge status: Home / Self Care | Payer: Self-pay | Source: Ambulatory Visit | Attending: Primary Care | Admitting: Primary Care

## 2016-08-08 DIAGNOSIS — I82409 Acute embolism and thrombosis of unspecified deep veins of unspecified lower extremity: Secondary | ICD-10-CM

## 2016-08-08 DIAGNOSIS — E8809 Other disorders of plasma-protein metabolism, not elsewhere classified: Secondary | ICD-10-CM

## 2016-08-08 LAB — PROTIME-INR
INR: 4.1 — ABNORMAL HIGH (ref 0.9–1.1)
Protime: 48.9 s — ABNORMAL HIGH (ref 10.0–12.9)

## 2016-08-08 NOTE — Patient Instructions (Signed)
I spoke with Jon [name] and he hasn't done anything different.  He will go to 2 mg on Mon and Thurs and 4 mg rest of week and will recheck again in two weeks.

## 2016-08-13 ENCOUNTER — Encounter: Payer: Self-pay | Admitting: Primary Care

## 2016-08-22 ENCOUNTER — Other Ambulatory Visit
Admission: RE | Admit: 2016-08-22 | Discharge: 2016-08-22 | Disposition: A | Discharge status: Home / Self Care | Payer: Self-pay | Source: Ambulatory Visit | Attending: Primary Care | Admitting: Primary Care

## 2016-08-22 ENCOUNTER — Ambulatory Visit: Payer: Self-pay

## 2016-08-22 DIAGNOSIS — E8809 Other disorders of plasma-protein metabolism, not elsewhere classified: Secondary | ICD-10-CM

## 2016-08-22 DIAGNOSIS — I82409 Acute embolism and thrombosis of unspecified deep veins of unspecified lower extremity: Secondary | ICD-10-CM

## 2016-08-22 DIAGNOSIS — E119 Type 2 diabetes mellitus without complications: Secondary | ICD-10-CM

## 2016-08-22 LAB — PROTIME-INR
INR: 3.2 — ABNORMAL HIGH (ref 0.9–1.1)
Protime: 37.3 s — ABNORMAL HIGH (ref 10.0–12.9)

## 2016-08-22 NOTE — Patient Instructions (Signed)
I spoke with [name] and [name] and to change the warfarin to 2 mg on Mon, Wed, Fri and 4 mg rest of week.  Recheck again in 2 weeks.

## 2016-08-27 ENCOUNTER — Other Ambulatory Visit: Payer: Self-pay | Admitting: Primary Care

## 2016-09-03 ENCOUNTER — Other Ambulatory Visit: Payer: Self-pay | Admitting: Primary Care

## 2016-09-05 ENCOUNTER — Ambulatory Visit: Payer: Self-pay

## 2016-09-05 ENCOUNTER — Other Ambulatory Visit
Admission: RE | Admit: 2016-09-05 | Discharge: 2016-09-05 | Disposition: A | Discharge status: Home / Self Care | Payer: Self-pay | Source: Ambulatory Visit | Attending: Primary Care | Admitting: Primary Care

## 2016-09-05 DIAGNOSIS — E8809 Other disorders of plasma-protein metabolism, not elsewhere classified: Secondary | ICD-10-CM

## 2016-09-05 DIAGNOSIS — I82409 Acute embolism and thrombosis of unspecified deep veins of unspecified lower extremity: Secondary | ICD-10-CM

## 2016-09-05 LAB — PROTIME-INR
INR: 2.4 — ABNORMAL HIGH (ref 0.9–1.1)
Protime: 28.4 s — ABNORMAL HIGH (ref 10.0–12.9)

## 2016-09-05 NOTE — Patient Instructions (Signed)
I spoke with [name] and to continue 2 mg Mon, Wed, Fri and 4 mg rest of week and recheck again in one month

## 2016-09-06 LAB — MICROALBUMIN, URINE, RANDOM
Creatinine,UR: 176 mg/dL (ref 20–300)
Microalb/Creat Ratio: 4.6 mg MA/g CR (ref 0.0–29.9)
Microalbumin,UR: 0.81 mg/dL

## 2016-09-07 ENCOUNTER — Other Ambulatory Visit: Payer: Self-pay | Admitting: Endocrinology

## 2016-09-07 MED ORDER — LIRAGLUTIDE 18 MG/3ML SC SOPN *I*
1.8000 mg | PEN_INJECTOR | Freq: Every day | SUBCUTANEOUS | 4 refills | Status: DC
Start: 2016-09-07 — End: 2017-01-25

## 2016-09-07 NOTE — Telephone Encounter (Signed)
Jon [name] calling to request prescription(s)   Liraglutide (VICTOZA) 18 MG/3ML injection to be sent to [facility].    Is patient out of the medication? yes just enough for today.  Does the patient have questions regarding the medication for the nurse? no    Patient can be reached if necessary at [phone].

## 2016-09-10 ENCOUNTER — Encounter: Payer: Self-pay | Admitting: Gastroenterology

## 2016-09-10 LAB — HM DIABETES EYE EXAM

## 2016-09-11 ENCOUNTER — Encounter: Payer: Self-pay | Admitting: Primary Care

## 2016-09-17 ENCOUNTER — Ambulatory Visit: Payer: Self-pay | Admitting: Urology

## 2016-10-05 ENCOUNTER — Other Ambulatory Visit
Admission: RE | Admit: 2016-10-05 | Discharge: 2016-10-05 | Disposition: A | Discharge status: Home / Self Care | Payer: Self-pay | Source: Ambulatory Visit | Attending: Primary Care | Admitting: Primary Care

## 2016-10-05 DIAGNOSIS — I82409 Acute embolism and thrombosis of unspecified deep veins of unspecified lower extremity: Secondary | ICD-10-CM

## 2016-10-05 LAB — PROTIME-INR
INR: 2.5 — ABNORMAL HIGH (ref 0.9–1.1)
Protime: 29.3 s — ABNORMAL HIGH (ref 10.0–12.9)

## 2016-10-08 ENCOUNTER — Ambulatory Visit: Payer: Self-pay

## 2016-10-08 NOTE — Progress Notes (Signed)
Left message on personal voice mail with results of INR and to continue 2 mg Mon, Wed, Fri and 4 mg rest of week and recheck again in one month.

## 2016-11-04 ENCOUNTER — Other Ambulatory Visit: Payer: Self-pay | Admitting: Primary Care

## 2016-11-06 ENCOUNTER — Telehealth: Payer: Self-pay | Admitting: Primary Care

## 2016-11-06 NOTE — Telephone Encounter (Signed)
I spoke with [name] and she states that he has been short of breath for a couple of days, no fevers or chills, [name] asked for an appointment for tomorrow, 11:40 tomorrow appt was made

## 2016-11-06 NOTE — Telephone Encounter (Signed)
I was informed patient has shortness of breath. Please advise. Thank you.

## 2016-11-07 ENCOUNTER — Ambulatory Visit: Payer: Self-pay | Admitting: Primary Care

## 2016-11-07 ENCOUNTER — Ambulatory Visit: Payer: Self-pay

## 2016-11-07 ENCOUNTER — Other Ambulatory Visit
Admission: RE | Admit: 2016-11-07 | Discharge: 2016-11-07 | Disposition: A | Discharge status: Home / Self Care | Payer: Self-pay | Source: Ambulatory Visit | Attending: Primary Care | Admitting: Primary Care

## 2016-11-07 ENCOUNTER — Encounter: Payer: Self-pay | Admitting: Primary Care

## 2016-11-07 VITALS — BP 124/68 | HR 52 | Wt 200.0 lb

## 2016-11-07 DIAGNOSIS — R0600 Dyspnea, unspecified: Secondary | ICD-10-CM

## 2016-11-07 DIAGNOSIS — I82409 Acute embolism and thrombosis of unspecified deep veins of unspecified lower extremity: Secondary | ICD-10-CM

## 2016-11-07 DIAGNOSIS — R0602 Shortness of breath: Secondary | ICD-10-CM

## 2016-11-07 DIAGNOSIS — R0609 Other forms of dyspnea: Secondary | ICD-10-CM

## 2016-11-07 LAB — PROTIME-INR
INR: 3.1 — ABNORMAL HIGH (ref 0.9–1.1)
Protime: 36.2 s — ABNORMAL HIGH (ref 10.0–12.9)

## 2016-11-07 LAB — LIPID PANEL
Chol/HDL Ratio: 2.4
Cholesterol: 117 mg/dL
HDL: 49 mg/dL
LDL Calculated: 50 mg/dL
Non HDL Cholesterol: 68 mg/dL
Triglycerides: 89 mg/dL

## 2016-11-07 LAB — CBC AND DIFFERENTIAL
Baso # K/uL: 0 10*3/uL (ref 0.0–0.1)
Basophil %: 0.6 %
Eos # K/uL: 0.2 10*3/uL (ref 0.0–0.5)
Eosinophil %: 3.1 %
Hematocrit: 41 % (ref 40–51)
Hemoglobin: 13.3 g/dL — ABNORMAL LOW (ref 13.7–17.5)
IMM Granulocytes #: 0 10*3/uL (ref 0.0–0.1)
IMM Granulocytes: 0.2 %
Lymph # K/uL: 1.6 10*3/uL (ref 1.3–3.6)
Lymphocyte %: 25.3 %
MCH: 31 pg/cell (ref 26–32)
MCHC: 33 g/dL (ref 32–37)
MCV: 95 fL — ABNORMAL HIGH (ref 79–92)
Mono # K/uL: 0.5 10*3/uL (ref 0.3–0.8)
Monocyte %: 8.4 %
Neut # K/uL: 3.9 10*3/uL (ref 1.8–5.4)
Nucl RBC # K/uL: 0 10*3/uL (ref 0.0–0.0)
Nucl RBC %: 0 /100 WBC (ref 0.0–0.2)
Platelets: 111 10*3/uL — ABNORMAL LOW (ref 150–330)
RBC: 4.3 MIL/uL — ABNORMAL LOW (ref 4.6–6.1)
RDW: 13.9 % (ref 11.6–14.4)
Seg Neut %: 62.4 %
WBC: 6.2 10*3/uL (ref 4.2–9.1)

## 2016-11-07 LAB — PCMH FALL RISK PLAN

## 2016-11-07 LAB — PCMH DEPRESSION ASSESSMENT

## 2016-11-07 LAB — PCMH FALL RISK ASSESSMENT

## 2016-11-07 MED ORDER — IPRATROPIUM BROMIDE 0.03 % NA SOLN *I*
2.0000 | Freq: Three times a day (TID) | NASAL | 1 refills | Status: DC
Start: 2016-11-07 — End: 2019-07-10

## 2016-11-07 NOTE — Telephone Encounter (Signed)
-----   Message from Edythe Clarity, MD sent at 11/07/2016  1:12 PM EST -----  See attached note.

## 2016-11-07 NOTE — Patient Instructions (Addendum)
1.  Please have your chest x-ray done    2.  For your nasal discharge: can start atrovent 0.03% nasal spray - 2 sprays in each nostril three times a day as needed

## 2016-11-07 NOTE — Telephone Encounter (Signed)
Note      Please let him know that his CXR showed some heart enlargement and mild fluid around the lungs.  Will proceed w/TTE - order placed.  Will place bloodwork as well to be done before our meeting next week. Thank you.        I spoke with [name] and reviewed the results and that they will get a call Monday or Tuesday next week in regards to the appointment for the TTE.

## 2016-11-07 NOTE — Patient Instructions (Signed)
I spoke with [name] and reviewed INR results with her and to continue with 2 mg Mon, Wed, Fri and 4 mg rest of week and recheck again in one week.

## 2016-11-07 NOTE — Progress Notes (Signed)
CC: "short of breath"    HPI:  Jon [name] is a 82 y.o.male here for eval of various issues      Per patient:    1.  SOB/DOE - for the past week has been having more SOB.  Yesterday couldn't make a mile around the mall.  Also with some nausea.  Reports not being able to sleep for the past few days, but dozes during the day - has been having a lot of stress lately (taking care of granddaughter, aging dog, etc).          Review of systems:   No fever or chills  (+) insomnia  No headache  No sore throat  No chest pain  (+) shortness of breath and DOE  (+) wheezing  No cough  No change of appetite  (+) nausea yesterday (after walking)  No vomiting  No abdominal pain  No stool changes  No urinary symptoms  (+) dizziness or lightheadedness periodically (patient can't recall this)  No depression  No skin rashes      Past Medical History:   Diagnosis Date    Arthritis     BPH (benign prostatic hypertrophy)     Dementia     mild    DM type 2 (diabetes mellitus, type 2)     Paget's disease of bone     Protein S deficiency     On chronic anticoagulation    Sinus bradycardia     Varicella      Past Surgical History:   Procedure Laterality Date    CHOLECYSTECTOMY      JOINT REPLACEMENT      KNEE REPLACEMENT      SKIN GRAFT      TONSILLECTOMY         Allergies:   Allergies   Allergen Reactions    Bee Venom Other (See Comments)     Red and swelling    No Known Latex Allergy      Created by Conversion - 0;     Insects [Other] Other (See Comments)     Red and swelling     Family History   Problem Relation Age of Onset    BRCA 1/2 Brother      Social History     Social History    Marital status: Married     Spouse name: N/A    Number of children: N/A    Years of education: N/A     Occupational History    Former Personal assistant, used to work night shift      Social History Main Topics    Smoking status: Never Smoker    Smokeless tobacco: Never Used    Alcohol use No    Drug use: No    Sexual activity: Not Asked      Other Topics Concern    None     Social History Narrative    2 daughters with neurological issues       Current Outpatient Prescriptions   Medication Sig Dispense Refill    Liraglutide (VICTOZA) 18 MG/3ML injection Inject 1.8 mg into the skin daily 18 mL 4    memantine (NAMENDA) 10 MG tablet TAKE 1 TABLET BY MOUTH TWO TIMES DAILY 180 tablet 3    metFORMIN (GLUCOPHAGE) 1000 MG tablet TAKE 1 TABLET BY MOUTH TWO TIMES DAILY WITH FOOD 180 tablet 1    FREESTYLE LITE test strip TO BE USED TWO TIMES DAILY TO TEST BLOOD SUGAR 100 strip  4    mirabegron (MYRBETRIQ) 50 MG 24 hr tablet Take 1 tablet (50 mg total) by mouth daily 90 tablet 2    rosuvastatin (CRESTOR) 10 MG tablet Take 1 tablet (10 mg total) by mouth daily (with dinner) 30 tablet 5    insulin pen needle (BD ULTRA-FINE PEN NEEDLE NANO) 32G X 4 MM Use 1 times a day as instructed with Victoza pens 100 each 3    finasteride (PROSCAR) 5 MG tablet TAKE ONE TABLET BY MOUTH ONCE DAILY FOR USE BY MEN ONLY 90 tablet 3    meloxicam (MOBIC) 7.5 MG tablet Take 1 tablet (7.5 mg total) by mouth daily 90 tablet 3    donepezil (ARICEPT) 10 MG tablet Take 1 tablet (10 mg total) by mouth daily 90 tablet 3    COUMADIN 4 MG tablet Take 1 tablet (4 mg total) by mouth daily 90 tablet 3    blood glucose monitor (FREESTYLE LITE) kit Use as instructed 1 each 0    lancets (ONETOUCH ULTRASOFT) Use   2  times per day as instructed for blood glucose testing. 100 each 3    COUMADIN 2.5 MG tablet TAKE 2 TABLETS BY MOUTH ON MONDAY AND THURSDAY. TAKE 1 AND 1/2 TABLETS BY MOUTH THE REST OF DAYS OR AS DIRECTED 138 tablet 4    Blood Glucose Calibration (OT ULTRA/FASTTK CNTRL SOLN) SOLN As directed 1 each 5    CINNAMON PO Take by mouth      Co-Enzyme Q-10 100 MG CAPS Take by mouth      Multiple Vitamins-Minerals (MULTI VITAMIN/MINERALS PO) Take by mouth      HYDROcodone-acetaminophen (VICODIN) 5-500 MG per tablet 1-2 po q 6 hours prn pain MDD=6 180 tablet 1    cholecalciferol  (VITAMIN D) 1000 UNIT tablet Take 2,000 Units by mouth daily          FISH OIL by Does not apply route daily.      acetaminophen (TYLENOL) 500 MG tablet Take 500 mg by mouth 2 times daily.      cyanocobalamin (VITAMIN B-12) 1000 MCG tablet Take 1,000 mcg by mouth daily       No current facility-administered medications for this visit.          Physical Exam:  Vitals:    11/07/16 1057   BP: 124/68   Pulse: 52   Weight: 90.7 kg (200 lb)       Constitutional: Comfortable, no apparent distress, obese  Eyes:  Equal, round and reactive, conjunctivae clear, lids normal  ENT/mouth: Moist mucous membranes, no oropharyngeal lesions, good dentition, hearing intact.  Cerumen removed from bilateral ears with illuminated curette and otoscope  Resp: Unlabored breathing effort, lungs clear to auscultation bilaterally, no wheezes, no rhonchi  CV: Regular S1/S2, no murmurs, bilateral peripheral edema  Abd: Abdomen soft, not tender, not distended, bowel sounds present, no hepatosplenomegaly  MSK: No joint tenderness, no inflammation of joints  Skin:  Warm, dry, good turgor, no rashes  Neuro: Speech intact, no motor deficits, no sensory deficits  Psych:  Alert and oriented x3, normal affect and mood      Assessment:  82 y.o.male today presenting for evaluation of SOB/DOE        Plan:    1.  SOB/DOE - etiology not clear, but INR therapeutic and HR a bit low   - will check EKG now   - will check CXR   - if above are normal - then we will pursue PFT/TTE  2.  Nasal dripping -    - will give Rx for atrovent nasal spray        Follow up: 1 week    Jacklyn Shell, MD  Internal Medicine

## 2016-11-07 NOTE — Telephone Encounter (Signed)
Please let him know that his CXR showed some heart enlargement and mild fluid around the lungs.  Will proceed w/TTE - order placed.  Will place bloodwork as well to be done before our meeting next week. Thank you.

## 2016-11-09 ENCOUNTER — Other Ambulatory Visit: Payer: Self-pay | Admitting: Primary Care

## 2016-11-09 LAB — HEMOGLOBIN A1C: Hemoglobin A1C: 7 % — ABNORMAL HIGH (ref 4.0–6.0)

## 2016-11-12 ENCOUNTER — Telehealth: Payer: Self-pay | Admitting: Primary Care

## 2016-11-12 ENCOUNTER — Other Ambulatory Visit: Payer: Self-pay

## 2016-11-12 ENCOUNTER — Telehealth: Payer: Self-pay

## 2016-11-12 DIAGNOSIS — I5021 Acute systolic (congestive) heart failure: Secondary | ICD-10-CM | POA: Insufficient documentation

## 2016-11-12 DIAGNOSIS — I429 Cardiomyopathy, unspecified: Secondary | ICD-10-CM | POA: Insufficient documentation

## 2016-11-12 DIAGNOSIS — Z7901 Long term (current) use of anticoagulants: Secondary | ICD-10-CM | POA: Insufficient documentation

## 2016-11-12 DIAGNOSIS — F0150 Vascular dementia without behavioral disturbance: Secondary | ICD-10-CM | POA: Insufficient documentation

## 2016-11-12 DIAGNOSIS — D6859 Other primary thrombophilia: Secondary | ICD-10-CM | POA: Insufficient documentation

## 2016-11-12 LAB — CBC AND DIFFERENTIAL
Baso # K/uL: 0 10*3/uL (ref 0.0–0.2)
Basophil %: 1 % (ref 0–3)
Eos # K/uL: 0.2 10*3/uL (ref 0.0–0.6)
Eosinophil %: 3 % (ref 0–5)
Hematocrit: 38 % — ABNORMAL LOW (ref 40–52)
Hemoglobin: 12.6 g/dL — ABNORMAL LOW (ref 13.0–18.0)
Lymph # K/uL: 1.2 10*3/uL (ref 1.0–4.8)
Lymphocyte %: 19 % (ref 15–45)
MCH: 30.6 pg (ref 26.0–34.0)
MCHC: 33 g/dL (ref 32.0–37.5)
MCV: 93 fL (ref 80–100)
Mono # K/uL: 0.4 10*3/uL (ref 0.1–1.0)
Monocyte %: 7 % (ref 0–15)
Neut # K/uL: 4.6 10*3/uL (ref 1.8–8.0)
Platelets: 102 10*3/uL — ABNORMAL LOW (ref 150–450)
RBC: 4.12 10*6/uL — ABNORMAL LOW (ref 4.40–6.20)
RDW: 13.7 % (ref 0.0–15.2)
Seg Neut %: 71 % (ref 45–75)
WBC: 6.4 10*3/uL (ref 4.0–11.0)

## 2016-11-12 LAB — ESTIMATED GFR
GFR,Black: >60 mL/min
GFR,Caucasian: 53 mL/min — AB

## 2016-11-12 LAB — TROPONIN T: Troponin T: 0.04 ng/mL (ref 0.00–0.09)

## 2016-11-12 LAB — BASIC METABOLIC PANEL
Anion Gap: 8 mEq/L (ref 4–16)
CO2: 24 mEq/L (ref 20–31)
Calcium: 9.4 mg/dL (ref 8.5–10.4)
Chloride: 108 mEq/L (ref 98–108)
Creatinine: 1.3 mg/dL — ABNORMAL HIGH (ref 0.7–1.2)
Glucose: 149 mg/dL — ABNORMAL HIGH (ref 65–100)
Lab: 17 mg/dL (ref 8–20)
Potassium: 4.1 mEq/L (ref 3.5–5.1)
Sodium: 140 mEq/L (ref 135–145)
UN/Creat Ratio: 13.1 (ref 12.0–20.0)

## 2016-11-12 LAB — PROTIME-INR
INR: 3.5 — ABNORMAL HIGH (ref 0.9–1.1)
Protime: 41.1 s — ABNORMAL HIGH (ref 10.2–12.9)

## 2016-11-12 LAB — APTT: aPTT: 42.6 s — ABNORMAL HIGH (ref 25.1–36.5)

## 2016-11-12 NOTE — Telephone Encounter (Signed)
I spoke with [name] and she would like to know if [name] is in CHF and he isn't feeling any better and is concerned about this and would like to know what to do, please advise

## 2016-11-12 NOTE — Telephone Encounter (Signed)
Left message on personal voice mail with reply from Dr. [place] Skene and to have blood work done early enough so that the results can be available when they come in

## 2016-11-12 NOTE — Telephone Encounter (Signed)
Called joan back to let her know that the next available and the soonest I could book him for an echo is for 11/20/16 @ 11 AM at [address] It is a little ways out, with that being said he currently does not have a cardiologist. She did want to speak with rosanne so I transferred her over, thanks

## 2016-11-12 NOTE — Telephone Encounter (Signed)
Admitted to [facility] with SOB   Notification only

## 2016-11-12 NOTE — Telephone Encounter (Signed)
Some of the signs are pointing to CHF - but that's why I ordered an ECHO/bloodwork to confirm this.    If we cannot get him to an ECHO in a timely fashion, I may need to send him to cardiology urgently @[facility] .  Will evaluate him tomorrow and decide, based on his symptoms.     Please remind him to have bloodwork done prior to seeing [place].  That is important.

## 2016-11-12 NOTE — Telephone Encounter (Signed)
No Auth Req

## 2016-11-13 ENCOUNTER — Ambulatory Visit: Payer: Self-pay | Admitting: Primary Care

## 2016-11-13 LAB — CBC
Hematocrit: 37 % — ABNORMAL LOW (ref 40–52)
Hemoglobin: 12.2 g/dL — ABNORMAL LOW (ref 13.0–18.0)
MCH: 30.5 pg (ref 26.0–34.0)
MCHC: 32.6 g/dL (ref 32.0–37.5)
MCV: 94 fL (ref 80–100)
Platelets: 106 10*3/uL — ABNORMAL LOW (ref 150–450)
RBC: 4 10*6/uL — ABNORMAL LOW (ref 4.40–6.20)
RDW: 13.6 % (ref 0.0–15.2)
WBC: 7.2 10*3/uL (ref 4.0–11.0)

## 2016-11-13 LAB — POCT GLUCOSE
Glucose POCT: 132 mg/dL — ABNORMAL HIGH (ref 65–100)
Glucose POCT: 169 mg/dL — ABNORMAL HIGH (ref 65–100)
Glucose POCT: 160 mg/dL — ABNORMAL HIGH (ref 65–100)
Glucose POCT: 122 mg/dL — ABNORMAL HIGH (ref 65–100)

## 2016-11-13 LAB — BASIC METABOLIC PANEL
Anion Gap: 10 mEq/L (ref 4–16)
CO2: 26 mEq/L (ref 20–31)
Calcium: 9.5 mg/dL (ref 8.5–10.4)
Chloride: 105 mEq/L (ref 98–108)
Creatinine: 1.2 mg/dL (ref 0.7–1.2)
Glucose: 164 mg/dL — ABNORMAL HIGH (ref 65–100)
Lab: 19 mg/dL (ref 8–20)
Potassium: 4 mEq/L (ref 3.5–5.1)
Sodium: 141 mEq/L (ref 135–145)
UN/Creat Ratio: 15.8 (ref 12.0–20.0)

## 2016-11-13 LAB — MAGNESIUM: Magnesium: 1.8 mg/dL (ref 1.5–2.4)

## 2016-11-13 LAB — ESTIMATED GFR
GFR,Black: >60 mL/min
GFR,Caucasian: 58 mL/min — AB

## 2016-11-13 LAB — TROPONIN T: Troponin T: 0.03 ng/mL (ref 0.00–0.09)

## 2016-11-13 LAB — TSH: TSH: 4.63 u[IU]/mL (ref 0.55–4.78)

## 2016-11-14 LAB — CBC AND DIFFERENTIAL
Baso # K/uL: 0.1 10*3/uL (ref 0.0–0.2)
Basophil %: 1 % (ref 0–3)
Eos # K/uL: 0.3 10*3/uL (ref 0.0–0.6)
Eosinophil %: 3 % (ref 0–5)
Hematocrit: 39 % — ABNORMAL LOW (ref 40–52)
Hemoglobin: 12.7 g/dL — ABNORMAL LOW (ref 13.0–18.0)
Lymph # K/uL: 2 10*3/uL (ref 1.0–4.8)
Lymphocyte %: 28 % (ref 15–45)
MCH: 30.2 pg (ref 26.0–34.0)
MCHC: 32.8 g/dL (ref 32.0–37.5)
MCV: 92 fL (ref 80–100)
Mono # K/uL: 0.7 10*3/uL (ref 0.1–1.0)
Monocyte %: 10 % (ref 0–15)
Neut # K/uL: 4.2 10*3/uL (ref 1.8–8.0)
Platelets: 113 10*3/uL — ABNORMAL LOW (ref 150–450)
RBC: 4.21 10*6/uL — ABNORMAL LOW (ref 4.40–6.20)
RDW: 13.8 % (ref 0.0–15.2)
Seg Neut %: 58 % (ref 45–75)
WBC: 7.3 10*3/uL (ref 4.0–11.0)

## 2016-11-14 LAB — BASIC METABOLIC PANEL
Anion Gap: 12 mEq/L (ref 4–16)
CO2: 26 mEq/L (ref 20–31)
Calcium: 9.5 mg/dL (ref 8.5–10.4)
Chloride: 102 mEq/L (ref 98–108)
Creatinine: 1.2 mg/dL (ref 0.7–1.2)
Glucose: 119 mg/dL — ABNORMAL HIGH (ref 65–100)
Lab: 28 mg/dL — ABNORMAL HIGH (ref 8–20)
Potassium: 3.6 mEq/L (ref 3.5–5.1)
Sodium: 140 mEq/L (ref 135–145)
UN/Creat Ratio: 23.3 — ABNORMAL HIGH (ref 12.0–20.0)

## 2016-11-14 LAB — PROTIME-INR
INR: 2.4 — ABNORMAL HIGH (ref 0.9–1.1)
Protime: 27.6 s — ABNORMAL HIGH (ref 10.2–12.9)

## 2016-11-14 LAB — MAGNESIUM: Magnesium: 1.9 mg/dL (ref 1.5–2.4)

## 2016-11-14 LAB — ESTIMATED GFR
GFR,Black: >60 mL/min
GFR,Caucasian: 58 mL/min — AB

## 2016-11-14 LAB — POCT GLUCOSE
Glucose POCT: 147 mg/dL — ABNORMAL HIGH (ref 65–100)
Glucose POCT: 172 mg/dL — ABNORMAL HIGH (ref 65–100)
Glucose POCT: 191 mg/dL — ABNORMAL HIGH (ref 65–100)
Glucose POCT: 209 mg/dL — ABNORMAL HIGH (ref 65–100)

## 2016-11-15 ENCOUNTER — Telehealth: Payer: Self-pay

## 2016-11-15 LAB — CBC AND DIFFERENTIAL
Baso # K/uL: 0.1 10*3/uL (ref 0.0–0.2)
Basophil %: 1 % (ref 0–3)
Eos # K/uL: 0.3 10*3/uL (ref 0.0–0.6)
Eosinophil %: 4 % (ref 0–5)
Hematocrit: 38 % — ABNORMAL LOW (ref 40–52)
Hemoglobin: 12.4 g/dL — ABNORMAL LOW (ref 13.0–18.0)
Lymph # K/uL: 2.5 10*3/uL (ref 1.0–4.8)
Lymphocyte %: 32 % (ref 15–45)
MCH: 30.3 pg (ref 26.0–34.0)
MCHC: 32.8 g/dL (ref 32.0–37.5)
MCV: 92 fL (ref 80–100)
Mono # K/uL: 0.7 10*3/uL (ref 0.1–1.0)
Monocyte %: 9 % (ref 0–15)
Neut # K/uL: 4.3 10*3/uL (ref 1.8–8.0)
Platelets: 110 10*3/uL — ABNORMAL LOW (ref 150–450)
RBC: 4.09 10*6/uL — ABNORMAL LOW (ref 4.40–6.20)
RDW: 13.6 % (ref 0.0–15.2)
Seg Neut %: 54 % (ref 45–75)
WBC: 7.9 10*3/uL (ref 4.0–11.0)

## 2016-11-15 LAB — BASIC METABOLIC PANEL
Anion Gap: 9 mEq/L (ref 4–16)
CO2: 28 mEq/L (ref 20–31)
Calcium: 9.3 mg/dL (ref 8.5–10.4)
Chloride: 103 mEq/L (ref 98–108)
Creatinine: 1.3 mg/dL — ABNORMAL HIGH (ref 0.7–1.2)
Glucose: 147 mg/dL — ABNORMAL HIGH (ref 65–100)
Lab: 37 mg/dL — ABNORMAL HIGH (ref 8–20)
Potassium: 4.1 mEq/L (ref 3.5–5.1)
Sodium: 140 mEq/L (ref 135–145)
UN/Creat Ratio: 28.5 — ABNORMAL HIGH (ref 12.0–20.0)

## 2016-11-15 LAB — LIPID PANEL
Chol/HDL Ratio: 2.6
Cholesterol: 105 mg/dL (ref 100–200)
HDL: 40 mg/dL (ref 35–130)
LDL Calculated: 50 mg/dL (ref 30–100)
Non HDL Cholesterol: 65 mg/dL — ABNORMAL LOW (ref 95–160)
Triglycerides: 74 mg/dL (ref 30–150)

## 2016-11-15 LAB — PROTIME-INR
INR: 1.9 — ABNORMAL HIGH (ref 0.9–1.1)
Protime: 22 s — ABNORMAL HIGH (ref 10.2–12.9)

## 2016-11-15 LAB — POCT GLUCOSE
Glucose POCT: 160 mg/dL — ABNORMAL HIGH (ref 65–100)
Glucose POCT: 162 mg/dL — ABNORMAL HIGH (ref 65–100)
Glucose POCT: 229 mg/dL — ABNORMAL HIGH (ref 65–100)

## 2016-11-15 LAB — MAGNESIUM: Magnesium: 2.1 mg/dL (ref 1.5–2.4)

## 2016-11-15 LAB — ESTIMATED GFR
GFR,Black: >60 mL/min
GFR,Caucasian: 53 mL/min — AB

## 2016-11-15 LAB — HEMOGLOBIN A1C
Est Avg Glucose: 154 mg/dL — ABNORMAL HIGH (ref 68–126)
Hemoglobin A1C: 7 % — ABNORMAL HIGH (ref 4.0–6.0)

## 2016-11-15 NOTE — Telephone Encounter (Signed)
Pt's wife called to cancel ECHO as pt is currently at [facility] having a CATH. Called PCP office and advised. DONE

## 2016-11-16 LAB — CBC AND DIFFERENTIAL
Baso # K/uL: 0 10*3/uL (ref 0.0–0.2)
Basophil %: 1 % (ref 0–3)
Eos # K/uL: 0.3 10*3/uL (ref 0.0–0.6)
Eosinophil %: 4 % (ref 0–5)
Hematocrit: 38 % — ABNORMAL LOW (ref 40–52)
Hemoglobin: 12.4 g/dL — ABNORMAL LOW (ref 13.0–18.0)
Lymph # K/uL: 2.1 10*3/uL (ref 1.0–4.8)
Lymphocyte %: 30 % (ref 15–45)
MCH: 30.4 pg (ref 26.0–34.0)
MCHC: 32.8 g/dL (ref 32.0–37.5)
MCV: 93 fL (ref 80–100)
Mono # K/uL: 0.7 10*3/uL (ref 0.1–1.0)
Monocyte %: 9 % (ref 0–15)
Neut # K/uL: 4 10*3/uL (ref 1.8–8.0)
Platelets: 105 10*3/uL — ABNORMAL LOW (ref 150–450)
RBC: 4.08 10*6/uL — ABNORMAL LOW (ref 4.40–6.20)
RDW: 13.8 % (ref 0.0–15.2)
Seg Neut %: 56 % (ref 45–75)
WBC: 7 10*3/uL (ref 4.0–11.0)

## 2016-11-16 LAB — MAGNESIUM: Magnesium: 2.1 mg/dL (ref 1.5–2.4)

## 2016-11-16 LAB — BASIC METABOLIC PANEL
Anion Gap: 9 mEq/L (ref 4–16)
CO2: 25 mEq/L (ref 20–31)
Calcium: 9.3 mg/dL (ref 8.5–10.4)
Chloride: 105 mEq/L (ref 98–108)
Creatinine: 1.2 mg/dL (ref 0.7–1.2)
Glucose: 157 mg/dL — ABNORMAL HIGH (ref 65–100)
Lab: 35 mg/dL — ABNORMAL HIGH (ref 8–20)
Potassium: 4 mEq/L (ref 3.5–5.1)
Sodium: 139 mEq/L (ref 135–145)
UN/Creat Ratio: 29.2 — ABNORMAL HIGH (ref 12.0–20.0)

## 2016-11-16 LAB — PROTIME-INR
INR: 1.6 — ABNORMAL HIGH (ref 0.9–1.1)
Protime: 18.4 s — ABNORMAL HIGH (ref 10.2–12.9)

## 2016-11-16 LAB — POCT GLUCOSE
Glucose POCT: 166 mg/dL — ABNORMAL HIGH (ref 65–100)
Glucose POCT: 171 mg/dL — ABNORMAL HIGH (ref 65–100)
Glucose POCT: 136 mg/dL — ABNORMAL HIGH (ref 65–100)
Glucose POCT: 258 mg/dL — ABNORMAL HIGH (ref 65–100)

## 2016-11-16 LAB — ESTIMATED GFR
GFR,Black: >60 mL/min
GFR,Caucasian: 58 mL/min — AB

## 2016-11-17 ENCOUNTER — Other Ambulatory Visit: Payer: Self-pay | Admitting: Primary Care

## 2016-11-17 DIAGNOSIS — E119 Type 2 diabetes mellitus without complications: Secondary | ICD-10-CM

## 2016-11-17 LAB — CBC AND DIFFERENTIAL
Baso # K/uL: 0.1 10*3/uL (ref 0.0–0.2)
Basophil %: 1 % (ref 0–3)
Eos # K/uL: 0.3 10*3/uL (ref 0.0–0.6)
Eosinophil %: 5 % (ref 0–5)
Hematocrit: 37 % — ABNORMAL LOW (ref 40–52)
Hemoglobin: 12.1 g/dL — ABNORMAL LOW (ref 13.0–18.0)
Lymph # K/uL: 1.7 10*3/uL (ref 1.0–4.8)
Lymphocyte %: 29 % (ref 15–45)
MCH: 30.4 pg (ref 26.0–34.0)
MCHC: 32.5 g/dL (ref 32.0–37.5)
MCV: 94 fL (ref 80–100)
Mono # K/uL: 0.6 10*3/uL (ref 0.1–1.0)
Monocyte %: 10 % (ref 0–15)
Neut # K/uL: 3.2 10*3/uL (ref 1.8–8.0)
Platelets: 102 10*3/uL — ABNORMAL LOW (ref 150–450)
RBC: 3.98 10*6/uL — ABNORMAL LOW (ref 4.40–6.20)
RDW: 13.5 % (ref 0.0–15.2)
Seg Neut %: 55 % (ref 45–75)
WBC: 5.9 10*3/uL (ref 4.0–11.0)

## 2016-11-17 LAB — PROTIME-INR
INR: 1.7 — ABNORMAL HIGH (ref 0.9–1.1)
Protime: 19.9 s — ABNORMAL HIGH (ref 10.2–12.9)

## 2016-11-17 LAB — BASIC METABOLIC PANEL
Anion Gap: 7 mEq/L (ref 4–16)
CO2: 27 mEq/L (ref 20–31)
Calcium: 9.2 mg/dL (ref 8.5–10.4)
Chloride: 105 mEq/L (ref 98–108)
Creatinine: 1.2 mg/dL (ref 0.7–1.2)
Glucose: 156 mg/dL — ABNORMAL HIGH (ref 65–100)
Lab: 29 mg/dL — ABNORMAL HIGH (ref 8–20)
Potassium: 4.1 mEq/L (ref 3.5–5.1)
Sodium: 139 mEq/L (ref 135–145)
UN/Creat Ratio: 24.2 — ABNORMAL HIGH (ref 12.0–20.0)

## 2016-11-17 LAB — ESTIMATED GFR
GFR,Black: >60 mL/min
GFR,Caucasian: 58 mL/min — AB

## 2016-11-17 LAB — POCT GLUCOSE
Glucose POCT: 183 mg/dL — ABNORMAL HIGH (ref 65–100)
Glucose POCT: 182 mg/dL — ABNORMAL HIGH (ref 65–100)

## 2016-11-17 LAB — MAGNESIUM: Magnesium: 2 mg/dL (ref 1.5–2.4)

## 2016-11-19 ENCOUNTER — Other Ambulatory Visit
Admission: RE | Admit: 2016-11-19 | Discharge: 2016-11-19 | Disposition: A | Discharge status: Home / Self Care | Payer: Self-pay | Source: Ambulatory Visit

## 2016-11-19 ENCOUNTER — Encounter: Payer: Self-pay | Admitting: Primary Care

## 2016-11-19 ENCOUNTER — Telehealth: Payer: Self-pay | Admitting: Primary Care

## 2016-11-19 DIAGNOSIS — R0600 Dyspnea, unspecified: Secondary | ICD-10-CM

## 2016-11-19 DIAGNOSIS — R0609 Other forms of dyspnea: Secondary | ICD-10-CM

## 2016-11-19 LAB — CBC AND DIFFERENTIAL
Baso # K/uL: 0.1 10*3/uL (ref 0.0–0.1)
Basophil %: 0.9 %
Eos # K/uL: 0.4 10*3/uL (ref 0.0–0.5)
Eosinophil %: 6.9 %
Hematocrit: 40 % (ref 40–51)
Hemoglobin: 12.8 g/dL — ABNORMAL LOW (ref 13.7–17.5)
IMM Granulocytes #: 0 10*3/uL (ref 0.0–0.1)
IMM Granulocytes: 0.2 %
Lymph # K/uL: 1.5 10*3/uL (ref 1.3–3.6)
Lymphocyte %: 28.8 %
MCH: 31 pg/cell (ref 26–32)
MCHC: 32 g/dL (ref 32–37)
MCV: 95 fL — ABNORMAL HIGH (ref 79–92)
Mono # K/uL: 0.5 10*3/uL (ref 0.3–0.8)
Monocyte %: 8.6 %
Neut # K/uL: 2.9 10*3/uL (ref 1.8–5.4)
Nucl RBC # K/uL: 0 10*3/uL (ref 0.0–0.0)
Nucl RBC %: 0 /100 WBC (ref 0.0–0.2)
Platelets: 104 10*3/uL — ABNORMAL LOW (ref 150–330)
RBC: 4.2 MIL/uL — ABNORMAL LOW (ref 4.6–6.1)
RDW: 13.3 % (ref 11.6–14.4)
Seg Neut %: 54.6 %
WBC: 5.4 10*3/uL (ref 4.2–9.1)

## 2016-11-19 LAB — COMPREHENSIVE METABOLIC PANEL
ALT: 23 U/L (ref 0–50)
AST: 32 U/L (ref 0–50)
Albumin: 3.7 g/dL (ref 3.5–5.2)
Alk Phos: 55 U/L (ref 40–130)
Anion Gap: 12 (ref 7–16)
Bilirubin,Total: 0.7 mg/dL (ref 0.0–1.2)
CO2: 23 mmol/L (ref 20–28)
Calcium: 9.2 mg/dL (ref 8.6–10.2)
Chloride: 104 mmol/L (ref 96–108)
Creatinine: 1.21 mg/dL — ABNORMAL HIGH (ref 0.67–1.17)
GFR,Black: 65 *
GFR,Caucasian: 56 * — AB
Glucose: 139 mg/dL — ABNORMAL HIGH (ref 60–99)
Lab: 22 mg/dL — ABNORMAL HIGH (ref 6–20)
Potassium: 4.4 mmol/L (ref 3.3–5.1)
Sodium: 139 mmol/L (ref 133–145)
Total Protein: 6.5 g/dL (ref 6.3–7.7)

## 2016-11-19 LAB — MULTIPLE ORDERING DOCS

## 2016-11-19 LAB — PROTIME-INR
INR: 2.2 — ABNORMAL HIGH (ref 0.9–1.1)
Protime: 25.9 s — ABNORMAL HIGH (ref 10.0–12.9)

## 2016-11-19 LAB — TSH: TSH: 6.83 u[IU]/mL — ABNORMAL HIGH (ref 0.27–4.20)

## 2016-11-19 LAB — T4, FREE: Free T4: 1.2 ng/dL (ref 0.9–1.7)

## 2016-11-19 LAB — NT-PRO BNP: NT-pro BNP: 2554 pg/mL — ABNORMAL HIGH (ref 0–1800)

## 2016-11-19 NOTE — Telephone Encounter (Signed)
-----   Message from Edythe Clarity, MD sent at 11/19/2016 12:36 PM EST -----  Please find out whether he is still hospitalized at [facility], and who is managing his INR.  Thank you.

## 2016-11-19 NOTE — Telephone Encounter (Signed)
Spoke to Mrs. Jon [name].  He was discharged on Saturday and Dr. Lucendia Herrlich is managing his irn for now

## 2016-11-20 ENCOUNTER — Telehealth: Payer: Self-pay | Admitting: Primary Care

## 2016-11-20 ENCOUNTER — Encounter: Payer: Self-pay | Admitting: Primary Care

## 2016-11-20 ENCOUNTER — Inpatient Hospital Stay: Admit: 2016-11-20 | Payer: Self-pay

## 2016-11-20 ENCOUNTER — Ambulatory Visit: Payer: Self-pay | Admitting: Primary Care

## 2016-11-20 ENCOUNTER — Ambulatory Visit
Admission: RE | Admit: 2016-11-20 | Payer: Self-pay | Source: Ambulatory Visit | Attending: Cardiology | Admitting: Cardiology

## 2016-11-20 VITALS — BP 88/60 | HR 68 | Wt 195.0 lb

## 2016-11-20 DIAGNOSIS — R7989 Other specified abnormal findings of blood chemistry: Secondary | ICD-10-CM | POA: Insufficient documentation

## 2016-11-20 DIAGNOSIS — J310 Chronic rhinitis: Secondary | ICD-10-CM

## 2016-11-20 DIAGNOSIS — I509 Heart failure, unspecified: Secondary | ICD-10-CM

## 2016-11-20 DIAGNOSIS — I480 Paroxysmal atrial fibrillation: Secondary | ICD-10-CM

## 2016-11-20 LAB — PCMH FALL RISK PLAN

## 2016-11-20 LAB — PCMH FALL RISK ASSESSMENT

## 2016-11-20 NOTE — Telephone Encounter (Signed)
I spoke with [name] and she prefers you call Dr. Lucendia Herrlich and do Doctor to Doctor.

## 2016-11-20 NOTE — Progress Notes (Signed)
CC: "good"    HPI:  Jon [name] is a 82 y.o.male here for f/u of various issues    Here with wife.    Per patient:    1.  CHF/a.fib/DOE - was admitted 11/12/2016 to [facility] after TTE showing being found w/cardiomyopathy and a.fib.  Upon admission, diuresed w/IV lasix - wife reports that he lost ~4 lbs, with improvement in breathing and leg swelling.  Underwent TEE cardioversion with restoration to NSR.  Was transitioned on lovenox bridge to coumadin, and added on lasix 22m daily, metoprolol XL 237mdaily and lisinopril 2.48m76maily.  Was discharged on 11/17/2016 - since then feeling well.  No dizziness, no CP, SOB much improved.  Has f/u with Dr. FriLucendia Herrlich 11/26/2016.          Review of systems:   No fever or chills  No headache  No sore throat  No chest pain  No shortness of breath  No cough  No change of appetite  No nausea  No vomiting  No abdominal pain  No stool changes  No urinary symptoms  No dizziness or lightheadedness  No depression  No skin rashes      Past Medical History:   Diagnosis Date    Arthritis     BPH (benign prostatic hypertrophy)     Dementia     mild    DM type 2 (diabetes mellitus, type 2)     Paget's disease of bone     Protein S deficiency     On chronic anticoagulation    Sinus bradycardia     Varicella      Past Surgical History:   Procedure Laterality Date    CHOLECYSTECTOMY      JOINT REPLACEMENT      KNEE REPLACEMENT      SKIN GRAFT      TONSILLECTOMY         Allergies:   Allergies   Allergen Reactions    Bee Venom Other (See Comments)     Red and swelling    No Known Latex Allergy      Created by Conversion - 0;     Insects [Other] Other (See Comments)     Red and swelling     Family History   Problem Relation Age of Onset    BRCA 1/2 Brother      Social History     Social History    Marital status: Married     Spouse name: N/A    Number of children: N/A    Years of education: N/A     Occupational History    Former PriPersonal assistantsed to work night shift      Social  History Main Topics    Smoking status: Never Smoker    Smokeless tobacco: Never Used    Alcohol use No    Drug use: No    Sexual activity: Not Asked     Other Topics Concern    None     Social History Narrative    2 daughters with neurological issues       Current Outpatient Prescriptions   Medication Sig Dispense Refill    furosemide (LASIX) 20 MG tablet Take 20 mg by mouth every morning      lisinopril (PRINIVIL,ZESTRIL) 2.5 MG tablet Take 2.5 mg by mouth daily      enoxaparin (LOVENOX) 80 mg/0.8mL248mjection Inject 70 mg into the skin 2 times daily      metoprolol (TOPROL-XL)  25 MG 24 hr tablet Take 25 mg by mouth daily   Do not crush or chew. May be divided.      rosuvastatin (CRESTOR) 10 MG tablet TAKE 1 TABLET BY MOUTH EVERY DAY WITH DINNER 30 tablet 5    ipratropium (ATROVENT) 0.03 % nasal spray 2 sprays by Each Nare route 3 times daily 30 mL 1    Liraglutide (VICTOZA) 18 MG/3ML injection Inject 1.8 mg into the skin daily 18 mL 4    memantine (NAMENDA) 10 MG tablet TAKE 1 TABLET BY MOUTH TWO TIMES DAILY 180 tablet 3    metFORMIN (GLUCOPHAGE) 1000 MG tablet TAKE 1 TABLET BY MOUTH TWO TIMES DAILY WITH FOOD 180 tablet 1    FREESTYLE LITE test strip TO BE USED TWO TIMES DAILY TO TEST BLOOD SUGAR 100 strip 4    mirabegron (MYRBETRIQ) 50 MG 24 hr tablet Take 1 tablet (50 mg total) by mouth daily 90 tablet 2    insulin pen needle (BD ULTRA-FINE PEN NEEDLE NANO) 32G X 4 MM Use 1 times a day as instructed with Victoza pens 100 each 3    finasteride (PROSCAR) 5 MG tablet TAKE ONE TABLET BY MOUTH ONCE DAILY FOR USE BY MEN ONLY 90 tablet 3    meloxicam (MOBIC) 7.5 MG tablet Take 1 tablet (7.5 mg total) by mouth daily 90 tablet 3    donepezil (ARICEPT) 10 MG tablet Take 1 tablet (10 mg total) by mouth daily 90 tablet 3    COUMADIN 4 MG tablet Take 1 tablet (4 mg total) by mouth daily 90 tablet 3    blood glucose monitor (FREESTYLE LITE) kit Use as instructed 1 each 0    lancets (ONETOUCH ULTRASOFT)  Use   2  times per day as instructed for blood glucose testing. 100 each 3    COUMADIN 2.5 MG tablet TAKE 2 TABLETS BY MOUTH ON MONDAY AND THURSDAY. TAKE 1 AND 1/2 TABLETS BY MOUTH THE REST OF DAYS OR AS DIRECTED 138 tablet 4    Blood Glucose Calibration (OT ULTRA/FASTTK CNTRL SOLN) SOLN As directed 1 each 5    CINNAMON PO Take by mouth      cyanocobalamin (VITAMIN B-12) 1000 MCG tablet Take 1,000 mcg by mouth daily      Co-Enzyme Q-10 100 MG CAPS Take by mouth      Multiple Vitamins-Minerals (MULTI VITAMIN/MINERALS PO) Take by mouth      HYDROcodone-acetaminophen (VICODIN) 5-500 MG per tablet 1-2 po q 6 hours prn pain MDD=6 180 tablet 1    cholecalciferol (VITAMIN D) 1000 UNIT tablet Take 2,000 Units by mouth daily          FISH OIL by Does not apply route daily.      acetaminophen (TYLENOL) 500 MG tablet Take 500 mg by mouth 2 times daily.       No current facility-administered medications for this visit.          Physical Exam:  Vitals:    11/20/16 1552   BP: (!) 88/60   Pulse: 68   Weight: 88.5 kg (195 lb)       Constitutional: Comfortable, no apparent distress, overweight  Eyes:  Equal, round and reactive, conjunctivae clear, lids normal  ENT/mouth: Moist mucous membranes, no oropharyngeal lesions, good dentition, hearing intact  Resp: Unlabored breathing effort, lungs clear to auscultation bilaterally, no wheezes, no rhonchi  CV: Irregular S1/S2, no murmurs, very mild peripheral edema (R>L)  Abd: Abdomen soft, not tender, not distended,  bowel sounds present, no hepatosplenomegaly  MSK: No joint tenderness, no inflammation of joints  Skin:  Warm, dry, good turgor, no rashes  Neuro: Speech intact, no motor deficits, no sensory deficits  Psych:  Alert and oriented x3 (with with recent memory issues), normal affect and mood    EKG - rate=88, a.fib, LBBB.    Assessment:  82 y.o.male today presenting for f/u of various issues - now with recurrence of a.fib on EKG, mild hypotension (SBP=88).         Plan:    1.  CHF and recurrence of a.fib - without symptoms of SOB, CP or dizziness   - patient is therapeutic with INR=2.2 - can d/c lovenox   - in light of low BP's - to hold lisinopril for now   - con't metoprolol XL 35m daily   - con't coumadin   - [facility](Dr. VVinson Moselleon call fo Dr. FLucendia Herrlich    2.  Elevated TSH - with rise after recent CHF/a.fib   - will d/w cardiology about this    3.  Rhinitis - no complaints today   - will f/u on this next visit      Follow up: after being seen by cardiology    LJacklyn Shell MD  Internal Medicine      Addendum:    Discussed case above w/cardiology (Dr. VVinson Moselle - agree with above plan, their office will contact the patient tomorrow to arrange to be seen ASAP (either tomorrow or Thursday).  He may be heading towards chronic a.fib, may be requiring amiodarone.      Informed patient's wife of the plan - she is appreciative of the call.    LJacklyn Shell MD  Internal Medicine

## 2016-11-20 NOTE — Telephone Encounter (Signed)
-----   Message from Edythe Clarity, MD sent at 11/20/2016 10:40 AM EST -----  TSH with some elevation (6.83), but not on thyroid replacement.  In light of cardiac issues, he should ask his cardiologist (Dr. Lucendia Herrlich) whether starting a low dose thyroid supplement would be in order.  Let me know if he can't get through to them - we'd be happy to call on his behalf (although Dr. Lucendia Herrlich has been pretty involved lately, so I suspect they are talking...).

## 2016-11-20 NOTE — Patient Instructions (Signed)
1.  Stop the lovenox    2.  Hold the lisinopril until further notice    3.  If you feel more short of breath, or more dizzy than usual, or any chest pain - go to the emergency room

## 2016-11-23 ENCOUNTER — Telehealth: Payer: Self-pay | Admitting: Primary Care

## 2016-11-23 NOTE — Telephone Encounter (Signed)
I spoke with [name] and [name] will go on Monday to have INR done.

## 2016-11-26 ENCOUNTER — Ambulatory Visit: Payer: Self-pay | Admitting: Primary Care

## 2016-11-26 ENCOUNTER — Other Ambulatory Visit
Admission: RE | Admit: 2016-11-26 | Discharge: 2016-11-26 | Disposition: A | Discharge status: Home / Self Care | Payer: Self-pay | Source: Ambulatory Visit | Attending: Primary Care | Admitting: Primary Care

## 2016-11-26 ENCOUNTER — Telehealth: Payer: Self-pay | Admitting: Primary Care

## 2016-11-26 DIAGNOSIS — I480 Paroxysmal atrial fibrillation: Secondary | ICD-10-CM

## 2016-11-26 LAB — PROTIME-INR
INR: 3.5 — ABNORMAL HIGH (ref 0.9–1.1)
Protime: 40.6 s — ABNORMAL HIGH (ref 10.0–12.9)

## 2016-11-26 NOTE — Telephone Encounter (Signed)
Lab called Jon [name] was in to get inr done  Needs new order

## 2016-11-26 NOTE — Telephone Encounter (Signed)
Previous orders were expired

## 2016-11-26 NOTE — Patient Instructions (Signed)
Reviewed with Venard and wife

## 2016-11-26 NOTE — Telephone Encounter (Signed)
There are several standing orders for INR's.  Can we please sort them out?... I'm not sure which one to d/c... Thanks.

## 2016-11-28 NOTE — Telephone Encounter (Signed)
INR done and addressed.

## 2016-12-03 ENCOUNTER — Other Ambulatory Visit: Payer: Self-pay | Admitting: Family

## 2016-12-03 ENCOUNTER — Telehealth: Payer: Self-pay | Admitting: Primary Care

## 2016-12-03 DIAGNOSIS — R413 Other amnesia: Secondary | ICD-10-CM

## 2016-12-03 DIAGNOSIS — Z789 Other specified health status: Secondary | ICD-10-CM

## 2016-12-03 LAB — MAGNESIUM: Magnesium: 1.5 mg/dL — ABNORMAL LOW (ref 1.6–2.6)

## 2016-12-03 LAB — CBC
Hematocrit: 42 % (ref 40–52)
Hemoglobin: 13.8 g/dL (ref 13.0–17.5)
MCH: 31 pg (ref 26.0–34.0)
MCHC: 33.1 g/dL (ref 32.0–36.0)
MCV: 94 fL (ref 81–99)
Platelets: 132 10*3/uL — ABNORMAL LOW (ref 140–400)
RBC: 4.45 10*6/uL — ABNORMAL LOW (ref 4.60–6.20)
RDW: 13.2 % (ref 11.5–15.0)
WBC: 7.6 10*3/uL (ref 4.0–10.0)

## 2016-12-03 LAB — BASIC METABOLIC PANEL
Anion Gap: 10 mEq/L (ref 7–16)
CO2: 24 mEq/L (ref 20–31)
Calcium: 9.3 mg/dL (ref 8.6–10.2)
Chloride: 104 mEq/L (ref 99–109)
Creatinine: 1.35 mg/dL — ABNORMAL HIGH (ref 0.70–1.30)
Glucose: 133 mg/dL — ABNORMAL HIGH (ref 60–99)
Lab: 24 mg/dL — ABNORMAL HIGH (ref 9–23)
Potassium: 4.5 mEq/L (ref 3.5–5.1)
Sodium: 138 mEq/L (ref 136–145)
UN/Creat Ratio: 17.8 (ref 10.0–20.0)

## 2016-12-03 LAB — ESTIMATED GFR
GFR,Black: >60 mL/min
GFR,Caucasian: 51 mL/min — AB

## 2016-12-03 LAB — POCT GLUCOSE
Glucose POCT: 103 mg/dL — ABNORMAL HIGH (ref 60–98)
Glucose POCT: 144 mg/dL — ABNORMAL HIGH (ref 60–98)

## 2016-12-03 LAB — PROTIME-INR
INR: 3.3 — ABNORMAL HIGH (ref 0.9–1.1)
Protime: 38.2 s — ABNORMAL HIGH (ref 10.2–12.9)

## 2016-12-03 LAB — TSH: TSH: 4.62 u[IU]/mL (ref 0.35–5.50)

## 2016-12-03 NOTE — Telephone Encounter (Signed)
Albina Billet from cardiology at [facility] called and left a message that Jon [name] is being admitted to [facility] for Tikosyn for his a-fib and there is an interaction between [employer] and Aricept.  She would like to know if Jon [name] goes with out it so that the Tikosyn can be started.  Please advise

## 2016-12-03 NOTE — Telephone Encounter (Signed)
This will need to be a discussion between cardiology and the memory clinic.  Will place referral to memory care for their input, appreciate help.

## 2016-12-03 NOTE — Telephone Encounter (Signed)
I spoke with Albina Billet and she states that [name] can't go back on Aricept ever due to being on Tikosyn.

## 2016-12-04 LAB — BASIC METABOLIC PANEL
Anion Gap: 7 mEq/L (ref 7–16)
CO2: 28 mEq/L (ref 20–31)
Calcium: 9.4 mg/dL (ref 8.6–10.2)
Chloride: 104 mEq/L (ref 99–109)
Creatinine: 1.19 mg/dL (ref 0.70–1.30)
Glucose: 109 mg/dL — ABNORMAL HIGH (ref 60–99)
Lab: 22 mg/dL (ref 9–23)
Potassium: 4.2 mEq/L (ref 3.5–5.1)
Sodium: 139 mEq/L (ref 136–145)
UN/Creat Ratio: 18.5 (ref 10.0–20.0)

## 2016-12-04 LAB — POCT GLUCOSE
Glucose POCT: 116 mg/dL — ABNORMAL HIGH (ref 60–98)
Glucose POCT: 170 mg/dL — ABNORMAL HIGH (ref 60–98)
Glucose POCT: 108 mg/dL — ABNORMAL HIGH (ref 60–98)
Glucose POCT: 128 mg/dL — ABNORMAL HIGH (ref 60–98)

## 2016-12-04 LAB — MAGNESIUM: Magnesium: 2 mg/dL (ref 1.6–2.6)

## 2016-12-04 LAB — ESTIMATED GFR
GFR,Black: >60 mL/min
GFR,Caucasian: 59 mL/min — AB

## 2016-12-04 LAB — PROTIME-INR
INR: 3 — ABNORMAL HIGH (ref 0.9–1.1)
Protime: 35.2 s — ABNORMAL HIGH (ref 10.2–12.9)

## 2016-12-04 NOTE — Telephone Encounter (Signed)
I spoke with [name] and she will wait for the phone call for referral to Memory clinic and will go from there with medications.

## 2016-12-05 LAB — PROTIME-INR
INR: 3 — ABNORMAL HIGH (ref 0.9–1.1)
Protime: 35.2 s — ABNORMAL HIGH (ref 10.2–12.9)

## 2016-12-05 LAB — BASIC METABOLIC PANEL
Anion Gap: 5 mEq/L — ABNORMAL LOW (ref 7–16)
CO2: 29 mEq/L (ref 20–31)
Calcium: 9.4 mg/dL (ref 8.6–10.2)
Chloride: 105 mEq/L (ref 99–109)
Creatinine: 1.17 mg/dL (ref 0.70–1.30)
Glucose: 136 mg/dL — ABNORMAL HIGH (ref 60–99)
Lab: 22 mg/dL (ref 9–23)
Potassium: 4.4 mEq/L (ref 3.5–5.1)
Sodium: 139 mEq/L (ref 136–145)
UN/Creat Ratio: 18.8 (ref 10.0–20.0)

## 2016-12-05 LAB — POCT GLUCOSE
Glucose POCT: 125 mg/dL — ABNORMAL HIGH (ref 60–98)
Glucose POCT: 101 mg/dL — ABNORMAL HIGH (ref 60–98)
Glucose POCT: 158 mg/dL — ABNORMAL HIGH (ref 60–98)
Glucose POCT: 150 mg/dL — ABNORMAL HIGH (ref 60–98)

## 2016-12-05 LAB — ESTIMATED GFR
GFR,Black: >60 mL/min
GFR,Caucasian: 60 mL/min

## 2016-12-05 LAB — MAGNESIUM: Magnesium: 1.8 mg/dL (ref 1.6–2.6)

## 2016-12-06 LAB — BASIC METABOLIC PANEL
Anion Gap: 5 mEq/L — ABNORMAL LOW (ref 7–16)
CO2: 27 mEq/L (ref 20–31)
Calcium: 9.2 mg/dL (ref 8.6–10.2)
Chloride: 106 mEq/L (ref 99–109)
Creatinine: 1.07 mg/dL (ref 0.70–1.30)
Glucose: 130 mg/dL — ABNORMAL HIGH (ref 60–99)
Lab: 26 mg/dL — ABNORMAL HIGH (ref 9–23)
Potassium: 4.4 mEq/L (ref 3.5–5.1)
Sodium: 138 mEq/L (ref 136–145)
UN/Creat Ratio: 24.3 — ABNORMAL HIGH (ref 10.0–20.0)

## 2016-12-06 LAB — ESTIMATED GFR
GFR,Black: >60 mL/min
GFR,Caucasian: >60 mL/min

## 2016-12-06 LAB — POCT GLUCOSE: Glucose POCT: 120 mg/dL — ABNORMAL HIGH (ref 60–98)

## 2016-12-06 LAB — MAGNESIUM: Magnesium: 2.1 mg/dL (ref 1.6–2.6)

## 2016-12-06 LAB — PROTIME-INR
INR: 2.8 — ABNORMAL HIGH (ref 0.9–1.1)
Protime: 33 s — ABNORMAL HIGH (ref 10.2–12.9)

## 2016-12-11 ENCOUNTER — Telehealth: Payer: Self-pay | Admitting: Primary Care

## 2016-12-11 NOTE — Telephone Encounter (Signed)
I spoke with [name] and he will get it done tomorrow.

## 2016-12-12 ENCOUNTER — Other Ambulatory Visit
Admission: RE | Admit: 2016-12-12 | Discharge: 2016-12-12 | Disposition: A | Discharge status: Home / Self Care | Payer: Self-pay | Source: Ambulatory Visit | Attending: Primary Care | Admitting: Primary Care

## 2016-12-12 ENCOUNTER — Ambulatory Visit: Payer: Self-pay

## 2016-12-12 DIAGNOSIS — I480 Paroxysmal atrial fibrillation: Secondary | ICD-10-CM

## 2016-12-12 LAB — PROTIME-INR
INR: 3.7 — ABNORMAL HIGH (ref 0.9–1.1)
Protime: 43.6 s — ABNORMAL HIGH (ref 10.0–12.9)

## 2016-12-12 NOTE — Patient Instructions (Signed)
I spoke with [name] and she will have [name] go to 4 mg on Mon, Thurs and 2 mg rest of week and repeat again in two weeks.

## 2016-12-12 NOTE — Telephone Encounter (Signed)
INR done and addressed.

## 2016-12-28 ENCOUNTER — Telehealth: Payer: Self-pay

## 2016-12-28 ENCOUNTER — Other Ambulatory Visit
Admission: RE | Admit: 2016-12-28 | Discharge: 2016-12-28 | Disposition: A | Discharge status: Home / Self Care | Payer: Self-pay | Source: Ambulatory Visit | Attending: Primary Care | Admitting: Primary Care

## 2016-12-28 ENCOUNTER — Other Ambulatory Visit: Payer: Self-pay | Admitting: Family

## 2016-12-28 ENCOUNTER — Ambulatory Visit: Payer: Self-pay

## 2016-12-28 ENCOUNTER — Telehealth: Payer: Self-pay | Admitting: Primary Care

## 2016-12-28 ENCOUNTER — Encounter: Payer: Self-pay | Admitting: Gastroenterology

## 2016-12-28 ENCOUNTER — Other Ambulatory Visit: Payer: Self-pay | Admitting: Cardiology

## 2016-12-28 DIAGNOSIS — I480 Paroxysmal atrial fibrillation: Secondary | ICD-10-CM

## 2016-12-28 LAB — COMPREHENSIVE METABOLIC PANEL
A/G RATIO: 1.4 (ref 1.1–1.8)
ALT: 23 U/L (ref 1–44)
AST: 27 U/L (ref 14–39)
Albumin: 4.2 g/dL (ref 3.5–5.2)
Alk Phos: 61 U/L (ref 53–129)
Bilirubin,Total: 0.7 mg/dL (ref 0.3–1.2)
CO2: 24 mmol/L (ref 20–31)
Calcium: 9.5 mg/dL (ref 8.6–10.2)
Chloride: 104 mmol/L (ref 99–109)
Creatinine: 1.23 mg/dL (ref 0.70–1.30)
Globulin: 2.9 GM/DL (ref 1.6–3.6)
Glucose: 105 MG/DL (ref 60–140)
Lab: 29 mg/dL — ABNORMAL HIGH (ref 9–23)
Potassium: 4.6 mmol/L (ref 3.5–5.1)
Sodium: 140 mmol/L (ref 136–145)
Total Protein: 7.1 g/dL (ref 6.4–8.3)
UN/Creat Ratio: 24 — ABNORMAL HIGH (ref 10–20)

## 2016-12-28 LAB — PROTIME-INR
INR: 2 — ABNORMAL HIGH (ref 0.9–1.1)
INR: 1.97 — ABNORMAL HIGH (ref 0.90–1.10)
Protime: 23.5 s — ABNORMAL HIGH (ref 10.0–12.9)
Protime: 20.9 SECONDS — ABNORMAL HIGH (ref 9.4–11.4)

## 2016-12-28 LAB — ESTIMATED GFR
GFR,Black: >60 mL/min/{1.73_m2} (ref >=60)
GFR,Caucasian: 57 mL/min/{1.73_m2} — ABNORMAL LOW (ref >=60)

## 2016-12-28 NOTE — Telephone Encounter (Signed)
INR done and addressed.

## 2016-12-28 NOTE — Patient Instructions (Signed)
I spoke with [name] and did review INR with her and to continue 4 mg on Mon, Thurs and 2 mg rest of week and recheck again in two weeks.

## 2016-12-28 NOTE — Telephone Encounter (Signed)
Left message on personal voice mail to have INR done.

## 2016-12-28 NOTE — Telephone Encounter (Signed)
[name] was transferred up to me to discuss the status on Orris's referral to the memory care clinic. I did call that office and spoke with a jennifer to see what was going on and how long it will be to schedule something. She advised me that a new provider will be starting next month and they are trying to work with his schedule the best they can. I explained to jennifer that the concerns are medication related and his wife and our office are really looking to get him in sooner than later. She was going to send the message onto the head scheduler to see what can be done for Zavier and once they have an idea, joan will be called and I told her to call me as well with the update. I called [name] back and did explain the situation to her and we'll just wait to hear back from that office. Thanks

## 2016-12-28 NOTE — Telephone Encounter (Signed)
Gabby from Dr. Blair Hailey office, the patient's PCP, is calling to inquire about an appointment date and time for this patient to be seen for a NPV. Writer informed her that the Provider schedules are currently being worked on and that appointments will be made soon. She is requesting a call back at [phone] to advise. Janace Hoard also requested that Memory Care call the patient's wife, [name] at [phone].

## 2017-01-01 ENCOUNTER — Telehealth: Payer: Self-pay | Admitting: Primary Care

## 2017-01-01 NOTE — Telephone Encounter (Signed)
Ok sounds good

## 2017-01-01 NOTE — Telephone Encounter (Signed)
Jon [name] called from the memory care clinic. She wanted to let us know that their office could not see Jon [name] for an acute visit for his medication issues, they are booking out about a year from now. With the providers leaving and waiting for the new providers to arrive they are still rescheduling patients that already had an appointment. She did state that we should try Jon [name] at [facility] because they have more than just a provider, they have NP's too that if he had to he could see them. Jon [name] stated that he should be able to get in with Jon [name] sooner than later, if this is ok with you I will call that office and see what I can do and fax over all pertinent information/referral. Please advise thanks

## 2017-01-01 NOTE — Telephone Encounter (Signed)
Yes, that would be just fine!  Thank you.

## 2017-01-01 NOTE — Telephone Encounter (Signed)
Spoke with Jon [name] regarding referral. Explained that it is not possible to expedite patient for acute visit, but did give number for Memory Clinic at [facility], where patient was just hospitalized for Cardiac issues. It is possible he would be able to obtain consult and potential appt with that clinic for this medication issue.  Jon [name] will call.

## 2017-01-08 ENCOUNTER — Telehealth: Payer: Self-pay | Admitting: Primary Care

## 2017-01-08 DIAGNOSIS — I4891 Unspecified atrial fibrillation: Secondary | ICD-10-CM

## 2017-01-08 DIAGNOSIS — Z789 Other specified health status: Secondary | ICD-10-CM

## 2017-01-08 DIAGNOSIS — R413 Other amnesia: Secondary | ICD-10-CM

## 2017-01-08 NOTE — Telephone Encounter (Signed)
Referral to neurology (per patient's wife request) to Dr. Asencion Partridge Dohmeier [facility], [place]) signed.

## 2017-01-08 NOTE — Telephone Encounter (Signed)
Patient's wife called because she will be in [place] next month. Dr. Asencion Partridge Dohmeier is able to see the patient while in [place], I placed a new referral as outgoing and once signed I will fax information to that office. Please advise thanks

## 2017-01-09 NOTE — Addendum Note (Signed)
Addended by: Lenard Lance on: 01/09/2017 01:42 PM     Modules accepted: Orders

## 2017-01-09 NOTE — Telephone Encounter (Signed)
I spoke with Jon [name] and did let her know I would fax down notes from Dr. Blair Hailey visit's, also she would like a standing order for INR to be mailed to her for the [place] trip.  That was printed and will be mailed today to [place].

## 2017-01-09 NOTE — Telephone Encounter (Signed)
Notes faxed to Dr. Edwena Felty office.

## 2017-01-09 NOTE — Telephone Encounter (Signed)
Requesting pertinent office notes to be faxed to Dr. Brett Fairy [phone] as soon as possible.

## 2017-01-15 ENCOUNTER — Telehealth: Payer: Self-pay | Admitting: Primary Care

## 2017-01-15 NOTE — Telephone Encounter (Signed)
I spoke with [name] and did let her know it was time to have [name]'s INR done and she stated that she already told him to have it done.

## 2017-01-16 ENCOUNTER — Ambulatory Visit: Payer: Self-pay

## 2017-01-16 ENCOUNTER — Other Ambulatory Visit
Admission: RE | Admit: 2017-01-16 | Discharge: 2017-01-16 | Disposition: A | Discharge status: Home / Self Care | Payer: Self-pay | Source: Ambulatory Visit | Attending: Primary Care | Admitting: Primary Care

## 2017-01-16 DIAGNOSIS — I480 Paroxysmal atrial fibrillation: Secondary | ICD-10-CM

## 2017-01-16 LAB — PROTIME-INR
INR: 2 — ABNORMAL HIGH (ref 0.9–1.1)
Protime: 23 s — ABNORMAL HIGH (ref 10.0–12.9)

## 2017-01-16 NOTE — Telephone Encounter (Signed)
INR done and addressed, see flow sheet

## 2017-01-16 NOTE — Patient Instructions (Signed)
I spoke with [name] and reviewed INR with him and to continue 4 mg on Mon, Thurs and 2 mg rest of week and recheck again in four weeks

## 2017-01-17 ENCOUNTER — Encounter (HOSPITAL_COMMUNITY): Payer: Medicare Other | Admitting: Internal Medicine

## 2017-01-17 ENCOUNTER — Other Ambulatory Visit: Payer: Self-pay | Admitting: Primary Care

## 2017-01-18 DIAGNOSIS — Y92098 Other place in other non-institutional residence as the place of occurrence of the external cause: Secondary | ICD-10-CM | POA: Insufficient documentation

## 2017-01-18 DIAGNOSIS — S61412A Laceration without foreign body of left hand, initial encounter: Secondary | ICD-10-CM | POA: Insufficient documentation

## 2017-01-18 DIAGNOSIS — Y998 Other external cause status: Secondary | ICD-10-CM | POA: Insufficient documentation

## 2017-01-18 DIAGNOSIS — S0081XA Abrasion of other part of head, initial encounter: Secondary | ICD-10-CM | POA: Insufficient documentation

## 2017-01-18 DIAGNOSIS — Z7901 Long term (current) use of anticoagulants: Secondary | ICD-10-CM | POA: Insufficient documentation

## 2017-01-18 DIAGNOSIS — Z9181 History of falling: Secondary | ICD-10-CM | POA: Insufficient documentation

## 2017-01-18 DIAGNOSIS — Y9301 Activity, walking, marching and hiking: Secondary | ICD-10-CM | POA: Insufficient documentation

## 2017-01-18 DIAGNOSIS — W1809XA Striking against other object with subsequent fall, initial encounter: Secondary | ICD-10-CM | POA: Insufficient documentation

## 2017-01-18 NOTE — ED Notes (Signed)
01/18/17 2338   OTHER   PCP/Service Referral Jon [name]    Pt Info note/Reason for sending Fall   Pt Coming from PCP Office   Call Back number No Call Back   Call reported to [facility]

## 2017-01-19 ENCOUNTER — Telehealth: Payer: Self-pay | Admitting: Primary Care

## 2017-01-19 ENCOUNTER — Emergency Department
Admission: EM | Admit: 2017-01-19 | Discharge: 2017-01-19 | Disposition: A | Discharge status: Home / Self Care | Payer: Medicare (Managed Care) | Source: Ambulatory Visit | Attending: Emergency Medicine | Admitting: Emergency Medicine

## 2017-01-19 ENCOUNTER — Encounter: Payer: Self-pay | Admitting: Emergency Medicine

## 2017-01-19 DIAGNOSIS — R319 Hematuria, unspecified: Secondary | ICD-10-CM

## 2017-01-19 LAB — BLOOD BANK HOLD LAVENDER

## 2017-01-19 LAB — CBC AND DIFFERENTIAL
Baso # K/uL: 0.1 10*3/uL (ref 0.0–0.1)
Basophil %: 0.6 %
Eos # K/uL: 0.4 10*3/uL (ref 0.0–0.5)
Eosinophil %: 4.9 %
Hematocrit: 37 % — ABNORMAL LOW (ref 40–51)
Hemoglobin: 12.6 g/dL — ABNORMAL LOW (ref 13.7–17.5)
IMM Granulocytes #: 0 10*3/uL (ref 0.0–0.1)
IMM Granulocytes: 0.4 %
Lymph # K/uL: 1.9 10*3/uL (ref 1.3–3.6)
Lymphocyte %: 23.8 %
MCH: 31 pg (ref 26–32)
MCHC: 34 g/dL (ref 32–37)
MCV: 91 fL (ref 79–92)
Mono # K/uL: 0.7 10*3/uL (ref 0.3–0.8)
Monocyte %: 8.8 %
Neut # K/uL: 5 10*3/uL (ref 1.8–5.4)
Nucl RBC # K/uL: 0 10*3/uL (ref 0.0–0.0)
Nucl RBC %: 0 /100 WBC (ref 0.0–0.2)
Platelets: 112 10*3/uL — ABNORMAL LOW (ref 150–330)
RBC: 4.1 MIL/uL — ABNORMAL LOW (ref 4.6–6.1)
RDW: 12.7 % (ref 11.6–14.4)
Seg Neut %: 61.5 %
WBC: 8 10*3/uL (ref 4.2–9.1)

## 2017-01-19 LAB — PROTIME-INR
INR: 2.1 — ABNORMAL HIGH (ref 0.9–1.1)
Protime: 24.4 s — ABNORMAL HIGH (ref 10.0–12.9)

## 2017-01-19 LAB — BASIC METABOLIC PANEL
Anion Gap: 14 (ref 7–16)
CO2: 22 mmol/L (ref 20–28)
Calcium: 9.1 mg/dL (ref 8.6–10.2)
Chloride: 104 mmol/L (ref 96–108)
Creatinine: 1.12 mg/dL (ref 0.67–1.17)
GFR,Black: 71 *
GFR,Caucasian: 61 *
Glucose: 155 mg/dL — ABNORMAL HIGH (ref 60–99)
Lab: 27 mg/dL — ABNORMAL HIGH (ref 6–20)
Potassium: 4.6 mmol/L (ref 3.3–5.1)
Sodium: 140 mmol/L (ref 133–145)

## 2017-01-19 LAB — HCT AND HGB
Hematocrit: 37 % — ABNORMAL LOW (ref 40–51)
Hemoglobin: 12.1 g/dL — ABNORMAL LOW (ref 13.7–17.5)

## 2017-01-19 LAB — MCHC: MCHC: 33 g/dL (ref 32–37)

## 2017-01-19 MED ORDER — IOHEXOL 350 MG/ML (OMNIPAQUE) IV SOLN *I*
1.0000 mL | Freq: Once | INTRAVENOUS | Status: AC
Start: 2017-01-19 — End: 2017-01-19
  Administered 2017-01-19: 129 mL via INTRAVENOUS

## 2017-01-19 NOTE — ED Provider Notes (Signed)
History     Chief Complaint   Patient presents with    Fall     HPI Comments: Patient is an 83 year old male with history of dementia, type 2 diabetes, A. fib on Coumadin who comes into the ED after fall.  States earlier today he was walking in his room, tripped and fell face first.  Admits to hitting his head but no loss consciousness.  States when he got up he realizes he spitting from his penis.  Denies any abdominal pain or penile pain.  No obvious lesions.  Denies any lightheadedness or dizziness.      History provided by:  Patient  Language interpreter used: No      Past Medical History:   Diagnosis Date    Arthritis     BPH (benign prostatic hypertrophy)     Dementia     mild    DM type 2 (diabetes mellitus, type 2)     Paget's disease of bone     Protein S deficiency     On chronic anticoagulation    Sinus bradycardia     Varicella         Past Surgical History:   Procedure Laterality Date    CHOLECYSTECTOMY      JOINT REPLACEMENT      KNEE REPLACEMENT      SKIN GRAFT      TONSILLECTOMY       Family History   Problem Relation Age of Onset    BRCA 1/2 Brother        Social History    reports that he has never smoked. He has never used smokeless tobacco. He reports that he does not drink alcohol or use illicit drugs. His sexual activity history is not on file.    Living Situation     Questions Responses    Patient lives with     Homeless     Caregiver for other family member     External Services     Employment     Domestic Violence Risk           Problem List     Patient Active Problem List   Diagnosis Code    Sinus Bradycardia I49.8    Dementia F06.8    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    Type II Diabetes Mellitus E11.9    Obesity E66.9    Thrombophlebitis Of Deep Vessels Of The Lower Extremity 451.1    Protein S Deficiency E88.09    Epistaxis R04.0    Urgency of urination R39.15    BPH (benign prostatic hyperplasia) N40.0    Penile lesion N48.9    Penile rash R21    Opiate analgesic  contract exists Z02.89    Lower urinary tract symptoms (LUTS) R39.9    CHF (congestive heart failure) I50.9    Paroxysmal atrial fibrillation I48.0    Elevated TSH R94.6       Review of Systems   Review of Systems   Genitourinary: Positive for hematuria.   All other systems reviewed and are negative.      Physical Exam     ED Triage Vitals   BP Heart Rate Heart Rate (via Pulse Ox) Resp Temp Temp src SpO2 (Retired) O2 Device O2 Flow Rate   01/19/17 0000 01/19/17 0000 -- 01/19/17 0000 01/19/17 0000 01/19/17 0000 01/19/17 0000 -- --   117/60 47  18 36.1 C (97 F) TEMPORAL 96 %        Weight  01/19/17 0000           87.1 kg (192 lb)                    Physical Exam   Constitutional: Vital signs are normal. He appears well-developed. No distress.   HENT:   Head: Normocephalic. Head is with abrasion.       Eyes: EOM are normal. Pupils are equal, round, and reactive to light.   Neck: Normal range of motion. Neck supple.   Cardiovascular: Normal rate, regular rhythm and normal heart sounds.    Pulmonary/Chest: Effort normal and breath sounds normal.   Abdominal: Soft. Normal appearance and bowel sounds are normal. There is no tenderness.   Genitourinary: Discharge (blood) found.   Genitourinary Comments: No lacerations but does have active bloody discharge   Musculoskeletal: Normal range of motion.        Hands:  Neurological: He is alert.   Skin: Skin is warm and dry.   Psychiatric: He has a normal mood and affect. His behavior is normal.   Nursing note and vitals reviewed.      Medical Decision Making        Initial Evaluation:  ED First Provider Contact     Date/Time Event User Comments    01/19/17 0015 ED First Provider Contact Pricilla Riffle Initial Face to Face Provider Contact          Patient seen by me on arrival date of 01/18/2017 at at time of arrival  11:53 PM.  Initial face to face evaluation time noted above may be discrepant due to patient acuity and delay in documentation.    Assessment:   83 y.o.male comes to the ED with Hematuria after fall    Differential Diagnosis includes contusion, fracture, internal bleeding                      Plan:   Labs  CT Head  CT a/p  CT abdomen and pelvis with contrast   Final Result   IMPRESSION:        No evidence of acute traumatic injury in the abdomen and pelvis.       43m nonobstructing right renal calculus. No hydronephrosis    bilaterally.       Possible changes of Paget's disease involving the L5 vertebral body    and left femur.       END OF IMPRESSION       I have personally reviewed the image(s) and the resident's    interpretation and agree with or edited the findings.       [facility] submits this DICOM format image data and final report to    the [facility] an independent secure electronic health    information exchange, on a reciprocally searchable basis (with    patient authorization) for a minimum of 12 months after exam date.             CT head without contrast   Final Result   IMPRESSION:         No acute intracranial abnormality.       END OF IMPRESSION       I have personally reviewed the image(s) and the resident's    interpretation and agree with or edited the findings.       [facility] submits this DICOM format image data and final report to    the [facility] an independent secure electronic health  information exchange, on a reciprocally searchable basis (with    patient authorization) for a minimum of 12 months after exam date.               HCT remained stable, patient still urinating without difficulty but does still have hematuria. Patient cleared for discharge and educated on increasing water intake. Also educated on possible urinary retention secondary to clots and to return to the ED.  Jacqulyn Bath, [place] was immediately available       Jacqulyn Bath, [place]  01/19/17 [phone]

## 2017-01-19 NOTE — ED Triage Notes (Signed)
Pt reports he was walking, and tripped on a rug and fell. Pt has a skin tear on L hand. Pt is also bleeding from his penis. Pt reports he hit his head, but denies any pain currently. Pt is on coumadin.     Triage Note   Jon [name] Jon [name]

## 2017-01-19 NOTE — ED Notes (Signed)
Discharge instructions and follow-up instructions reviewed with patient, patient verbalized understanding. IV removed, VS stable, patient ambulated with steady gait to lobby.

## 2017-01-19 NOTE — Telephone Encounter (Signed)
Called last night that pr had trauma to testicular area 5min ago and has noted hematuria for 15 minutes but has stopped, on warfarin--tried urinating--able to but bloody---referred to [facility] er for further eval and HH er notified pt would be coming in for eval

## 2017-01-19 NOTE — Discharge Instructions (Signed)
Discharge Instructions for Patients receiving   Contrast Medium    01/19/2017  3:59 AM    INFORMATION  I.V. contrast is eliminated through the kidneys.  Oral and rectal contrasts are not absorbed by the body and will be eliminated through the gastrointestinal tract.  Side effects and allergic reactions to contrast mediums are not common but can occur up to 48 hours after your scan.    INSTRUCTIONS   1. You will need to drink four 8 oz glasses of fluid over the next four hours, unless your medical condition does not allow you to do this.  Please speak with an [place] if you have any questions or concerns about this.  2. Mild headache may occur following contrast injection.  3. The kidneys excrete the contrast.  Your urine will NOT become discolored.  4. You may experience loose stools/diarrhea for approximately 24 hours after drinking oral contrast.  5. You may experience watery stool immediately after rectal contrast.  6.  If you are a DIABETIC and take Actos Plus Met, Actos Plus Met XR, Avandamet, Foramet, Glucophage, Glucophage XR, Glucovance, Glumetza, Janumet, Janumet XR, Jentadueto, [place], Kombiglyze XR, [place], [place], [place], [place], Xigduo XR or Metformin check with your doctor for management of your diabetes during this time.  Your doctor will have to address the need to hold this medication or the need for kidney function blood tests.   7.  There is no evidence that the contrast you have received would be harmful to your breastfed child.  If you choose, you may pump and discard your breast milk for the next 24 hours.    Delayed effects from contrast are not common.  However, notify your doctor IMMEDIATELY:    If nausea or vomiting occurs   If any itching, hives, wheezing, or rashes occur   Severe or throbbing headache    Monday - Friday 8am-5pm: For questions or concerns regarding your procedure please call [facility]  [phone].    After hours/Weekends:  [facility] patients may call [phone] and ask to speak with the Radiology resident on call.   [facility] patients may call [phone] to speak with the [place].  The Emergency Department is open 24 hours a day at [place] if emergency treatment is required.

## 2017-01-20 LAB — HOLD GREEN WITH GEL

## 2017-01-21 ENCOUNTER — Telehealth: Payer: Self-pay | Admitting: Primary Care

## 2017-01-21 NOTE — Telephone Encounter (Signed)
[name] called about Rafiq because he fell on Friday 01/19/17 and went to [facility] to be evaluated. He was D/C sometime in the morning patient had some CT's done/ a urine. Everything came back negative/normal. He's doing well today, but they will be leaving in the AM to go down [place] till May 1 or so, joan just wanted you to have a heads up about what was going on with Adah Salvage as he was told to follow up with his PCP. [name] stated that if you had any concerns feel free to contact her at anytime. Thanks

## 2017-01-21 NOTE — Telephone Encounter (Signed)
As long as he's not having any further urinary issues (hematuria), he can f/u when he gets back.  If he's still having urinary issues - we could try to have him see urology STAT?.... Let me know.

## 2017-01-21 NOTE — Telephone Encounter (Signed)
I spoke with [name] and Jon [name] has clear urine now, she will call PRN

## 2017-01-25 ENCOUNTER — Other Ambulatory Visit: Payer: Self-pay | Admitting: Primary Care

## 2017-01-25 ENCOUNTER — Other Ambulatory Visit: Payer: Self-pay | Admitting: Urology

## 2017-01-25 DIAGNOSIS — E119 Type 2 diabetes mellitus without complications: Secondary | ICD-10-CM

## 2017-02-01 ENCOUNTER — Emergency Department (HOSPITAL_COMMUNITY): Payer: Medicare Other

## 2017-02-01 ENCOUNTER — Emergency Department (HOSPITAL_COMMUNITY)
Admission: EM | Admit: 2017-02-01 | Discharge: 2017-02-01 | Disposition: A | Discharge status: Home / Self Care | Payer: Medicare Other | Attending: Emergency Medicine | Admitting: Emergency Medicine

## 2017-02-01 ENCOUNTER — Encounter (HOSPITAL_COMMUNITY): Payer: Self-pay

## 2017-02-01 ENCOUNTER — Telehealth (HOSPITAL_COMMUNITY): Payer: Self-pay | Admitting: *Deleted

## 2017-02-01 DIAGNOSIS — R5383 Other fatigue: Secondary | ICD-10-CM | POA: Diagnosis present

## 2017-02-01 DIAGNOSIS — Z7984 Long term (current) use of oral hypoglycemic drugs: Secondary | ICD-10-CM | POA: Diagnosis not present

## 2017-02-01 DIAGNOSIS — I509 Heart failure, unspecified: Secondary | ICD-10-CM | POA: Insufficient documentation

## 2017-02-01 DIAGNOSIS — E119 Type 2 diabetes mellitus without complications: Secondary | ICD-10-CM | POA: Diagnosis not present

## 2017-02-01 DIAGNOSIS — I951 Orthostatic hypotension: Secondary | ICD-10-CM

## 2017-02-01 HISTORY — DX: Heart failure, unspecified: I50.9

## 2017-02-01 HISTORY — DX: Type 2 diabetes mellitus without complications: E11.9

## 2017-02-01 LAB — BASIC METABOLIC PANEL
ANION GAP: 7 (ref 5–15)
BUN: 29 mg/dL — ABNORMAL HIGH (ref 6–20)
CHLORIDE: 107 mmol/L (ref 101–111)
CO2: 24 mmol/L (ref 22–32)
Calcium: 9.5 mg/dL (ref 8.9–10.3)
Creatinine, Ser: 1.47 mg/dL — ABNORMAL HIGH (ref 0.61–1.24)
GFR calc Af Amer: 50 mL/min — ABNORMAL LOW (ref >60)
GFR calc non Af Amer: 43 mL/min — ABNORMAL LOW (ref >60)
Glucose, Bld: 144 mg/dL — ABNORMAL HIGH (ref 65–99)
Potassium: 5 mmol/L (ref 3.5–5.1)
SODIUM: 138 mmol/L (ref 135–145)

## 2017-02-01 LAB — I-STAT TROPONIN, ED: TROPONIN I, POC: 0.01 ng/mL (ref 0.00–0.08)

## 2017-02-01 LAB — CBC
HCT: 37.3 % — ABNORMAL LOW (ref 39.0–52.0)
HEMOGLOBIN: 12.2 g/dL — AB (ref 13.0–17.0)
MCH: 29.7 pg (ref 26.0–34.0)
MCHC: 32.7 g/dL (ref 30.0–36.0)
MCV: 90.8 fL (ref 78.0–100.0)
Platelets: 138 10*3/uL — ABNORMAL LOW (ref 150–400)
RBC: 4.11 MIL/uL — AB (ref 4.22–5.81)
RDW: 13.8 % (ref 11.5–15.5)
WBC: 7 10*3/uL (ref 4.0–10.5)

## 2017-02-01 MED ORDER — SODIUM CHLORIDE 0.9 % IV BOLUS (SEPSIS)
500.0000 mL | Freq: Once | INTRAVENOUS | Status: AC
  Administered 2017-02-01: 500 mL via INTRAVENOUS

## 2017-02-01 MED ORDER — DOFETILIDE 250 MCG PO CAPS
250.0000 ug | ORAL_CAPSULE | Freq: Once | ORAL | Status: AC
Start: 2017-02-01 — End: 2017-02-01
  Administered 2017-02-01: 250 ug via ORAL
  Filled 2017-02-01: qty 1

## 2017-02-01 NOTE — Discharge Instructions (Signed)
Call your new cardiologist office on Monday to schedule an appointment to be seen sooner.only take your lisinopril once a day. Your creatinine today was elevated at 1.47. Follow-up with your doctor for this

## 2017-02-01 NOTE — Telephone Encounter (Signed)
Pt's wife called concerned about.  He will be a new pt for Dr Gala RomneyBensimhon in March.  She states pt ahs been doing ok BP running 112/70 until today.  Today pt c/o dizziness, increase fatigue and increase sob w/exertion, she checked his BP and it was 75/60 which is pretty low for pt.  Advised as he is new pt we are unfamiliar with pt's history and meds, as pt is symptomatic with hypotension should report to er for further eval, [facility].

## 2017-02-01 NOTE — ED Provider Notes (Signed)
[facility]-EMERGENCY DEPT Provider Note   CSN: [id number] Arrival date & time: 02/01/17  1452     History   Chief Complaint Chief Complaint  Patient presents with  . Fatigue  . Congestive Heart Failure    HPI Jose Hicks is a 83 y.o. male.  83 year old male presents complaining of becoming dizzy with standing. Symptoms have been for about a week now and have been associated with weakness. Recently had his blood pressure medications adjusted and had his dose of lisinopril doubled. Denies any associated chest pain or dyspnea. No recent history of volume loss. Denies any syncope. No treatment used prior to arrival.      Past Medical History:  Diagnosis Date  . CHF (congestive heart failure) (HCC)   . Diabetes mellitus without complication (HCC)     There are no active problems to display for this patient.   History reviewed. No pertinent surgical history.     Home Medications    Prior to Admission medications   Not on File    Family History History reviewed. No pertinent family history.  Social History Social History  Substance Use Topics  . Smoking status: Never Smoker  . Smokeless tobacco: Never Used  . Alcohol use No     Allergies   Patient has no allergy information on record.   Review of Systems Review of Systems  All other systems reviewed and are negative.    Physical Exam Updated Vital Signs BP 107/65 (BP Location: Left Arm)   Pulse 68   Temp 97.7 F (36.5 C) (Oral)   Resp 16   SpO2 99%   Physical Exam  Constitutional: He is oriented to person, place, and time. He appears well-developed and well-nourished.  Non-toxic appearance. No distress.  HENT:  Head: Normocephalic and atraumatic.  Eyes: Conjunctivae, EOM and lids are normal. Pupils are equal, round, and reactive to light.  Neck: Normal range of motion. Neck supple. No tracheal deviation present. No thyroid mass present.  Cardiovascular: Normal rate, regular rhythm and normal  heart sounds.  Exam reveals no gallop.   No murmur heard. Pulmonary/Chest: Effort normal and breath sounds normal. No stridor. No respiratory distress. He has no decreased breath sounds. He has no wheezes. He has no rhonchi. He has no rales.  Abdominal: Soft. Normal appearance and bowel sounds are normal. He exhibits no distension. There is no tenderness. There is no rebound and no CVA tenderness.  Musculoskeletal: Normal range of motion. He exhibits no edema or tenderness.  Neurological: He is alert and oriented to person, place, and time. He has normal strength. No cranial nerve deficit or sensory deficit. GCS eye subscore is 4. GCS verbal subscore is 5. GCS motor subscore is 6.  Skin: Skin is warm and dry. No abrasion and no rash noted.  Psychiatric: He has a normal mood and affect. His speech is normal and behavior is normal.  Nursing note and vitals reviewed.    ED Treatments / Results  Labs (all labs ordered are listed, but only abnormal results are displayed) Labs Reviewed  CBC - Abnormal; Notable for the following:       Result Value   RBC 4.11 (*)    Hemoglobin 12.2 (*)    HCT 37.3 (*)    Platelets 138 (*)    All other components within normal limits  BASIC METABOLIC PANEL  Rosezena Sensor-STAT TROPOININ, ED    EKG  EKG Interpretation  Date/Time:  Friday February 01 2017 15:02:17 EST Ventricular Rate:  70  PR Interval:  220 QRS Duration: 142 QT Interval:  426 QTC Calculation: 460 R Axis:   -44 Text Interpretation:  Sinus rhythm with 1st degree A-V block Left axis deviation Left ventricular hypertrophy with QRS widening Abnormal ECG Confirmed by Freida Busman  MD, Eldred Lievanos ([id number]) on 02/01/2017 5:56:34 PM       Radiology Dg Chest 2 View  Result Date: 02/01/2017 CLINICAL DATA:  Lightheadedness, dizziness, fatigue and history of heart failure. EXAM: CHEST  2 VIEW COMPARISON:  None. FINDINGS: The heart size is normal. The aorta is mildly ectatic. There is no evidence of pulmonary edema,  consolidation, pneumothorax, nodule or pleural fluid. The bony thorax demonstrates degenerative disc disease of the thoracic spine and mild generalized osteopenia. IMPRESSION: No active cardiopulmonary disease. Electronically Signed   By: Irish Lack M.D.   On: 02/01/2017 15:31    Procedures Procedures (including critical care time)  Medications Ordered in ED Medications - No data to display   Initial Impression / Assessment and Plan / ED Course  I have reviewed the triage vital signs and the nursing notes.  Pertinent labs & imaging results that were available during my care of the patient were reviewed by me and considered in my medical decision making (see chart for details).     Patient bolused with 500 mL of saline after he was found be orthostatic. This is likely due to his current medications. Patient will have his lisinopril decreased back to his prior dose only once a day. We'll continue with Lasix. And he will follow-up with his doctor.  Final Clinical Impressions(s) / ED Diagnoses   Final diagnoses:  None    New Prescriptions New Prescriptions   No medications on file     Lorre Nick, MD 02/01/17 2006

## 2017-02-01 NOTE — ED Triage Notes (Signed)
Pt complaining of lightheaded and dizziness when changing position from sitting to standing. Pt denies any chest pain or SOB at this time. Pt states feeling increasingly fatigued. Pt states hx of heart failure. Pt states recently moved here, not yet seen by cardiologist.

## 2017-02-04 ENCOUNTER — Encounter (HOSPITAL_COMMUNITY): Payer: Self-pay | Admitting: Internal Medicine

## 2017-02-04 ENCOUNTER — Ambulatory Visit (HOSPITAL_COMMUNITY)
Admission: RE | Admit: 2017-02-04 | Discharge: 2017-02-04 | Disposition: A | Discharge status: Home / Self Care | Payer: Medicare Other | Source: Ambulatory Visit | Attending: Internal Medicine | Admitting: Internal Medicine

## 2017-02-04 VITALS — BP 116/66 | HR 78 | Ht 71.0 in | Wt 198.0 lb

## 2017-02-04 DIAGNOSIS — F0390 Unspecified dementia without behavioral disturbance: Secondary | ICD-10-CM | POA: Insufficient documentation

## 2017-02-04 DIAGNOSIS — I491 Atrial premature depolarization: Secondary | ICD-10-CM | POA: Insufficient documentation

## 2017-02-04 DIAGNOSIS — E119 Type 2 diabetes mellitus without complications: Secondary | ICD-10-CM | POA: Diagnosis not present

## 2017-02-04 DIAGNOSIS — I429 Cardiomyopathy, unspecified: Secondary | ICD-10-CM | POA: Diagnosis not present

## 2017-02-04 DIAGNOSIS — I480 Paroxysmal atrial fibrillation: Secondary | ICD-10-CM | POA: Diagnosis not present

## 2017-02-04 DIAGNOSIS — Z8673 Personal history of transient ischemic attack (TIA), and cerebral infarction without residual deficits: Secondary | ICD-10-CM | POA: Diagnosis not present

## 2017-02-04 DIAGNOSIS — Z841 Family history of disorders of kidney and ureter: Secondary | ICD-10-CM | POA: Insufficient documentation

## 2017-02-04 DIAGNOSIS — Z794 Long term (current) use of insulin: Secondary | ICD-10-CM | POA: Diagnosis not present

## 2017-02-04 DIAGNOSIS — I5022 Chronic systolic (congestive) heart failure: Secondary | ICD-10-CM | POA: Diagnosis not present

## 2017-02-04 DIAGNOSIS — Z79899 Other long term (current) drug therapy: Secondary | ICD-10-CM | POA: Insufficient documentation

## 2017-02-04 DIAGNOSIS — I2510 Atherosclerotic heart disease of native coronary artery without angina pectoris: Secondary | ICD-10-CM | POA: Insufficient documentation

## 2017-02-04 DIAGNOSIS — Z8249 Family history of ischemic heart disease and other diseases of the circulatory system: Secondary | ICD-10-CM | POA: Diagnosis not present

## 2017-02-04 DIAGNOSIS — Z7901 Long term (current) use of anticoagulants: Secondary | ICD-10-CM | POA: Insufficient documentation

## 2017-02-04 DIAGNOSIS — R42 Dizziness and giddiness: Secondary | ICD-10-CM | POA: Insufficient documentation

## 2017-02-04 LAB — COMPREHENSIVE METABOLIC PANEL
ALBUMIN: 3.5 g/dL (ref 3.5–5.0)
ALT: 17 U/L (ref 17–63)
AST: 22 U/L (ref 15–41)
Alkaline Phosphatase: 50 U/L (ref 38–126)
Anion gap: 8 (ref 5–15)
BUN: 21 mg/dL — ABNORMAL HIGH (ref 6–20)
CHLORIDE: 106 mmol/L (ref 101–111)
CO2: 23 mmol/L (ref 22–32)
CREATININE: 1.36 mg/dL — AB (ref 0.61–1.24)
Calcium: 9.3 mg/dL (ref 8.9–10.3)
GFR calc non Af Amer: 48 mL/min — ABNORMAL LOW (ref >60)
GFR, EST AFRICAN AMERICAN: 55 mL/min — AB (ref >60)
Glucose, Bld: 131 mg/dL — ABNORMAL HIGH (ref 65–99)
Potassium: 4.9 mmol/L (ref 3.5–5.1)
SODIUM: 137 mmol/L (ref 135–145)
Total Bilirubin: 0.7 mg/dL (ref 0.3–1.2)
Total Protein: 6.8 g/dL (ref 6.5–8.1)

## 2017-02-04 LAB — CBC
HCT: 36.7 % — ABNORMAL LOW (ref 39.0–52.0)
HEMOGLOBIN: 12 g/dL — AB (ref 13.0–17.0)
MCH: 29.5 pg (ref 26.0–34.0)
MCHC: 32.7 g/dL (ref 30.0–36.0)
MCV: 90.2 fL (ref 78.0–100.0)
PLATELETS: 159 10*3/uL (ref 150–400)
RBC: 4.07 MIL/uL — AB (ref 4.22–5.81)
RDW: 13.8 % (ref 11.5–15.5)
WBC: 6.7 10*3/uL (ref 4.0–10.5)

## 2017-02-04 LAB — PROTIME-INR
INR: 2.06
Prothrombin Time: 23.5 seconds — ABNORMAL HIGH (ref 11.4–15.2)

## 2017-02-04 MED ORDER — FUROSEMIDE 20 MG PO TABS: 10.0000 mg | ORAL_TABLET | ORAL | Status: DC | PRN

## 2017-02-04 MED ORDER — APIXABAN 5 MG PO TABS: 5.0000 mg | ORAL_TABLET | Freq: Two times a day (BID) | ORAL | 3 refills | Status: AC

## 2017-02-04 NOTE — Patient Instructions (Signed)
Stop Warfarin  Start Eliquis 5 mg Twice daily WE WILL CALL YOU AND LET YOU KNOW WHEN TO START THIS  Change Furosemide to 10 mg AS NEEDED FOR WEIGHT OF 198 LB OR MORE  Labs today  Your physician has requested that you have a cardiac MRI. Cardiac MRI uses a computer to create images of your heart as its beating, producing both still and moving pictures of your heart and major blood vessels. For further information please visit [email or web address]. Please follow the instruction sheet given to you today for more information.  Your physician recommends that you schedule a follow-up appointment in: 6 weeks

## 2017-02-04 NOTE — Progress Notes (Signed)
ADVANCED HF CLINIC CONSULT NOTE  Referring Physician: Dr. Andrena Mews Primary Care: Dr. Clarise Cruz [facility]) Primary Cardiologist: New (Saraphina Lauderbaugh)  HPI:  Mr. 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   Diagnosed for HF for the first time in November 2017 in [place]. Echo at time EF 25%. Also found to be in new onset AF at that time too. Cath 11/15/2016 with mild non-obstructive CAD with 40% lesion in mLCX only. Thought to possibly have AF-related cardiomyopathy though rates were not elevated at time of presentation. Had initial DC-CV but reverted to AF after a short period. Readmitted to the hospital in 12/17 for Tikosyn load. (Had to stop Aricept at the time due to incompatibility with Tikosyn).   He and his wife moved from [place] to [place] to be with her daughter.  On Friday became dizzy and weak and went to ER. Creatinine went from 1.07 to 1.47. BP 107/65. Gave 500cc NS. Decreased lisinopril but kept lasix at 10 daily.   He is a former golf pro. Says he can get around the house but gets lightheaded and fatigued when swinging a golf club. Gets dizzy any time he stands. No edema, orthopnea or PND. Weight stable at home at 194. Six months ago weight was 225. No CP or fluttering. Wife says dementia is getting worse after stopping Aricept. Mild snoring. Was previously on CPAP but resolved with weight loss.  He has 15 sibling. One brother had a heart/kidney transplant at [facility] in [place]. No other family members with heart failure.      Review of Systems: [y] = yes, [ ]  = no   General: Weight gain [ ] ; Weight loss [ ] ; Anorexia [ ] ; Fatigue [ ] ; Fever [ ] ; Chills [ ] ; Weakness [ ]   Cardiac: Chest pain/pressure [ ] ; Resting SOB [ ] ; Exertional SOB [ ] ; Orthopnea [ ] ; Pedal Edema [ ] ; Palpitations [ ] ; Syncope [ ] ; Presyncope [ ] ; Paroxysmal nocturnal dyspnea[ ]     Pulmonary: Cough [ ] ; Wheezing[ ] ; Hemoptysis[ ] ; Sputum [ ] ; Snoring [ ]   GI: Vomiting[ ] ; Dysphagia[ ] ; Melena[ ] ; Hematochezia [ ] ; Heartburn[ ] ; Abdominal pain [ ] ; Constipation [ ] ; Diarrhea [ ] ; BRBPR [ ]   GU: Hematuria[ ] ; Dysuria [ ] ; Nocturia[ ]   Vascular: Pain in legs with walking [ ] ; Pain in feet with lying flat [ ] ; Non-healing sores [ ] ; Stroke [ ] ; TIA [ ] ; Slurred speech [ ] ;  Neuro: Headaches[ ] ; Vertigo[ ] ; Seizures[ ] ; Paresthesias[ ] ;Blurred vision [ ] ; Diplopia [ ] ; Vision changes [ ]   Ortho/Skin: Arthritis [ ] ; Joint pain [ ] ; Muscle pain [ ] ; Joint swelling [ ] ; Back Pain [ ] ; Rash [ ]   Psych: Depression[ ] ; Anxiety[ ]   Heme: Bleeding problems [ ] ; Clotting disorders [ ] ; Anemia [ ]   Endocrine: Diabetes [ ] ; Thyroid dysfunction[ ]    Past Medical History:  Diagnosis Date  . CHF (congestive heart failure) (HCC)   . Diabetes mellitus without complication [facility])     Current Outpatient Prescriptions  Medication Sig Dispense Refill  . acetaminophen (TYLENOL) 500 MG tablet Take by mouth.    . Blood Glucose Calibration (OT ULTRA/FASTTK CNTRL SOLN) SOLN As directed    . Blood Glucose Monitoring Suppl (FREESTYLE LITE) DEVI Use as instructed    . Cholecalciferol (VITAMIN D3) 1000 units CAPS Take 1 capsule  by mouth daily.    . Cinnamon 500 MG capsule Take 500 mg by mouth daily.    . Coenzyme Q-10 200 MG CAPS Take 1 capsule by mouth daily.    Jose Hicks. dofetilide (TIKOSYN) 250 MCG capsule Take 250 mcg by mouth 2 (two) times daily.    . finasteride (PROSCAR) 5 MG tablet Take 5 mg by mouth daily.    . furosemide (LASIX) 20 MG tablet Take 10 mg by mouth.    Jose Hicks. glucose blood (FREESTYLE LITE) test strip TO BE USED TWO TIMES DAILY TO TEST BLOOD SUGAR    . HYDROcodone-acetaminophen (VICODIN) 5-500 MG tablet 1-2 po q 6 hours prn pain MDD=6    . Insulin Pen Needle (PEN NEEDLES) 32G X 4 MM MISC Use 1 times a day as instructed with Victoza pens    . ipratropium (ATROVENT) 0.03 % nasal spray 2  sprays by Each Nare route 3 times daily    . liraglutide (VICTOZA) 18 MG/3ML SOPN Inject 1.8 mg into the skin daily.    Jose Hicks. lisinopril (PRINIVIL,ZESTRIL) 2.5 MG tablet Take 2.5 mg by mouth daily.    . meloxicam (MOBIC) 7.5 MG tablet Take 7.5 mg by mouth daily.    . memantine (NAMENDA) 10 MG tablet Take 10 mg by mouth 2 (two) times daily.    . metFORMIN (GLUCOPHAGE) 1000 MG tablet Take 1,000 mg by mouth 2 (two) times daily with a meal.    . metoprolol succinate (TOPROL-XL) 25 MG 24 hr tablet Take 25 mg by mouth daily.    . Mirabegron (MYRBETRIQ PO) Take 1 tablet by mouth daily.    . Multiple Vitamin (MULTIVITAMIN WITH MINERALS) TABS tablet Take 1 tablet by mouth daily.    Jose Hicks. omega-3 acid ethyl esters (LOVAZA) 1 g capsule Take 1 g by mouth daily.    . rosuvastatin (CRESTOR) 10 MG tablet Take 10 mg by mouth every evening.    Jose Hicks. spironolactone (ALDACTONE) 25 MG tablet Take 25 mg by mouth daily.    Jose Hicks. warfarin (COUMADIN) 4 MG tablet Take 2-4 mg by mouth See admin instructions. Take 4mg  on Mondays and Thursdays then take 2mg  rest of days     No current facility-administered medications for this encounter.     No Known Allergies    Social History   Social History  . Marital status: Married    Spouse name: N/A  . Number of children: N/A  . Years of education: N/A   Occupational History  . Not on file.   Social History Main Topics  . Smoking status: Never Smoker  . Smokeless tobacco: Never Used  . Alcohol use No  . Drug use: No  . Sexual activity: Not on file   Other Topics Concern  . Not on file   Social History Narrative  . No narrative on file   Family history:  15 siblings. --1 with heart/kidney transplant --1 with pacemaker --No family h/o SCD or premature CAD  Vitals:   02/04/17 1057  BP: 116/66  Pulse: 78  SpO2: 98%  Weight: 198 lb (89.8 kg)  Height: 5\' 11"  (1.803 m)    Orthostatics done personally Sitting 92/64 Standing 94/64  PHYSICAL EXAM: General:   Elderly Well appearing. No respiratory difficulty HEENT: normal Neck: supple. no JVD. Carotids 2+ bilat; no bruits. No lymphadenopathy or thryomegaly appreciated. Cor: PMI laterally displaced. Regular rate & rhythm. No rubs, gallops or murmurs. Lungs: clear Abdomen: soft, nontender, nondistended. No hepatosplenomegaly. No bruits or masses. Good bowel sounds.  Extremities: no cyanosis, clubbing, rash, edema Neuro: alert & oriented x 3, cranial nerves grossly intact. moves all 4 extremities w/o difficulty. Affect pleasant.  ECG: NSR 66 1AVB  Non-specific T-wave flattening. QTC    ASSESSMENT & PLAN: 1. Chronic systolic HF - new diagnosis 11/17. EF 25%. Cath mild non-obstructive CAD. --unclear etiology. ? Related to AF but rate at presentation was not fast. No antecedent viral syndrome. No ETOH, BP ok --NYHA III in setting of marked volume depletion --stop lasix. Use only if weight > 198 --continue low-dose lisinopril and Toprol as tolerated --proceed with cMRI --see back in several weeks. If still with advanced symptoms once volume status improved may need RHC  2. PAF --Recent onset.  --Maintaining NSR on Tikosyn, QTc ok today. --Will switch warfarin to Eliquis  3. Dementia --Off Aricept due to risk of QT prolongation with Tikosyn. I reviewed this at length with our Pharmacist. The interaction is a level D sow we could use together carefully if needed.  --Will wait for him to see Dr. Richardean Chimera tomorrow. If she feels he will benefit from Aricept will add back slowly and watch QT interval. Could also consider switching Tikosyn to amio if absolutely necessary - though amio probably not as well tolerated as Tikosyn.  Labs today. RTC in 6 weeks.   Inas Avena,MD 8:29 PM

## 2017-02-05 ENCOUNTER — Ambulatory Visit (INDEPENDENT_AMBULATORY_CARE_PROVIDER_SITE_OTHER): Payer: Medicare Other | Admitting: Neurology

## 2017-02-05 ENCOUNTER — Encounter: Payer: Self-pay | Admitting: Neurology

## 2017-02-05 VITALS — BP 90/44 | HR 78 | Resp 18 | Ht 71.0 in | Wt 199.0 lb

## 2017-02-05 DIAGNOSIS — G3184 Mild cognitive impairment, so stated: Secondary | ICD-10-CM

## 2017-02-05 DIAGNOSIS — I5041 Acute combined systolic (congestive) and diastolic (congestive) heart failure: Secondary | ICD-10-CM | POA: Diagnosis not present

## 2017-02-05 DIAGNOSIS — I951 Orthostatic hypotension: Secondary | ICD-10-CM

## 2017-02-05 DIAGNOSIS — I480 Paroxysmal atrial fibrillation: Secondary | ICD-10-CM

## 2017-02-05 NOTE — Patient Instructions (Signed)
Atrial Fibrillation °Introduction °Atrial fibrillation is a type of heartbeat that is irregular or fast (rapid). If you have this condition, your heart keeps quivering in a weird (chaotic) way. This condition can make it so your heart cannot pump blood normally. Having this condition gives a person more risk for stroke, heart failure, and other heart problems. There are different types of atrial fibrillation. Talk with your doctor to learn about the type that you have. °Follow these instructions at home: °· Take over-the-counter and prescription medicines only as told by your doctor. °· If your doctor prescribed a blood-thinning medicine, take it exactly as told. Taking too much of it can cause bleeding. If you do not take enough of it, you will not have the protection that you need against stroke and other problems. °· Do not use any tobacco products. These include cigarettes, chewing tobacco, and e-cigarettes. If you need help quitting, ask your doctor. °· If you have apnea (obstructive sleep apnea), manage it as told by your doctor. °· Do not drink alcohol. °· Do not drink beverages that have caffeine. These include coffee, soda, and tea. °· Maintain a healthy weight. Do not use diet pills unless your doctor says they are safe for you. Diet pills may make heart problems worse. °· Follow diet instructions as told by your doctor. °· Exercise regularly as told by your doctor. °· Keep all follow-up visits as told by your doctor. This is important. °Contact a doctor if: °· You notice a change in the speed, rhythm, or strength of your heartbeat. °· You are taking a blood-thinning medicine and you notice more bruising. °· You get tired more easily when you move or exercise. °Get help right away if: °· You have pain in your chest or your belly (abdomen). °· You have sweating or weakness. °· You feel sick to your stomach (nauseous). °· You notice blood in your throw up (vomit), poop (stool), or pee (urine). °· You are  short of breath. °· You suddenly have swollen feet and ankles. °· You feel dizzy. °· Your suddenly get weak or numb in your face, arms, or legs, especially if it happens on one side of your body. °· You have trouble talking, trouble understanding, or both. °· Your face or your eyelid droops on one side. °These symptoms may be an emergency. Do not wait to see if the symptoms will go away. Get medical help right away. Call your local emergency services (911 in the [place] Do not drive yourself to the hospital.  °This information is not intended to replace advice given to you by your health care provider. Make sure you discuss any questions you have with your health care provider. °Document Released: 09/11/2008 Document Revised: 05/10/2016 Document Reviewed: 03/30/2015 °© 2017 [employer] ° °

## 2017-02-05 NOTE — Progress Notes (Signed)
Provider:  Melvyn Novas, M D  Referring Provider: Clarise Cruz, MD Primary Care Physician:  Pcp Not In System  Chief Complaint  Patient presents with  . New Patient (Initial Visit)    Rm 11. Patient was taken off of Aricept by Cardiology to start a new medication. Family would like to see if patient should start back on Aricept?     HPI:  Jose Hicks is a 83 y.o. male  Is seen here as a referral  from Dr. Jimmie Molly for a  transfer of care for dementia.    Jose Hicks is a very young appearing 83 year old gentleman referred here by [facility] Dr. Jimmie Molly. He has a history of newly diagnosed atrial fibrillation as of November 2017 mild nonobstructive  coronary artery disease, and congestive heart failure with an ejection fraction of only 25%. He had neither a heart attack nor viral infection, nor was he treated with medications that are known to provoke cardiomyopathy. He has never been on immunosuppressant medications. 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 He also carries a diagnosis of dementia but given that he had a single stroke this is not an Alzheimer or progressive process. He scored on the Mini-Mental Status Examination today 24 out of 30 points which does not happen after a 25 year history of dementia. He certainly suffered some cognitive impairment related to his stroke but that does not constitute a progressive neurodegenerative disorder. This is vascular dementia. He underwent detailed neuropsychological testing and his wife will provide me with report copies. I think he clearly has short-term memory loss - to my surprise he has no visual spatial difficulties and  he does not appear depressed, anxious or drowsy.  He is currently in [place] followed by Dr. Sampson Goon in the heart failure clinic. He has further history of osteoarthritis, again atrial fibrillation,  mild nonobstructive coronary artery disease by catheterization which cannot explain the degree of cardiomyopathy. Congestive heart failure following cardiomyopathy with an ejection fraction of 25%, hyperlipidemia, diabetes mellitus, DVT, Paget's disease, protein S deficiency, spinal stenosis and a posterior stroke in 1993.   Review of Systems: Out of a complete 14 system review, the patient complains of only the following symptoms, and all other reviewed systems are negative. Cognitive impairment: He is currently taking Namenda, after his cardiac condition was diagnosed he was taken off Aricept. He has been placed on tic is seen. He is also on Claforan, multivitamin, Tikosyn , metoprolol, metformin, Victoza , he is no longer on Lasix, no longer on donepezil, but still on Myrbetriq .    Social History   Social History  . Marital status: Married    Spouse name: N/A  . Number of children: N/A  . Years of education: N/A   Occupational History  . Retired     Social History Main Topics  . Smoking status: Never Smoker  . Smokeless tobacco: Never Used  . Alcohol use No  . Drug use: No  . Sexual activity: Not on file   Other Topics Concern  . Not on file   Social History Narrative   Drinks 1-2 caffeine drinks a day     Family History  Problem Relation Age of Onset  . Heart disease Mother   . CAD Father     Past Medical History:  Diagnosis Date  . A-fib (HCC)   . Arthritis   . CHF (  congestive heart failure) (HCC)   . Dementia 1993  . Diabetes mellitus without complication [facility](HCC)     Past Surgical History:  Procedure Laterality Date  . CHOLECYSTECTOMY    . KNEE SURGERY    . SKIN GRAFT    . TONSILLECTOMY      Current Outpatient Prescriptions  Medication Sig Dispense Refill  . acetaminophen (TYLENOL) 500 MG tablet Take by mouth.    [name]. apixaban (ELIQUIS) 5 MG TABS tablet Take 1 tablet (5 mg total) by mouth 2 (two) times daily. 60 tablet 3  . Blood Glucose Calibration (OT  ULTRA/FASTTK CNTRL SOLN) SOLN As directed    . Blood Glucose Monitoring Suppl (FREESTYLE LITE) DEVI Use as instructed    . Cholecalciferol (VITAMIN D3) 1000 units CAPS Take 1 capsule by mouth daily.    . Cinnamon 500 MG capsule Take 500 mg by mouth daily.    . Coenzyme Q-10 200 MG CAPS Take 1 capsule by mouth daily.    [name]. dofetilide (TIKOSYN) 250 MCG capsule Take 250 mcg by mouth 2 (two) times daily.    . finasteride (PROSCAR) 5 MG tablet Take 5 mg by mouth daily.    . furosemide (LASIX) 20 MG tablet Take 0.5 tablets (10 mg total) by mouth as needed (for weight 198 or greater). 30 tablet   . glucose blood (FREESTYLE LITE) test strip TO BE USED TWO TIMES DAILY TO TEST BLOOD SUGAR    . Insulin Pen Needle (PEN NEEDLES) 32G X 4 MM MISC Use 1 times a day as instructed with Victoza pens    . liraglutide (VICTOZA) 18 MG/3ML SOPN Inject 1.8 mg into the skin daily.    [name]. lisinopril (PRINIVIL,ZESTRIL) 2.5 MG tablet Take 2.5 mg by mouth daily.    . memantine (NAMENDA) 10 MG tablet Take 10 mg by mouth 2 (two) times daily.    . metFORMIN (GLUCOPHAGE) 1000 MG tablet Take 1,000 mg by mouth 2 (two) times daily with a meal.    . metoprolol succinate (TOPROL-XL) 25 MG 24 hr tablet Take 25 mg by mouth daily.    . Mirabegron (MYRBETRIQ PO) Take 1 tablet by mouth daily.    . Multiple Vitamin (MULTIVITAMIN WITH MINERALS) TABS tablet Take 1 tablet by mouth daily.    [name]. omega-3 acid ethyl esters (LOVAZA) 1 g capsule Take 1 g by mouth daily.    . rosuvastatin (CRESTOR) 10 MG tablet Take 10 mg by mouth every evening.    [name]. spironolactone (ALDACTONE) 25 MG tablet Take 25 mg by mouth daily.     No current facility-administered medications for this visit.     Allergies as of 02/05/2017 - Review Complete 02/05/2017  Allergen Reaction Noted  . Bee venom Other (See Comments) 10/12/2013    Vitals: BP (!) 90/44   Pulse 78   Resp 18   Ht 5\' 11"  (1.803 m)   Wt 199 lb (90.3 kg)   BMI 27.75 kg/m  Last Weight:  Wt  Readings from Last 1 Encounters:  02/05/17 199 lb (90.3 kg)   Last Height:   Ht Readings from Last 1 Encounters:  02/05/17 5\' 11"  (1.803 m)    Physical exam: Jose Hicks is a Caucasian left-handed gentleman.  General: The patient is awake, alert and appears not in acute distress. The patient is well groomed. Head: Normocephalic, atraumatic. Neck is supple. Mallampati 4, neck circumference: 17  Cardiovascular:  Regular rate and rhythm  without  murmurs or carotid bruit, and without distended neck veins.  Respiratory: Lungs are clear to auscultation. Skin:  Without evidence of edema, or rash Trunk: BMI is  elevated and patient  has normal posture.  Neurologic exam : The patient is awake and alert, oriented to place and time.  Memory subjective  described as impaired - short term memory impairment .   MMSE - Mini Mental State Exam 02/05/2017  Orientation to time 5  Orientation to Place 3  Registration 3  Attention/ Calculation 5  Recall 0  Language- name 2 objects 2  Language- repeat 1  Language- follow 3 step command 3  Language- read & follow direction 1  Write a sentence 1  Copy design 0  Total score 24   There is a normal attention span & concentration ability. Speech is fluent without  dysarthria, dysphonia or aphasia. Mood and affect are appropriate.  Cranial nerves: Pupils are equal and briskly reactive to light. Funduscopic exam without  evidence of pallor or edema. Extraocular movements  in vertical and horizontal planes intact and without nystagmus.  Visual fields by finger perimetry are intact. Hearing to finger rub intact.  Facial sensation intact to fine touch. Facial motor strength on the right decreased.   tongue and uvula move midline. Tongue protrusion into either cheek is normal. Shoulder shrug is normal.   Motor exam:   Normal tone ,muscle bulk and symmetric  strength in all extremities. His left arm is clumsier- he ha a neck injury 1990.  Sensory:  Fine  touch, pinprick and vibration were normal in both upper extremities. Both feet have less vibration sensation, Coordination: Rapid alternating movements in the fingers/hands were normal.  Finger-to-nose maneuver  normal without evidence of ataxia, dysmetria or tremor.  Gait and station: Patient walks without assistive device and is able unassisted to climb up to the exam table. He does not have any arm swing in his left upper extremity/   Strength within normal limits. Stance is stable and normal. Deep tendon reflexes: in the  upper and lower extremities are symmetric and intact. Babinski maneuver response is downgoing.   Assessment:  After physical and neurologic examination, review of laboratory studies, imaging, neurophysiology testing and pre-existing records, assessment is that of :   Mr. Eulis Foster presents with short-term memory loss, this is not a feature of global amnesia, and I doubt that is part of his stroke syndrome. He has not had any imaging studies and neurologic workups since he was granted disability in 1993. A neuropsychological workup speaks of suspected posterior cerebral artery stroke, but at the time and imaged to confirm this was not obtained a CT was negative. In addition, there is certainly the possibility that a separate course of short-term memory loss has set in. For someone diagnosed with vascular dementia 25 years ago this patient is doing remarkably well.  Borderline not know about his history I would think that this patient may now develop age-related memory changes and that his cardiac condition-cardiomyopathy with an ejection fraction of 25% may be contributing to it. He has very low blood pressures he often is slightly fainty, and this all can contribute to cognitive impairment. While I cannot rule out that she could develop Alzheimer's disease in the future at this time I'm not able to make that diagnosis. My goal is to obtain an MRI of the brain without contrast, I  would maintain him on Namenda and all further medications have to be discussed with his cardio specialists.   Porfirio Mylar Rayshaun Needle MD 02/05/2017

## 2017-02-07 ENCOUNTER — Telehealth (HOSPITAL_COMMUNITY): Payer: Self-pay | Admitting: *Deleted

## 2017-02-07 NOTE — Telephone Encounter (Signed)
Spoke with Patient's wife this morning regarding Eliquis and I told her he was able to go ahead and start it this morning.  She is aware and will have patient start medication this morning.

## 2017-02-12 ENCOUNTER — Telehealth: Payer: Self-pay | Admitting: Neurology

## 2017-02-12 NOTE — Telephone Encounter (Signed)
I spoke with the patient wife and informed her that I got the MRI approved. She wanted to know what the status was for the sleep study..[name]

## 2017-02-14 ENCOUNTER — Telehealth: Payer: Self-pay | Admitting: Internal Medicine

## 2017-02-14 NOTE — Telephone Encounter (Signed)
Called the patient to find out when he wanted the cardiac MRI scheduled.  He would like an am appointment.  Message to [place].

## 2017-02-15 ENCOUNTER — Encounter: Payer: Self-pay | Admitting: Neurology

## 2017-02-15 ENCOUNTER — Encounter: Payer: Self-pay | Admitting: Internal Medicine

## 2017-02-15 ENCOUNTER — Telehealth: Payer: Self-pay | Admitting: Internal Medicine

## 2017-02-15 NOTE — Telephone Encounter (Signed)
Called patient and left a voicemail for the patient to call me if he has any questions about his cardiac MRI.  Mailed letter and calendar to the patient.

## 2017-02-19 ENCOUNTER — Ambulatory Visit
Admission: RE | Admit: 2017-02-19 | Discharge: 2017-02-19 | Disposition: A | Discharge status: Home / Self Care | Payer: Medicare Other | Source: Ambulatory Visit | Attending: Neurology | Admitting: Neurology

## 2017-02-19 ENCOUNTER — Telehealth: Payer: Self-pay | Admitting: Primary Care

## 2017-02-19 ENCOUNTER — Ambulatory Visit: Payer: Self-pay

## 2017-02-19 DIAGNOSIS — I5041 Acute combined systolic (congestive) and diastolic (congestive) heart failure: Secondary | ICD-10-CM

## 2017-02-19 DIAGNOSIS — I951 Orthostatic hypotension: Secondary | ICD-10-CM

## 2017-02-19 DIAGNOSIS — G3184 Mild cognitive impairment, so stated: Secondary | ICD-10-CM | POA: Diagnosis not present

## 2017-02-19 DIAGNOSIS — I480 Paroxysmal atrial fibrillation: Secondary | ICD-10-CM

## 2017-02-19 NOTE — Telephone Encounter (Signed)
I spoke with Jon [name] and Jon [name] was seen by cardiologist down in [place]. And was taken off of coumadin and put on Eliquis 5 mg twice a day, mobic stopped along with furosemide and Jon [name] stated he was put on other meds and will have the cardiologist in [place] send reports to Dr. Farrell Skene.

## 2017-02-21 ENCOUNTER — Telehealth (HOSPITAL_COMMUNITY): Payer: Self-pay | Admitting: *Deleted

## 2017-02-21 ENCOUNTER — Telehealth: Payer: Self-pay

## 2017-02-21 ENCOUNTER — Telehealth: Payer: Self-pay | Admitting: Internal Medicine

## 2017-02-21 DIAGNOSIS — I5022 Chronic systolic (congestive) heart failure: Secondary | ICD-10-CM

## 2017-02-21 NOTE — Telephone Encounter (Signed)
Returning call to the patients wife regarding MRI.  Left my name and phone number.

## 2017-02-21 NOTE — Telephone Encounter (Signed)
Patient's daughter called in reporting that he just wasn't feeling well, very fatigued, and decreased energy.  I called and spoke with patient' wife and she reporting the same symptoms.  Patient weight has decreased to 193 lbs, BP is 107/59 sitting and dropped to 78/61 when standing yesterday.  Some dizziness and wife has noticed his lips have a purple/blue tent but no shortness of breath during those times. Wife confirmed patient is not taking lasix.   Spoke with Dr. Gala RomneyBensimhon and he advises patient to stop spironolactone and come in for lab work next week. Patient is agreeable with plan.  Appointment made, lab orders placed, and medication list updated.

## 2017-02-21 NOTE — Telephone Encounter (Signed)
-----   Message from Melvyn Novasarmen Dohmeier, MD sent at 02/20/2017  5:30 PM EST ----- Brain atrophy noted in peri-sylvian area is associated with short term memory dysfunction. This is commonly noted with age.  No stroke, tumor or injury noted. CD

## 2017-02-21 NOTE — Telephone Encounter (Signed)
I called pt. I spoke to him and his wife and advised him that Dr. Vickey Hugerohmeier reviewed his MRI results. I advised them that Dr. Vickey Hugerohmeier noted brain atrophy in an area of the brain associated with short term memory dysfunction. This is commonly noted with age, but no stroke, tumor, or injury was noted. Pt will proceed with sleep study. Pt verbalized understanding of results. Pt had no questions at this time but was encouraged to call back if questions arise.

## 2017-02-25 ENCOUNTER — Encounter (HOSPITAL_COMMUNITY): Payer: Medicare Other | Admitting: Internal Medicine

## 2017-02-25 ENCOUNTER — Ambulatory Visit (HOSPITAL_COMMUNITY)
Admission: RE | Admit: 2017-02-25 | Discharge: 2017-02-25 | Disposition: A | Discharge status: Home / Self Care | Payer: Medicare Other | Source: Ambulatory Visit | Attending: Internal Medicine | Admitting: Internal Medicine

## 2017-02-25 DIAGNOSIS — I5022 Chronic systolic (congestive) heart failure: Secondary | ICD-10-CM | POA: Insufficient documentation

## 2017-02-25 LAB — BASIC METABOLIC PANEL
ANION GAP: 6 (ref 5–15)
BUN: 26 mg/dL — AB (ref 6–20)
CO2: 25 mmol/L (ref 22–32)
Calcium: 9.4 mg/dL (ref 8.9–10.3)
Chloride: 105 mmol/L (ref 101–111)
Creatinine, Ser: 1.32 mg/dL — ABNORMAL HIGH (ref 0.61–1.24)
GFR calc Af Amer: 57 mL/min — ABNORMAL LOW (ref >60)
GFR calc non Af Amer: 49 mL/min — ABNORMAL LOW (ref >60)
GLUCOSE: 153 mg/dL — AB (ref 65–99)
POTASSIUM: 4.6 mmol/L (ref 3.5–5.1)
Sodium: 136 mmol/L (ref 135–145)

## 2017-02-25 LAB — CBC
HEMATOCRIT: 37.6 % — AB (ref 39.0–52.0)
HEMOGLOBIN: 12.5 g/dL — AB (ref 13.0–17.0)
MCH: 30.3 pg (ref 26.0–34.0)
MCHC: 33.2 g/dL (ref 30.0–36.0)
MCV: 91.3 fL (ref 78.0–100.0)
Platelets: 137 10*3/uL — ABNORMAL LOW (ref 150–400)
RBC: 4.12 MIL/uL — ABNORMAL LOW (ref 4.22–5.81)
RDW: 14.1 % (ref 11.5–15.5)
WBC: 8.3 10*3/uL (ref 4.0–10.5)

## 2017-02-25 LAB — BRAIN NATRIURETIC PEPTIDE: B Natriuretic Peptide: 19 pg/mL (ref 0.0–100.0)

## 2017-03-01 ENCOUNTER — Ambulatory Visit (HOSPITAL_COMMUNITY)
Admission: RE | Admit: 2017-03-01 | Discharge: 2017-03-01 | Disposition: A | Discharge status: Home / Self Care | Payer: Medicare Other | Source: Ambulatory Visit | Attending: Internal Medicine | Admitting: Internal Medicine

## 2017-03-01 DIAGNOSIS — I429 Cardiomyopathy, unspecified: Secondary | ICD-10-CM | POA: Diagnosis not present

## 2017-03-01 DIAGNOSIS — I5022 Chronic systolic (congestive) heart failure: Secondary | ICD-10-CM | POA: Diagnosis not present

## 2017-03-01 MED ORDER — GADOBENATE DIMEGLUMINE 529 MG/ML IV SOLN
30.0000 mL | Freq: Once | INTRAVENOUS | Status: AC | PRN
  Administered 2017-03-01: 30 mL via INTRAVENOUS

## 2017-03-05 ENCOUNTER — Telehealth (HOSPITAL_COMMUNITY): Payer: Self-pay | Admitting: *Deleted

## 2017-03-05 ENCOUNTER — Other Ambulatory Visit: Payer: Self-pay | Admitting: Primary Care

## 2017-03-05 MED ORDER — METOPROLOL SUCCINATE ER 25 MG PO TB24: 12.5000 mg | ORAL_TABLET | Freq: Every day | ORAL | 3 refills | Status: AC

## 2017-03-05 MED ORDER — ROSUVASTATIN CALCIUM 10 MG PO TABS *I*
10.0000 mg | ORAL_TABLET | Freq: Every day | ORAL | 5 refills | Status: DC
Start: 2017-03-05 — End: 2017-05-16

## 2017-03-05 NOTE — Telephone Encounter (Signed)
Pt's wife called back and she is agreeable with plan.  If patient's BP is still running low Thursday morning she will call back before restarting medications.  Medication changes made.  No further questions at this time.

## 2017-03-05 NOTE — Telephone Encounter (Signed)
Pt's wife left a VM on triage line stating pt's BP has been low, it was 81/58 in left arm 98/50 right arm and then dropped to 65/43 with standing.  Discussed w/Andy Lanna Pocheillery, PA he recommends pt hold Lisinopril and Metoprolol for 1 day, if already took today then hold tomorrow.  The restart Lisinopril and restart Metoprolol at only 12.5 mg daily, drink extra fluid.    Attempted to call w/info and Left message to call back

## 2017-03-05 NOTE — Telephone Encounter (Signed)
Wife called for patient's medication to be refilled and sent to the pharmacy where they are located. It's already populated, please advise thanks

## 2017-03-05 NOTE — Telephone Encounter (Signed)
Ref Range & Units 54yr ago     Cholesterol mg/dL 146   Comments: REFERENCE RANGE: < 200 Desirable           200-239 Borderline High            > 240 High      Triglycerides mg/dL 150 (!)   Comments: REFERENCE RANGE: < 150 Normal           150-199 Borderline High           200-499 High            > 500 Very High      HDL mg/dL 46   Comments: REFERENCE RANGE: < 40 Low            > 60 High      LDL Calculated mg/dL 70   Comments: REFERENCE RANGE: < 100 Optimal           100-129 Near or above optimal           130-159 Borderline High           160-189 High            > 189 Very High      Non HDL Cholesterol mg/dL 100   Comments: Target is 30mg /dl above(or over) LDL goal   Chol/HDL Ratio  3.2

## 2017-03-08 ENCOUNTER — Ambulatory Visit (INDEPENDENT_AMBULATORY_CARE_PROVIDER_SITE_OTHER): Payer: Medicare Other | Admitting: Neurology

## 2017-03-08 DIAGNOSIS — G478 Other sleep disorders: Secondary | ICD-10-CM

## 2017-03-08 DIAGNOSIS — G3184 Mild cognitive impairment, so stated: Secondary | ICD-10-CM

## 2017-03-08 DIAGNOSIS — I480 Paroxysmal atrial fibrillation: Secondary | ICD-10-CM

## 2017-03-08 DIAGNOSIS — I5041 Acute combined systolic (congestive) and diastolic (congestive) heart failure: Secondary | ICD-10-CM

## 2017-03-08 DIAGNOSIS — I951 Orthostatic hypotension: Secondary | ICD-10-CM

## 2017-03-11 ENCOUNTER — Other Ambulatory Visit: Payer: Self-pay | Admitting: Primary Care

## 2017-03-13 ENCOUNTER — Encounter (HOSPITAL_COMMUNITY): Payer: Self-pay | Admitting: *Deleted

## 2017-03-13 ENCOUNTER — Ambulatory Visit (INDEPENDENT_AMBULATORY_CARE_PROVIDER_SITE_OTHER): Payer: Medicare Other | Admitting: Neurology

## 2017-03-13 ENCOUNTER — Encounter (INDEPENDENT_AMBULATORY_CARE_PROVIDER_SITE_OTHER): Payer: Self-pay

## 2017-03-13 ENCOUNTER — Encounter: Payer: Self-pay | Admitting: Neurology

## 2017-03-13 VITALS — BP 102/67 | HR 82 | Resp 20 | Ht 71.0 in | Wt 196.0 lb

## 2017-03-13 DIAGNOSIS — G3184 Mild cognitive impairment, so stated: Secondary | ICD-10-CM | POA: Diagnosis not present

## 2017-03-13 DIAGNOSIS — G4761 Periodic limb movement disorder: Secondary | ICD-10-CM | POA: Diagnosis not present

## 2017-03-13 DIAGNOSIS — R0683 Snoring: Secondary | ICD-10-CM

## 2017-03-13 NOTE — Progress Notes (Signed)
Provider:  Melvyn Novas, M D  Referring Provider: No ref. provider found Primary Care Physician:    Chief Complaint  Patient presents with  . Follow-up    discuss sleep study results, MRI    HPI:  Jose Hicks is a 83 y.o. male  Is seen here as a referral for transfer of care for dementia.   I have the pleasure of following Jose Hicks today on 03/13/2017, Since our initial consultation the patient has undergone a baseline polysomnography on 03/08/2017, the night of his birthday. He has a history of newly diagnosed atrial fibrillation and nonobstructive coronary artery disease and congestive heart failure with an ejection fraction of only 25%. He is now locally followed by Dr. Milas Kocher. I explained that the study shows no significant apnea of any kind. Over the whole night he had one obstructive apnea and 3 central apneas and 2 mixed apneas. Also this would be considered complex sleep apnea the overall apnea index was only 3.6 per hour and does not warrant treatment. In supine sleep his AHI was 5.3 and during REM sleep stages 6.1 per hour still below treatment necessity. His lowest oxygen level at night was 91% there was no evidence of hypoxemia. He did have frequent limb movements and jerks and twitches in his sleep but this needs only to rare arousals. He seems to sleep through it well. For this reason, I wouldn't treat that condition either.  We also reviewed the MRI in person here today and discussed each image. The patient has only minor atrophy which I would attribute to his age. There is some perisylvian atrophy also attributable to age. I repeated a Mini-Mental Status Examination today and he scored 24 out of 30 points which is stable. I did not see any evidence that he ever had strokes. His disability however was based on a diagnosis of stroke. It seems that he may have had a transient global amnesia at the time and he was let go on disability at age 56-1/2.   Initial visit  , CD  Jose Hicks is a very young appearing 83 year old gentleman referred here by [facility] Dr. Jimmie Molly. He has a history of newly diagnosed atrial fibrillation as of November 2017 mild nonobstructive  coronary artery disease, and congestive heart failure with an ejection fraction of only 25%. He had neither a heart attack nor viral infection, nor was he treated with medications that are known to provoke cardiomyopathy. He has never been on immunosuppressant medications. 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 He also carries a diagnosis of dementia but given that he had a single stroke this is not an Alzheimer or progressive process. He scored on the Mini-Mental Status Examination today 24 out of 30 points which does not happen after a 25 year history of dementia. He certainly suffered some cognitive impairment related to his stroke but that does not constitute a progressive neurodegenerative disorder. This is vascular dementia. He underwent detailed neuropsychological testing and his wife will provide me with report copies. I think he clearly has short-term memory loss - to my surprise he has no visual spatial difficulties and  he does not appear depressed, anxious or drowsy.  He is currently in [place] followed by Dr. Sampson Goon in the heart failure clinic. He has further history of osteoarthritis, atrial fibrillation, mild nonobstructive coronary artery disease by catheterization which cannot explain the degree of  cardiomyopathy. Congestive heart failure following cardiomyopathy with an ejection fraction of 25%, hyperlipidemia, diabetes mellitus, DVT, Paget's disease, protein S deficiency, spinal stenosis and a posterior stroke in 1993.   Review of Systems: Out of a complete 14 system review, the patient complains of only the following symptoms, and all other reviewed systems are  negative. Cognitive impairment: He is currently taking Namenda, after his cardiac condition was diagnosed he was taken off Aricept. He has been placed on tic is seen. He is also on Claforan, multivitamin, Tikosyn , metoprolol, metformin, Victoza , he is no longer on Lasix, no longer on donepezil, but still on Myrbetriq .    Social History   Social History  . Marital status: Married    Spouse name: N/A  . Number of children: N/A  . Years of education: N/A   Occupational History  . Retired     Social History Main Topics  . Smoking status: Never Smoker  . Smokeless tobacco: Never Used  . Alcohol use No  . Drug use: No  . Sexual activity: Not on file   Other Topics Concern  . Not on file   Social History Narrative   Drinks 1-2 caffeine drinks a day     Family History  Problem Relation Age of Onset  . Heart disease Mother   . CAD Father     Past Medical History:  Diagnosis Date  . A-fib (HCC)   . Arthritis   . CHF (congestive heart failure) (HCC)   . Dementia 1993  . Diabetes mellitus without complication [facility])     Past Surgical History:  Procedure Laterality Date  . CHOLECYSTECTOMY    . KNEE SURGERY    . SKIN GRAFT    . TONSILLECTOMY      Current Outpatient Prescriptions  Medication Sig Dispense Refill  . acetaminophen (TYLENOL) 500 MG tablet Take by mouth.    [name] apixaban (ELIQUIS) 5 MG TABS tablet Take 1 tablet (5 mg total) by mouth 2 (two) times daily. 60 tablet 3  . Blood Glucose Calibration (OT ULTRA/FASTTK CNTRL SOLN) SOLN As directed    . Blood Glucose Monitoring Suppl (FREESTYLE LITE) DEVI Use as instructed    . Cholecalciferol (VITAMIN D3) 1000 units CAPS Take 1 capsule by mouth daily.    . Cinnamon 500 MG capsule Take 500 mg by mouth daily.    . Coenzyme Q-10 200 MG CAPS Take 1 capsule by mouth daily.    [name] dofetilide (TIKOSYN) 250 MCG capsule Take 250 mcg by mouth 2 (two) times daily.    . finasteride (PROSCAR) 5 MG tablet Take 5 mg by mouth daily.     [name] glucose blood (FREESTYLE LITE) test strip TO BE USED TWO TIMES DAILY TO TEST BLOOD SUGAR    . Insulin Pen Needle (PEN NEEDLES) 32G X 4 MM MISC Use 1 times a day as instructed with Victoza pens    . liraglutide (VICTOZA) 18 MG/3ML SOPN Inject 1.8 mg into the skin daily.    [name] lisinopril (PRINIVIL,ZESTRIL) 2.5 MG tablet Take 2.5 mg by mouth daily.    . memantine (NAMENDA) 10 MG tablet Take 10 mg by mouth 2 (two) times daily.    . metFORMIN (GLUCOPHAGE) 1000 MG tablet Take 1,000 mg by mouth 2 (two) times daily with a meal.    . metoprolol succinate (TOPROL-XL) 25 MG 24 hr tablet Take 0.5 tablets (12.5 mg total) by mouth daily. 45 tablet 3  . Mirabegron (MYRBETRIQ PO) Take 1 tablet by  mouth daily.    . Multiple Vitamin (MULTIVITAMIN WITH MINERALS) TABS tablet Take 1 tablet by mouth daily.    [name]. omega-3 acid ethyl esters (LOVAZA) 1 g capsule Take 1 g by mouth daily.    . rosuvastatin (CRESTOR) 10 MG tablet Take 10 mg by mouth every evening.     No current facility-administered medications for this visit.     Allergies as of 03/13/2017 - Review Complete 03/13/2017  Allergen Reaction Noted  . Bee venom Other (See Comments) 10/12/2013    Vitals: BP 102/67   Pulse 82   Resp 20   Ht 5\' 11"  (1.803 m)   Wt 196 lb (88.9 kg)   BMI 27.34 kg/m  Last Weight:  Wt Readings from Last 1 Encounters:  03/13/17 196 lb (88.9 kg)   Last Height:   Ht Readings from Last 1 Encounters:  03/13/17 5\' 11"  (1.803 m)    Physical exam: Mr. Eulis FosterHeffner is a Caucasian left-handed gentleman.  General: The patient is awake, alert and appears not in acute distress. The patient is well groomed. Head: Normocephalic, atraumatic. Neck is supple. Mallampati 4, neck circumference: 17  Cardiovascular:  Regular rate and rhythm  without  murmurs or carotid bruit, and without distended neck veins. Respiratory: Lungs are clear to auscultation. Skin:  Without evidence of edema, or rash- bluish lips. Trunk: BMI is  elevated and  patient  has normal posture.  Neurologic exam : The patient is awake and alert, oriented to place and time.   Memory subjective  described as impaired - short term memory impairment . He described amnestic MCI.    MMSE - Mini Mental State Exam 03/13/2017 02/05/2017  Orientation to time 4 5  Orientation to Place 4 3  Registration 3 3  Attention/ Calculation 4 5  Recall 1 0  Language- name 2 objects 2 2  Language- repeat 1 1  Language- follow 3 step command 3 3  Language- read & follow direction 1 1  Write a sentence 1 1  Copy design 0 0  Total score 24 24   There is a normal attention span & concentration ability. Speech is fluent without  dysarthria, dysphonia or aphasia. Mood and affect are appropriate.  Cranial nerves:  Intact taste but loss of  smell, Pupils are equal and briskly reactive to light.Extraocular movements  in vertical and horizontal planes intact and without nystagmus.  Visual fields by finger perimetry are intact. Hearing to finger rub intact.  Facial sensation intact to fine touch. Facial motor strength on the right decreased.   tongue and uvula move midline. Tongue protrusion into either cheek is normal. Shoulder shrug is normal.   Motor exam:   Normal tone ,muscle bulk and symmetric  strength in all extremities. His left arm is clumsier- he had a neck injury 1990. Sensory:  Fine touch, pinprick and vibration were normal in both upper extremities. Both feet have less vibration sensation, but no edema.  Coordination: Rapid alternating movements in the fingers/hands were still normal.  Finger-to-nose maneuver  again  normal without evidence of ataxia, dysmetria or tremor.  Gait and station: Patient walks without assistive device and is able unassisted to climb up to the exam table. He does not have any arm swing in his left upper extremity/   Strength within normal limits. Stance is stable and normal. Deep tendon reflexes: in the  upper and lower extremities are  symmetric and intact.    Assessment:  After physical and neurologic examination, review  of laboratory studies, imaging, neurophysiology testing and pre-existing records, assessment is that of :   Jose Hicks presents with short-term memory loss, this is not a feature of global amnesia, and I doubt that is part of  stroke syndrome. His diagnosis is mild cognitive impairment with amnestic features, and has 7% conversion rate per year to Alzheimer's dementia.   He has not had any imaging studies and neurologic workups since he was granted disability in 1993, until this March 2018. His disability was granted based on a neuropsychological workup not on neurologic findings and imaging studies ( and cut his work life short by 7 years ). This 02-2017 brain MRI does not show stroke and only minor cortical atrophy, which I have attributed to age.   We have looked at the images together and I discussed with the patient that I would not remove Namenda medication but I do not feel that he is a candidate for anything else. He was very happy to hear that he doesn't have sleep apnea, and I would consider him to have mild cognitive impairment but not dementia at this point.   Follow up with CHF doctor.   Aricept was removed because of potential EKG changes .  Prn revisit.    Porfirio Mylar Antony Sian MD 03/13/2017

## 2017-03-13 NOTE — Procedures (Signed)
PATIENT'S NAME:  Bernie CoveyHaefner, Maxamillian DOB:      1934-09-25      MR#:    [id number]     DATE OF RECORDING: 03/08/2017 REFERRING M.D.:  Susanne BordersLion Greenberg, MD and Dr. Ellender Hoseaniel Bensimon Study Performed:   Baseline Polysomnogram HISTORY:  Mr. Luvenia HellerHaefner is a very young appearing 83 year old gentleman referred here by [facility]'s Dr. Jimmie MollyGreenberg. He has a history of newly diagnosed atrial fibrillation as of November 2017 and  non-obstructive  coronary artery disease, and congestive heart failure with an ejection fraction of only 25%. Here to rule out apnea.   The patient endorsed the Epworth Sleepiness Scale at 12 points.   The patient's weight 199 pounds with a height of 71 (inches), resulting in a BMI of 27.8 kg/m2. The patient's neck circumference measured 17 inches.  CURRENT MEDICATIONS: Tylenol, Eliquis, Vitamin D3, CoQ-10, Tikosyn, Proscar, Lasix, Victoza, Zestril, Namenda, Metformin, Toprol, Myrbetriq, Multivitamin, Crestor, Aldactone   PROCEDURE:  This is a multichannel digital polysomnogram utilizing the Somnostar 11.2 system.  Electrodes and sensors were applied and monitored per AASM Specifications.   EEG, EOG, Chin and Limb EMG, were sampled at 200 Hz.  ECG, Snore and Nasal Pressure, Thermal Airflow, Respiratory Effort, CPAP Flow and Pressure, Oximetry was sampled at 50 Hz. Digital video and audio were recorded.      BASELINE STUDY  Lights Out was at 20:45 and Lights On at 05:05.  Total recording time (TRT) was 500.5 minutes, with a total sleep time (TST) of 415.5 minutes.   The patient's sleep latency was 43 minutes.  REM latency was 151.5 minutes.  The sleep efficiency was 83. %.     SLEEP ARCHITECTURE: WASO (Wake after sleep onset) was 73.5 minutes.  There were 18.5 minutes in Stage N1, 261.5 minutes Stage N2, 86 minutes Stage N3 and 49.5 minutes in Stage REM.  The percentage of Stage N1 was 4.5%, Stage N2 was 62.9%, Stage N3 was 20.7% and Stage R (REM sleep) was 11.9%.   RESPIRATORY  ANALYSIS:  There were a total of 25 respiratory events:  1 obstructive apnea, 3 central apneas and 2 mixed apneas with 19 hypopneas. The patient also had 0 respiratory event related arousals (RERAs). The total APNEA/HYPOPNEA INDEX (AHI) was 3.6/hour and the total RESPIRATORY DISTURBANCE INDEX was 3.6 /hour.  5 events occurred in REM sleep and 38 events in NREM. The REM AHI was 6.1 /hour, versus a non-REM AHI of 3.3. The patient spent 180 minutes of total sleep time in the supine position and 236 minutes in non-supine.The supine AHI was 5.3 versus a non-supine AHI of 2.3.  OXYGEN SATURATION & C02:  The Wake baseline 02 saturation was 94%, with the lowest being 91%. Time spent below 89% saturation equaled 0 minutes.  PERIODIC LIMB MOVEMENTS:   The patient had a total of 103 Periodic Limb Movements.  The Periodic Limb Movement (PLM) index was 14.9 and the PLM Arousal index was 1.7/hour.  The arousals were noted as: 15 were spontaneous, 12 were associated with PLMs, and 11 were associated with respiratory events. Audio and video analysis did not show any abnormal or unusual movements, behaviors, phonations or vocalizations.   The patient took one bathroom break. Snoring was noted. EKG with PVCs.   IMPRESSION:  1. Clinically insignificant degree of Sleep Apnea (SA), with only mild accentuation during REM and/ or supine sleep.  2. Periodic Limb Movement Disorder (PLMD), low arousal index. 3. Primary Snoring  RECOMMENDATIONS:  1. The degree of apnea is  not concerning as a contributor to atrial fibrillation. There was no hypoxemia noted, no cardiac stress signs detected. A follow up appointment will be scheduled in the Sleep Clinic at [facility]. The referring provider will be notified of the results.      I certify that I have reviewed the entire raw data recording prior to the issuance of this report in accordance with the Standards of Accreditation of the American Academy of  Sleep Medicine (AASM)      Melvyn Novas, MD  03-13-2017 Diplomat, American Board of Psychiatry and Neurology  Diplomat, American Board of Sleep Medicine Medical Director, [place] [facility] [employer]

## 2017-03-13 NOTE — Progress Notes (Signed)
Medical records requested by patient's wife Morey Hummingbird(Joan Zaccaro) to be faxed to PCP Dr. Jimmie MollyGreenberg.  Records faxed today as requested to fax number [phone]. Patient's wife aware.

## 2017-03-18 ENCOUNTER — Ambulatory Visit (HOSPITAL_COMMUNITY)
Admission: RE | Admit: 2017-03-18 | Discharge: 2017-03-18 | Disposition: A | Discharge status: Home / Self Care | Payer: Medicare Other | Source: Ambulatory Visit | Attending: Internal Medicine | Admitting: Internal Medicine

## 2017-03-18 ENCOUNTER — Encounter (HOSPITAL_COMMUNITY): Payer: Self-pay | Admitting: Internal Medicine

## 2017-03-18 VITALS — BP 110/72 | HR 80 | Wt 194.8 lb

## 2017-03-18 DIAGNOSIS — M1990 Unspecified osteoarthritis, unspecified site: Secondary | ICD-10-CM | POA: Diagnosis not present

## 2017-03-18 DIAGNOSIS — Z79899 Other long term (current) drug therapy: Secondary | ICD-10-CM | POA: Insufficient documentation

## 2017-03-18 DIAGNOSIS — I429 Cardiomyopathy, unspecified: Secondary | ICD-10-CM | POA: Diagnosis not present

## 2017-03-18 DIAGNOSIS — Z7901 Long term (current) use of anticoagulants: Secondary | ICD-10-CM | POA: Diagnosis not present

## 2017-03-18 DIAGNOSIS — E119 Type 2 diabetes mellitus without complications: Secondary | ICD-10-CM | POA: Insufficient documentation

## 2017-03-18 DIAGNOSIS — I480 Paroxysmal atrial fibrillation: Secondary | ICD-10-CM

## 2017-03-18 DIAGNOSIS — Z794 Long term (current) use of insulin: Secondary | ICD-10-CM | POA: Insufficient documentation

## 2017-03-18 DIAGNOSIS — I5022 Chronic systolic (congestive) heart failure: Secondary | ICD-10-CM | POA: Insufficient documentation

## 2017-03-18 DIAGNOSIS — F0390 Unspecified dementia without behavioral disturbance: Secondary | ICD-10-CM | POA: Insufficient documentation

## 2017-03-18 DIAGNOSIS — Z91030 Bee allergy status: Secondary | ICD-10-CM | POA: Diagnosis not present

## 2017-03-18 DIAGNOSIS — Z8673 Personal history of transient ischemic attack (TIA), and cerebral infarction without residual deficits: Secondary | ICD-10-CM | POA: Insufficient documentation

## 2017-03-18 DIAGNOSIS — I2510 Atherosclerotic heart disease of native coronary artery without angina pectoris: Secondary | ICD-10-CM | POA: Insufficient documentation

## 2017-03-18 NOTE — Patient Instructions (Signed)
Follow up with Dr. Gala Romney as needed for any further cardiac care needs.  Do the following things EVERYDAY: 1) Weigh yourself in the morning before breakfast. Write it down and keep it in a log. 2) Take your medicines as prescribed 3) Eat low salt foods-Limit salt (sodium) to 2000 mg per day.  4) Stay as active as you can everyday 5) Limit all fluids for the day to less than 2 liters

## 2017-03-18 NOTE — Addendum Note (Signed)
Encounter addended by: Chyrl Civatte, RN on: 03/18/2017  1:07 PM<BR>    Actions taken: Sign clinical note

## 2017-03-18 NOTE — Progress Notes (Signed)
ADVANCED HF CLINIC CONSULT NOTE  Referring Physician: Dr. Andrena Mews Primary Care: Dr. Clarise Cruz [facility]) Primary Cardiologist: New (Iyanni Hepp)  HPI:  Mr. Darrelyn Hillock" Noyola is an 83 y/o male with h/o DM, bilateral DVT in protein S deficiency, PAF, CVA, dementia and chronic systolic HF due to NICM EF 25%.   Diagnosed for HF for the first time in November 2017 in [place]. Echo at time EF 25%. Also found to be in new onset AF at that time too. Cath 11/15/2016 with mild non-obstructive CAD with 40% lesion in mLCX only. Thought to possibly have AF-related cardiomyopathy though rates were not elevated at time of presentation. Had initial DC-CV but reverted to AF after a short period. Readmitted to the hospital in 12/17 for Tikosyn load. (Had to stop Aricept at the time due to incompatibility with Tikosyn).   He and his wife moved from [place] to [place] to be with her daughter.  We saw him for the first time about a month ago. BP was low. We stopped lasix. Sent for cMRI. EF 39% with no scar or infiltrate. Still quite fatigued. Naps a lot. Plays 9 holes of golf but has to ride in the cart. SBP ~100. No DOE. No edema. No palpitations. Weight stable 193-195. Switched to [employer]. No bleleding.   Has seen Dr. Vickey Huger and diagnosed with short-term memory loss. No dementia. Sleep study normal.   He has 15 siblings. One brother had a heart/kidney transplant at [facility] in [place]. No other family members with heart failure.      Past Medical History:  Diagnosis Date  . A-fib (HCC)   . Arthritis   . CHF (congestive heart failure) (HCC)   . Dementia 1993  . Diabetes mellitus without complication [facility])     Current Outpatient Prescriptions  Medication Sig Dispense Refill  . acetaminophen (TYLENOL) 500 MG tablet Take by mouth.    [name] apixaban (ELIQUIS) 5 MG TABS tablet Take 1 tablet (5 mg total) by mouth 2 (two) times daily. 60 tablet 3  . Blood Glucose Calibration (OT ULTRA/FASTTK  CNTRL SOLN) SOLN As directed    . Blood Glucose Monitoring Suppl (FREESTYLE LITE) DEVI Use as instructed    . Cholecalciferol (VITAMIN D3) 1000 units CAPS Take 1 capsule by mouth daily.    . Cinnamon 500 MG capsule Take 500 mg by mouth daily.    . Coenzyme Q-10 200 MG CAPS Take 1 capsule by mouth daily.    [name] dofetilide (TIKOSYN) 250 MCG capsule Take 250 mcg by mouth 2 (two) times daily.    . finasteride (PROSCAR) 5 MG tablet Take 5 mg by mouth daily.    [name] glucose blood (FREESTYLE LITE) test strip TO BE USED TWO TIMES DAILY TO TEST BLOOD SUGAR    . Insulin Pen Needle (PEN NEEDLES) 32G X 4 MM MISC Use 1 times a day as instructed with Victoza pens    . liraglutide (VICTOZA) 18 MG/3ML SOPN Inject 1.8 mg into the skin daily.    [name] lisinopril (PRINIVIL,ZESTRIL) 2.5 MG tablet Take 2.5 mg by mouth daily.    . memantine (NAMENDA) 10 MG tablet Take 10 mg by mouth 2 (two) times daily.    . metFORMIN (GLUCOPHAGE) 1000 MG tablet Take 1,000 mg by mouth 2 (two) times daily with a meal.    . metoprolol succinate (TOPROL-XL) 25 MG 24 hr tablet Take 0.5 tablets (12.5 mg total) by mouth daily. 45 tablet 3  . Mirabegron (MYRBETRIQ PO) Take 1  tablet by mouth daily.    . Multiple Vitamin (MULTIVITAMIN WITH MINERALS) TABS tablet Take 1 tablet by mouth daily.    [name] omega-3 acid ethyl esters (LOVAZA) 1 g capsule Take 1 g by mouth daily.    . rosuvastatin (CRESTOR) 10 MG tablet Take 10 mg by mouth every evening.     No current facility-administered medications for this encounter.     Allergies  Allergen Reactions  . Bee Venom Other (See Comments)    Red and swelling      Social History   Social History  . Marital status: Married    Spouse name: N/A  . Number of children: N/A  . Years of education: N/A   Occupational History  . Retired     Social History Main Topics  . Smoking status: Never Smoker  . Smokeless tobacco: Never Used  . Alcohol use No  . Drug use: No  . Sexual activity: Not on file    Other Topics Concern  . Not on file   Social History Narrative   Drinks 1-2 caffeine drinks a day    Family history:  15 siblings. --1 with heart/kidney transplant --1 with pacemaker --No family h/o SCD or premature CAD  Vitals:   03/18/17 1222  BP: 110/72  Pulse: 80  SpO2: 97%  Weight: 194 lb 12.8 oz (88.4 kg)    PHYSICAL EXAM: General:  Elderly Well appearing. No respiratory difficulty here with family  HEENT: normal anicteric Neck: supple. JVP flat. Carotids 2+ bilat; no bruits. No lymphadenopathy or thryomegaly appreciated. Cor: PMI laterally displaced. Regular rate & rhythm. No rubs, gallops or murmurs. Lungs: clear Abdomen: soft, nontender, nondistended. No hepatosplenomegaly. No bruits or masses. Good bowel sounds. Extremities: no cyanosis, clubbing, rash, edema Neuro: alert & oriented x 3, cranial nerves grossly intact. moves all 4 extremities w/o difficulty. Affect pleasant.   ASSESSMENT & PLAN: 1. Chronic systolic HF - new diagnosis 11/17. EF 25%. Cath mild non-obstructive CAD.  - cMRI 3/17 EF 39% no scar or infiltrative process. Reviewed with him and his family --Etiology remains unclear. Related to AF but rate at presentation was not fast. No antecedent viral syndrome. No ETOH, BP ok --NYHA II-III. EF improving by cMRI --Volume status looks good off lasix  --Continue low-dose lisinopril and Toprol. BP too low to titrate  Can take at night to minimize fatigue.  --Will have f/u with Dr. Fransisco Beau in [place] with repeat echo. I called and left him a message.   2. PAF --Recent onset.  --Maintaining NSR on Tikosyn,  --Continue Eliquis  3. Dementia --Has seen Dr. Vickey Huger. Diagnosed with short-term memory loss. No evidence of dementia or OSA.   Glyn Zendejas,MD 12:45 PM

## 2017-04-04 ENCOUNTER — Ambulatory Visit: Payer: Medicare Other | Admitting: Neurology

## 2017-04-22 ENCOUNTER — Encounter: Payer: Self-pay | Admitting: Gastroenterology

## 2017-05-14 ENCOUNTER — Other Ambulatory Visit: Payer: Self-pay | Admitting: Primary Care

## 2017-05-14 DIAGNOSIS — E119 Type 2 diabetes mellitus without complications: Secondary | ICD-10-CM

## 2017-05-16 ENCOUNTER — Other Ambulatory Visit: Payer: Self-pay | Admitting: Primary Care

## 2017-05-16 MED ORDER — FINASTERIDE 5 MG PO TABS *I*
ORAL_TABLET | ORAL | 3 refills | Status: DC
Start: 2017-05-16 — End: 2018-02-08

## 2017-05-16 MED ORDER — ROSUVASTATIN CALCIUM 10 MG PO TABS *I*
10.0000 mg | ORAL_TABLET | Freq: Every day | ORAL | 5 refills | Status: DC
Start: 2017-05-16 — End: 2017-12-19

## 2017-05-16 NOTE — Telephone Encounter (Signed)
Fax from [facility] requesting a refill for finasteride 5 mg and rosuvastatin 10 mg. Thank you.

## 2017-05-27 ENCOUNTER — Other Ambulatory Visit: Payer: Self-pay | Admitting: Endocrinology

## 2017-05-28 ENCOUNTER — Other Ambulatory Visit: Payer: Self-pay | Admitting: Primary Care

## 2017-05-28 MED ORDER — APIXABAN 5 MG PO TABS *I*
ORAL_TABLET | ORAL | 0 refills | Status: DC
Start: 2017-05-28 — End: 2017-06-28

## 2017-05-28 NOTE — Telephone Encounter (Signed)
eliquis did come through the system as being prescribed on 04/28/17 #60 with a refill.  He has been at [facility] in the past in [place] per scanned notes, page 17 notes the change to eliquis  The note from 3/6 is from [facility] when she spoke with patient's wife.  They are in need of refill.  Should schedule follow up with LG when returns, Freight forwarder on this

## 2017-05-28 NOTE — Telephone Encounter (Signed)
Per 02/19/17 telephone call. "I spoke with Jon [name] and Jon [name] was seen by cardiologist down in [place]. And was taken off of coumadin and put on Eliquis 5 mg twice a day, mobic stopped along with furosemide and Jon [name] stated he was put on other meds and will have the cardiologist in [place] send reports to Dr. Tivoli Skene."

## 2017-06-28 ENCOUNTER — Other Ambulatory Visit: Payer: Self-pay | Admitting: Internal Medicine

## 2017-06-28 ENCOUNTER — Other Ambulatory Visit: Payer: Self-pay | Admitting: Primary Care

## 2017-06-28 LAB — BASIC METABOLIC PANEL
CO2: 25 mmol/L (ref 20–31)
Calcium: 8.2 mg/dL — ABNORMAL LOW (ref 8.6–10.2)
Chloride: 106 mmol/L (ref 99–109)
Creatinine: 1.08 mg/dL (ref 0.70–1.30)
Glucose: 230 MG/DL — ABNORMAL HIGH (ref 60–140)
Lab: 23 mg/dL (ref 9–23)
Potassium: 4.3 mmol/L (ref 3.5–5.1)
Sodium: 139 mmol/L (ref 136–145)
UN/Creat Ratio: 21 — ABNORMAL HIGH (ref 10–20)

## 2017-06-28 LAB — ESTIMATED GFR
GFR,Black: >60 mL/min/{1.73_m2} (ref >=60)
GFR,Caucasian: >60 mL/min/{1.73_m2} (ref >=60)

## 2017-08-09 ENCOUNTER — Other Ambulatory Visit: Payer: Self-pay | Admitting: Internal Medicine

## 2017-08-09 LAB — ESTIMATED GFR
GFR,Black: >60 mL/min/{1.73_m2} (ref >=60)
GFR,Caucasian: >60 mL/min/{1.73_m2} (ref >=60)

## 2017-08-09 LAB — BASIC METABOLIC PANEL
CO2: 28 mmol/L (ref 20–31)
Calcium: 8.9 mg/dL (ref 8.6–10.2)
Chloride: 108 mmol/L (ref 99–109)
Creatinine: 1.07 mg/dL (ref 0.70–1.30)
Glucose: 127 MG/DL (ref 60–140)
Lab: 20 mg/dL (ref 9–23)
Potassium: 4.6 mmol/L (ref 3.5–5.1)
Sodium: 143 mmol/L (ref 136–145)
UN/Creat Ratio: 19 (ref 10–20)

## 2017-09-13 ENCOUNTER — Other Ambulatory Visit: Payer: Self-pay | Admitting: Endocrinology

## 2017-09-15 ENCOUNTER — Other Ambulatory Visit: Payer: Self-pay | Admitting: Primary Care

## 2017-09-16 ENCOUNTER — Other Ambulatory Visit: Payer: Self-pay | Admitting: Urology

## 2017-09-16 ENCOUNTER — Other Ambulatory Visit: Payer: Self-pay | Admitting: Endocrinology

## 2017-09-16 MED ORDER — LIRAGLUTIDE 18 MG/3ML SC SOPN *I*
1.8000 mg | PEN_INJECTOR | Freq: Every day | SUBCUTANEOUS | 0 refills | Status: DC
Start: 2017-09-16 — End: 2017-09-19

## 2017-09-16 MED ORDER — MIRABEGRON ER 50 MG PO TB24 *I*
50.0000 mg | ORAL_TABLET | Freq: Every day | ORAL | 2 refills | Status: DC
Start: 2017-09-16 — End: 2018-07-12

## 2017-09-16 NOTE — Telephone Encounter (Signed)
No longer your patient, please refuse

## 2017-09-16 NOTE — Telephone Encounter (Signed)
Jon [name]'s wife, [name] calling to request prescription(s)   Requested Prescriptions     Pending Prescriptions Disp Refills    liraglutide (VICTOZA) 18 MG/3ML injection 18 mL 4     Sig: Inject 1.8 mg into the skin daily      to be sent to the [facility].    Is patient out of the medication? yes  Does the patient have questions regarding the medication for the nurse? no    Patient can be reached if necessary at [phone].    Patient is scheduled with Burt Knack, NP 10/4 at 10:40 am

## 2017-09-16 NOTE — Telephone Encounter (Signed)
Please remove Dr. Blair Hailey name as [name] has a new PCP, Thank you

## 2017-09-16 NOTE — Telephone Encounter (Signed)
Althea Grimmer calling to request prescription(s) myrbetriq to be sent to the following Pharmacy [facility]    Is patient out of the medication? yes  Does the patient have questions regarding the medication for the nurse? no    Patient can be reached if necessary at [phone] (H)

## 2017-09-19 ENCOUNTER — Ambulatory Visit: Payer: Medicare (Managed Care) | Attending: Endocrinology | Admitting: Endocrinology

## 2017-09-19 VITALS — BP 112/68 | HR 75 | Ht 71.0 in | Wt 194.0 lb

## 2017-09-19 DIAGNOSIS — IMO0002 Reserved for concepts with insufficient information to code with codable children: Secondary | ICD-10-CM

## 2017-09-19 DIAGNOSIS — E119 Type 2 diabetes mellitus without complications: Secondary | ICD-10-CM | POA: Insufficient documentation

## 2017-09-19 DIAGNOSIS — E1165 Type 2 diabetes mellitus with hyperglycemia: Secondary | ICD-10-CM

## 2017-09-19 LAB — POCT HEMOGLOBIN A1C: Hemoglobin A1C,POC: 7.9 % — ABNORMAL HIGH (ref 4.0–6.0)

## 2017-09-19 MED ORDER — LIRAGLUTIDE 18 MG/3ML SC SOPN *I*
1.8000 mg | PEN_INJECTOR | Freq: Every day | SUBCUTANEOUS | 3 refills | Status: DC
Start: 2017-09-19 — End: 2018-11-16

## 2017-09-20 ENCOUNTER — Other Ambulatory Visit: Payer: Self-pay | Admitting: Primary Care

## 2017-09-20 NOTE — Telephone Encounter (Signed)
Please refuse, no longer your patient.

## 2017-09-23 ENCOUNTER — Ambulatory Visit: Payer: Medicare (Managed Care) | Attending: Primary Care | Admitting: Primary Care

## 2017-09-23 ENCOUNTER — Encounter: Payer: Self-pay | Admitting: Primary Care

## 2017-09-23 VITALS — BP 92/50 | HR 72 | Ht 70.0 in | Wt 194.0 lb

## 2017-09-23 DIAGNOSIS — M545 Low back pain, unspecified: Secondary | ICD-10-CM

## 2017-09-23 DIAGNOSIS — I509 Heart failure, unspecified: Secondary | ICD-10-CM

## 2017-09-23 DIAGNOSIS — G8929 Other chronic pain: Secondary | ICD-10-CM

## 2017-09-23 DIAGNOSIS — E1169 Type 2 diabetes mellitus with other specified complication: Secondary | ICD-10-CM

## 2017-09-23 DIAGNOSIS — Z23 Encounter for immunization: Secondary | ICD-10-CM

## 2017-09-23 MED ORDER — MEMANTINE HCL 10 MG PO TABS *I*
10.0000 mg | ORAL_TABLET | Freq: Two times a day (BID) | ORAL | 3 refills | Status: DC
Start: 2017-09-23 — End: 2018-09-28

## 2017-09-23 NOTE — Progress Notes (Signed)
New Patient Visit    Jon [name] is 83 y.o. male presenting for a new patient visit at [facility].     Medical conditions include:    CHF  - just started new med, entresto  When BP low, can tell, is a little bit woozy  - denies chest pain, does have a little shortness of breath at rest  Weighs himself occasionally, per wife  No swelling, no cough    Low back pain  Chronic, last xray 56yr ago, arthritis  Has tried acupuncture, was helpful  Little physical activity  Has hydrocodone, does not use it, wife districutues    DM2  -takes metformin, tolerates well  - has a little tingling in feet  - sees endo    H/o DVT, has chronic calf pain  On eliquis    Patient Active Problem List   Diagnosis Code    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    Type 2 diabetes mellitus with other specified complication EM08.67   Obesity E66.9    Urgency of urination R39.15    BPH (benign prostatic hyperplasia) N40.0    Penile lesion N48.9    Opiate analgesic contract exists Z02.89    Lower urinary tract symptoms (LUTS) R39.9    CHF (congestive heart failure) I50.9    Paroxysmal atrial fibrillation I48.0    Cardiomyopathy I42.9    Chronic anticoagulation Z79.01    Protein S deficiency D68.59    Vascular dementia F01.50    Chronic low back pain M54.5, G89.29       Current Outpatient Prescriptions   Medication Sig    memantine (NAMENDA) 10 MG tablet Take 1 tablet (10 mg total) by mouth 2 times daily    Sacubitril-Valsartan (ENTRESTO) 24-26 MG per tablet Take 1 tablet by mouth daily    liraglutide (VICTOZA) 18 MG/3ML injection Inject 1.8 mg into the skin daily    mirabegron (MYRBETRIQ) 50 MG 24 hr tablet Take 1 tablet (50 mg total) by mouth daily    ELIQUIS 5 MG tablet TAKE 1 TABLET BY MOUTH TWO TIMES DAILY    BD PEN NEEDLE NANO U/F 32G X 4 MM USE ONE TIME EVERY DAY AS DIRECTED WITH VICTOZA PENS    finasteride (PROSCAR) 5 MG tablet TAKE ONE TABLET BY MOUTH ONCE DAILY FOR USE BY MEN ONLY    rosuvastatin (CRESTOR)  10 MG tablet Take 1 tablet (10 mg total) by mouth daily (with dinner)    metFORMIN (GLUCOPHAGE) 1000 MG tablet TAKE 1 TABLET BY MOUTH TWO TIMES DAILY WITH FOOD    MYRBETRIQ 50 MG 24 hr tablet TAKE ONE TABLET BY MOUTH ONCE DAILY    metoprolol (TOPROL-XL) 25 MG 24 hr tablet Take 25 mg by mouth daily   Do not crush or chew. May be divided.    ipratropium (ATROVENT) 0.03 % nasal spray 2 sprays by Each Nare route 3 times daily    FREESTYLE LITE test strip TO BE USED TWO TIMES DAILY TO TEST BLOOD SUGAR    blood glucose monitor (FREESTYLE LITE) kit Use as instructed    lancets (ONETOUCH ULTRASOFT) Use   2  times per day as instructed for blood glucose testing.    Blood Glucose Calibration (OT ULTRA/FASTTK CNTRL SOLN) SOLN As directed    CINNAMON PO Take by mouth    cyanocobalamin (VITAMIN B-12) 1000 MCG tablet Take 1,000 mcg by mouth daily    Co-Enzyme Q-10 100 MG CAPS Take by mouth    Multiple Vitamins-Minerals (  MULTI VITAMIN/MINERALS PO) Take by mouth    HYDROcodone-acetaminophen (VICODIN) 5-500 MG per tablet 1-2 po q 6 hours prn pain MDD=6    cholecalciferol (VITAMIN D) 1000 UNIT tablet Take 2,000 Units by mouth daily        FISH OIL by Does not apply route daily.    acetaminophen (TYLENOL) 500 MG tablet Take 500 mg by mouth 2 times daily.       Allergies   Allergen Reactions    Bee Venom Other (See Comments)     Red and swelling    No Known Latex Allergy      Created by Conversion - 0;     Insects [Other] Other (See Comments)     Red and swelling       Past Medical History:   Diagnosis Date    Arthritis     BPH (benign prostatic hypertrophy)     CHF (congestive heart failure)     Clotting disorder     Dementia     mild    Diabetes mellitus     Paget's disease of bone     Palpitations     Protein S deficiency     On chronic anticoagulation    Sinus bradycardia     Varicella        Past Surgical History:   Procedure Laterality Date    APPENDECTOMY      CHOLECYSTECTOMY      EYE SURGERY       GALLBLADDER SURGERY      JOINT REPLACEMENT      KNEE REPLACEMENT      SKIN GRAFT      TONSILLECTOMY         Social History     Social History    Marital status: Married     Spouse name: N/A    Number of children: N/A    Years of education: N/A     Occupational History    Former Personal assistant, used to work night shift      Social History Main Topics    Smoking status: Never Smoker    Smokeless tobacco: Never Used    Alcohol use No    Drug use: No    Sexual activity: No     Social History Narrative    2 daughters with neurological issues         Family history entered and reviewed in chart.     Review of Systems   (Positive items noted in bold, otherwise listed items are negative)  General: fatigue, unexplained weight change, loss of appetite, fever, night sweats, weakness  ENT: hearing difficulty,ear pain, ear ringing, sinus problems, nasal congestion, nosebleeds, oral lesions, dental problems, throat pain  CV: irregular heartbeat, racing heart, chest pain, leg swelling, leg pain with walking, DOE  Resp: shortness of breath, prolonged cough, productive cough, wheezing, pleuritic pain  GI: dysphagia, heartburn, nausea, vomiting, constipation, diarrhea, abdominal pain, blood in stool, melena, changes in bowel habits  GU: dysuria, urinary frequency or urgency, urinary incontinence, nocturia  MSK: joint pain, joint swelling, myalgias, muscle weakness  Derm: rash, itching, new or changing skin lesions, hair loss or change  Neuro: frequent headache, vision changes, numbness, paraesthesias, balance or gait difficulty, dizziness, tremor, uncontrolled movements  Psych: insomnia, irritability, depression, anxiety, mood swings, frequent anger, hallucinations  Endocrine: heat or cold intolerance, libido changes, frequent thirst  Heme: easy bruising or bleeding, unexplained swelling  Allergy and Immunology: itchy or watery eyes or nose, frequent infections  BP 92/50 (BP Location: Right arm, Patient Position:  Sitting, Cuff Size: adult)   Pulse 72   Ht 1.778 m ('5\' 10"' )   Wt 88 kg (194 lb)   SpO2 96%   BMI 27.84 kg/m2    GEN: Alert, pleasant well adult in NAD.   HEENT: Normocephalic and atraumatic. PERRL. Moist mucous membranes. TM's clear BL, no pharyngeal erythema  NECK: Supple, no lymphadenopathy or thyromegaly   PULM: Easy respirations, well aerated, CTA bilaterally.   CVS: RRR, no murmur, normal S1& S2.  ABD: Soft, non-distended, non-tender, no hepatosplenomegaly, no masses.   SKIN: No rashes or concerning lesions on exposed skin   NEURO: Normal gait  Ext: No edema     PHQ9  PHQ-2/9 11/07/2016   Over the last two weeks, have you often been bothered by feeling down, depressed or hopeless? N   Over the last two weeks, have you often had little interest or pleasure in doing things? N       Assessment/Plan   1. New Patient Visit Establish Care    1. Immunization due  - Flu vaccine, high dose, greater than or equal to 62 yo, preservative free    2. Chronic low back pain. Last xray >37yr ago. No acute symptoms reported. Has done acupuncture in past with improvement symptoms. Counseled on exercise, stretching, complementary therapies. Does have hydrocodone at home, but has not used this in some time. His wife dispenses his medications.   - SPINE LUMBAR MIN 4 VIEWS COMPLETE; Future    3. Type 2 diabetes mellitus with other specified complication. A1c at goal <8.   - continue metformin, victoza  - continue statin    4. Congestive heart failure; systolic. Nonischemic cardiomyopathy. EF 30-35%.  - followed by cardiology; recently started on entresto; dose limited by blood pressure, but seems to be tolerating ok, occasional symptoms of mild orthostasis  - continue metoprolol       Care Planning   Health Care Proxy: Spouse  Advanced Directive Packet Given [  ]     Old records were reviewed.       Return in about 6 months (around 03/24/2018) for Follow-up. HTN, CHF, DM2, chronic low back pain, needs foot exam at next visit.        SGeorgiann Mccoy MD, MPH  [facility]

## 2017-09-24 ENCOUNTER — Telehealth: Payer: Self-pay | Admitting: Primary Care

## 2017-09-24 NOTE — Telephone Encounter (Signed)
Pt's daughter called in stating that the pt is experiencing some anxiety due to some issues currently going on with the family, and forgot to mention to Dr.Woz yesterday. Pt's daughter saying that he was on anxiety meds years ago and wanted to see if Dr.Woz could just re prescribe those again. I let her know I do not have authorization to speak with her as I do not see her name on any information and as well let her know that he would have to come in to see Dr.Woz for anything. Please advise.    Thanks, Konrad Penta

## 2017-09-24 NOTE — Telephone Encounter (Signed)
Pt needs to be seen in the office to discuss his anxiety symptoms and previous medications tried. Please contact him to see if he would like to make an appt.

## 2017-09-25 NOTE — Telephone Encounter (Signed)
I call patient and left a message for him to call to schedule a follow up with Dr. Jolaine Click if needed. JMA

## 2017-10-02 ENCOUNTER — Other Ambulatory Visit: Payer: Self-pay | Admitting: Gastroenterology

## 2017-10-04 ENCOUNTER — Encounter: Payer: Self-pay | Admitting: Endocrinology

## 2017-10-04 NOTE — Progress Notes (Signed)
HPI: Jon [name] is a 83 y.o. male who presents for follow up of Uncontrolled type 2 diabetes.         INTERVAL HISTORY: the patient has been diagnosed with A Fib and CHF. He had a recent MRI of the brain and cardiac angiogram. He has an upcoming sleep study.   --Treatment history:  --Current management:  Current diabetic medications include: Victoza 1.8 mg daily/Metformin 1 gm twice a day   -- Self-glucose monitoring: daily; no data  -- Hyperglycemia: unknown  -- Hypoglycemia: denies confusion, dizziness, headache, seizures, sweating, nightmares and LOC.  Hypoglycemia unawareness: denies  -- Endocrine: + polyuria  -- CV: denies chest pain, +shortness of breath on exertion  --Extremities:     -- Hands: denies tingling,numbness     -- Feet: denies tingling, +numbness/pain  -- Eyes: denies history of laser treatment, double/blurry vision, retinopathy, cataracts, glaucoma, Last eye exam: within 1 year.  -- Kidney: denies history of nephropathy.  -- Diet: no specific  -- Exercise: no routine    Current Outpatient Prescriptions   Medication Sig    memantine (NAMENDA) 10 MG tablet Take 1 tablet (10 mg total) by mouth 2 times daily    Sacubitril-Valsartan (ENTRESTO) 24-26 MG per tablet Take 1 tablet by mouth daily    liraglutide (VICTOZA) 18 MG/3ML injection Inject 1.8 mg into the skin daily    mirabegron (MYRBETRIQ) 50 MG 24 hr tablet Take 1 tablet (50 mg total) by mouth daily    ELIQUIS 5 MG tablet TAKE 1 TABLET BY MOUTH TWO TIMES DAILY    BD PEN NEEDLE NANO U/F 32G X 4 MM USE ONE TIME EVERY DAY AS DIRECTED WITH VICTOZA PENS    finasteride (PROSCAR) 5 MG tablet TAKE ONE TABLET BY MOUTH ONCE DAILY FOR USE BY MEN ONLY    rosuvastatin (CRESTOR) 10 MG tablet Take 1 tablet (10 mg total) by mouth daily (with dinner)    metFORMIN (GLUCOPHAGE) 1000 MG tablet TAKE 1 TABLET BY MOUTH TWO TIMES DAILY WITH FOOD    MYRBETRIQ 50 MG 24 hr tablet TAKE ONE TABLET BY MOUTH ONCE DAILY    metoprolol (TOPROL-XL) 25 MG 24 hr  tablet Take 25 mg by mouth daily   Do not crush or chew. May be divided.    ipratropium (ATROVENT) 0.03 % nasal spray 2 sprays by Each Nare route 3 times daily    FREESTYLE LITE test strip TO BE USED TWO TIMES DAILY TO TEST BLOOD SUGAR    blood glucose monitor (FREESTYLE LITE) kit Use as instructed    lancets (ONETOUCH ULTRASOFT) Use   2  times per day as instructed for blood glucose testing.    Blood Glucose Calibration (OT ULTRA/FASTTK CNTRL SOLN) SOLN As directed    CINNAMON PO Take by mouth    cyanocobalamin (VITAMIN B-12) 1000 MCG tablet Take 1,000 mcg by mouth daily    Co-Enzyme Q-10 100 MG CAPS Take by mouth    Multiple Vitamins-Minerals (MULTI VITAMIN/MINERALS PO) Take by mouth    HYDROcodone-acetaminophen (VICODIN) 5-500 MG per tablet 1-2 po q 6 hours prn pain MDD=6    cholecalciferol (VITAMIN D) 1000 UNIT tablet Take 2,000 Units by mouth daily        FISH OIL by Does not apply route daily.    acetaminophen (TYLENOL) 500 MG tablet Take 500 mg by mouth 2 times daily.          Patient's medications, allergies, and problems were reviewed and updated as appropriate.  ROS:   CONSTITUTIONAL: Appetite good, no fevers, night sweats or weight loss   HEENT: No double or blurry vision; denies severe headache   CV: No chest pain, shortness of breath or peripheral edema   RESPIRATORY: No cough, wheezing or dyspnea   NEURO: No MS changes, no motor weakness, no sensory changes   ENDO: No polyuria or polydypsia     PE:  BP 112/68   Pulse 75   Ht 1.803 m (_0 )   Wt 88 kg (194 lb)   BMI 27.06 kg/m2  GENERAL APPEARANCE: well developed, well nourished, no acute distress; aware and alert  HEENT: PERLA, EOMI, no lid lag, pink conjunctiva   NECK: supple, no thyromegaly, no lymphadenopathy  HEART: RRR, normal S1, S2, no MRGT, no JVD or carotid bruit  CHEST: lungs clear to auscultation bilaterally  EXTREMITIES: no clubbing, cyanosis, or edema. Pedal pulses 2+ bilaterally. Both feet clean without deformity,  callous, ulceration, or fungal infection.  NEUROLOGICAL: Alert and oriented x 3. Sensitive to monofilament.  SKIN:  No rashes or striae.      Lab Review    HbA1c - 7.9%    Jon [name] is a 83 y.o. male with Uncontrolled type 2 diabetes mellitus. The patient's HbA1c is reasonable for his age.       PLAN:      TESTS:    None    TREATMENT: No changes    RETURN IN:  As needed    Thank you for allowing me to participate in the care of this patient.  Please feel free to contact me if you have any questions or concerns.

## 2017-10-28 ENCOUNTER — Other Ambulatory Visit: Payer: Self-pay | Admitting: Primary Care

## 2017-10-28 NOTE — Telephone Encounter (Signed)
Please refuse, no longer your patient.

## 2017-11-04 ENCOUNTER — Other Ambulatory Visit: Payer: Self-pay | Admitting: Primary Care

## 2017-11-04 DIAGNOSIS — E119 Type 2 diabetes mellitus without complications: Secondary | ICD-10-CM

## 2017-11-04 NOTE — Telephone Encounter (Signed)
Please refuse

## 2017-11-11 ENCOUNTER — Other Ambulatory Visit: Payer: Self-pay | Admitting: Primary Care

## 2017-11-11 ENCOUNTER — Telehealth: Payer: Self-pay | Admitting: Primary Care

## 2017-11-11 DIAGNOSIS — E119 Type 2 diabetes mellitus without complications: Secondary | ICD-10-CM

## 2017-11-11 NOTE — Telephone Encounter (Signed)
No, he is no longer at our practice.

## 2017-11-11 NOTE — Telephone Encounter (Signed)
Last office visit 11/20/2016.  Is he still you patient.. Looks like he is in [place]

## 2017-11-12 MED ORDER — APIXABAN 5 MG PO TABS *I*
5.0000 mg | ORAL_TABLET | Freq: Two times a day (BID) | ORAL | 3 refills | Status: DC
Start: 2017-11-12 — End: 2018-11-10

## 2017-11-12 MED ORDER — METFORMIN HCL 1000 MG PO TABS *I*
1000.0000 mg | ORAL_TABLET | Freq: Two times a day (BID) | ORAL | 3 refills | Status: DC
Start: 2017-11-12 — End: 2018-10-25

## 2017-11-12 NOTE — Addendum Note (Signed)
Addended by: Georgiann Mccoy R on: 11/12/2017 04:00 PM     Modules accepted: Orders

## 2017-11-12 NOTE — Addendum Note (Signed)
Addended by: Purcell Nails on: 11/12/2017 03:02 PM     Modules accepted: Orders

## 2017-11-12 NOTE — Addendum Note (Signed)
Addended by: Jeralyn Bennett on: 11/12/2017 02:39 PM     Modules accepted: Orders

## 2017-11-12 NOTE — Telephone Encounter (Signed)
Patient is completely out of these attached medications. It looks like [facility] sent it to the wrong office. Please approve ASAP.    Patient also wondering what the results were of the xray he had last month

## 2017-11-20 NOTE — Telephone Encounter (Signed)
Patient's daughter called and states that the patient has decided to call an acupuncturist, as he has had good luck with that in the past.  Patient will update our office after seeing his acupuncturist.

## 2017-11-20 NOTE — Telephone Encounter (Signed)
Has not read mychart msg re imaging results of back; pls call with results (see my chart msg).  (Routing comment)

## 2017-11-20 NOTE — Telephone Encounter (Signed)
Called and spoke with both the pt and the pts daughter. I read the MyChart to them from Dr Jolaine Click. The pts daughter said she would talk the options over with her parents and then call back with the decision.

## 2017-11-22 ENCOUNTER — Encounter: Payer: Self-pay | Admitting: Gastroenterology

## 2017-12-09 ENCOUNTER — Other Ambulatory Visit: Payer: Self-pay | Admitting: Primary Care

## 2017-12-09 NOTE — Telephone Encounter (Signed)
Please refuse, no longer yours

## 2017-12-19 ENCOUNTER — Other Ambulatory Visit: Payer: Self-pay | Admitting: Primary Care

## 2017-12-20 MED ORDER — ROSUVASTATIN CALCIUM 10 MG PO TABS *I*
10.0000 mg | ORAL_TABLET | Freq: Every day | ORAL | 3 refills | Status: DC
Start: 2017-12-20 — End: 2017-12-23

## 2017-12-20 NOTE — Telephone Encounter (Signed)
LOV 09/23/17

## 2017-12-23 ENCOUNTER — Ambulatory Visit: Payer: Medicare (Managed Care) | Attending: Primary Care | Admitting: Primary Care

## 2017-12-23 ENCOUNTER — Encounter: Payer: Self-pay | Admitting: Primary Care

## 2017-12-23 ENCOUNTER — Telehealth: Payer: Self-pay | Admitting: Primary Care

## 2017-12-23 VITALS — BP 122/70 | HR 88 | Temp 97.8°F | Ht 70.87 in | Wt 193.8 lb

## 2017-12-23 DIAGNOSIS — E7800 Pure hypercholesterolemia, unspecified: Secondary | ICD-10-CM

## 2017-12-23 DIAGNOSIS — J40 Bronchitis, not specified as acute or chronic: Secondary | ICD-10-CM

## 2017-12-23 DIAGNOSIS — J209 Acute bronchitis, unspecified: Secondary | ICD-10-CM

## 2017-12-23 MED ORDER — DOXYCYCLINE HYCLATE 100 MG PO CAPS *I*
100.0000 mg | ORAL_CAPSULE | Freq: Two times a day (BID) | ORAL | 0 refills | Status: AC
Start: 2017-12-23 — End: 2017-12-28

## 2017-12-23 MED ORDER — AZITHROMYCIN 250 MG PO TABS *I*
ORAL_TABLET | ORAL | 0 refills | Status: AC
Start: 2017-12-23 — End: 2017-12-28

## 2017-12-23 MED ORDER — ROSUVASTATIN CALCIUM 10 MG PO TABS *I*
10.0000 mg | ORAL_TABLET | Freq: Every day | ORAL | 3 refills | Status: DC
Start: 2017-12-23 — End: 2018-12-03

## 2017-12-23 NOTE — Progress Notes (Signed)
[facility] Office Visit Note    Chief Complaint   Patient presents with    Other     Acute - congestion/cough over a week       Subjective:  Jon [name] is a 84 y.o. male here for:    2 weeks of cough, congestion, yellow mucous, L cheek pain, no fever or chills  Has appt to see dentist today, thinks may have a tooth infection.    ROS: Per HPI    Patient's medications, allergies, and problem list were reviewed.        Current Outpatient Prescriptions:     rosuvastatin (CRESTOR) 10 MG tablet, Take 1 tablet (10 mg total) by mouth daily (with dinner), Disp: 90 tablet, Rfl: 3    apixaban (ELIQUIS) 5 MG tablet, Take 1 tablet (5 mg total) by mouth 2 times daily, Disp: 180 tablet, Rfl: 3    metFORMIN (GLUCOPHAGE) 1000 MG tablet, Take 1 tablet (1,000 mg total) by mouth 2 times daily (with meals), Disp: 180 tablet, Rfl: 3    memantine (NAMENDA) 10 MG tablet, Take 1 tablet (10 mg total) by mouth 2 times daily, Disp: 180 tablet, Rfl: 3    Sacubitril-Valsartan (ENTRESTO) 24-26 MG per tablet, Take 1 tablet by mouth daily, Disp: , Rfl:     liraglutide (VICTOZA) 18 MG/3ML injection, Inject 1.8 mg into the skin daily, Disp: 27 mL, Rfl: 3    mirabegron (MYRBETRIQ) 50 MG 24 hr tablet, Take 1 tablet (50 mg total) by mouth daily, Disp: 90 tablet, Rfl: 2    BD PEN NEEDLE NANO U/F 32G X 4 MM, USE ONE TIME EVERY DAY AS DIRECTED WITH VICTOZA PENS, Disp: 100 each, Rfl: 3    finasteride (PROSCAR) 5 MG tablet, TAKE ONE TABLET BY MOUTH ONCE DAILY FOR USE BY MEN ONLY, Disp: 90 tablet, Rfl: 3    MYRBETRIQ 50 MG 24 hr tablet, TAKE ONE TABLET BY MOUTH ONCE DAILY, Disp: 90 tablet, Rfl: 2    metoprolol (TOPROL-XL) 25 MG 24 hr tablet, Take 25 mg by mouth daily   Do not crush or chew. May be divided., Disp: , Rfl:     ipratropium (ATROVENT) 0.03 % nasal spray, 2 sprays by Each Nare route 3 times daily, Disp: 30 mL, Rfl: 1    FREESTYLE LITE test strip, TO BE USED TWO TIMES DAILY TO TEST BLOOD  SUGAR, Disp: 100 strip, Rfl: 4    blood glucose monitor (FREESTYLE LITE) kit, Use as instructed, Disp: 1 each, Rfl: 0    lancets (ONETOUCH ULTRASOFT), Use   2  times per day as instructed for blood glucose testing., Disp: 100 each, Rfl: 3    Blood Glucose Calibration (OT ULTRA/FASTTK CNTRL SOLN) SOLN, As directed, Disp: 1 each, Rfl: 5    CINNAMON PO, Take by mouth, Disp: , Rfl:     cyanocobalamin (VITAMIN B-12) 1000 MCG tablet, Take 1,000 mcg by mouth daily, Disp: , Rfl:     Co-Enzyme Q-10 100 MG CAPS, Take by mouth, Disp: , Rfl:     Multiple Vitamins-Minerals (MULTI VITAMIN/MINERALS PO), Take by mouth, Disp: , Rfl:     HYDROcodone-acetaminophen (VICODIN) 5-500 MG per tablet, 1-2 po q 6 hours prn pain MDD=6, Disp: 180 tablet, Rfl: 1    cholecalciferol (VITAMIN D) 1000 UNIT tablet, Take 2,000 Units by mouth daily    , Disp: , Rfl:     FISH OIL, by Does not apply route daily., Disp: , Rfl:  acetaminophen (TYLENOL) 500 MG tablet, Take 500 mg by mouth 2 times daily., Disp: , Rfl:     azithromycin (ZITHROMAX) 250 MG tablet, Take 2 tablets (500 mg) on day 1, followed by 1 tablet (250 mg) on days 2 through 5., Disp: 6 tablet, Rfl: 0    Objective:  Physical exam:  BP 122/70    Pulse 88    Temp 36.6 C (97.8 F) (Temporal)    Ht 1.8 m (5' 10.87")    Wt 87.9 kg (193 lb 12.8 oz)    SpO2 98%    BMI 27.13 kg/m   General appearance: Well appearing, in NAD.  HEENT: Bilateral  anterior cervical adenopathy. Pharynx without erythema, exudate. TM normal appearing bilat. Mild TTP L maxillary sinus  Cardiovascular:  Regular rate and rhythm, no murmurs, rubs, or gallops.     Pulmonary:  Lungs clear to auscultation bilaterally, without wheezes, rales or rhonchi.  Skin: Warm and dry. No rash.  Neuro:  Alert. CN II-XII grossly intact.   No LE edema    Assessment/Plan:  1. Bronchitis, acute >2 week duration symptoms  - azithromycin (ZITHROMAX) 250 MG tablet; Take 2 tablets (500 mg) on day 1, followed by 1 tablet (250 mg) on  days 2 through 5.  Dispense: 6 tablet; Refill: 0    2. Pure hypercholesterolemia  - rosuvastatin (CRESTOR) 10 MG tablet; Take 1 tablet (10 mg total) by mouth daily (with dinner)  Dispense: 90 tablet; Refill: 3    3, L maxillary sinus tenderness. No signs skin infection or acute sinusitis. Patient will see dentist this afternoon to evaluate dental infection or caries.     Return if symptoms worsen or fail to improve.    Georgiann Mccoy, MD, MPH  Family Medicine

## 2017-12-23 NOTE — Telephone Encounter (Signed)
Called and spoke with pharmacist, they will discontinue the Zithromax and look for the Doxycycline script that was just sent over.

## 2017-12-23 NOTE — Telephone Encounter (Signed)
Azithromycin replaced with doxycycline due to risk for QT prolongation. Will ask nurse to notify pharmacy.     Georgiann Mccoy, MD, MPH  [facility]

## 2017-12-23 NOTE — Telephone Encounter (Signed)
Pharmacy called to make Dr Jolaine Click aware of  a potential drug interaction.. Patient is on tikosyn for AFib.  The Zpack that was sent today can cause QT prolongation.  Pharmacy will hold off giving the Rx until they hear from Dr Jolaine Click.  Please call [place] at  [phone]

## 2017-12-25 LAB — UNMAPPED LAB RESULTS
Basophil # (HT): 0.1 10 3/uL — NL (ref 0.0–0.2)
Basophil % (HT): 0.5 % — NL (ref 0.0–1.8)
Eosinophil # (HT): 0.2 10 3/uL — NL (ref 0.0–0.5)
Eosinophil % (HT): 2.1 % — NL (ref 0.0–7.0)
Hematocrit (HT): 38.3 % — ABNORMAL LOW (ref 40.0–52.0)
Hemoglobin (HGB) (HT): 12.8 g/dL — ABNORMAL LOW (ref 13.0–17.5)
Lymphocyte # (HT): 2 10 3/uL — NL (ref 0.9–3.8)
Lymphocyte % (HT): 21.7 % — NL (ref 17.0–44.0)
MCHC (HT): 33.4 g/dL — NL (ref 32.0–36.0)
MCV (HT): 91.4 FL — NL (ref 81.0–99.0)
Mean Corpuscular Hemoglobin (MCH) (HT): 30.5 pg — NL (ref 26.0–34.0)
Monocyte # (HT): 0.8 10 3/uL — NL (ref 0.2–1.0)
Monocyte % (HT): 9 % — NL (ref 4.0–12.0)
Neutrophil # (HT): 6.2 10 3/uL — NL (ref 1.5–7.7)
Platelets (HT): 182 10 3/uL — NL (ref 140–400)
RBC (HT): 4.19 10 6/uL — ABNORMAL LOW (ref 4.20–5.90)
RDW (HT): 11.7 % — NL (ref 11.5–15.0)
Seg Neut % (HT): 66.7 % — NL (ref 40.0–75.0)
WBC (HT): 9.2 10 3/uL — NL (ref 4.0–10.8)

## 2018-01-01 ENCOUNTER — Encounter: Payer: Self-pay | Admitting: Gastroenterology

## 2018-02-08 ENCOUNTER — Other Ambulatory Visit: Payer: Self-pay | Admitting: Primary Care

## 2018-02-10 ENCOUNTER — Other Ambulatory Visit: Payer: Self-pay | Admitting: Primary Care

## 2018-02-10 MED ORDER — FINASTERIDE 5 MG PO TABS *I*
5.0000 mg | ORAL_TABLET | Freq: Every day | ORAL | 3 refills | Status: DC
Start: 2018-02-10 — End: 2019-02-09

## 2018-02-14 IMAGING — DX DG CHEST 2V
2 series · 2 of 2 positions shown · non-contrast
Comparison: None.

CLINICAL DATA: Lightheadedness, dizziness, fatigue and history of
heart failure.

EXAM:
CHEST  2 VIEW

[w chest pa]
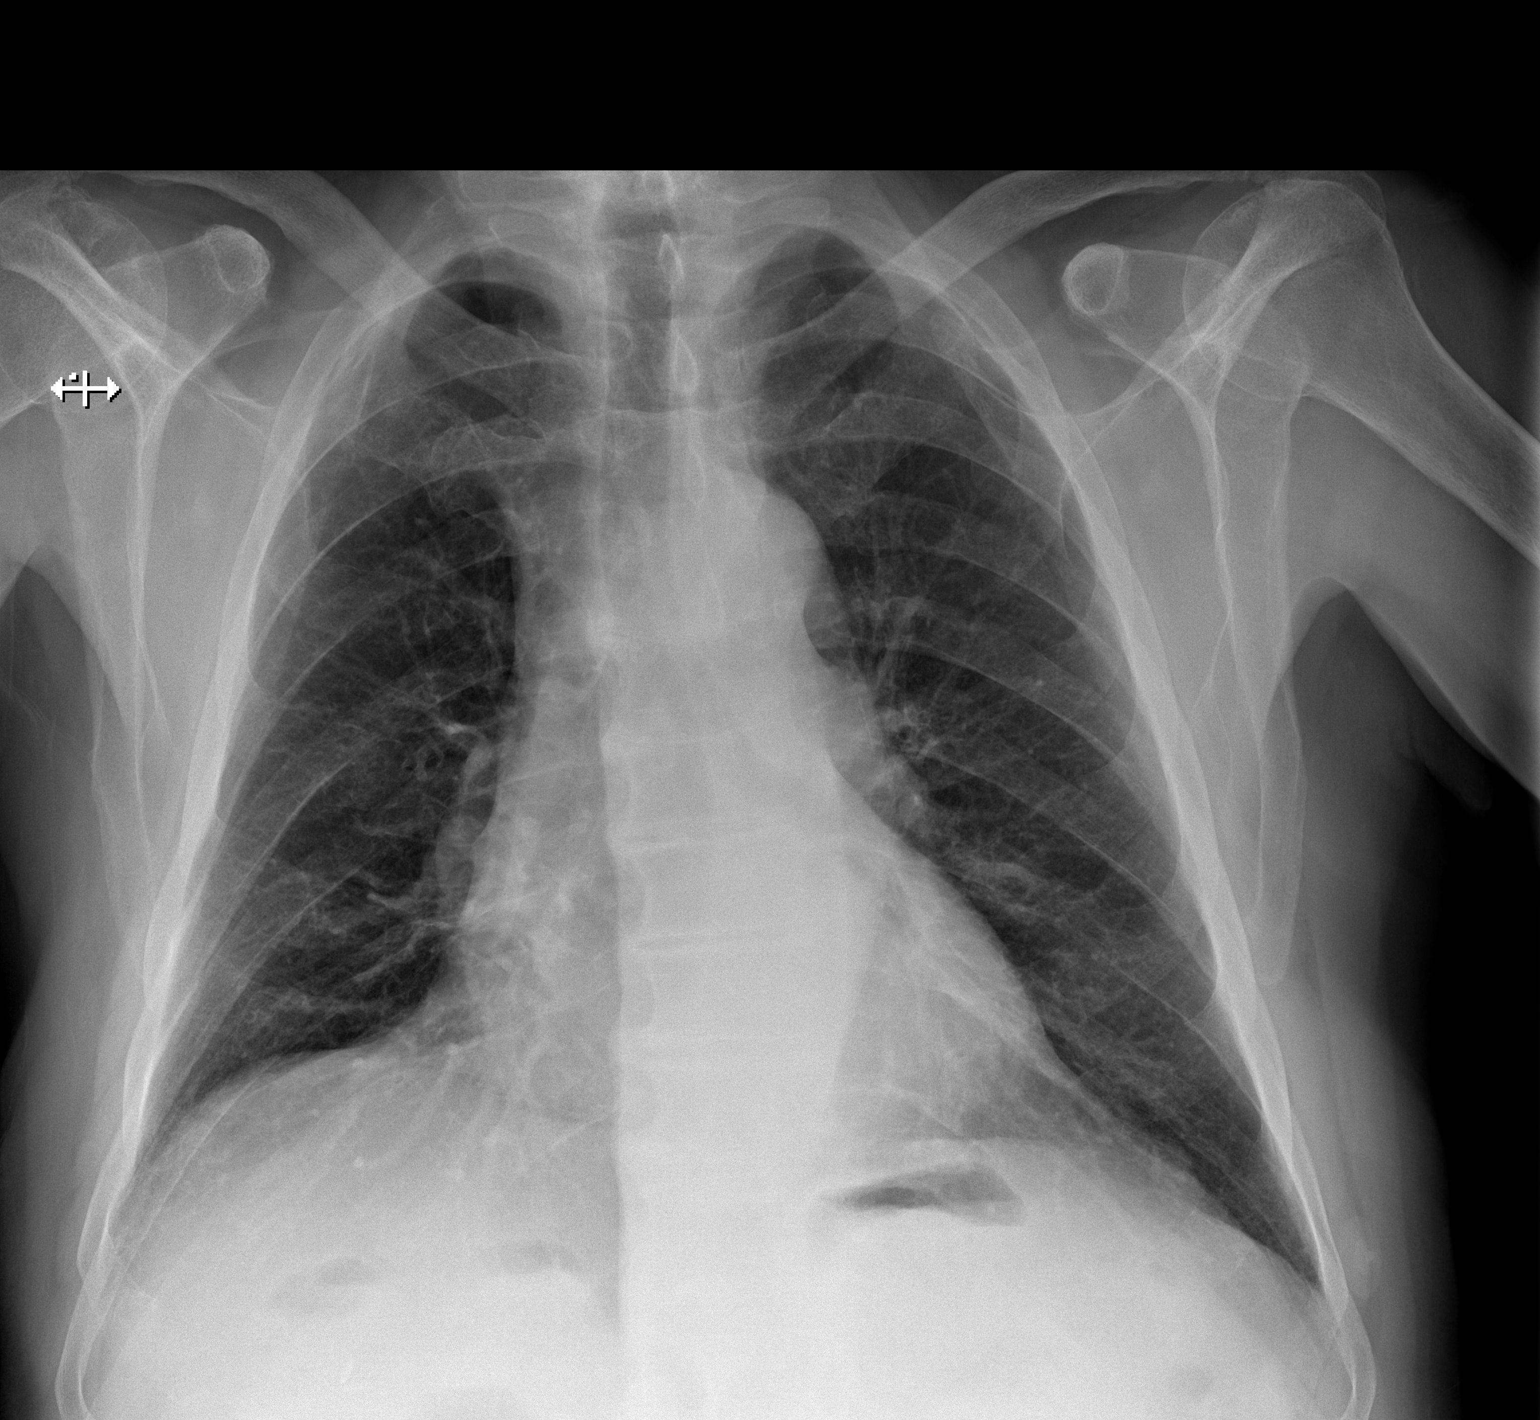

[w chest lat]
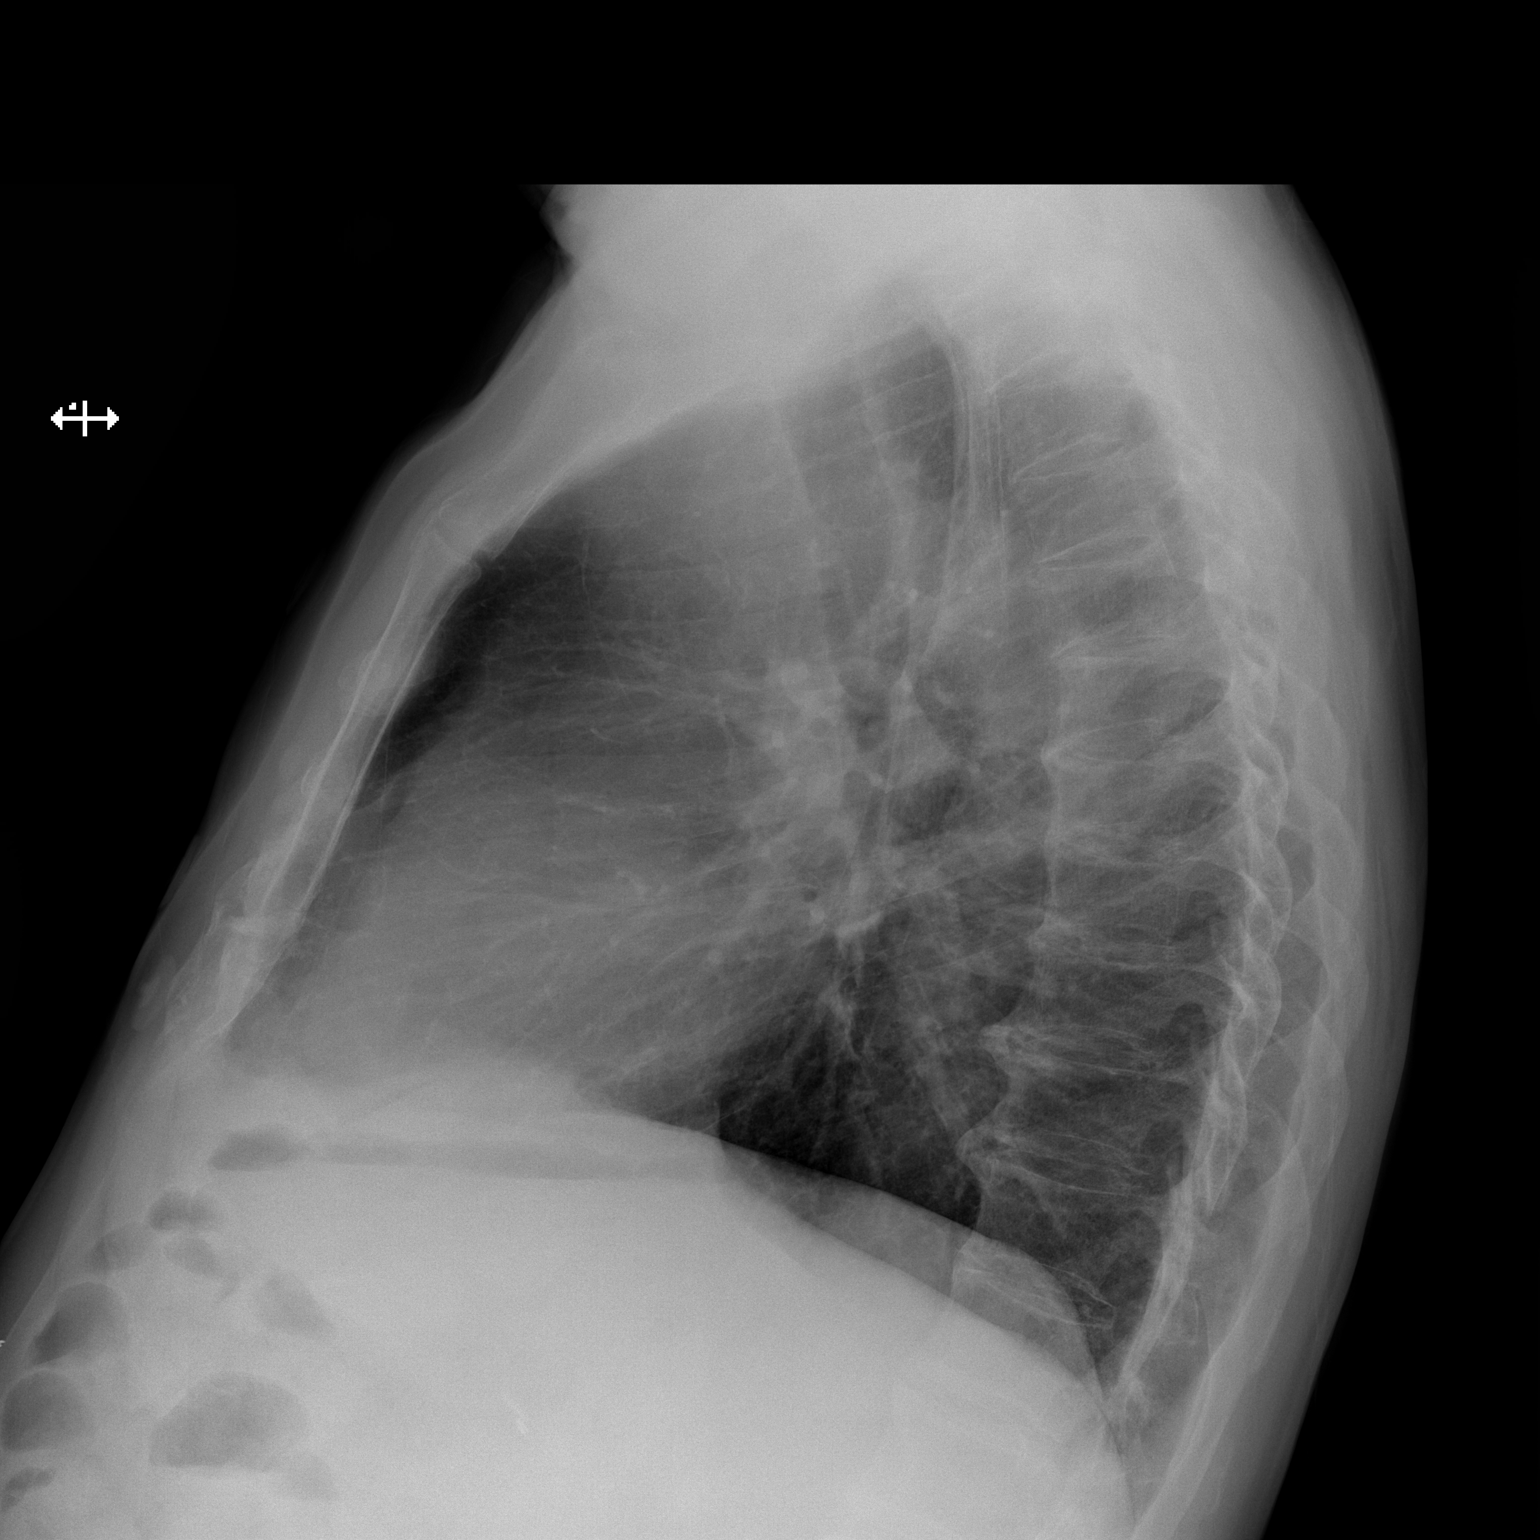

[2 of 2 positions shown; findings below may reference images not displayed]

FINDINGS: The heart size is normal. The aorta is mildly ectatic. There is no
evidence of pulmonary edema, consolidation, pneumothorax, nodule or
pleural fluid. The bony thorax demonstrates degenerative disc
disease of the thoracic spine and mild generalized osteopenia.
IMPRESSION: No active cardiopulmonary disease.

## 2018-03-14 IMAGING — MR MR CARD MORPHOLOGY WO/W CM
10 of 11 series · 39 of 40 positions shown · IV contrast (multihance)
Comparison: none

CLINICAL DATA: Cardiomyopathy of uncertain etiology.

EXAM:
CARDIAC MRI
TECHNIQUE: The patient was scanned on a 1.5 Tesla GE magnet. A dedicated
cardiac coil was used. Functional imaging was done using Fiesta
sequences. [DATE], and 4 chamber views were done to assess for RWMA's.
Modified Opung rule using a short axis stack was used to
calculate an ejection fraction on a dedicated work station using
Circle software. The patient received 30 cc of Multihance. After 10
minutes inversion recovery sequences were used to assess for
infiltration and scar tissue.
CONTRAST:  30 cc Multihance

[Series 4: bSSFP · sagittal · 8.0mm · 1.48mm/px · 1 of 17 slices shown (1 of 5)]
[im 1/17]
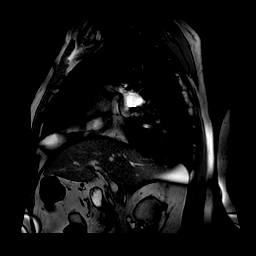

[Series 5: bSSFP · axial · 8.0mm · 1.37mm/px · 1 of 20 slices shown (2 of 5)]
[im 1/20]
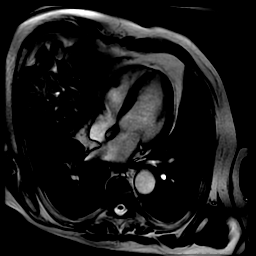

[Series 6: bSSFP · axial · 8.0mm · 1.37mm/px · z∈[-67,-11]mm · 8 of 180 slices shown (3 of 5)]
[im 1/180]
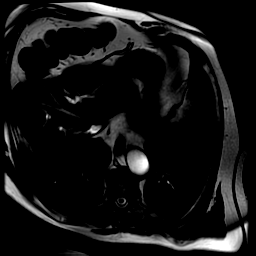
[im 26/180]
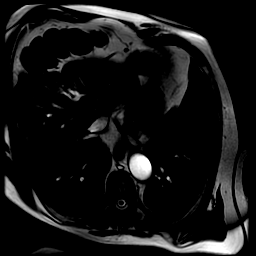
[im 52/180]
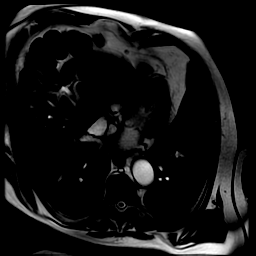
[im 77/180]
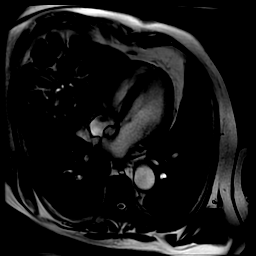
[im 103/180]
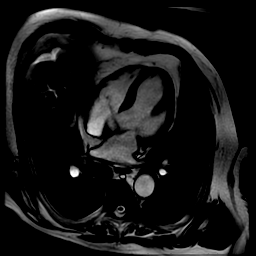
[im 128/180]
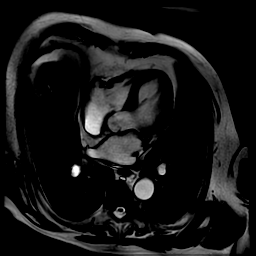
[im 154/180]
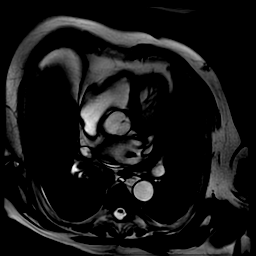
[im 180/180]
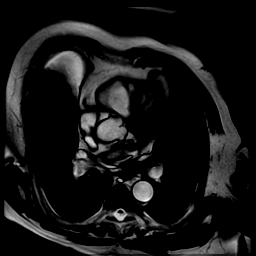

[Series 7: bSSFP · coronal · 8.0mm · 1.37mm/px · 16 of 320 slices shown (4 of 5)]
[im 1/320]
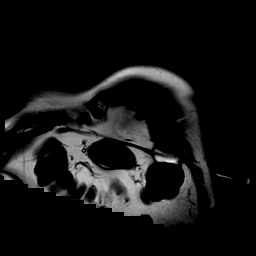
[im 22/320]
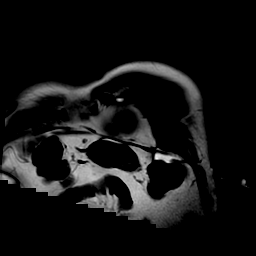
[im 43/320]
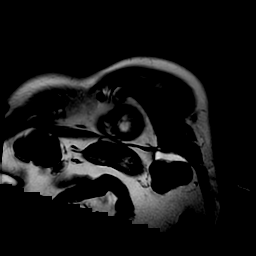
[im 64/320]
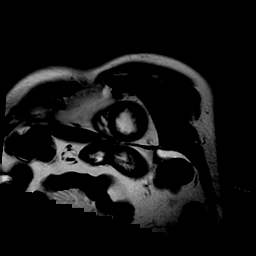
[im 86/320]
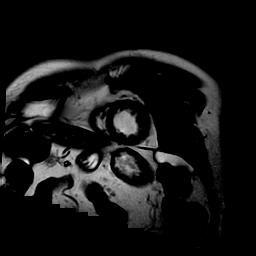
[im 107/320]
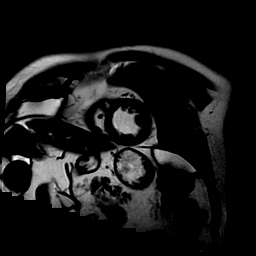
[im 128/320]
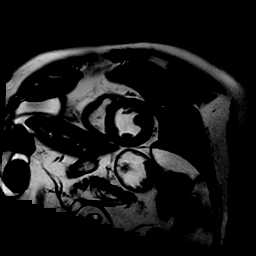
[im 149/320]
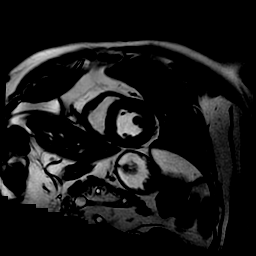
[im 171/320]
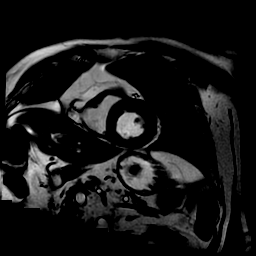
[im 192/320]
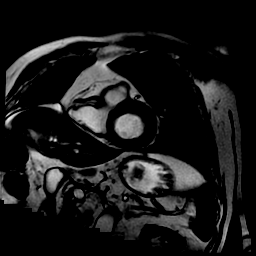
[im 213/320]
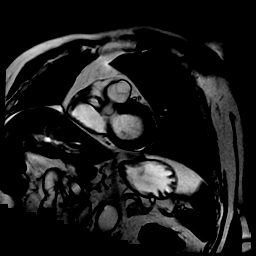
[im 234/320]
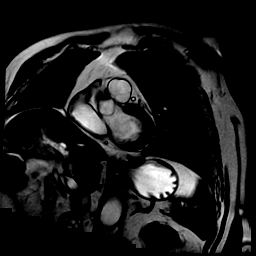
[im 256/320]
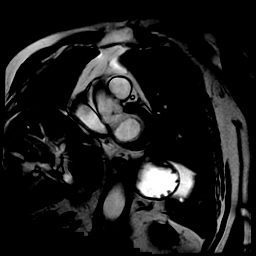
[im 277/320]
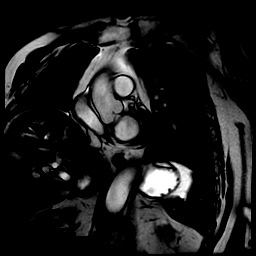
[im 298/320]
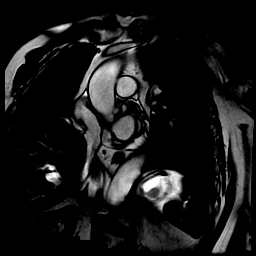
[im 320/320]
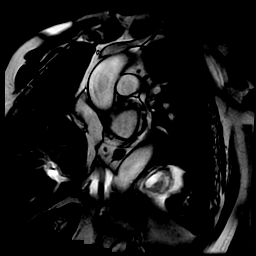

[Series 8: tags grid sa · coronal · 8.0mm · 1.48mm/px · 3 of 60 slices shown]
[im 1/60]
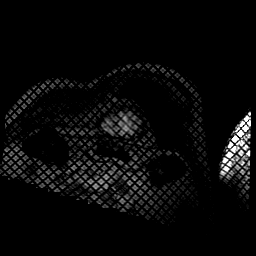
[im 30/60]
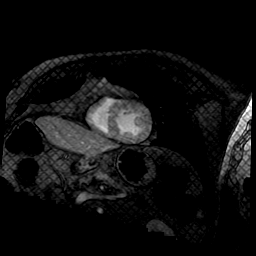
[im 60/60]
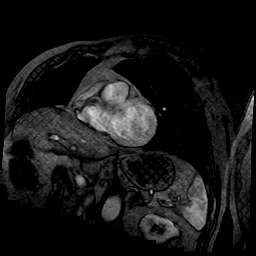

[Series 9: tags grid rad · axial · 8.0mm · 1.37mm/px · z∈[-15,+148]mm · 3 of 60 slices shown]
[im 1/60]
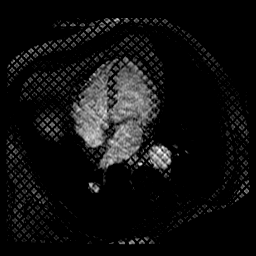
[im 30/60]
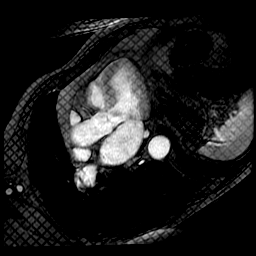
[im 60/60]
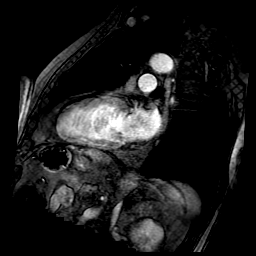

[Series 11: bSSFP · axial · 8.0mm · 1.45mm/px · z∈[-20,+153]mm · 3 of 60 slices shown (5 of 5)]
[im 1/60]
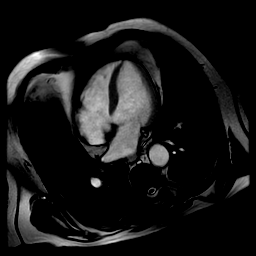
[im 30/60]
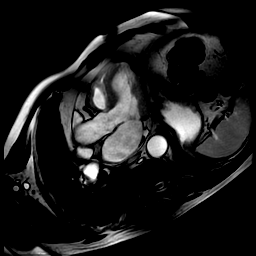
[im 60/60]
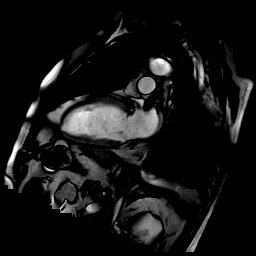

[Series 12: cine ir · coronal · 8.0mm · 1.37mm/px · 2 of 30 slices shown]
[im 1/30]
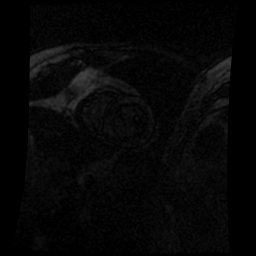
[im 30/30]
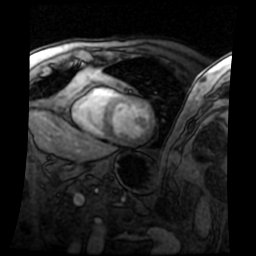

[Series 15: delayed ir prep · coronal · 8.0mm · 1.37mm/px · 1 of 13 slices shown]
[im 1/13]
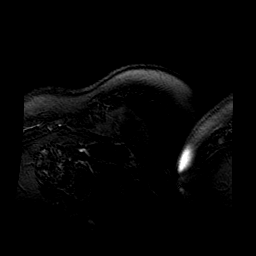

[Series 16: rad mde · axial · 8.0mm · 1.45mm/px · 1 of 3 slices shown]
[im 1/3]
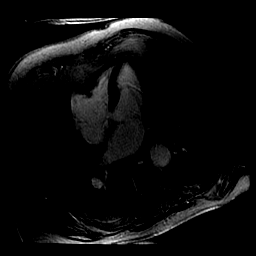

[39 of 40 positions shown; findings below may reference images not displayed]

FINDINGS: Limited images of the lung fields show no significant abnormalities.

Normal left ventricular size and wall thickness. EF 39%. Moderate
diffuse hypokinesis. There is mild septal-lateral dyssynchrony. The
right ventricle is normal in size and systolic function. Moderate
left atrial enlargement. Borderline right atrial enlargement.
Lipomatous atrial septal hypertrophy. Trileaflet aortic valve with
no stenosis, trivial aortic insufficiency. Mild mitral
regurgitation.

On delayed enhancement imaging, no myocardial late gadolinium
enhancement (LGE) was noted.

MEASUREMENTS:
MEASUREMENTS
LVEDV 152 mL

LVSV 59 mL

LVEF 39%
IMPRESSION: 1. Normal LV size with moderate diffuse hypokinesis, EF 39%. There
is mild septal-lateral dyssynchrony.

2.  Normal RV size and systolic function.

3. No myocardial LGE, so no definitive evidence for prior MI,
infiltrative disease, or myocarditis.

Erasto Hornung

## 2018-03-24 ENCOUNTER — Ambulatory Visit: Payer: Medicare (Managed Care) | Admitting: Primary Care

## 2018-04-08 ENCOUNTER — Encounter: Payer: Self-pay | Admitting: Primary Care

## 2018-04-08 DIAGNOSIS — I77810 Thoracic aortic ectasia: Secondary | ICD-10-CM | POA: Insufficient documentation

## 2018-04-09 ENCOUNTER — Encounter: Payer: Self-pay | Admitting: Gastroenterology

## 2018-06-16 ENCOUNTER — Other Ambulatory Visit: Payer: Self-pay | Admitting: Gastroenterology

## 2018-06-17 ENCOUNTER — Encounter: Payer: Self-pay | Admitting: Gastroenterology

## 2018-06-21 ENCOUNTER — Other Ambulatory Visit: Payer: Self-pay | Admitting: Urology

## 2018-06-22 NOTE — Telephone Encounter (Signed)
The patient was seen 09/19/2015 with plan below.   No follow up appointments scheduled.     Patient can schedule an appointment or request refills from his PCP.       Diagnosis established -BPH with LUTS,  pt's memory issues and h/o vascular dementia currently on every day myrbetriq; glucosuria  Plan of care -continue myrbetriq - but increase dose to bid and if improves urgency and UI then will change Rx      Instructions         Return in about 1 year (around 09/18/2016) for call for new Myrbetriq if increased dose helpful

## 2018-06-25 ENCOUNTER — Other Ambulatory Visit: Payer: Self-pay | Admitting: Endocrinology

## 2018-07-01 LAB — UNMAPPED LAB RESULTS
Hematocrit (HT): 35.5 % — ABNORMAL LOW (ref 40.0–52.0)
Hemoglobin (HGB) (HT): 12 g/dL — ABNORMAL LOW (ref 13.0–17.5)
MCHC (HT): 33.8 g/dL — NL (ref 32.0–36.0)
MCV (HT): 89.4 FL — NL (ref 81.0–99.0)
Mean Corpuscular Hemoglobin (MCH) (HT): 30.2 pg — NL (ref 26.0–34.0)
Platelets (HT): 154 10 3/uL — NL (ref 140–400)
RBC (HT): 3.97 10 6/uL — ABNORMAL LOW (ref 4.20–5.90)
RDW (HT): 12.4 % — NL (ref 11.5–15.0)
WBC (HT): 6.3 10 3/uL — NL (ref 4.0–10.8)

## 2018-07-12 ENCOUNTER — Other Ambulatory Visit: Payer: Self-pay | Admitting: Urology

## 2018-09-28 ENCOUNTER — Other Ambulatory Visit: Payer: Self-pay | Admitting: Primary Care

## 2018-09-28 DIAGNOSIS — Z0000 Encounter for general adult medical examination without abnormal findings: Secondary | ICD-10-CM

## 2018-09-30 NOTE — Telephone Encounter (Signed)
Routine physical

## 2018-10-25 ENCOUNTER — Other Ambulatory Visit: Payer: Self-pay | Admitting: Primary Care

## 2018-10-25 DIAGNOSIS — E119 Type 2 diabetes mellitus without complications: Secondary | ICD-10-CM

## 2018-10-27 NOTE — Telephone Encounter (Signed)
Follow up.

## 2018-11-09 ENCOUNTER — Other Ambulatory Visit: Payer: Self-pay | Admitting: Primary Care

## 2018-11-10 NOTE — Telephone Encounter (Signed)
Medication follow up needed. JMA

## 2018-11-14 ENCOUNTER — Other Ambulatory Visit: Payer: Self-pay | Admitting: Endocrinology

## 2018-12-03 ENCOUNTER — Other Ambulatory Visit: Payer: Self-pay | Admitting: Primary Care

## 2018-12-03 DIAGNOSIS — E7800 Pure hypercholesterolemia, unspecified: Secondary | ICD-10-CM

## 2018-12-22 ENCOUNTER — Encounter: Payer: Self-pay | Admitting: Family Medicine

## 2018-12-22 ENCOUNTER — Ambulatory Visit: Payer: Medicare (Managed Care) | Attending: Family Medicine | Admitting: Family Medicine

## 2018-12-22 VITALS — BP 112/60 | HR 76 | Ht 70.87 in | Wt 200.0 lb

## 2018-12-22 DIAGNOSIS — E119 Type 2 diabetes mellitus without complications: Secondary | ICD-10-CM

## 2018-12-22 DIAGNOSIS — I509 Heart failure, unspecified: Secondary | ICD-10-CM

## 2018-12-22 DIAGNOSIS — F0150 Vascular dementia without behavioral disturbance: Secondary | ICD-10-CM

## 2018-12-22 NOTE — Progress Notes (Signed)
Patient ID:  PENIEL HASS  02/06/1934    Subjective:    CC:  Medication Problem/Question    HPI    Patient's here today requesting refill of medications.  He's not been in the office in over a year and not had lab work done in a couple of years.  He's here today with his wife.      Patient's wife reports that he is compliant with his medical regimen.  She keeps his medications filled) stem for him daily.  He is continuing on metformin and Victoza as prescribed.  He's not had an A1c done for almost 2 years.  Patient and wife it minute and agrees that he is not for disciplined about his diet.  Wife fertilizers a good understanding of diabetic diet.    Patient suggest and wife confirms that patient is exercising a few times a week at the [facility] he does continue to golf periodically.     Wife suggests that cognition continues to decline patient denies this.  He does admit that he got lost trying to drive here today causing him to be a few minutes late.  He does manage social events and some retirement events but spouse is responsible for most of his day-to-day appointments in making sure he is compliant with visits and his medication regimen.  Patient had had some adverse effects to Aricept continues on Namenda.    He continues to be following up with cardiology regularly.  Last visit was last month.  He's being managed for both ventricular heart failure and atrial fibrillation.  Last ejection fraction was about 55% EKG shows continued ventricular irregular rhythm    Jasiel has a current medication list which includes the following prescription(s): rosuvastatin, victoza, eliquis, amoxicillin, metformin, memantine, bd pen needle nano u/f, finasteride, dofetilide, sacubitril-valsartan, myrbetriq, metoprolol, ipratropium, freestyle lite, blood glucose monitor, lancets, ot ultra/fasttk cntrl soln, cinnamon, cyanocobalamin, co-enzyme q-10, multiple vitamins-minerals, hydrocodone-acetaminophen, cholecalciferol, fish oil,  and acetaminophen.  Keymarion is allergic to bee venom; no known latex allergy; and insects [other].      Medications and allergies were reviewed and reconciled with the patient in eRecord today; and No changes were made.      Review of Systems   Respiratory: Negative for cough.    Cardiovascular: Negative for chest pain and leg swelling.   Musculoskeletal: Positive for joint pain.   Psychiatric/Behavioral: Positive for memory loss. Negative for depression.         Objective:    Vitals:    12/22/18 0957   BP: 112/60   Pulse: 76   Weight: 90.7 kg (200 lb)   Height: 1.8 m (5' 10.87")     Physical Exam   Constitutional: He is oriented to person, place, and time and well-developed, well-nourished, and in no distress.   HENT:   Head: Normocephalic.   Eyes: Pupils are equal, round, and reactive to light. Conjunctivae are normal.   Neck: Normal range of motion. Neck supple.   Cardiovascular: Normal rate.   Irregular heart rate   Pulmonary/Chest: Effort normal and breath sounds normal. He has no wheezes.   Musculoskeletal:         General: No edema.   Neurological: He is alert and oriented to person, place, and time.   Skin: Skin is warm and dry.   Psychiatric: Mood and affect normal.   Patient's dependence on Y for answering questions regarding appointments at medical's schedule and depends on his wife for medications.  He does support  answers that she helps him with       No results found for this or any previous visit (from the past 24 hour(s)).    Assessment/Plan:  Montae was seen today for medication problem/question.    Diagnoses and all orders for this visit:    Diabetes  -     TSH; Future    Congestive heart failure, unspecified HF chronicity, unspecified heart failure type  -     TSH; Future  -     Vitamin D; Future  -     CBC and differential; Future  -     Iron; Future  -     C reactive protein; Future  -     Hemoglobin A1c; Future  -     Lipid Panel (Reflex to Direct  LDL if Triglycerides more than 400);  Future    Vascular dementia    Patient is taking Namenda.  He is taking Aricept but had some adverse effects and absence of improvement.  Despite place report that he is deteriorating slightly she feels as though referral to geriatric neurology not appropriate at this time.    Return in about 4 months (around 04/22/2019), or if symptoms worsen or fail to improve, for Dr. Jolaine Click.    Romualdo Bolk FNP-C

## 2018-12-23 ENCOUNTER — Other Ambulatory Visit
Admission: RE | Admit: 2018-12-23 | Discharge: 2018-12-23 | Disposition: A | Discharge status: Home / Self Care | Payer: Medicare (Managed Care) | Source: Ambulatory Visit | Attending: Family Medicine | Admitting: Family Medicine

## 2018-12-23 DIAGNOSIS — I509 Heart failure, unspecified: Secondary | ICD-10-CM | POA: Insufficient documentation

## 2018-12-23 DIAGNOSIS — E119 Type 2 diabetes mellitus without complications: Secondary | ICD-10-CM | POA: Insufficient documentation

## 2018-12-23 DIAGNOSIS — Z0000 Encounter for general adult medical examination without abnormal findings: Secondary | ICD-10-CM | POA: Insufficient documentation

## 2018-12-23 LAB — COMPREHENSIVE METABOLIC PANEL
ALT: 22 U/L (ref 0–50)
AST: 21 U/L (ref 0–50)
Albumin: 4.1 g/dL (ref 3.5–5.2)
Alk Phos: 72 U/L (ref 40–130)
Anion Gap: 12 (ref 7–16)
Bilirubin,Total: 0.8 mg/dL (ref 0.0–1.2)
CO2: 25 mmol/L (ref 20–28)
Calcium: 9.9 mg/dL (ref 8.6–10.2)
Chloride: 104 mmol/L (ref 96–108)
Creatinine: 1.43 mg/dL — ABNORMAL HIGH (ref 0.67–1.17)
GFR,Black: 52 * — AB
GFR,Caucasian: 45 * — AB
Glucose: 199 mg/dL — ABNORMAL HIGH (ref 60–99)
Lab: 23 mg/dL — ABNORMAL HIGH (ref 6–20)
Potassium: 5.1 mmol/L (ref 3.3–5.1)
Sodium: 141 mmol/L (ref 133–145)
Total Protein: 6.8 g/dL (ref 6.3–7.7)

## 2018-12-23 LAB — CBC AND DIFFERENTIAL
Baso # K/uL: 0.1 10*3/uL (ref 0.0–0.1)
Basophil %: 0.8 %
Eos # K/uL: 0.3 10*3/uL (ref 0.0–0.5)
Eosinophil %: 4.2 %
Hematocrit: 42 % (ref 40–51)
Hemoglobin: 13.3 g/dL — ABNORMAL LOW (ref 13.7–17.5)
IMM Granulocytes #: 0 10*3/uL
IMM Granulocytes: 0.3 %
Lymph # K/uL: 1.8 10*3/uL (ref 1.3–3.6)
Lymphocyte %: 29.3 %
MCH: 31 pg/cell (ref 26–32)
MCHC: 32 g/dL (ref 32–37)
MCV: 96 fL — ABNORMAL HIGH (ref 79–92)
Mono # K/uL: 0.4 10*3/uL (ref 0.3–0.8)
Monocyte %: 7.3 %
Neut # K/uL: 3.5 10*3/uL (ref 1.8–5.4)
Nucl RBC # K/uL: 0 10*3/uL (ref 0.0–0.0)
Nucl RBC %: 0 /100 WBC (ref 0.0–0.2)
Platelets: 155 10*3/uL (ref 150–330)
RBC: 4.4 MIL/uL — ABNORMAL LOW (ref 4.6–6.1)
RDW: 13 % (ref 11.6–14.4)
Seg Neut %: 58.1 %
WBC: 6 10*3/uL (ref 4.2–9.1)

## 2018-12-23 LAB — CRP: CRP: <1 mg/L (ref 0–10)

## 2018-12-23 LAB — LIPID PANEL
Chol/HDL Ratio: 2.5
Cholesterol: 112 mg/dL
HDL: 44 mg/dL (ref 40–60)
LDL Calculated: 42 mg/dL
Non HDL Cholesterol: 68 mg/dL
Triglycerides: 129 mg/dL

## 2018-12-23 LAB — IRON: Iron: 99 ug/dL (ref 45–170)

## 2018-12-23 LAB — HEMOGLOBIN A1C: Hemoglobin A1C: 9 % — ABNORMAL HIGH

## 2018-12-23 LAB — VITAMIN D: 25-OH Vit Total: 51 ng/mL (ref 30–60)

## 2018-12-23 LAB — TSH: TSH: 7.5 u[IU]/mL — ABNORMAL HIGH (ref 0.27–4.20)

## 2018-12-23 LAB — MULTIPLE ORDERING DOCS

## 2018-12-23 NOTE — Result Encounter Note (Signed)
Review results at next visit

## 2018-12-24 ENCOUNTER — Telehealth: Payer: Self-pay | Admitting: Primary Care

## 2018-12-24 NOTE — Telephone Encounter (Signed)
-----   Message from Windle Guard, NP sent at 12/23/2018  4:59 PM EST -----  Pt. Needs F/U apt to discuss Lab results

## 2018-12-25 ENCOUNTER — Encounter: Payer: Self-pay | Admitting: Gastroenterology

## 2018-12-29 NOTE — Telephone Encounter (Signed)
Writer left a message for patient to call the office to schedule a follow up to discuss lab results. Ok to schedule when he calls back. JMA

## 2019-01-02 ENCOUNTER — Other Ambulatory Visit: Payer: Self-pay | Admitting: Primary Care

## 2019-01-02 MED ORDER — APIXABAN 5 MG PO TABS *I*
5.0000 mg | ORAL_TABLET | Freq: Two times a day (BID) | ORAL | 1 refills | Status: DC
Start: 2019-01-02 — End: 2019-02-23

## 2019-01-02 NOTE — Telephone Encounter (Signed)
lov 12/22/2018  No future visit scheduled   Labs done 12/23/2018

## 2019-01-05 ENCOUNTER — Other Ambulatory Visit: Payer: Self-pay | Admitting: Primary Care

## 2019-01-05 DIAGNOSIS — E119 Type 2 diabetes mellitus without complications: Secondary | ICD-10-CM

## 2019-01-09 ENCOUNTER — Ambulatory Visit: Payer: Medicare (Managed Care) | Attending: Primary Care | Admitting: Primary Care

## 2019-01-09 ENCOUNTER — Encounter: Payer: Self-pay | Admitting: Primary Care

## 2019-01-09 VITALS — BP 112/68 | HR 77 | Temp 97.8°F | Wt 199.6 lb

## 2019-01-09 DIAGNOSIS — R7989 Other specified abnormal findings of blood chemistry: Secondary | ICD-10-CM

## 2019-01-09 DIAGNOSIS — E119 Type 2 diabetes mellitus without complications: Secondary | ICD-10-CM

## 2019-01-09 NOTE — Progress Notes (Signed)
[facility] Medicine Office Visit Note    Chief Complaint   Patient presents with    Lab Results       Subjective:  Jon [name] is a 85 y.o. male here for:    Here to review lab results    Elevated TSH  Patient acknowledges some fatigue. Denies hair or skin changes    Dm2  Has been relaxed with diet; states he is motivated to make changes, which he has laready started doing. Is not interested in starting new medications, feels that he can improved his BGs with diet/exercise alone. Continues current medications. Denies polydipsia, polyuria, dizziness, numbness or tingling in hands or feet.      Elevated creatinine  States he did not drink water in morning of labs   Has been adherent to sacubitril-valsartan      ROS: Per HPI    Patient's medications, allergies, and problem list were reviewed.        Current Outpatient Medications:     dofetilide (TIKOSYN) 250 MCG capsule, Take 250 mcg by mouth 2 times daily, Disp: , Rfl:     metFORMIN (GLUCOPHAGE) 1000 MG tablet, TAKE 1 TABLET BY MOUTH TWO TIMES DAILY WITH MEALS, Disp: 180 tablet, Rfl: 3    apixaban (ELIQUIS) 5 MG tablet, Take 1 tablet (5 mg total) by mouth 2 times daily, Disp: 180 tablet, Rfl: 1    rosuvastatin (CRESTOR) 10 MG tablet, TAKE 1 TABLET BY MOUTH EVERY DAY WITH DINNER, Disp: 90 tablet, Rfl: 3    VICTOZA 18 MG/3ML injection, INJECT 1.8MG UNDER THE SKIN EVERY DAY, Disp: 27 mL, Rfl: 3    memantine (NAMENDA) 10 MG tablet, TAKE 1 TABLET BY MOUTH TWO TIMES DAILY, Disp: 180 tablet, Rfl: 3    BD PEN NEEDLE NANO U/F 32G X 4 MM, USE ONE TIME DAILY AS DIRECTED WITH VICTOZA PENS, Disp: 100 each, Rfl: 3    finasteride (PROSCAR) 5 MG tablet, Take 1 tablet (5 mg total) by mouth daily   For use by men only., Disp: 90 tablet, Rfl: 3    dofetilide (TIKOSYN) 250 MCG capsule, Take 250 mcg by mouth, Disp: , Rfl:     Sacubitril-Valsartan (ENTRESTO) 24-26 MG per tablet, Take 1 tablet by mouth daily, Disp: , Rfl:     MYRBETRIQ 50 MG 24  hr tablet, TAKE ONE TABLET BY MOUTH ONCE DAILY, Disp: 90 tablet, Rfl: 2    metoprolol (TOPROL-XL) 25 MG 24 hr tablet, Take 25 mg by mouth daily   Do not crush or chew. May be divided., Disp: , Rfl:     ipratropium (ATROVENT) 0.03 % nasal spray, 2 sprays by Each Nare route 3 times daily, Disp: 30 mL, Rfl: 1    FREESTYLE LITE test strip, TO BE USED TWO TIMES DAILY TO TEST BLOOD SUGAR, Disp: 100 strip, Rfl: 4    blood glucose monitor (FREESTYLE LITE) kit, Use as instructed, Disp: 1 each, Rfl: 0    lancets (ONETOUCH ULTRASOFT), Use   2  times per day as instructed for blood glucose testing., Disp: 100 each, Rfl: 3    Blood Glucose Calibration (OT ULTRA/FASTTK CNTRL SOLN) SOLN, As directed, Disp: 1 each, Rfl: 5    CINNAMON PO, Take by mouth, Disp: , Rfl:     cyanocobalamin (VITAMIN B-12) 1000 MCG tablet, Take 1,000 mcg by mouth daily, Disp: , Rfl:     Co-Enzyme Q-10 100 MG CAPS, Take by mouth, Disp: , Rfl:  Multiple Vitamins-Minerals (MULTI VITAMIN/MINERALS PO), Take by mouth, Disp: , Rfl:     HYDROcodone-acetaminophen (VICODIN) 5-500 MG per tablet, 1-2 po q 6 hours prn pain MDD=6, Disp: 180 tablet, Rfl: 1    cholecalciferol (VITAMIN D) 1000 UNIT tablet, Take 2,000 Units by mouth daily    , Disp: , Rfl:     FISH OIL, by Does not apply route daily., Disp: , Rfl:     acetaminophen (TYLENOL) 500 MG tablet, Take 500 mg by mouth 2 times daily., Disp: , Rfl:     amoxicillin (AMOXIL) 500 MG capsule, TAKE 4 CAPSULES BY MOUTH ONE HOUR BEFORE DENTAL APPOINTMENTS, Disp: , Rfl: 0    Objective:  Physical exam:  BP 112/68    Pulse 77    Temp 36.6 C (97.8 F)    Wt 90.5 kg (199 lb 9.6 oz)    SpO2 96%    BMI 27.94 kg/m   General appearance: Well appearing, in NAD.   Cardiovascular:  Regular rate and rhythm, no murmurs, rubs, or gallops.     Pulmonary:  Lungs clear to auscultation bilaterally, without wheezes, rales or rhonchi.  Neuro:  Alert. CN II-XII grossly intact.     Assessment/Plan:  1. Type 2 diabetes  mellitus; A1c not at goal; patient motivated to make diet/exercise changes. Will continue current medications.   - repeat A1c in 3 months  - Hemoglobin A1c; Future  - Basic metabolic panel; Future    2. Elevated TSH  - recheck TSH in 3 months; if continues to be elevated, start levothyroxine  - TSH; Future  - T4, free; Future    3. Elevated serum creatinine  - differential includes dehydration; BP at goal today  - increase po water intake prior to next blood draw      Return in about 6 months (around 07/10/2019) for  Yearly Check-up, well ness visit .    Georgiann Mccoy, MD, MPH  Family Medicine

## 2019-01-11 ENCOUNTER — Encounter: Payer: Self-pay | Admitting: Primary Care

## 2019-01-21 ENCOUNTER — Other Ambulatory Visit: Payer: Self-pay | Admitting: Primary Care

## 2019-02-07 ENCOUNTER — Other Ambulatory Visit: Payer: Self-pay | Admitting: Primary Care

## 2019-02-09 MED ORDER — FINASTERIDE 5 MG PO TABS *I*
5.0000 mg | ORAL_TABLET | Freq: Every day | ORAL | 3 refills | Status: DC
Start: 2019-02-09 — End: 2020-02-24

## 2019-02-23 ENCOUNTER — Other Ambulatory Visit: Payer: Self-pay | Admitting: Primary Care

## 2019-02-23 MED ORDER — APIXABAN 5 MG PO TABS *I*
5.0000 mg | ORAL_TABLET | Freq: Two times a day (BID) | ORAL | 1 refills | Status: DC
Start: 2019-02-23 — End: 2019-07-06

## 2019-02-24 ENCOUNTER — Ambulatory Visit: Payer: Medicare (Managed Care) | Admitting: Family Medicine

## 2019-02-26 ENCOUNTER — Ambulatory Visit: Payer: Medicare (Managed Care) | Attending: Family Medicine | Admitting: Family Medicine

## 2019-02-26 ENCOUNTER — Encounter: Payer: Self-pay | Admitting: Family Medicine

## 2019-02-26 VITALS — BP 100/62 | HR 67 | Temp 97.8°F | Ht 70.87 in | Wt 198.5 lb

## 2019-02-26 DIAGNOSIS — M25561 Pain in right knee: Secondary | ICD-10-CM

## 2019-02-26 DIAGNOSIS — M1990 Unspecified osteoarthritis, unspecified site: Secondary | ICD-10-CM

## 2019-02-26 MED ORDER — METHYLPREDNISOLONE ACETATE 40 MG/ML IJ SUSP *I*
40.0000 mg | Freq: Once | INTRAMUSCULAR | Status: AC
Start: 2019-02-26 — End: 2019-02-26
  Administered 2019-02-26: 40 mg via INTRA_ARTICULAR

## 2019-02-26 NOTE — Progress Notes (Signed)
Patient ID:  DALTON MOLESWORTH  06-25-1934    Subjective:    CC:  Knee Pain    HPI    Arthor came in today with a chief compliant of right knee pain. He recently was in [place] where he played golf 5 days in a row, post this he has developed worsening pain in his right knee. He has a history or bilateral knee pain. 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 He states to have no history of trauma or known arthritis to the right knee. The pain is aggravated with daily activities and leisure activities like walking and golf. He has tried absorbine and tylenol which have been helpful making the pain tolerable, but the pain never goes away completely.      Marlowe has a current medication list which includes the following prescription(s): apixaban, finasteride, dofetilide, metformin, rosuvastatin, victoza, amoxicillin, memantine, bd pen needle nano u/f, dofetilide, sacubitril-valsartan, myrbetriq, metoprolol, ipratropium, freestyle lite, blood glucose monitor, lancets, ot ultra/fasttk cntrl soln, cinnamon, cyanocobalamin, co-enzyme q-10, multiple vitamins-minerals, hydrocodone-acetaminophen, cholecalciferol, fish oil, and acetaminophen.  Gwin is allergic to bee venom; no known latex allergy; and insects [other].      Medications and allergies were reviewed and reconciled with the patient in eRecord today; and No changes were made.      Review of Systems   Musculoskeletal: Positive for joint pain (right knee). Negative for back pain, falls, myalgias and neck pain.   Neurological: Negative for dizziness and weakness.         Objective:    Vitals:    02/26/19 1425   BP: 100/62   Pulse: 67   Temp: 36.6 C (97.8 F)   SpO2: 98%   Weight: 90 kg (198 lb 8 oz)   Height: 1.8 m (5' 10.87")     Physical Exam   Constitutional: He is oriented to person, place, and time and well-developed, well-nourished, and in no distress. No  distress.   HENT:   Head: Normocephalic and atraumatic.   Eyes: Pupils are equal, round, and reactive to light.   Neck: Normal range of motion. Neck supple.   Cardiovascular: Normal rate.   Pulmonary/Chest: Effort normal.   Musculoskeletal: Normal range of motion.         General: Tenderness present. No edema.      Comments: Right knee: No edema, nonerythemic, no ecchymosis or signs of trauma. Full extension and flexion, crepitus with knee flexion noted. Moderate quadricep tendon, hamstring tendon and medial femoral head/tibal plateau tenderness.    Neurological: He is alert and oriented to person, place, and time. Gait normal.   Skin: Skin is warm and dry. He is not diaphoretic. No erythema.   Psychiatric: Mood, memory, affect and judgment normal.       No results found for this or any previous visit (from the past 24 hour(s)).    Assessment/Plan:  Erick was seen today for right knee pain.    Diagnoses and all orders for this visit:    Pain in right knee    After consent was obtained,  L knee is  prepped by washing anterior aspect of the 3 times with alcohol.  Using sterile technique Steroid 40 mg  was injected with 25 g , 1" needle to the medial joint space, then the needle was withdrawn.  The procedure was well tolerated.  The patient is asked to  continue to ambulate with caution for the next days then resume regular activities.  It may be more painful for the first 1-2 days.  Watch for fever, or increased swelling or persistent pain in the joint. Call or return to clinic prn if such symptoms occur or there is failure to improve as anticipated.    - educated patient on straight leg raise exercises to increase quadriceps strength  - encouraged patient to increase aerobic activities such as bike riding to increase ROM and leg strength      Return if symptoms worsen or fail to improve, for Reassessment, Evaluate treatment plan.    Romualdo Bolk FNP-C

## 2019-04-02 ENCOUNTER — Other Ambulatory Visit: Payer: Self-pay | Admitting: Urology

## 2019-04-03 MED ORDER — MIRABEGRON ER 50 MG PO TB24 *I*
50.0000 mg | ORAL_TABLET | Freq: Every day | ORAL | 2 refills | Status: DC
Start: 2019-04-03 — End: 2021-01-19

## 2019-06-05 ENCOUNTER — Other Ambulatory Visit: Payer: Self-pay | Admitting: Endocrinology

## 2019-06-05 DIAGNOSIS — E119 Type 2 diabetes mellitus without complications: Secondary | ICD-10-CM

## 2019-07-01 ENCOUNTER — Other Ambulatory Visit: Payer: Self-pay | Admitting: Gastroenterology

## 2019-07-04 ENCOUNTER — Other Ambulatory Visit: Payer: Self-pay | Admitting: Primary Care

## 2019-07-06 ENCOUNTER — Other Ambulatory Visit
Admission: RE | Admit: 2019-07-06 | Discharge: 2019-07-06 | Disposition: A | Discharge status: Home / Self Care | Payer: Medicare (Managed Care) | Source: Ambulatory Visit | Attending: Primary Care | Admitting: Primary Care

## 2019-07-06 DIAGNOSIS — E119 Type 2 diabetes mellitus without complications: Secondary | ICD-10-CM

## 2019-07-06 DIAGNOSIS — R7989 Other specified abnormal findings of blood chemistry: Secondary | ICD-10-CM | POA: Insufficient documentation

## 2019-07-06 LAB — BASIC METABOLIC PANEL
Anion Gap: 11 (ref 7–16)
CO2: 23 mmol/L (ref 20–28)
Calcium: 9.2 mg/dL (ref 8.6–10.2)
Chloride: 107 mmol/L (ref 96–108)
Creatinine: 1.14 mg/dL (ref 0.67–1.17)
GFR,Black: 68 *
GFR,Caucasian: 59 * — AB
Glucose: 245 mg/dL — ABNORMAL HIGH (ref 60–99)
Lab: 20 mg/dL (ref 6–20)
Potassium: 5 mmol/L (ref 3.3–5.1)
Sodium: 141 mmol/L (ref 133–145)

## 2019-07-06 LAB — HEMOGLOBIN A1C: Hemoglobin A1C: 8.4 % — ABNORMAL HIGH

## 2019-07-06 LAB — TSH: TSH: 5.76 u[IU]/mL — ABNORMAL HIGH (ref 0.27–4.20)

## 2019-07-06 LAB — T4, FREE: Free T4: 1 ng/dL (ref 0.9–1.7)

## 2019-07-06 NOTE — Telephone Encounter (Signed)
LOV 02/26/19. NOV 07/10/19

## 2019-07-08 NOTE — Result Encounter Note (Signed)
Review results at next visit

## 2019-07-10 ENCOUNTER — Ambulatory Visit: Payer: Medicare (Managed Care) | Admitting: Primary Care

## 2019-07-10 ENCOUNTER — Encounter: Payer: Self-pay | Admitting: Family Medicine

## 2019-07-10 ENCOUNTER — Ambulatory Visit: Payer: Medicare (Managed Care) | Admitting: Family Medicine

## 2019-07-10 VITALS — BP 100/58 | HR 64 | Temp 97.1°F | Ht 70.87 in | Wt 195.7 lb

## 2019-07-10 DIAGNOSIS — E1169 Type 2 diabetes mellitus with other specified complication: Secondary | ICD-10-CM

## 2019-07-10 NOTE — Progress Notes (Signed)
Patient ID:  Jon [name]  1934/06/07    Subjective:    CC:  Follow-up (6 month follow up); Hypertension; and Diabetes    HPI    Feels he eats pretty well for the most part.     Experiences occasional pain to right knee. At times, he feels the knee click which is painful. Stays active by golfing and has difficulty getting in and out of the golf cart.     Jon [name] has a current medication list which includes the following prescription(s): metoprolol, apixaban, bd pen needle nano 2nd gen, mirabegron, finasteride, dofetilide, metformin, rosuvastatin, victoza, memantine, sacubitril-valsartan, freestyle lite, blood glucose monitor, lancets, ot ultra/fasttk cntrl soln, cinnamon, co-enzyme q-10, multiple vitamins-minerals, hydrocodone-acetaminophen, cholecalciferol, fish oil, amoxicillin, and acetaminophen.  Jon [name] is allergic to bee venom; no known latex allergy; and insects [other].      Medications and allergies were reviewed and reconciled with the patient in eRecord today; and No changes were made.      Review of Systems   Constitutional: Positive for weight loss.   Eyes: Negative for blurred vision.   Neurological: Positive for tingling.         Objective:    Vitals:    07/10/19 0942   BP: 100/58   Pulse: 64   Temp: 36.2 C (97.1 F)   SpO2: 97%   Weight: 88.8 kg (195 lb 11.2 oz)   Height: 1.8 m (5' 10.87")     Physical Exam   Constitutional: He is oriented to person, place, and time and well-developed, well-nourished, and in no distress.   HENT:   Head: Normocephalic.   Eyes: Pupils are equal, round, and reactive to light. Conjunctivae are normal.   Neck: Normal range of motion. Neck supple.   Cardiovascular: Normal rate and regular rhythm.   Pulmonary/Chest: Effort normal.   Musculoskeletal:         General: No edema.   Neurological: He is alert and oriented to person, place, and time.   Skin: Skin is warm and dry.   Psychiatric: Mood and affect normal.       No results found for this or any previous visit  (from the past 24 hour(s)).    Assessment/Plan:  [name] was seen today for follow-up, hypertension and diabetes.    Diagnoses and all orders for this visit:    Type 2 diabetes mellitus with other specified complication  -     CBC and differential; Future  -     Comprehensive metabolic panel; Future  -     Hemoglobin A1c; Future  -     Lipid Panel (Reflex to Direct  LDL if Triglycerides more than 400); Future  -     TSH; Future  -     Vitamin D; Future      After extended discussion with Jon [name] and his wife we have discovered multiple areas where he can do better by reducing starch in his diet.  They eat out quite often and patient is asked to leave his starches on his plate somewhat.  We will also avoid [employer] frappe coffees and donuts in the morning     Patient is active fairly regularly with yard work and golf.  He is encouraged to consider using rare NSAID and Tylenol prior to golf to avoid the knee discomfort and clicking that occurs getting in and out of the cart.    He is welcome to return for a knee injection as needed.    Labs are  posted patient will follow-up with blood work in 3 to 4 months    Return in about 4 months (around 11/10/2019) for Follow-up.    Romualdo Bolk FNP-C

## 2019-07-10 NOTE — Patient Instructions (Addendum)
1. Limit carbohydrates and processed sugars  2. Increase physical activity   3. Follow up in office in 3-4 months  4. Repeat blood work before the next appointment   5. May take tylenol and motrin (or advil) before being active

## 2019-07-29 LAB — UNMAPPED LAB RESULTS
Hematocrit (HT): 36.4 % — ABNORMAL LOW (ref 40.0–52.0)
Hemoglobin (HGB) (HT): 12.4 g/dL — ABNORMAL LOW (ref 13.0–17.5)
MCHC (HT): 34.1 g/dL — NL (ref 32.0–36.0)
MCV (HT): 91 FL — NL (ref 81.0–99.0)
Mean Corpuscular Hemoglobin (MCH) (HT): 31 pg — NL (ref 26.0–34.0)
Platelets (HT): 144 10 3/uL — NL (ref 140–400)
RBC (HT): 4 10 6/uL — ABNORMAL LOW (ref 4.20–5.90)
RDW (HT): 11.9 % — NL (ref 11.5–15.0)
WBC (HT): 6.3 10 3/uL — NL (ref 4.0–10.8)

## 2019-08-17 ENCOUNTER — Other Ambulatory Visit: Payer: Self-pay | Admitting: Primary Care

## 2019-08-17 NOTE — Telephone Encounter (Signed)
LOV 07/10/2019    Future 11/05/2019

## 2019-09-14 LAB — UNMAPPED LAB RESULTS
Hematocrit (HT): 36.9 % — ABNORMAL LOW (ref 40.0–52.0)
Hemoglobin (HGB) (HT): 12.4 g/dL — ABNORMAL LOW (ref 13.0–17.5)
MCHC (HT): 33.6 g/dL — NL (ref 32.0–36.0)
MCV (HT): 91.8 FL — NL (ref 81.0–99.0)
Mean Corpuscular Hemoglobin (MCH) (HT): 30.8 pg — NL (ref 26.0–34.0)
Platelets (HT): 149 10 3/uL — NL (ref 140–400)
RBC (HT): 4.02 10 6/uL — ABNORMAL LOW (ref 4.20–5.90)
RDW (HT): 12.3 % — NL (ref 11.5–15.0)
WBC (HT): 5.9 10 3/uL — NL (ref 4.0–10.8)

## 2019-09-17 ENCOUNTER — Other Ambulatory Visit: Payer: Self-pay | Admitting: Primary Care

## 2019-09-17 NOTE — Telephone Encounter (Signed)
LOV 07/10/19. NOV 11/05/19

## 2019-09-18 ENCOUNTER — Other Ambulatory Visit: Payer: Self-pay | Admitting: Internal Medicine

## 2019-09-18 ENCOUNTER — Encounter: Payer: Self-pay | Admitting: Primary Care

## 2019-09-18 NOTE — Telephone Encounter (Signed)
Error

## 2019-09-22 LAB — SURGICAL PATHOLOGY

## 2019-10-05 ENCOUNTER — Encounter: Payer: Self-pay | Admitting: Gastroenterology

## 2019-10-19 ENCOUNTER — Telehealth: Payer: Self-pay | Admitting: Endocrinology

## 2019-10-19 NOTE — Telephone Encounter (Signed)
Copied from [place] [id number]. Topic: Appointments - Schedule Appointment  >> Oct 19, 2019 11:27 AM Adina Puzzo-Parnell, Darrick Penna wrote:  Scheduled the patient with a follow up on 12/3 at 10:40  Erasmus can be reached at [phone]

## 2019-11-05 ENCOUNTER — Encounter: Payer: Self-pay | Admitting: Family Medicine

## 2019-11-05 ENCOUNTER — Ambulatory Visit: Payer: Medicare (Managed Care) | Admitting: Family Medicine

## 2019-11-05 ENCOUNTER — Other Ambulatory Visit: Payer: Self-pay | Admitting: Endocrinology

## 2019-11-05 VITALS — BP 100/64 | HR 77 | Temp 96.6°F | Ht 70.87 in | Wt 199.1 lb

## 2019-11-05 DIAGNOSIS — Z0000 Encounter for general adult medical examination without abnormal findings: Secondary | ICD-10-CM

## 2019-11-05 DIAGNOSIS — R4189 Other symptoms and signs involving cognitive functions and awareness: Secondary | ICD-10-CM

## 2019-11-05 NOTE — Patient Instructions (Signed)
Thank you for completing your Subsequent Annual Medicare Visit, Follow-up (4 month fuv), and Lab Results   with [place] today.     The purpose of this visits was to:     Screen for disease   Assess risk of future medical problems   Help develop a healthy lifestyle   Update vaccines   Get to know your doctor in case of an illness    Patient Care Team:  Karsten Fells, MD as PCP - General (Primary Care)     Medicare 5 Year Plan    The following items were identified as areas of concern during your screening today:  Diabetes - This is a risk factor for Heart Attack, Stroke, Kidney Problems, Eye Problems and a loss of feeling in your fingers and feet.   BMI greater than 25 - This is a risk for Heart Attack, Stroke, High Blood Pressure, Diabetes, High Cholesterol and other complications.       The Health Maintenance table below identifies screening tests and immunizations recommended by your health care team:  Health Maintenance   Topic Date Due    HIV Screening  03/09/1947    Shingles Vaccine (1 of 2) 03/09/1986    Diabetic Foot Exam  05/30/2017    Diabetic Eye Exam  09/10/2018    Diabetic A1C Monitoring  10/06/2019    Diabetic Kidney Disease  07/06/2020 (Originally 09/05/2017)    DEPRESSION SCREEN YEARLY  01/10/2020    Fall Risk Screening  11/04/2020    Flu Shot  Completed    Adult Prevnar Vaccination  Completed    Adult Pneumovax Vaccine  Completed     In addition, goals and orders placed to address these recommendations are listed in the "Today's Visit" section.    We wish you the best of health and look forward to seeing you again next year for your Annual Medicare Wellness Visit.     If you have any health care concerns before then, please do not hesitate to contact us.

## 2019-11-05 NOTE — Progress Notes (Addendum)
Visit performed as: Initial Medicare wellness exam        Office Visit, met with patient in person    Today we reviewed and updated Jon [name] smoking status, activities of daily living, depression screen, fall risk, medications and allergies.   I have counseled the patient in the above areas.     Subjective:     Chief Complaint: Jon [name] is a 85 y.o. male here for a/an Subsequent Annual Medicare Visit, Follow-up (4 month fuv), and Lab Results    In general, Jon [name] rates their overall health as:  good      Patient Care Team:  Karsten Fells, MD as PCP - General (Primary Care)     Current Outpatient Medications on File Prior to Visit   Medication Sig Dispense Refill    Sacubitril-Valsartan (ENTRESTO) 24-26 MG per tablet Take 1 tablet by mouth 2 times daily      Coenzyme Q10 200 MG capsule Take 200 mg by mouth daily      Omega-3 Fatty Acids (FISH OIL) 1000 MG CAPS capsule Take 1 g by mouth daily      memantine (NAMENDA) 10 MG tablet TAKE 1 TABLET BY MOUTH TWO TIMES DAILY 180 tablet 3    apixaban (ELIQUIS) 5 MG tablet Take 1 tablet (5 mg total) by mouth 2 times daily 180 tablet 1    metoprolol (TOPROL-XL) 25 MG 24 hr tablet Take 25 mg by mouth every 12 hours      BD PEN NEEDLE NANO 2ND GEN 32G X 4 MM USE ONE TIME DAILY AS DIRECTED WITH VICTOZA PENS 100 each 3    mirabegron (MYRBETRIQ) 50 MG 24 hr tablet Take 1 tablet (50 mg total) by mouth daily 90 tablet 2    finasteride (PROSCAR) 5 MG tablet Take 1 tablet (5 mg total) by mouth daily For use by men only. 90 tablet 3    dofetilide (TIKOSYN) 250 MCG capsule Take 250 mcg by mouth 2 times daily      metFORMIN (GLUCOPHAGE) 1000 MG tablet TAKE 1 TABLET BY MOUTH TWO TIMES DAILY WITH MEALS 180 tablet 3    rosuvastatin (CRESTOR) 10 MG tablet TAKE 1 TABLET BY MOUTH EVERY DAY WITH DINNER 90 tablet 3    VICTOZA 18 MG/3ML injection INJECT 1.8MG UNDER THE SKIN EVERY DAY 27 mL 3    amoxicillin (AMOXIL) 500 MG capsule TAKE 4 CAPSULES BY  MOUTH ONE HOUR BEFORE DENTAL APPOINTMENTS  0    Sacubitril-Valsartan (ENTRESTO) 24-26 MG per tablet Take 1 tablet by mouth daily      FREESTYLE LITE test strip TO BE USED TWO TIMES DAILY TO TEST BLOOD SUGAR 100 strip 4    blood glucose monitor (FREESTYLE LITE) kit Use as instructed 1 each 0    lancets (ONETOUCH ULTRASOFT) Use   2  times per day as instructed for blood glucose testing. 100 each 3    Blood Glucose Calibration (OT ULTRA/FASTTK CNTRL SOLN) SOLN As directed 1 each 5    CINNAMON PO Take by mouth      Co-Enzyme Q-10 100 MG CAPS Take by mouth      Multiple Vitamins-Minerals (MULTI VITAMIN/MINERALS PO) Take by mouth      HYDROcodone-acetaminophen (VICODIN) 5-500 MG per tablet 1-2 po q 6 hours prn pain MDD=6 180 tablet 1    cholecalciferol (VITAMIN D) 1000 UNIT tablet Take 2,000 Units by mouth daily          FISH OIL by  Does not apply route daily.      acetaminophen (TYLENOL) 500 MG tablet Take 500 mg by mouth 2 times daily.       No current facility-administered medications on file prior to visit.      Allergies   Allergen Reactions    Bee Venom Other (See Comments)     Red and swelling    No Known Latex Allergy      Created by Conversion - 0;     Insects [Other] Other (See Comments)     Red and swelling     Patient Active Problem List    Diagnosis Date Noted    CHF (congestive heart failure) 11/20/2016     Priority: High    Paroxysmal atrial fibrillation 11/20/2016     Priority: High    Cardiomyopathy 11/12/2016     Priority: High    Protein S deficiency 11/12/2016     Priority: High    Type 2 diabetes mellitus with other specified complication 06/11/9484     Priority: High     Created by Conversion        Chronic anticoagulation 11/12/2016     Priority: Medium    Vascular dementia 11/12/2016     Priority: Medium    BPH (benign prostatic hyperplasia) 08/07/2011     Priority: Medium    Paget's Disease 09/06/2004     Priority: Medium     Created by Conversion        Lower urinary  tract symptoms (LUTS) 09/08/2014     Priority: Low    Opiate analgesic contract exists 09/09/2013     Priority: Low    Penile lesion 08/12/2012     Priority: Low    Urgency of urination 08/07/2011     Priority: Low    Obesity 09/06/2004     Priority: Low     Created by Conversion        Ascending aorta dilatation 04/08/2018     3.9cm on 11/25/17; needs yearly surveillance      Chronic low back pain 09/23/2017     Past Medical History:   Diagnosis Date    Arthritis     BPH (benign prostatic hypertrophy)     CHF (congestive heart failure)     Clotting disorder     Dementia     mild    Diabetes mellitus     Paget's disease of bone     Palpitations     Protein S deficiency     On chronic anticoagulation    Sinus bradycardia     Varicella      Past Surgical History:   Procedure Laterality Date    APPENDECTOMY      CHOLECYSTECTOMY      EYE SURGERY      GALLBLADDER SURGERY      JOINT REPLACEMENT      KNEE REPLACEMENT      SKIN GRAFT      TONSILLECTOMY       Family History   Problem Relation Age of Onset    BRCA 1/2 Brother     Arthritis Mother     Heart Disease Mother     High Blood Pressure Mother     Heart Disease Father     Stroke Father     Depression Other     Diabetes Other     Heart failure Other     Heart Disease Other     High Blood Pressure Other  Kidney Disease Other      Social History     Socioeconomic History    Marital status: Married     Spouse name: Not on file    Number of children: Not on file    Years of education: Not on file    Highest education level: Not on file   Occupational History    Occupation: Former Personal assistant, used to work night shift   Tobacco Use    Smoking status: Never Smoker    Smokeless tobacco: Never Used   Substance and Sexual Activity    Alcohol use: No    Drug use: No    Sexual activity: Never   Social History Narrative    2 daughters with neurological issues       Objective:     Vital Signs: BP 100/64    Pulse 77    Temp 35.9 C (96.6  F)    Ht 1.8 m (5' 10.87")    Wt 90.3 kg (199 lb 1.6 oz)    SpO2 98%    BMI 27.87 kg/m    BMI: Body mass index is 27.87 kg/m.    Vision Screening Results (Welcome visit only):  No exam data present    Depression Screening Results:  Recent Review Flowsheet Data     PHQ Scores 01/09/2019 12/23/2017 11/07/2016    PSQ2 Q1 - Interest/Pleasure N N N    PSQ2 Q2 - Down, Depressed, Hopeless N N N        Opioid Use/DAST- 10 Screening Results:   How many times in the past year have you used an illegal drug or used a prescription medication for nonmedical reasons?: 0 (11/05/2019 10:01 AM)    Activities of Daily Living/Functional Screening Results:  Is the person deaf or does he/she have serious difficulty hearing?: N  Is this person blind or does he/she have serious difficulty seeing even when wearing glasses?: N  *Vision Status: Visual aid  (glasses used for distance)  Does this person have serious difficulty walking or climbing stairs?: N  Does this person have difficulty dressing or bathing?: N  *Shopping: Independent  *House Keeping: Independent  *Managing Own Medications: Independent  *Handling Finances: Independent  If you need help, who helps you?: Spouse  Difficulty doing errands due to a physicial, mental or emotional condition: No  Difficulty remembering or making decisions due to a physicial, mental or emotional condition: No      Fall Risk Screening Results:  Have you fallen in the last year?: No  Do you feel you are at risk for falling?: No      Assessment and Plan:     Cognitive Function:  Recall of recent and remote events appears:  Pt. Was good with aspects of MME but became frustrated and refused to finish..    declining      Advanced Care Planning:  was discussed and patient received paperwork to review     The following health maintenance plan was reviewed with the patient:    Health Maintenance Topics with due status: Overdue       Topic Date Due    HIV Screening USPSTF/[place] 03/09/1947    IMM-ZOSTER 03/09/1986     Diabetic Foot Exam ADA 05/30/2017    Diabetic Eye Exam ADA 09/10/2018    Diabetic A1C Monitoring Other 10/06/2019     Health Maintenance Topics with due status: Postponed       Topic Postponed Until    Diabetic Nephropathy Screening on ACE/ARB  07/06/2020 (Originally 09/05/2017)     Health Maintenance Topics with due status: Not Due       Topic Last Completion Date    DEPRESSION SCREEN YEARLY 01/09/2019    Fall Risk Screening 11/05/2019     Health Maintenance Topics with due status: Completed       Topic Last Completion Date    IMM-PNEUMOVAX VACCINE 65 + YRS 08/12/2009    IMM-PREVNAR VACCINE 65 + YRS 05/18/2016    IMM-INFLUENZA 10/05/2019     This health maintenance schedule, identified risks, a list of orders placed today and patient goals have been provided to Althea Grimmer in the after visit summary.     Plan for any concerns identified during screening or risk assessments:  N/a    Plan to get lab work done and follow-up for physical in December.

## 2019-11-09 ENCOUNTER — Other Ambulatory Visit
Admission: RE | Admit: 2019-11-09 | Discharge: 2019-11-09 | Disposition: A | Discharge status: Home / Self Care | Payer: Medicare (Managed Care) | Source: Ambulatory Visit | Attending: Family Medicine | Admitting: Family Medicine

## 2019-11-09 DIAGNOSIS — E1169 Type 2 diabetes mellitus with other specified complication: Secondary | ICD-10-CM | POA: Insufficient documentation

## 2019-11-09 LAB — LIPID PANEL
Chol/HDL Ratio: 2.8
Cholesterol: 126 mg/dL
HDL: 45 mg/dL (ref 40–60)
LDL Calculated: 60 mg/dL
Non HDL Cholesterol: 81 mg/dL
Triglycerides: 105 mg/dL

## 2019-11-09 LAB — CBC AND DIFFERENTIAL
Baso # K/uL: 0 10*3/uL (ref 0.0–0.1)
Basophil %: 0.6 %
Eos # K/uL: 0.3 10*3/uL (ref 0.0–0.5)
Eosinophil %: 4 %
Hematocrit: 40 % (ref 40–51)
Hemoglobin: 12.4 g/dL — ABNORMAL LOW (ref 13.7–17.5)
IMM Granulocytes #: 0 10*3/uL (ref 0.0–0.0)
IMM Granulocytes: 0.5 %
Lymph # K/uL: 1.7 10*3/uL (ref 1.3–3.6)
Lymphocyte %: 26.8 %
MCH: 30 pg/cell (ref 26–32)
MCHC: 31 g/dL — ABNORMAL LOW (ref 32–37)
MCV: 96 fL — ABNORMAL HIGH (ref 79–92)
Mono # K/uL: 0.5 10*3/uL (ref 0.3–0.8)
Monocyte %: 7.3 %
Neut # K/uL: 3.8 10*3/uL (ref 1.8–5.4)
Nucl RBC # K/uL: 0 10*3/uL (ref 0.0–0.0)
Nucl RBC %: 0 /100 WBC (ref 0.0–0.2)
Platelets: 164 10*3/uL (ref 150–330)
RBC: 4.1 MIL/uL — ABNORMAL LOW (ref 4.6–6.1)
RDW: 12.7 % (ref 11.6–14.4)
Seg Neut %: 60.8 %
WBC: 6.2 10*3/uL (ref 4.2–9.1)

## 2019-11-09 LAB — COMPREHENSIVE METABOLIC PANEL
ALT: 20 U/L (ref 0–50)
AST: 18 U/L (ref 0–50)
Albumin: 4 g/dL (ref 3.5–5.2)
Alk Phos: 79 U/L (ref 40–130)
Anion Gap: 7 (ref 7–16)
Bilirubin,Total: 0.5 mg/dL (ref 0.0–1.2)
CO2: 26 mmol/L (ref 20–28)
Calcium: 9.5 mg/dL (ref 8.6–10.2)
Chloride: 104 mmol/L (ref 96–108)
Creatinine: 1.27 mg/dL — ABNORMAL HIGH (ref 0.67–1.17)
GFR,Black: 60 *
GFR,Caucasian: 52 * — AB
Glucose: 203 mg/dL — ABNORMAL HIGH (ref 60–99)
Lab: 22 mg/dL — ABNORMAL HIGH (ref 6–20)
Potassium: 5 mmol/L (ref 3.3–5.1)
Sodium: 137 mmol/L (ref 133–145)
Total Protein: 6.5 g/dL (ref 6.3–7.7)

## 2019-11-09 LAB — TSH: TSH: 5.34 u[IU]/mL — ABNORMAL HIGH (ref 0.27–4.20)

## 2019-11-09 LAB — HEMOGLOBIN A1C: Hemoglobin A1C: 9.2 % — ABNORMAL HIGH

## 2019-11-09 LAB — VITAMIN D: 25-OH Vit Total: 42 ng/mL (ref 30–60)

## 2019-11-10 DIAGNOSIS — R4189 Other symptoms and signs involving cognitive functions and awareness: Secondary | ICD-10-CM | POA: Insufficient documentation

## 2019-11-19 ENCOUNTER — Encounter: Payer: Self-pay | Admitting: Endocrinology

## 2019-11-19 ENCOUNTER — Ambulatory Visit: Payer: Medicare (Managed Care) | Admitting: Endocrinology

## 2019-11-19 VITALS — BP 112/60 | HR 72 | Ht 71.0 in | Wt 198.9 lb

## 2019-11-19 DIAGNOSIS — E1165 Type 2 diabetes mellitus with hyperglycemia: Secondary | ICD-10-CM

## 2019-11-19 DIAGNOSIS — IMO0002 Reserved for concepts with insufficient information to code with codable children: Secondary | ICD-10-CM

## 2019-11-19 MED ORDER — LANCETS MICRO THIN 33G MISC
5 refills | Status: AC
Start: 2019-11-19 — End: ?

## 2019-11-19 MED ORDER — ONETOUCH VERIO W/DEVICE KIT *A*
PACK | 0 refills | Status: AC
Start: 2019-11-19 — End: ?

## 2019-11-19 MED ORDER — PIOGLITAZONE HCL 15 MG PO TABS *I*
15.0000 mg | ORAL_TABLET | Freq: Every day | ORAL | 6 refills | Status: DC
Start: 2019-11-19 — End: 2020-06-01

## 2019-11-19 MED ORDER — ONETOUCH VERIO VI STRP *A*
ORAL_STRIP | 6 refills | Status: DC
Start: 2019-11-19 — End: 2021-01-12

## 2019-11-19 NOTE — NoShare Progress Note (Signed)
Subjective:      Jon [name] is a 85 y.o. male who presents for follow up of type 2 diabetes.   Last seen on October 2018      INTERVAL HISTORY:  He and his wife tell me that they returned today because his A1c has risen.  --Treatment history:  --Current management:  Current diabetic medications include:  Metformin 1 gm bid and Victoza 1.8 mg daily.   -- Self-glucose monitoring:  Does not check  -- Hyperglycemia: unknown  -- Hypoglycemia: denies confusion, dizziness, headache, seizures, sweating, nightmares and LOC.  Hypoglycemia unawareness: no  -- Endocrine: denies polyuria, polydipsia.  -- CV: denies chest pain, shortness of breath.  --Extremities:     -- Hands: denies tingling,numbness, pain     -- Feet: denies tingling, numbness, pain, foot ulcers  -- Eyes: denies history of laser treatment, double/blurry vision, retinopathy, cataracts, glaucoma, Last eye exam:  yearly.  -- Kidney: denies history of nephropathy.  -- Diet:  Avoids sweets and sweetened drinks  -- Exercise: plays golf in the summer but no exercise in the winter      Patient's medications, allergies, past medical, surgical, social and family histories were reviewed and updated as appropriate.      Review of Systems  CONSTITUTIONAL:  Appetite good, no fevers, night sweats or weight loss  HEAD: No headache, dizziness, or syncope  EYES:No vision changes or eye pain  CV: No chest pain, shortness of breath, or edema.  RESPIRATORY:  No cough, wheezing, or dyspnea  GI:  No nausea, vomiting, abdominal pain, or change in bowel habits  NEURO:  No mental status changes, motor weakness, or sensory changes  MS:  No joint pain, swelling, or musculoskeletal deformities  ENDOCRINE:  No polyuria, polydipsia, or heat intolerance.       Objective:     BP 112/60    Pulse 72    Ht 1.803 m (5\' 11" )    Wt 90.2 kg (198 lb 14.4 oz)    BMI 27.74 kg/m   GENERAL APPEARANCE: well developed, well nourished, no acute distress; aware and alert  HEENT: PERLA, EOMI, no lid  lag, pink conjunctiva ; no proptosisNECK: supple, no thyromegaly, no lymphadenopathy  HEART: RRR, normal S1, S2  CHEST: lungs clear to auscultation bilaterally  EXTREMITIES: no clubbing, cyanosis, or edema. Pedal pulses 2+ bilaterally. Both feet clean without deformity, callous, ulceration, or fungal infection.  NEUROLOGICAL: Alert and oriented x 3. Decreased sensitivity to monofilament in toes, R > L. Normal DTRs      Lab Review  Lab Results   Component Value Date/Time    NA 137 11/09/2019 09:19 AM    K 5.0 11/09/2019 09:19 AM    CL 104 11/09/2019 09:19 AM    UN 22 (H) 11/09/2019 09:19 AM    CREAT 1.27 (H) 11/09/2019 09:19 AM    MALBR 0.81 09/05/2016 08:30 AM     Lab Results   Component Value Date/Time    CHOL 126 11/09/2019 09:19 AM    TRIG 105 11/09/2019 09:19 AM    HDL 45 11/09/2019 09:19 AM    LDLC 60 11/09/2019 09:19 AM       Lab Results   Component Value Date/Time    TSH 5.34 (H) 11/09/2019 09:19 AM     Results for Jon, [name] (MRN [id number]) as of 11/19/2019 11:00   Ref. Range 09/19/2017 10:47 12/23/2018 08:30 07/06/2019 09:05 11/09/2019 09:19   Hemoglobin A1C,POC Latest Ref Range: 4.0 - 6.0 %  7.9 (H)      Hemoglobin A1C Latest Units: %  9.0 (H) 8.4 (H) 9.2 (H)           Assessment:          Jon [name] is a 85 y.o. male with Uncontrolled type 2 diabetes mellitus..  A1c higher and at this time, I do not feel that diet changes will be enough to get his A1c down to 7-8%.  He does not want to take a daily insulin so will add Actos to see if this will help.          Plan:     TESTS:  A1c at next visit      TREATMENT:  Continue metformin and victoza.  Will add actos 15 mg daily, consider changing to weekly GLP-1 once supply of victoza runs out.      RETURN IN:  Three months    Thank you for allowing me to participate in the care of this patient.  Please feel free to contact me if you have any questions or concerns.

## 2019-11-24 ENCOUNTER — Other Ambulatory Visit: Payer: Self-pay | Admitting: Primary Care

## 2019-11-24 DIAGNOSIS — E7800 Pure hypercholesterolemia, unspecified: Secondary | ICD-10-CM

## 2019-11-24 NOTE — Telephone Encounter (Signed)
LOV 11/04/2019    Future 12/04/2019

## 2019-12-04 ENCOUNTER — Encounter: Payer: Medicare (Managed Care) | Admitting: Primary Care

## 2019-12-04 ENCOUNTER — Encounter: Payer: Self-pay | Admitting: Primary Care

## 2019-12-04 ENCOUNTER — Ambulatory Visit: Payer: Medicare (Managed Care) | Admitting: Primary Care

## 2019-12-04 VITALS — BP 96/62 | HR 69 | Temp 97.5°F | Ht 70.98 in | Wt 200.1 lb

## 2019-12-04 DIAGNOSIS — Z0000 Encounter for general adult medical examination without abnormal findings: Secondary | ICD-10-CM

## 2019-12-04 DIAGNOSIS — E1169 Type 2 diabetes mellitus with other specified complication: Secondary | ICD-10-CM

## 2019-12-04 DIAGNOSIS — I480 Paroxysmal atrial fibrillation: Secondary | ICD-10-CM

## 2019-12-04 NOTE — Progress Notes (Signed)
Medicare Wellness Visit Note    Visit performed as:             Office Visit, met with patient in person    Today we reviewed and updated Jon [name] smoking status, activities of daily living, depression screen, fall risk, medications and allergies.   I have counseled the patient in the above areas.     Subjective:     Chief Complaint: Jon [name] is a 85 y.o. male here for a/an Subsequent Annual Medicare Visit and Annual Exam    In general, Jon [name] rates their overall health as:  fair      Patient Care Team:  Karsten Fells, MD as PCP - General (Primary Care)     Current Outpatient Medications on File Prior to Visit   Medication Sig Dispense Refill    blood glucose monitor (FREESTYLE LITE) kit TEST AS DIRECTED      rosuvastatin (CRESTOR) 10 MG tablet TAKE 1 TABLET BY MOUTH EVERY DAY WITH DINNER 90 tablet 3    pioglitazone (ACTOS) 15 MG tablet Take 1 tablet (15 mg total) by mouth daily 30 tablet 6    blood glucose (ONETOUCH VERIO) test strip Test  1 times a day.   Brand name of strips one touch verio 50 strip 6    Lancets Micro Thin 33G MISC Use as directed once daily. Dispense lancets for one touch verio 100 each 5    blood glucose monitor w/Device KIT Test as directed 1 kit 0    Sacubitril-Valsartan (ENTRESTO) 24-26 MG per tablet Take 1 tablet by mouth 2 times daily      Coenzyme Q10 200 MG capsule Take 200 mg by mouth daily      Omega-3 Fatty Acids (FISH OIL) 1000 MG CAPS capsule Take 1 g by mouth daily      VICTOZA 18 MG/3ML injection INJECT 1.8 MG SUBCUTANEOUSLY (UNDER THE SKIN) EVERY DAY 27 mL 0    memantine (NAMENDA) 10 MG tablet TAKE 1 TABLET BY MOUTH TWO TIMES DAILY 180 tablet 3    apixaban (ELIQUIS) 5 MG tablet Take 1 tablet (5 mg total) by mouth 2 times daily 180 tablet 1    metoprolol (TOPROL-XL) 25 MG 24 hr tablet Take 25 mg by mouth every 12 hours      BD PEN NEEDLE NANO 2ND GEN 32G X 4 MM USE ONE TIME DAILY AS DIRECTED WITH VICTOZA PENS 100 each 3    mirabegron  (MYRBETRIQ) 50 MG 24 hr tablet Take 1 tablet (50 mg total) by mouth daily 90 tablet 2    finasteride (PROSCAR) 5 MG tablet Take 1 tablet (5 mg total) by mouth daily For use by men only. 90 tablet 3    dofetilide (TIKOSYN) 250 MCG capsule Take 250 mcg by mouth 2 times daily      metFORMIN (GLUCOPHAGE) 1000 MG tablet TAKE 1 TABLET BY MOUTH TWO TIMES DAILY WITH MEALS 180 tablet 3    amoxicillin (AMOXIL) 500 MG capsule TAKE 4 CAPSULES BY MOUTH ONE HOUR BEFORE DENTAL APPOINTMENTS  0    lancets (ONETOUCH ULTRASOFT) Use   2  times per day as instructed for blood glucose testing. 100 each 3    Blood Glucose Calibration (OT ULTRA/FASTTK CNTRL SOLN) SOLN As directed 1 each 5    CINNAMON PO Take by mouth      Co-Enzyme Q-10 100 MG CAPS Take by mouth      Multiple Vitamins-Minerals (MULTI VITAMIN/MINERALS PO) Take  by mouth      cholecalciferol (VITAMIN D) 1000 UNIT tablet Take 2,000 Units by mouth daily          FISH OIL by Does not apply route daily.      acetaminophen (TYLENOL) 500 MG tablet Take 500 mg by mouth 2 times daily.       No current facility-administered medications on file prior to visit.      Allergies   Allergen Reactions    Bee Venom Other (See Comments)     Red and swelling    No Known Latex Allergy      Created by Conversion - 0;     Insects [Other] Other (See Comments)     Red and swelling     Patient Active Problem List    Diagnosis Date Noted    CHF (congestive heart failure) 11/20/2016     Priority: High    Paroxysmal atrial fibrillation 11/20/2016     Priority: High    Cardiomyopathy 11/12/2016     Priority: High    Protein S deficiency 11/12/2016     Priority: High    Type 2 diabetes mellitus with other specified complication 03/47/4259     Priority: High     Created by Conversion        Chronic anticoagulation 11/12/2016     Priority: Medium    Vascular dementia 11/12/2016     Priority: Medium    BPH (benign prostatic hyperplasia) 08/07/2011     Priority: Medium    Paget's  Disease 09/06/2004     Priority: Medium     Created by Conversion        Lower urinary tract symptoms (LUTS) 09/08/2014     Priority: Low    Opiate analgesic contract exists 09/09/2013     Priority: Low    Penile lesion 08/12/2012     Priority: Low    Urgency of urination 08/07/2011     Priority: Low    Obesity 09/06/2004     Priority: Low     Created by Conversion        Cognitive decline 11/10/2019    Ascending aorta dilatation 04/08/2018     3.9cm on 11/25/17; needs yearly surveillance      Chronic low back pain 09/23/2017     Past Medical History:   Diagnosis Date    Arthritis     BPH (benign prostatic hypertrophy)     CHF (congestive heart failure)     Clotting disorder     Dementia     mild    Diabetes mellitus     Paget's disease of bone     Palpitations     Protein S deficiency     On chronic anticoagulation    Sinus bradycardia     Varicella      Past Surgical History:   Procedure Laterality Date    APPENDECTOMY      CHOLECYSTECTOMY      EYE SURGERY      GALLBLADDER SURGERY      JOINT REPLACEMENT      KNEE REPLACEMENT      SKIN GRAFT      TONSILLECTOMY       Family History   Problem Relation Age of Onset    BRCA 1/2 Brother     Arthritis Mother     Heart Disease Mother     High Blood Pressure Mother     Heart Disease Father     Stroke Father  Depression Other     Diabetes Other     Heart failure Other     Heart Disease Other     High Blood Pressure Other     Kidney Disease Other      Social History     Socioeconomic History    Marital status: Married     Spouse name: Not on file    Number of children: Not on file    Years of education: Not on file    Highest education level: Not on file   Occupational History    Occupation: Former Personal assistant, used to work night shift   Tobacco Use    Smoking status: Never Smoker    Smokeless tobacco: Never Used   Substance and Sexual Activity    Alcohol use: No    Drug use: No    Sexual activity: Never   Social History  Narrative    2 daughters with neurological issues       Objective:     Vital Signs: BP 96/62    Pulse 69    Temp 36.4 C (97.5 F)    Ht 1.803 m (5' 10.98")    Wt 90.8 kg (200 lb 1.6 oz)    SpO2 95%    BMI 27.92 kg/m    BMI: Body mass index is 27.92 kg/m.    Vision Screening Results (Welcome visit only):  No exam data present    Depression Screening Results:  Recent Review Flowsheet Data     PHQ Scores 01/09/2019 12/23/2017 11/07/2016    PSQ2 Q1 - Interest/Pleasure N N N    PSQ2 Q2 - Down, Depressed, Hopeless N N N        Opioid Use/DAST- 10 Screening Results:   How many times in the past year have you used an illegal drug or used a prescription medication for nonmedical reasons?: 0 (12/04/2019  4:00 PM)    Activities of Daily Living/Functional Screening Results:  Is the person deaf or does he/she have serious difficulty hearing?: N  Is this person blind or does he/she have serious difficulty seeing even when wearing glasses?: N  *Vision Status: Visual aid  (has eye implants but wears distance glasses)  Does this person have serious difficulty walking or climbing stairs?: N  Does this person have difficulty dressing or bathing?: N  *Shopping: Independent  *House Keeping: Independent  *Managing Own Medications: Needs Assistance (spouse)  *Handling Finances: Independent  If you need help, who helps you?: spouse  Difficulty doing errands due to a physicial, mental or emotional condition: No  Difficulty remembering or making decisions due to a physicial, mental or emotional condition: No      Fall Risk Screening Results:  Have you fallen in the last year?: No  Do you feel you are at risk for falling?: Yes (both knees)  Do you feel your risk of falling is: : Low (pt aware to be cautioius)  Would you like to learn more about how to reduce your risk of falling?: Yes      Assessment and Plan:     Cognitive Function:  Recall of recent and remote events appears:  Normal      Advanced Care Planning:  was discussed and the  paperwork can be found in the scanned media section     The following health maintenance plan was reviewed with the patient:    Health Maintenance Topics with due status: Overdue       Topic Date Due  IMM-ZOSTER 03/09/1986     Health Maintenance Topics with due status: Postponed       Topic Postponed Until    Diabetic Nephropathy Screening on ACE/ARB 07/06/2020 (Originally 09/05/2017)    HIV Screening USPSTF/ 10/22/2029 (Originally 03/09/1947)     Health Maintenance Topics with due status: Not Due       Topic Last Completion Date    DEPRESSION SCREEN YEARLY 01/09/2019    Diabetic Eye Exam ADA 05/26/2019    Diabetic Foot Exam ADA 06/23/2019    Diabetic A1C Monitoring Other 11/09/2019    Fall Risk Screening 12/04/2019     Health Maintenance Topics with due status: Completed       Topic Last Completion Date    IMM-PNEUMOVAX VACCINE 65 + YRS 08/12/2009    IMM-PREVNAR VACCINE 65 + YRS 05/18/2016    IMM-INFLUENZA 10/05/2019     This health maintenance schedule, identified risks, a list of orders placed today and patient goals have been provided to Jon [name] in the after visit summary.     Plan for any concerns identified during screening or risk assessments:  shingrix due.     Georgiann Mccoy, MD, MPH  [facility]

## 2019-12-04 NOTE — Patient Instructions (Signed)
Thank you for completing your Subsequent Annual Medicare Visit and Annual Exam   with [place] today.     The purpose of this visits was to:     Screen for disease   Assess risk of future medical problems   Help develop a healthy lifestyle   Update vaccines   Get to know your doctor in case of an illness    Patient Care Team:  Karsten Fells, MD as PCP - General (Primary Care)     Medicare 5 Year Plan    The following items were identified as areas of concern during your screening today:  Diabetes - This is a risk factor for Heart Attack, Stroke, Kidney Problems, Eye Problems and a loss of feeling in your fingers and feet.   BMI greater than 25 - This is a risk for Heart Attack, Stroke, High Blood Pressure, Diabetes, High Cholesterol and other complications.       The Health Maintenance table below identifies screening tests and immunizations recommended by your health care team:  Health Maintenance: These screening recommendations are based on USPSTF, [employer], and [place] state guidelines   Topic Date Due    Shingles Vaccine (1 of 2) 03/09/1986    Diabetic Kidney Disease  07/06/2020 (Originally 09/05/2017)    HIV Screening  10/22/2029 (Originally 03/09/1947)    DEPRESSION SCREEN YEARLY  01/10/2020    Diabetic A1C Monitoring  02/09/2020    Diabetic Foot Exam  06/22/2020    Fall Risk Screening  12/03/2020    Diabetic Eye Exam  05/25/2021    Flu Shot  Completed    Adult Prevnar Vaccination  Completed    Adult Pneumovax Vaccine  Completed     In addition, goals and orders placed to address these recommendations are listed in the "Today's Visit" section.    We wish you the best of health and look forward to seeing you again next year for your Annual Medicare Wellness Visit.     If you have any health care concerns before then, please do not hesitate to contact us.

## 2019-12-16 NOTE — Progress Notes (Signed)
[facility]  Physical Exam Visit    Jon [name] is 85 y.o. male presenting for a full physical exam.     Specific Concern's today also include:     TM2  adhrrent to medications  Denies polydipsia, polyuria, dizziness, numbness or tingling in hands or feet.      afib  Takes apixaban  No bleeding concerns      ~~~    Patient has not been hospitalized in the past year.    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    Type 2 diabetes mellitus with other specified complication P32.95    Obesity E66.9    Urgency of urination R39.15    BPH (benign prostatic hyperplasia) N40.0    Penile lesion N48.9    Opiate analgesic contract exists Z79.891    Lower urinary tract symptoms (LUTS) R39.9    CHF (congestive heart failure) I50.9    Paroxysmal atrial fibrillation I48.0    Cardiomyopathy I42.9    Chronic anticoagulation Z79.01    Protein S deficiency D68.59    Vascular dementia F01.50    Chronic low back pain M54.5, G89.29    Ascending aorta dilatation I77.810    Cognitive decline R41.89       Current Outpatient Medications   Medication Sig    blood glucose monitor (FREESTYLE LITE) kit TEST AS DIRECTED    rosuvastatin (CRESTOR) 10 MG tablet TAKE 1 TABLET BY MOUTH EVERY DAY WITH DINNER    pioglitazone (ACTOS) 15 MG tablet Take 1 tablet (15 mg total) by mouth daily    blood glucose (ONETOUCH VERIO) test strip Test  1 times a day.   Brand name of strips one touch verio    Lancets Micro Thin 33G MISC Use as directed once daily. Dispense lancets for one touch verio    blood glucose monitor w/Device KIT Test as directed    Sacubitril-Valsartan (ENTRESTO) 24-26 MG per tablet Take  1 tablet by mouth 2 times daily    Coenzyme Q10 200 MG capsule Take 200 mg by mouth daily    Omega-3 Fatty Acids (FISH OIL) 1000 MG CAPS capsule Take 1 g by mouth daily    VICTOZA 18 MG/3ML injection INJECT 1.8 MG SUBCUTANEOUSLY (UNDER THE SKIN) EVERY DAY    memantine (NAMENDA) 10 MG tablet TAKE 1 TABLET BY MOUTH TWO TIMES DAILY    apixaban (ELIQUIS) 5 MG tablet Take 1 tablet (5 mg total) by mouth 2 times daily    metoprolol (TOPROL-XL) 25 MG 24 hr tablet Take 25 mg by mouth every 12 hours    BD PEN NEEDLE NANO 2ND GEN 32G X 4 MM USE ONE TIME DAILY AS DIRECTED WITH VICTOZA PENS    mirabegron (MYRBETRIQ) 50 MG 24 hr tablet Take 1 tablet (50 mg total) by mouth daily    finasteride (PROSCAR) 5 MG tablet Take 1 tablet (5 mg total) by mouth daily For use by men only.    dofetilide (TIKOSYN) 250 MCG capsule Take 250 mcg by mouth 2 times daily    metFORMIN (GLUCOPHAGE) 1000 MG tablet  TAKE 1 TABLET BY MOUTH TWO TIMES DAILY WITH MEALS    amoxicillin (AMOXIL) 500 MG capsule TAKE 4 CAPSULES BY MOUTH ONE HOUR BEFORE DENTAL APPOINTMENTS    lancets (ONETOUCH ULTRASOFT) Use   2  times per day as instructed for blood glucose testing.    Blood Glucose Calibration (OT ULTRA/FASTTK CNTRL SOLN) SOLN As directed    CINNAMON PO Take by mouth    Co-Enzyme Q-10 100 MG CAPS Take by mouth    Multiple Vitamins-Minerals (MULTI VITAMIN/MINERALS PO) Take by mouth    cholecalciferol (VITAMIN D) 1000 UNIT tablet Take 2,000 Units by mouth daily        FISH OIL by Does not apply route daily.    acetaminophen (TYLENOL) 500 MG tablet Take 500 mg by mouth 2 times daily.       Allergies   Allergen Reactions    Bee Venom Other (See Comments)     Red and swelling    No Known Latex Allergy      Created by Conversion - 0;     Insects [Other] Other (See Comments)     Red and swelling       Past Medical History:   Diagnosis Date    Arthritis     BPH (benign prostatic hypertrophy)     CHF (congestive heart failure)     Clotting  disorder     Dementia     mild    Diabetes mellitus     Paget's disease of bone     Palpitations     Protein S deficiency     On chronic anticoagulation    Sinus bradycardia     Varicella        Past Surgical History:   Procedure Laterality Date    APPENDECTOMY      CHOLECYSTECTOMY      EYE SURGERY      GALLBLADDER SURGERY      JOINT REPLACEMENT      KNEE REPLACEMENT      SKIN GRAFT      TONSILLECTOMY         Social History     Socioeconomic History    Marital status: Married     Spouse name: Not on file    Number of children: Not on file    Years of education: Not on file    Highest education level: Not on file   Occupational History    Occupation: Former Personal assistant, used to work night shift   Tobacco Use    Smoking status: Never Smoker    Smokeless tobacco: Never Used   Substance and Sexual Activity    Alcohol use: No    Drug use: No    Sexual activity: Never   Social History Narrative    2 daughters with neurological issues         Family History   Problem Relation Age of Onset    BRCA 1/2 Brother     Arthritis Mother     Heart Disease Mother     High Blood Pressure Mother     Heart Disease Father     Stroke Father     Depression Other     Diabetes Other     Heart failure Other     Heart Disease Other     High Blood Pressure Other     Kidney Disease Other             Domestic Violence Screening   Negative  _0   Positive - Intimate Partner  _0  Other  _1  Remote Hx  _2    Depression Screening   Little Interest or Pleasure in Activities?  -  Not at all  _3                                                 Several days   _4   More then half of days  _5  Nearly every day  _6     Feeling down, depressed or hopeless? - Not at all  _7                                               Several days   _8   More then half of days  _9  Nearly every day  _10    PHQ - 9 Score  =  2       N/A - not indicated  _11        Loss of loved one in past year?  No  _12    Yes   _13              Aging Concerns   Trouble  Hearing - No  _14  Yes  _15  Details:   Memory Concerns - No  _16  Yes  _17  Details:   Fall in past year - No  _18  Yes  _19  Details:   Worsening Balance - No  _20  Yes  _21  Details:   Environmental Safety    Parameter Yes No Sometimes NA Details   Environmental Exposure x       Sunscreen  x      Seatbelt x       Cycling Helmet    x    Smoke detectors x       CO detectors x       Firearms   x        Health Care Maintenance   Parameter UTD Need NA Declined Comment   Dental Cleaning x       Vision Exam x       CRC screen x        EKG x       Lipid Panel  x       HbA1c x       STD screen   x     Hepatitis C x       Tdap or Td  x      Prevnar  x       Pneumovax x       Shingrex  x      Flu x         PHYSICAL EXAM   BP 96/62    Pulse 69    Temp 36.4 C (97.5 F)    Ht 1.803 m (5' 10.98")    Wt 90.8 kg (200 lb 1.6 oz)    SpO2 95%    BMI 27.92 kg/m     GEN: Alert, pleasant well adult in NAD.   HEENT: Normocephalic and atraumatic.PERRL. Moist mucous membranes. TM's clear BL  NECK: Supple, no lymphadenopathy or thyromegaly   PULM: Easy respirations, well aerated, CTA bilaterally.   CVS: RRR, no murmur, normal S1& S2. Equal and strong radial pulses.   ABD:  soft, nontender,  nondistended, no hepatosplenomegaly, no masses.   SKIN: No rashes or concerning lesions    NEURO: steady gait  Ext: no clubbing, edema or cyanosis       Assessment/Plan   1. Complete Physical Exam    2. Type 2 diabetes mellitus with other specified complication  Last O1B above goal <7.5  Continue current medications metformin and victoza; actos was added by endocrine  - Hemoglobin A1c; Standing    3. Atrial fibrillation  Continue apixaban     Care Planning    Health Care Proxy:   Paperwork as part of Record? _0   Yes   _1    No  _2   Awaiting return of forms  Living will completed ?  _3   Yes   _4    No     Discussed / Health Education   x Parameter    Importance of recommended health screenings and timing of stopping     Importance of recommended vaccines      Cholesterol and other lab values    PSA/Prostate Cancer    Appropriateness of ASA for primary prevention     Bone Health/Calcium and Vitamin D    Appropriate Weight/Weight loss goals    Regular Exercise    Sleep    Dental/Vision/Hearing    Sun exposure/Sunscreen use    Seatbelts/Helments/Saftey other     Safe alcohol use    Smoking Cessation    Healthcare proxy     Living Will    Code status    Organ Donation     Stress/Family issue    Caregiver Stress     Domestic Violence    Sexual issues     Return in about 6 months (around 06/03/2020) for Follow-up HTN, DM2.    Georgiann Mccoy, MD, MPH  [facility]

## 2019-12-24 ENCOUNTER — Other Ambulatory Visit: Payer: Self-pay | Admitting: Urology

## 2019-12-31 ENCOUNTER — Other Ambulatory Visit: Payer: Self-pay | Admitting: Pulmonary Disease

## 2019-12-31 DIAGNOSIS — Z23 Encounter for immunization: Secondary | ICD-10-CM

## 2020-01-05 ENCOUNTER — Telehealth: Payer: Self-pay | Admitting: Urology

## 2020-01-05 NOTE — Telephone Encounter (Signed)
Copied from [place] [id number]. Topic: Appointments - Schedule Appointment  >> Jan 05, 2020 10:37 AM Tyson Babinski, Butch Penny wrote:  Jon [name] - Wife  called, patient is scheduled for an NPV on:    Date: Not Scheduled - Wants a telehome - last seen 09/2015 Dr. Werner Lean - last refill was D. Gioradano  Provider: Jolly Mango  Diagnosis/Symptoms: Incontinence       Have records been sent? No, have records been requested? no  Records from referring physician must be received prior to appointment date  Has the patient requested a sooner appointment? Yes - Is patient experiencing symptoms that would require a sooner appointment? No - patient is requesting a sooner appointment because patient will be out of mirabegron (MYRBETRIQ) 50 MG 24 hr tablet this week and needs to have a new prescription.  Pharmacy advised he needs to have an appointment  Has patient been added to wait list and notified they will be contacted if a sooner appointment becomes available? no  Is patient comfortable seeing an NP/ PA within the practice? yes  Is patient willing to travel to other locations for sooner appointment? yes    Does patient need a return call? Yes     Confirmed phone number: [phone]      IF RECORDS HAVE NOT [place] THEM FAXED TO OFFICE THE APPOINTMENT IS SCHEDULED AT      [facility]: [place]: [place]: [place]: [place]: [place]: [place]: [place]: [phone]  Lesly RubensteinHK:[id number]    .

## 2020-01-07 NOTE — Telephone Encounter (Signed)
It would be a NPV and he wants a telehome call.  See below and advise if you want him to come in or not, please

## 2020-01-08 NOTE — Telephone Encounter (Signed)
Called and spoke to wife and offered televideo and can't do video so set up for a telehome on 1/27 @ 2:pm.

## 2020-01-13 ENCOUNTER — Ambulatory Visit: Payer: Medicare (Managed Care) | Admitting: Urology

## 2020-01-13 DIAGNOSIS — N3281 Overactive bladder: Secondary | ICD-10-CM

## 2020-01-13 MED ORDER — MIRABEGRON ER 50 MG PO TB24 *I*
50.0000 mg | ORAL_TABLET | Freq: Every day | ORAL | 3 refills | Status: DC
Start: 2020-01-13 — End: 2020-06-03

## 2020-01-13 NOTE — Progress Notes (Signed)
Telephone Visit     This is a new patient visit.    The plan was discussed with the patient and the patient/patient rep demonstrated understanding to the provider's satisfaction.    Consent was previously obtained from the patient to complete this telephone consult; including the potential for financial liability.    11-20 minutes was spent reviewing the EMR and management of this patient.             [facility] Urology Follow Up Visit    Jon [name]  01/13/2020    Chief complaint : bph f/u  Pt is accompanied by his wife  HPI: Patient last seen in 2016. He is seen by our office for LUTs. He has been maintained on myrbetriq for years and continues to do well. He needs a refill on myrbetriq. No UTIs. Denies gross hematuria. Previously had a rash    on the head of his uncircumcised penis which has now completely resolved.  He retract the foreskin to wash 2 times per week. No other complaints.     Changes since prior visit :   PMH -   Past Medical History:   Diagnosis Date    Arthritis     BPH (benign prostatic hypertrophy)     CHF (congestive heart failure)     Clotting disorder     Dementia     mild    Diabetes mellitus     Paget's disease of bone     Palpitations     Protein S deficiency     On chronic anticoagulation    Sinus bradycardia     Varicella      PSH -   Past Surgical History:   Procedure Laterality Date    APPENDECTOMY      CHOLECYSTECTOMY      EYE SURGERY      GALLBLADDER SURGERY      JOINT REPLACEMENT      KNEE REPLACEMENT      SKIN GRAFT      TONSILLECTOMY       Medications -   Current Outpatient Medications   Medication Sig    blood glucose monitor (FREESTYLE LITE) kit TEST AS DIRECTED    rosuvastatin (CRESTOR) 10 MG tablet TAKE 1 TABLET BY MOUTH EVERY DAY WITH DINNER    pioglitazone (ACTOS) 15 MG tablet Take 1 tablet (15 mg total) by mouth daily    blood glucose (ONETOUCH VERIO) test strip Test  1 times a day.   Brand name of strips one touch verio    Lancets Micro Thin  33G MISC Use as directed once daily. Dispense lancets for one touch verio    blood glucose monitor w/Device KIT Test as directed    Sacubitril-Valsartan (ENTRESTO) 24-26 MG per tablet Take 1 tablet by mouth 2 times daily    Coenzyme Q10 200 MG capsule Take 200 mg by mouth daily    Omega-3 Fatty Acids (FISH OIL) 1000 MG CAPS capsule Take 1 g by mouth daily    VICTOZA 18 MG/3ML injection INJECT 1.8 MG SUBCUTANEOUSLY (UNDER THE SKIN) EVERY DAY    memantine (NAMENDA) 10 MG tablet TAKE 1 TABLET BY MOUTH TWO TIMES DAILY    apixaban (ELIQUIS) 5 MG tablet Take 1 tablet (5 mg total) by mouth 2 times daily    metoprolol (TOPROL-XL) 25 MG 24 hr tablet Take 25 mg by mouth every 12 hours    BD PEN NEEDLE NANO 2ND GEN 32G X 4 MM USE  ONE TIME DAILY AS DIRECTED WITH VICTOZA PENS    mirabegron (MYRBETRIQ) 50 MG 24 hr tablet Take 1 tablet (50 mg total) by mouth daily    finasteride (PROSCAR) 5 MG tablet Take 1 tablet (5 mg total) by mouth daily For use by men only.    dofetilide (TIKOSYN) 250 MCG capsule Take 250 mcg by mouth 2 times daily    metFORMIN (GLUCOPHAGE) 1000 MG tablet TAKE 1 TABLET BY MOUTH TWO TIMES DAILY WITH MEALS    amoxicillin (AMOXIL) 500 MG capsule TAKE 4 CAPSULES BY MOUTH ONE HOUR BEFORE DENTAL APPOINTMENTS    lancets (ONETOUCH ULTRASOFT) Use   2  times per day as instructed for blood glucose testing.    Blood Glucose Calibration (OT ULTRA/FASTTK CNTRL SOLN) SOLN As directed    CINNAMON PO Take by mouth    Co-Enzyme Q-10 100 MG CAPS Take by mouth    Multiple Vitamins-Minerals (MULTI VITAMIN/MINERALS PO) Take by mouth    cholecalciferol (VITAMIN D) 1000 UNIT tablet Take 2,000 Units by mouth daily        FISH OIL by Does not apply route daily.    acetaminophen (TYLENOL) 500 MG tablet Take 500 mg by mouth 2 times daily.     Allergies - no change  ROS - no change    Physical Exam :   There were no vitals taken for this visit.      Studies reviewed -   None     Diagnosis established -BPH with  LUTS,  pt's memory issues and h/o vascular dementia currently on every day myrbetriq; glucosuria    Plan of care -continue myrbetriq, refilled today   FU one year

## 2020-01-21 ENCOUNTER — Other Ambulatory Visit: Payer: Self-pay | Admitting: Primary Care

## 2020-01-21 DIAGNOSIS — E119 Type 2 diabetes mellitus without complications: Secondary | ICD-10-CM

## 2020-01-22 NOTE — Telephone Encounter (Signed)
LOV  12/04/2019    Future 06/03/2020

## 2020-02-11 ENCOUNTER — Other Ambulatory Visit: Payer: Self-pay | Admitting: Primary Care

## 2020-02-17 ENCOUNTER — Other Ambulatory Visit
Admission: RE | Admit: 2020-02-17 | Discharge: 2020-02-17 | Disposition: A | Discharge status: Home / Self Care | Payer: Medicare (Managed Care) | Source: Ambulatory Visit | Attending: Primary Care | Admitting: Primary Care

## 2020-02-17 DIAGNOSIS — E1169 Type 2 diabetes mellitus with other specified complication: Secondary | ICD-10-CM | POA: Insufficient documentation

## 2020-02-17 LAB — HEMOGLOBIN A1C: Hemoglobin A1C: 7.1 % — ABNORMAL HIGH

## 2020-02-20 ENCOUNTER — Telehealth: Payer: Self-pay | Admitting: Primary Care

## 2020-02-20 DIAGNOSIS — R7989 Other specified abnormal findings of blood chemistry: Secondary | ICD-10-CM

## 2020-02-20 DIAGNOSIS — E1169 Type 2 diabetes mellitus with other specified complication: Secondary | ICD-10-CM

## 2020-02-20 DIAGNOSIS — Z125 Encounter for screening for malignant neoplasm of prostate: Secondary | ICD-10-CM

## 2020-02-20 NOTE — Telephone Encounter (Signed)
A1c much improved. Has reached goal of <7.5 given age. Will plan to repeat A1c in 3 months, prior to next office visit (given recent high A1c levels). Also due for urine microalbumin.     Needs to have non-fasting labs drawn in 3 months; at least a few days prior to next office visit on 06/03/20.       Georgiann Mccoy, MD, MPH  [facility]

## 2020-02-21 ENCOUNTER — Ambulatory Visit: Payer: Medicare (Managed Care) | Attending: Pulmonary Disease

## 2020-02-21 DIAGNOSIS — Z23 Encounter for immunization: Secondary | ICD-10-CM | POA: Insufficient documentation

## 2020-02-22 ENCOUNTER — Encounter: Payer: Self-pay | Admitting: Endocrinology

## 2020-02-22 ENCOUNTER — Ambulatory Visit: Payer: Medicare (Managed Care) | Admitting: Endocrinology

## 2020-02-22 VITALS — BP 101/56 | HR 71 | Ht 71.5 in | Wt 201.0 lb

## 2020-02-22 DIAGNOSIS — E1165 Type 2 diabetes mellitus with hyperglycemia: Secondary | ICD-10-CM

## 2020-02-22 DIAGNOSIS — IMO0002 Reserved for concepts with insufficient information to code with codable children: Secondary | ICD-10-CM

## 2020-02-22 NOTE — Telephone Encounter (Addendum)
02/22/2020 09:15 AM Phone (Outgoing) Monica Becton, Ricko Macdonell (Self) [phone] (H)    Spoke to Other Person - spoke to wife with patient on speaker phone         Read verbatim DR Woz's message listed below.  Patient and wife state understanding.  Advised to call back with any further questions or concerns.       Patient and wife had first Covid vaccine yesterday     Pfizer brand ~~will bring card to office at next appointment     Advised to call with any questions or concerns

## 2020-02-22 NOTE — Patient Instructions (Signed)
Continue the Metformin and Actos    Finish the Victoza at home and then begin Ozempic.  Start at 0.25 mg once weekly for 4 weeks and then increase to 0.5 mg once weekly

## 2020-02-22 NOTE — Progress Notes (Signed)
Subjective:      Jon [name] is a 86 y.o. male who presents for follow up of type 2 diabetes.      INTERVAL HISTORY:  He has been doing well since last vist  --Treatment history:  --Current management:  Current diabetic medications include:  Metformin 1 gm bid, Victoza 1.8 mg daily and Actos 15 mg daily   -- Self-glucose monitoring: 1-2 times/day.  Running 96-178 mg/dL.  Highest right after eating  -- Hyperglycemia: none severe  -- Hypoglycemia: denies confusion, dizziness, headache, seizures, sweating, nightmares and LOC.  Hypoglycemia unawareness: no  -- Endocrine: denies polyuria, polydipsia.  -- CV: denies chest pain, shortness of breath.  --Extremities:     -- Hands: denies tingling,numbness, pain     -- Feet: denies tingling, numbness, pain, foot ulcers  -- Eyes: denies history of laser treatment, double/blurry vision, retinopathy, cataracts, glaucoma, Last eye exam: 2020.  -- Kidney: denies history of nephropathy.  -- Diet:  Working on decreasing portion sizes of carbohydrates and avoiding sweets  -- Exercise:   He has been exercising at the [facility] again on a regular basis      Patient's medications, allergies, past medical, surgical, social and family histories were reviewed and updated as appropriate.      Review of Systems  CONSTITUTIONAL:  Appetite good, no fevers, night sweats or weight loss  HEAD: No headache, dizziness, or syncope  EYES:No vision changes or eye pain  CV: No chest pain, shortness of breath, or edema.  RESPIRATORY:  No cough, wheezing, or dyspnea  GI:  No nausea, vomiting, abdominal pain, or change in bowel habits  NEURO:  No mental status changes, motor weakness, or sensory changes  MS:  No joint pain, swelling, or musculoskeletal deformities  ENDOCRINE:  No polyuria, polydipsia, or heat intolerance.       Objective:     BP 101/56 (BP Location: Right arm, Patient Position: Sitting)    Pulse 71    Ht 1.816 m (5' 11.5")    Wt 91.2 kg (201 lb)    BMI 27.64 kg/m   GENERAL APPEARANCE:  well developed, well nourished, no acute distress; aware and alert  HEENT: PERLA, EOMI, no lid lag, pink conjunctiva ; no proptosis   NECK: supple, no thyromegaly, no lymphadenopathy  HEART: RRR, normal S1, S2  CHEST: lungs clear to auscultation bilaterally  EXTREMITIES: no clubbing, cyanosis, or edema. Pedal pulses 2+ bilaterally.   NEUROLOGICAL: Alert and oriented x 3. Sensitive to monofilament.         Lab Review  Lab Results   Component Value Date/Time    NA 137 11/09/2019 09:19 AM    K 5.0 11/09/2019 09:19 AM    CL 104 11/09/2019 09:19 AM    UN 22 (H) 11/09/2019 09:19 AM    CREAT 1.27 (H) 11/09/2019 09:19 AM    MALBR 0.81 09/05/2016 08:30 AM     Lab Results   Component Value Date/Time    CHOL 126 11/09/2019 09:19 AM    TRIG 105 11/09/2019 09:19 AM    HDL 45 11/09/2019 09:19 AM    LDLC 60 11/09/2019 09:19 AM     Results for Jon, [name] (MRN [id number]) as of 02/22/2020 17:10   Ref. Range 11/09/2019 09:19 02/17/2020 10:44   Hemoglobin A1C Latest Units: % 9.2 (H) 7.1 (H)           Assessment:          Jon [name] is a 86 y.o.  male with Uncontrolled type 2 diabetes mellitus.  A1c now at target on current medications.  He has no complaints of symptoms of hyper or hypoglycemia.  Will change him to Ozempic from [place] since it is a little more potent and a weekly injection.          Plan:     TESTS:  A1c at next visit          TREATMENT:  Continue Metformin and Actos without change.  Once his current supply of Victoza is gone, he will begin Ozempic 0.25 mg weekly for 4 weeks and then begin 0.5 mg weekly.      RETURN IN:  3 months    Thank you for allowing me to participate in the care of this patient.  Please feel free to contact me if you have any questions or concerns.

## 2020-02-24 ENCOUNTER — Other Ambulatory Visit: Payer: Self-pay | Admitting: Primary Care

## 2020-02-24 MED ORDER — FINASTERIDE 5 MG PO TABS *I*
5.0000 mg | ORAL_TABLET | Freq: Every day | ORAL | 3 refills | Status: DC
Start: 2020-02-24 — End: 2021-02-01

## 2020-02-24 NOTE — Telephone Encounter (Signed)
lov 12/04/2019  Next ov 06/03/2020

## 2020-03-09 ENCOUNTER — Telehealth: Payer: Self-pay

## 2020-03-09 NOTE — Telephone Encounter (Signed)
Does have protein S deficiency; can see this in notes as fall back as 2011 from heme/onc. However, I looked through his chart and could not find blood test results. It was done, but I cannot find results for it. This is a clotting disorder and puts patient at increased risk for blood clots, hence on eliquis to prevent clots.     Georgiann Mccoy, MD, MPH  [facility]

## 2020-03-09 NOTE — Telephone Encounter (Signed)
Re: Patient called to inquire as to whether or not his past blood work shows is he has the "S" Protein in his system: patient states that it is a genetic disorder that runs in the family and said that he thinks that he has had this blood test ordered in the past     Please call     Thank you

## 2020-03-09 NOTE — Telephone Encounter (Signed)
Spoke with pts wife, she is who called earlier. Relayed message to her from Dr. Jolaine Click. She v/u and appreciation.

## 2020-03-13 ENCOUNTER — Ambulatory Visit: Payer: Medicare (Managed Care) | Attending: Pulmonary Disease

## 2020-03-13 DIAGNOSIS — Z23 Encounter for immunization: Secondary | ICD-10-CM | POA: Insufficient documentation

## 2020-05-12 ENCOUNTER — Telehealth: Payer: Self-pay | Admitting: Endocrinology

## 2020-05-12 MED ORDER — SEMAGLUTIDE(0.25 OR 0.5MG/DOS) 2 MG/1.5ML SC SOPN *A*
0.5000 mg | PEN_INJECTOR | SUBCUTANEOUS | 6 refills | Status: DC
Start: 2020-05-12 — End: 2020-11-13

## 2020-05-12 NOTE — Telephone Encounter (Signed)
Script sent to pharmacy.  Unable to leave message on phone

## 2020-05-12 NOTE — Telephone Encounter (Signed)
Copied from [place] [id number]. Topic: Medications/Prescriptions - Medication Question/Problem  >> May 12, 2020 11:27 AM Alaa Eyerman-Parnell, Darrick Penna wrote:  The patients wife, Jon [name], is calling to ask Latanya Presser, NP, about her husbands Osempic. She stated that he is on a sample for the medication right now and would like to know rather a prescription was going to be sent in for him?    Please call her back at [phone], a message can be left

## 2020-05-28 ENCOUNTER — Other Ambulatory Visit: Payer: Self-pay | Admitting: Endocrinology

## 2020-05-28 DIAGNOSIS — IMO0002 Reserved for concepts with insufficient information to code with codable children: Secondary | ICD-10-CM

## 2020-06-03 ENCOUNTER — Ambulatory Visit: Payer: Medicare (Managed Care) | Attending: Primary Care | Admitting: Primary Care

## 2020-06-03 ENCOUNTER — Encounter: Payer: Self-pay | Admitting: Primary Care

## 2020-06-03 VITALS — BP 106/68 | HR 70 | Temp 96.7°F | Ht 71.5 in | Wt 196.0 lb

## 2020-06-03 DIAGNOSIS — E7800 Pure hypercholesterolemia, unspecified: Secondary | ICD-10-CM

## 2020-06-03 DIAGNOSIS — I10 Essential (primary) hypertension: Secondary | ICD-10-CM

## 2020-06-03 DIAGNOSIS — E1169 Type 2 diabetes mellitus with other specified complication: Secondary | ICD-10-CM | POA: Insufficient documentation

## 2020-06-03 DIAGNOSIS — I509 Heart failure, unspecified: Secondary | ICD-10-CM | POA: Insufficient documentation

## 2020-06-03 DIAGNOSIS — R7989 Other specified abnormal findings of blood chemistry: Secondary | ICD-10-CM | POA: Insufficient documentation

## 2020-06-03 LAB — MICROALBUMIN, URINE, RANDOM
Creatinine,UR: 161 mg/dL (ref 20–300)
Microalb/Creat Ratio: 10.1 mg MA/g CR (ref 0.0–29.9)
Microalbumin,UR: 1.63 mg/dL

## 2020-06-03 LAB — HM DIABETES FOOT EXAM

## 2020-06-03 NOTE — Progress Notes (Signed)
[facility] Medicine Office Visit Note    Chief Complaint   Patient presents with    Follow-up     accompanied by wife    Hypertension    Diabetes       Subjective:  Jon [name] is a 86 y.o. male here for:    Here to review lab results    Dm2   Denies polydipsia, polyuria, dizziness/  Has numbness feet      HTN  Adherent to medications  Denies blurry vision, dizziness or lightheadedness. =    HLD  Adherent to statin    ROS: Per HPI    Patient's medications, allergies, and problem list were reviewed.        Current Outpatient Medications:     pioglitazone (ACTOS) 15 MG tablet, TAKE 1 TABLET BY MOUTH EVERY DAY, Disp: 30 tablet, Rfl: 6    semaglutide 0.25 mg or 0.5 mg dose (OZEMPIC) pen, Inject 0.38 mLs (0.5 mg total) into the skin once a week, Disp: 1.5 mL, Rfl: 6    finasteride (PROSCAR) 5 MG tablet, Take 1 tablet (5 mg total) by mouth daily For use by men only., Disp: 90 tablet, Rfl: 3    ELIQUIS 5 MG tablet, TAKE 1 TABLET BY MOUTH TWO TIMES DAILY, Disp: 180 tablet, Rfl: 1    metFORMIN (GLUCOPHAGE) 1000 MG tablet, TAKE 1 TABLET BY MOUTH TWO TIMES DAILY WITH MEALS, Disp: 180 tablet, Rfl: 3    blood glucose monitor (FREESTYLE LITE) kit, TEST AS DIRECTED, Disp: , Rfl:     rosuvastatin (CRESTOR) 10 MG tablet, TAKE 1 TABLET BY MOUTH EVERY DAY WITH DINNER, Disp: 90 tablet, Rfl: 3    blood glucose (ONETOUCH VERIO) test strip, Test  1 times a day.   Brand name of strips one touch verio, Disp: 50 strip, Rfl: 6    Lancets Micro Thin 33G MISC, Use as directed once daily. Dispense lancets for one touch verio, Disp: 100 each, Rfl: 5    blood glucose monitor w/Device KIT, Test as directed, Disp: 1 kit, Rfl: 0    Sacubitril-Valsartan (ENTRESTO) 24-26 MG per tablet, Take 1 tablet by mouth 2 times daily, Disp: , Rfl:     Coenzyme Q10 200 MG capsule, Take 200 mg by mouth daily, Disp: , Rfl:     Omega-3 Fatty Acids (FISH OIL) 1000 MG CAPS capsule, Take 1 g by mouth daily, Disp: , Rfl:      memantine (NAMENDA) 10 MG tablet, TAKE 1 TABLET BY MOUTH TWO TIMES DAILY, Disp: 180 tablet, Rfl: 3    metoprolol (TOPROL-XL) 25 MG 24 hr tablet, Take 25 mg by mouth every 12 hours, Disp: , Rfl:     BD PEN NEEDLE NANO 2ND GEN 32G X 4 MM, USE ONE TIME DAILY AS DIRECTED WITH VICTOZA PENS, Disp: 100 each, Rfl: 3    mirabegron (MYRBETRIQ) 50 MG 24 hr tablet, Take 1 tablet (50 mg total) by mouth daily, Disp: 90 tablet, Rfl: 2    dofetilide (TIKOSYN) 250 MCG capsule, Take 250 mcg by mouth 2 times daily, Disp: , Rfl:     lancets (ONETOUCH ULTRASOFT), Use   2  times per day as instructed for blood glucose testing., Disp: 100 each, Rfl: 3    Blood Glucose Calibration (OT ULTRA/FASTTK CNTRL SOLN) SOLN, As directed, Disp: 1 each, Rfl: 5    CINNAMON PO, Take by mouth, Disp: , Rfl:     Multiple Vitamins-Minerals (MULTI VITAMIN/MINERALS PO), Take by  mouth, Disp: , Rfl:     cholecalciferol (VITAMIN D) 1000 UNIT tablet, Take 2,000 Units by mouth daily    , Disp: , Rfl:     amoxicillin (AMOXIL) 500 MG capsule, TAKE 4 CAPSULES BY MOUTH ONE HOUR BEFORE DENTAL APPOINTMENTS (Patient not taking: Reported on 06/03/2020), Disp: , Rfl: 0    acetaminophen (TYLENOL) 500 MG tablet, Take 500 mg by mouth 2 times daily., Disp: , Rfl:     Objective:  Physical exam:  BP 106/68    Pulse 70    Temp 35.9 C (96.7 F)    Ht 1.816 m (5' 11.5")    Wt 88.9 kg (196 lb)    SpO2 97%    BMI 26.96 kg/m   General appearance: Well appearing, in NAD.   Cardiovascular:  Regular rate and rhythm, no murmurs, rubs, or gallops.     Pulmonary:  Lungs clear to auscultation bilaterally, without wheezes, rales or rhonchi.  Neuro:  Alert. CN II-XII grossly intact.   Extrem: Bilateral feet- skin intact. Distal pulses palpable. Monofilament test intact.     Assessment/Plan:  1. Type 2 diabetes mellitus; A1c at goal; patient motivated to make diet/exercise changes. Will continue current medications - actos 67m daily, metformin 1008mBID, ozempic 0.67m54mweekly  - repeat A1c in 6 months  - Hemoglobin A1c; Future  - Basic metabolic panel; Future    2. Elevated TSH  - recheck TSH in 3 months; if continues to be elevated, start levothyroxine  - TSH; Future  - T4, free; Future    3. Elevated serum creatinine  - differential includes dehydration; BP at goal today  - increase po water intake prior to next blood draw    4. Hypercholesterolemia  - continue rosuvastatin 92m50mily    5. HTN, at goal today  - continue current medications.     Return in about 6 months (around 12/03/2020) for Yearly Check-up, AWV.    ShauGeorgiann Mccoy, MPH  Family Medicine

## 2020-06-08 ENCOUNTER — Other Ambulatory Visit: Payer: Self-pay

## 2020-06-08 ENCOUNTER — Encounter: Payer: Self-pay | Admitting: Gastroenterology

## 2020-06-08 DIAGNOSIS — E119 Type 2 diabetes mellitus without complications: Secondary | ICD-10-CM

## 2020-06-08 NOTE — Telephone Encounter (Signed)
Copied from [place] [phone]. Topic: Medications/Prescriptions - Refill Request  >> Jun 08, 2020 11:34 AM Juliette Mangle wrote:  ToniAnne with [facility] calling to request prescription(s)  Bd Pen needles 32 gage 4 mm  to be sent to the following Pharmacy [facility].    Is patient out of the medication? No    Patient's last visit? 02/22/20    Delila Pereyra  can be reached if necessary at  [phone]   .

## 2020-06-10 MED ORDER — BD PEN NEEDLE NANO 2ND GEN 32G X 4 MM MISC
2 refills | Status: AC
Start: 2020-06-10 — End: ?

## 2020-06-13 ENCOUNTER — Encounter: Payer: Self-pay | Admitting: Primary Care

## 2020-06-15 LAB — HM DIABETES EYE EXAM

## 2020-06-24 LAB — UNMAPPED LAB RESULTS
Hematocrit (HT): 35 % — ABNORMAL LOW (ref 40–52)
Hemoglobin (HGB) (HT): 11.4 g/dL — ABNORMAL LOW (ref 13.0–17.5)
MCHC (HT): 32.4 g/dL — NL (ref 32.0–36.0)
MCV (HT): 92.6 fL — NL (ref 81.0–99.0)
Mean Corpuscular Hemoglobin (MCH) (HT): 30 pg — NL (ref 26.0–34.0)
Platelets (HT): 168 10 3/uL — NL (ref 140–400)
RBC (HT): 3.8 10 6/uL — ABNORMAL LOW (ref 4.20–5.90)
RDW (HT): 12.4 % — NL (ref 11.5–15.0)
WBC (HT): 5.3 10 3/uL — NL (ref 4.0–10.8)

## 2020-06-27 ENCOUNTER — Encounter: Payer: Self-pay | Admitting: Endocrinology

## 2020-06-27 ENCOUNTER — Ambulatory Visit: Payer: Medicare (Managed Care) | Admitting: Endocrinology

## 2020-06-27 VITALS — BP 121/68 | HR 80 | Ht 72.0 in | Wt 198.0 lb

## 2020-06-27 DIAGNOSIS — E1169 Type 2 diabetes mellitus with other specified complication: Secondary | ICD-10-CM

## 2020-06-27 NOTE — Progress Notes (Signed)
Subjective:      Jon [name] is a 86 y.o. male who presents for follow up of type 2 diabetes.        INTERVAL HISTORY:   No problems with tolerating the Ozempic.  He has been feeling well  --Treatment history:  --Current management:  Current diabetic medications include:  Metformin 1 gm bid, Actos 15 mg daily and Ozempic 0.5 mg weekly   -- Self-glucose monitoring:  2 times/day.  Range 102-182 mg/dL  -- Hyperglycemia: denies  -- Hypoglycemia: denies confusion, dizziness, headache, seizures, sweating, nightmares and LOC.  Hypoglycemia unawareness: no  -- Endocrine: denies polyuria, polydipsia.  -- CV: denies chest pain, shortness of breath.  --Extremities:     -- Hands: denies tingling,numbness, pain     -- Feet: denies tingling, numbness, pain, foot ulcers  -- Eyes: denies history of laser treatment, double/blurry vision, retinopathy, Last eye exam:  yearly.  -- Kidney: denies history of nephropathy.  -- Exercise:  Plays golf      Patient's medications, allergies, past medical, surgical, social and family histories were reviewed and updated as appropriate.      Review of Systems  CONSTITUTIONAL:  Appetite good, no fevers, night sweats or weight loss  HEAD: No headache, dizziness, or syncope  EYES:No vision changes or eye pain  CV: No chest pain, shortness of breath, or edema.  RESPIRATORY:  No cough, wheezing, or dyspnea  GI:  No nausea, vomiting, abdominal pain, or change in bowel habits  NEURO:  No mental status changes, motor weakness, or sensory changes  MS:  No joint pain, swelling, or musculoskeletal deformities  ENDOCRINE:  No polyuria, polydipsia, or heat intolerance.       Objective:     BP 121/68    Pulse 80    Ht 1.829 m (6')    Wt 89.8 kg (198 lb)    BMI 26.85 kg/m   GENERAL APPEARANCE: well developed, well nourished, no acute distress; aware and alert  HEENT: PERLA, EOMI, no lid lag, pink conjunctiva ; no proptosis  NECK: supple, no thyromegaly, no lymphadenopathy  HEART: RRR, normal S1,  S2  CHEST: lungs clear to auscultation bilaterally  EXTREMITIES: no clubbing, cyanosis, or edema.   NEUROLOGICAL: Alert and oriented x 3. Sensitive to monofilament.      Lab Review  Lab Results   Component Value Date/Time    NA 137 11/09/2019 09:19 AM    K 5.0 11/09/2019 09:19 AM    CL 104 11/09/2019 09:19 AM    UN 22 (H) 11/09/2019 09:19 AM    CREAT 1.27 (H) 11/09/2019 09:19 AM    MALBR 1.63 06/03/2020 12:05 PM     Lab Results   Component Value Date/Time    CHOL 126 11/09/2019 09:19 AM    TRIG 105 11/09/2019 09:19 AM    HDL 45 11/09/2019 09:19 AM    LDLC 60 11/09/2019 09:19 AM     Results for Jon, [name] (MRN [id number]) as of 06/27/2020 10:39   Ref. Range 07/06/2019 09:05 09/18/2019 00:00 11/09/2019 09:19 02/17/2020 10:44   Hemoglobin A1C Latest Units: % 8.4 (H)  9.2 (H) 7.1 (H)     06/24/2020:  HgbA1c 6.8%            Assessment:          Jon [name] is a 86 y.o. male with Uncontrolled type 2 diabetes mellitus.  Doing well with change to ozempic and A1c at target with no hypoglycemia.  Plan:   TESTS:  A1c at next visit          TREATMENT:   Continue Ozempic 0.5 mg weekly, Actos 15 mg daily and Metformin 1 gm bid      RETURN IN:  Six months    Thank you for allowing me to participate in the care of this patient.  Please feel free to contact me if you have any questions or concerns.

## 2020-06-29 ENCOUNTER — Encounter: Payer: Self-pay | Admitting: Primary Care

## 2020-08-05 ENCOUNTER — Other Ambulatory Visit: Payer: Self-pay | Admitting: Primary Care

## 2020-09-04 ENCOUNTER — Other Ambulatory Visit: Payer: Self-pay | Admitting: Primary Care

## 2020-09-05 NOTE — Telephone Encounter (Signed)
LOV--06/03/20  NOV--12/05/20  Future labs

## 2020-10-20 ENCOUNTER — Ambulatory Visit: Payer: Medicare (Managed Care) | Attending: Primary Care | Admitting: Primary Care

## 2020-10-20 ENCOUNTER — Encounter: Payer: Self-pay | Admitting: Primary Care

## 2020-10-20 VITALS — BP 100/60 | HR 81 | Temp 98.3°F

## 2020-10-20 DIAGNOSIS — R0981 Nasal congestion: Secondary | ICD-10-CM

## 2020-10-20 DIAGNOSIS — U071 COVID-19: Secondary | ICD-10-CM | POA: Insufficient documentation

## 2020-10-20 NOTE — Progress Notes (Signed)
Family Medicine Office Visit Note    CC: nasal congestion    Subjective:  RIKI [name] is a 86 y.o. male here for:    Wife tested positive for covid, both are vaccinated, she is quite ill. She tested positive 4-5 days prior.  Patient reports some nasal congestion, however he has had seasonal allergies for the past few months.  Patient denies cough, fever, chills, shortness of breath    ROS: Per HPI    Patient's medications, allergies, and problem list were reviewed.        Current Outpatient Medications:     memantine (NAMENDA) 10 MG tablet, TAKE 1 TABLET BY MOUTH TWO TIMES DAILY, Disp: 180 tablet, Rfl: 3    ELIQUIS 5 MG tablet, TAKE 1 TABLET BY MOUTH TWO TIMES DAILY, Disp: 180 tablet, Rfl: 1    insulin pen needle (BD PEN NEEDLE NANO 2ND GEN) 32G X 4 MM, Use 1 time per week., Disp: 30 each, Rfl: 2    pioglitazone (ACTOS) 15 MG tablet, TAKE 1 TABLET BY MOUTH EVERY DAY, Disp: 30 tablet, Rfl: 6    semaglutide 0.25 mg or 0.5 mg dose (OZEMPIC) pen, Inject 0.38 mLs (0.5 mg total) into the skin once a week, Disp: 1.5 mL, Rfl: 6    finasteride (PROSCAR) 5 MG tablet, Take 1 tablet (5 mg total) by mouth daily For use by men only., Disp: 90 tablet, Rfl: 3    metFORMIN (GLUCOPHAGE) 1000 MG tablet, TAKE 1 TABLET BY MOUTH TWO TIMES DAILY WITH MEALS, Disp: 180 tablet, Rfl: 3    blood glucose monitor (FREESTYLE LITE) kit, TEST AS DIRECTED, Disp: , Rfl:     rosuvastatin (CRESTOR) 10 MG tablet, TAKE 1 TABLET BY MOUTH EVERY DAY WITH DINNER, Disp: 90 tablet, Rfl: 3    blood glucose (ONETOUCH VERIO) test strip, Test  1 times a day.   Brand name of strips one touch verio, Disp: 50 strip, Rfl: 6    Lancets Micro Thin 33G MISC, Use as directed once daily. Dispense lancets for one touch verio, Disp: 100 each, Rfl: 5    blood glucose monitor w/Device KIT, Test as directed, Disp: 1 kit, Rfl: 0    Sacubitril-Valsartan (ENTRESTO) 24-26 MG per tablet, Take 1 tablet by mouth 2 times daily, Disp: , Rfl:     Coenzyme Q10 200 MG  capsule, Take 200 mg by mouth daily, Disp: , Rfl:     Omega-3 Fatty Acids (FISH OIL) 1000 MG CAPS capsule, Take 1 g by mouth daily, Disp: , Rfl:     metoprolol (TOPROL-XL) 25 MG 24 hr tablet, Take 25 mg by mouth every 12 hours, Disp: , Rfl:     mirabegron (MYRBETRIQ) 50 MG 24 hr tablet, Take 1 tablet (50 mg total) by mouth daily, Disp: 90 tablet, Rfl: 2    dofetilide (TIKOSYN) 250 MCG capsule, Take 250 mcg by mouth 2 times daily, Disp: , Rfl:     amoxicillin (AMOXIL) 500 MG capsule, TAKE 4 CAPSULES BY MOUTH ONE HOUR BEFORE DENTAL APPOINTMENTS (Patient not taking: Reported on 06/03/2020), Disp: , Rfl: 0    lancets (ONETOUCH ULTRASOFT), Use   2  times per day as instructed for blood glucose testing., Disp: 100 each, Rfl: 3    Blood Glucose Calibration (OT ULTRA/FASTTK CNTRL SOLN) SOLN, As directed, Disp: 1 each, Rfl: 5    CINNAMON PO, Take by mouth, Disp: , Rfl:     Multiple Vitamins-Minerals (MULTI VITAMIN/MINERALS PO), Take by mouth, Disp: , Rfl:  cholecalciferol (VITAMIN D) 1000 UNIT tablet, Take 2,000 Units by mouth daily    , Disp: , Rfl:     acetaminophen (TYLENOL) 500 MG tablet, Take 500 mg by mouth 2 times daily., Disp: , Rfl:     Objective:  Physical exam:  BP 100/60    Pulse 81    Temp 36.8 C (98.3 F)    SpO2 97%   General appearance: Well appearing  HEENT: No cervical adenopathy.   Cardiovascular:  Regular rate and rhythm, no murmurs, rubs, or gallops.     Pulmonary:  Lungs clear to auscultation bilaterally, without wheezes, rales or rhonchi.  Neuro:  Alert. CN II-XII grossly intact.     Assessment/Plan:  1. Nasal congestion  - symptoms c/w allergic rhinitis versus mild upper respiratory infection  -Covid test collected  -Monitor for worsening symptoms      Return if symptoms worsen or fail to improve.    Georgiann Mccoy, MD, MPH, MA  Family Medicine  [facility]

## 2020-10-21 ENCOUNTER — Telehealth: Payer: Self-pay | Admitting: Primary Care

## 2020-10-21 LAB — COVID-19 NAAT (PCR): COVID-19 NAAT (PCR): POSITIVE — AB

## 2020-10-21 LAB — COVID-19 PCR

## 2020-10-21 NOTE — Telephone Encounter (Addendum)
Writer spoke to daughter Altha Harm. She stated that patient only has mild cold symptoms and, overall is feeling ok. Patient's wife went to the [facility] ER last night because of decreased Oxygen level but they sent her home. Wife is maintaining quarantine in a room away from patient and daughter is here from [place] to help.

## 2020-10-21 NOTE — Telephone Encounter (Signed)
Positive for covid-19. Will route to nurse to call patient to inform and inquire about current symptoms.     Georgiann Mccoy, MD, MPH  [facility]

## 2020-11-13 ENCOUNTER — Other Ambulatory Visit: Payer: Self-pay | Admitting: Endocrinology

## 2020-12-05 ENCOUNTER — Ambulatory Visit: Payer: Medicare (Managed Care) | Admitting: Primary Care

## 2020-12-14 ENCOUNTER — Other Ambulatory Visit: Payer: Self-pay | Admitting: Primary Care

## 2020-12-14 DIAGNOSIS — E7800 Pure hypercholesterolemia, unspecified: Secondary | ICD-10-CM

## 2020-12-15 NOTE — Telephone Encounter (Signed)
LOV 06/03/20. NOV 12/23/20

## 2020-12-23 ENCOUNTER — Other Ambulatory Visit
Admission: RE | Admit: 2020-12-23 | Discharge: 2020-12-23 | Disposition: A | Discharge status: Home / Self Care | Payer: Medicare (Managed Care) | Source: Ambulatory Visit | Attending: Primary Care | Admitting: Primary Care

## 2020-12-23 ENCOUNTER — Ambulatory Visit: Payer: Medicare (Managed Care) | Admitting: Primary Care

## 2020-12-23 ENCOUNTER — Encounter: Payer: Self-pay | Admitting: Primary Care

## 2020-12-23 VITALS — BP 108/66 | HR 70 | Ht 72.01 in | Wt 194.1 lb

## 2020-12-23 DIAGNOSIS — I10 Essential (primary) hypertension: Secondary | ICD-10-CM

## 2020-12-23 DIAGNOSIS — E7800 Pure hypercholesterolemia, unspecified: Secondary | ICD-10-CM

## 2020-12-23 DIAGNOSIS — R7989 Other specified abnormal findings of blood chemistry: Secondary | ICD-10-CM | POA: Insufficient documentation

## 2020-12-23 DIAGNOSIS — Z23 Encounter for immunization: Secondary | ICD-10-CM

## 2020-12-23 DIAGNOSIS — I4891 Unspecified atrial fibrillation: Secondary | ICD-10-CM

## 2020-12-23 DIAGNOSIS — Z0000 Encounter for general adult medical examination without abnormal findings: Secondary | ICD-10-CM

## 2020-12-23 DIAGNOSIS — E1169 Type 2 diabetes mellitus with other specified complication: Secondary | ICD-10-CM | POA: Insufficient documentation

## 2020-12-23 DIAGNOSIS — I509 Heart failure, unspecified: Secondary | ICD-10-CM | POA: Insufficient documentation

## 2020-12-23 DIAGNOSIS — Z125 Encounter for screening for malignant neoplasm of prostate: Secondary | ICD-10-CM | POA: Insufficient documentation

## 2020-12-23 LAB — LIPID PANEL
Chol/HDL Ratio: 2.7
Cholesterol: 137 mg/dL
HDL: 51 mg/dL (ref 40–60)
LDL Calculated: 70 mg/dL
Non HDL Cholesterol: 86 mg/dL
Triglycerides: 78 mg/dL

## 2020-12-23 LAB — BASIC METABOLIC PANEL
Anion Gap: 10 (ref 7–16)
CO2: 26 mmol/L (ref 20–28)
Calcium: 9.5 mg/dL (ref 8.6–10.2)
Chloride: 106 mmol/L (ref 96–108)
Creatinine: 1.17 mg/dL (ref 0.67–1.17)
GFR,Black: 65 *
GFR,Caucasian: 57 * — AB
Glucose: 115 mg/dL — ABNORMAL HIGH (ref 60–99)
Lab: 25 mg/dL — ABNORMAL HIGH (ref 6–20)
Potassium: 4.9 mmol/L (ref 3.3–5.1)
Sodium: 142 mmol/L (ref 133–145)

## 2020-12-23 LAB — HEMOGLOBIN A1C: Hemoglobin A1C: 6.3 % — ABNORMAL HIGH

## 2020-12-23 LAB — T4, FREE: Free T4: 1.1 ng/dL (ref 0.9–1.7)

## 2020-12-23 LAB — MICROALBUMIN, URINE, RANDOM
Creatinine,UR: 160 mg/dL (ref 20–300)
Microalb/Creat Ratio: 9.9 mg MA/g CR (ref 0.0–29.9)
Microalbumin,UR: 1.59 mg/dL

## 2020-12-23 LAB — PSA (EFF.4-2010): PSA (eff. 4-2010): 0.11 ng/mL (ref 0.00–4.00)

## 2020-12-23 LAB — TSH: TSH: 5.41 u[IU]/mL — ABNORMAL HIGH (ref 0.27–4.20)

## 2020-12-23 NOTE — Progress Notes (Signed)
Medicare Wellness Visit Note    Visit performed as:             Office Visit, met with patient in person    Today we reviewed and updated Jon [name] smoking status, activities of daily living, depression screen, fall risk, medications and allergies.   I have counseled the patient in the above areas.     Subjective:     Chief Complaint: Jon [name] is a 87 y.o. male here for a/an Subsequent Annual Medicare Visit (SWV/CPE)    In general, Jon [name] rates their overall health as:  good    Patient Care Team:  Karsten Fells, MD as PCP - General (Primary Care)  Corey Skains, MD (Ophthalmology)     Current Outpatient Medications on File Prior to Visit   Medication Sig Dispense Refill    rosuvastatin (CRESTOR) 10 mg tablet TAKE 1 TABLET BY MOUTH EVERY DAY WITH DINNER 90 tablet 3    OZEMPIC, 0.25 OR 0.5 MG/DOSE, pen INJECT 0.5 MG SUBCUTANEOUSLY (UNDER THE SKIN) ONCE WEEKLY 1.5 mL 6    memantine (NAMENDA) 10 MG tablet TAKE 1 TABLET BY MOUTH TWO TIMES DAILY 180 tablet 3    ELIQUIS 5 MG tablet TAKE 1 TABLET BY MOUTH TWO TIMES DAILY 180 tablet 1    insulin pen needle (BD PEN NEEDLE NANO 2ND GEN) 32G X 4 MM Use 1 time per week. 30 each 2    pioglitazone (ACTOS) 15 MG tablet TAKE 1 TABLET BY MOUTH EVERY DAY 30 tablet 6    finasteride (PROSCAR) 5 MG tablet Take 1 tablet (5 mg total) by mouth daily For use by men only. 90 tablet 3    metFORMIN (GLUCOPHAGE) 1000 MG tablet TAKE 1 TABLET BY MOUTH TWO TIMES DAILY WITH MEALS 180 tablet 3    blood glucose monitor (FREESTYLE LITE) kit TEST AS DIRECTED      blood glucose (ONETOUCH VERIO) test strip Test  1 times a day.   Brand name of strips one touch verio 50 strip 6    Lancets Micro Thin 33G MISC Use as directed once daily. Dispense lancets for one touch verio 100 each 5    blood glucose monitor w/Device KIT Test as directed 1 kit 0    Sacubitril-Valsartan (ENTRESTO) 24-26 MG per tablet Take 1 tablet by mouth 2 times daily      Coenzyme Q10 200 MG  capsule Take 200 mg by mouth daily      Omega-3 Fatty Acids (FISH OIL) 1000 MG CAPS capsule Take 1 g by mouth daily      metoprolol (TOPROL-XL) 25 MG 24 hr tablet Take 25 mg by mouth every 12 hours      mirabegron (MYRBETRIQ) 50 MG 24 hr tablet Take 1 tablet (50 mg total) by mouth daily 90 tablet 2    dofetilide (TIKOSYN) 250 MCG capsule Take 250 mcg by mouth 2 times daily      amoxicillin (AMOXIL) 500 MG capsule TAKE 4 CAPSULES BY MOUTH ONE HOUR BEFORE DENTAL APPOINTMENTS  0    lancets (ONETOUCH ULTRASOFT) Use   2  times per day as instructed for blood glucose testing. 100 each 3    Blood Glucose Calibration (OT ULTRA/FASTTK CNTRL SOLN) SOLN As directed 1 each 5    CINNAMON PO Take by mouth      Multiple Vitamins-Minerals (MULTI VITAMIN/MINERALS PO) Take by mouth      cholecalciferol (VITAMIN D) 1000 UNIT tablet Take 2,000 Units by  mouth daily          acetaminophen (TYLENOL) 500 MG tablet Take 500 mg by mouth 2 times daily.       No current facility-administered medications on file prior to visit.     Allergies   Allergen Reactions    Bee Venom Other (See Comments)     Red and swelling    No Known Latex Allergy      Created by Conversion - 0;     Insects [Other] Other (See Comments)     Red and swelling     Patient Active Problem List    Diagnosis Date Noted    CHF (congestive heart failure) 11/20/2016     Priority: High    Paroxysmal atrial fibrillation 11/20/2016     Priority: High    Cardiomyopathy 11/12/2016     Priority: High    Protein S deficiency 11/12/2016     Priority: High    Type 2 diabetes mellitus with other specified complication 40/34/7425     Priority: High     Created by Conversion        Chronic anticoagulation 11/12/2016     Priority: Medium    Vascular dementia 11/12/2016     Priority: Medium    BPH (benign prostatic hyperplasia) 08/07/2011     Priority: Medium    Paget's Disease 09/06/2004     Priority: Medium     Created by Conversion        Lower urinary tract  symptoms (LUTS) 09/08/2014     Priority: Low    Opiate analgesic contract exists 09/09/2013     Priority: Low    Penile lesion 08/12/2012     Priority: Low    Urgency of urination 08/07/2011     Priority: Low    Obesity 09/06/2004     Priority: Low     Created by Conversion        Cognitive decline 11/10/2019    Ascending aorta dilatation 04/08/2018     3.9cm on 11/25/17; needs yearly surveillance      Chronic low back pain 09/23/2017     Past Medical History:   Diagnosis Date    Arthritis     BPH (benign prostatic hypertrophy)     CHF (congestive heart failure)     Clotting disorder     Dementia     mild    Diabetes mellitus     Paget's disease of bone     Palpitations     Protein S deficiency     On chronic anticoagulation    Sinus bradycardia     Varicella      Past Surgical History:   Procedure Laterality Date    APPENDECTOMY      CHOLECYSTECTOMY      EYE SURGERY      GALLBLADDER SURGERY      JOINT REPLACEMENT      KNEE REPLACEMENT      SKIN GRAFT      TONSILLECTOMY       Family History   Problem Relation Age of Onset    BRCA 1/2 Brother     Arthritis Mother     Heart Disease Mother     High Blood Pressure Mother     Heart Disease Father     Stroke Father     Depression Other     Diabetes Other     Heart failure Other     Heart Disease Other     High  Blood Pressure Other     Kidney Disease Other      Social History     Socioeconomic History    Marital status: Married     Spouse name: Not on file    Number of children: Not on file    Years of education: Not on file    Highest education level: Not on file   Occupational History    Occupation: Former Personal assistant, used to work night shift   Tobacco Use    Smoking status: Never Smoker    Smokeless tobacco: Never Used   Substance and Sexual Activity    Alcohol use: No    Drug use: No    Sexual activity: Never   Social History Narrative    2 daughters with neurological issues       Objective:     Vital Signs: Ht 1.829 m (6'  0.01")    Wt 88 kg (194 lb 1.6 oz)    BMI 26.32 kg/m    BMI: Body mass index is 26.32 kg/m.    Vision Screening Results (Welcome visit only):  No exam data present    Depression Screening Results:  Recent Review Flowsheet Data     PHQ Scores 01/09/2019 12/23/2017 11/07/2016    PSQ2 Q1 - Interest/Pleasure N N N    PSQ2 Q2 - Down, Depressed, Hopeless N N N        Opioid Use/DAST- 10 Screening Results:   How many times in the past year have you used an illegal drug or used a prescription medication for nonmedical reasons?: 0 (12/23/2020 10:51 AM)    Activities of Daily Living/Functional Screening Results:  No data recorded    Fall Risk Screening Results:  Have you fallen in the last year?: No  Do you feel you are at risk for falling?: No      Assessment and Plan:     Cognitive Function:  Recall of recent and remote events appears:  Normal      Advanced Care Planning:  was discussed and the paperwork can be found in the scanned media section     The following health maintenance plan was reviewed with the patient:    Health Maintenance Topics with due status: Overdue       Topic Date Due    IMM-ZOSTER Never done    DEPRESSION SCREEN YEARLY 01/10/2020    Diabetic A1C Monitoring Other 05/19/2020    IMM-INFLUENZA 08/17/2020    COVID-19 Vaccine 09/13/2020     Health Maintenance Topics with due status: Postponed       Topic Postponed Until    HIV Screening USPSTF/[place] 10/22/2029 (Originally 03/09/1947)     Health Maintenance Topics with due status: Not Due       Topic Last Completion Date    Diabetic Foot Exam ADA 06/03/2020    Diabetic Nephropathy Screening on ACE/ARB 06/03/2020    Diabetic Eye Exam ADA 06/15/2020    Fall Risk Screening 06/27/2020     Health Maintenance Topics with due status: Completed       Topic Last Completion Date    IMM-PNEUMOVAX VACCINE 65 + YRS 08/12/2009    IMM-PREVNAR VACCINE 65 + YRS 05/18/2016     This health maintenance schedule, identified risks, a list of orders placed today and patient goals have  been provided to Jon [name] in the after visit summary.     Plan for any concerns identified during screening or risk assessments:  Due shingrix  Onia Shiflett, MD, MPH  [facility]

## 2020-12-23 NOTE — Patient Instructions (Signed)
Thank you for completing your Subsequent Annual Medicare Visit (SWV/CPE)   with [place] today.     The purpose of this visits was to:     Screen for disease   Assess risk of future medical problems   Help develop a healthy lifestyle   Update vaccines   Get to know your doctor in case of an illness    Patient Care Team:  Karsten Fells, MD as PCP - General (Primary Care)  Corey Skains, MD (Ophthalmology)     Medicare 5 Year Plan    The following items were identified as areas of concern during your screening today:  Diabetes - This is a risk factor for Heart Attack, Stroke, Kidney Problems, Eye Problems and a loss of feeling in your fingers and feet.   BMI greater than 25 - This is a risk for Heart Attack, Stroke, High Blood Pressure, Diabetes, High Cholesterol and other complications.       The Health Maintenance table below identifies screening tests and immunizations recommended by your health care team:  Health Maintenance: These screening recommendations are based on USPSTF, [employer], and [place] state guidelines   Topic Date Due    Shingles Vaccine (1 of 2) Never done    DEPRESSION SCREEN YEARLY  01/10/2020    Diabetic A1C Monitoring  05/19/2020    Flu Shot (1) 08/17/2020    COVID-19 Vaccine (3 - Booster for Pfizer series) 09/13/2020    HIV Screening  10/22/2029 (Originally 03/09/1947)    Diabetic Foot Exam  06/03/2021    Diabetic Kidney Disease  06/03/2021    Fall Risk Screening  12/23/2021    Diabetic Eye Exam  06/15/2022    Adult Prevnar Vaccination  Completed    Adult Pneumovax Vaccine  Completed     In addition, goals and orders placed to address these recommendations are listed in the "Today's Visit" section.    We wish you the best of health and look forward to seeing you again next year for your Annual Medicare Wellness Visit.     If you have any health care concerns before then, please do not hesitate to contact us.

## 2020-12-26 ENCOUNTER — Ambulatory Visit: Payer: Medicare (Managed Care) | Admitting: Endocrinology

## 2020-12-26 ENCOUNTER — Encounter: Payer: Self-pay | Admitting: Endocrinology

## 2020-12-26 ENCOUNTER — Encounter: Payer: Self-pay | Admitting: Primary Care

## 2020-12-26 VITALS — BP 117/58 | HR 73 | Ht 72.0 in | Wt 195.1 lb

## 2020-12-26 DIAGNOSIS — E119 Type 2 diabetes mellitus without complications: Secondary | ICD-10-CM

## 2020-12-26 DIAGNOSIS — E1165 Type 2 diabetes mellitus with hyperglycemia: Secondary | ICD-10-CM

## 2020-12-26 DIAGNOSIS — IMO0002 Reserved for concepts with insufficient information to code with codable children: Secondary | ICD-10-CM

## 2020-12-26 MED ORDER — METFORMIN HCL 1000 MG PO TABS *I*
1000.0000 mg | ORAL_TABLET | Freq: Two times a day (BID) | ORAL | 3 refills | Status: DC
Start: 2020-12-26 — End: 2021-01-10

## 2020-12-26 MED ORDER — PIOGLITAZONE HCL 15 MG PO TABS *I*
15.0000 mg | ORAL_TABLET | Freq: Every day | ORAL | 3 refills | Status: DC
Start: 2020-12-26 — End: 2021-12-21

## 2020-12-26 NOTE — Progress Notes (Signed)
Subjective:      Jon [name] is a 87 y.o. male who presents for follow up of type 2 diabetes.        INTERVAL HISTORY:  Doing well  --Treatment history:  --Current management:  Current diabetic medications include:  Ozempic 0.5 mg weekly, Metformin 1 gm bid and Actos 15 mg daily   -- Self-glucose monitoring: 3-4 times/week.  States <130 mg/dL  -- Hyperglycemia: denies  -- Hypoglycemia: denies confusion, dizziness, headache, seizures, sweating, nightmares and LOC.  Hypoglycemia unawareness: no  -- Endocrine: denies polyuria, polydipsia.  -- CV: denies chest pain, shortness of breath.  --Extremities:     -- Hands: denies tingling,numbness, pain     -- Feet: denies tingling, numbness, pain, foot ulcers  -- Eyes: denies history of laser treatment, double/blurry vision, retinopathy, cataracts, glaucoma, Last eye exam:  yearly.  -- Kidney: denies history of nephropathy.      Patient's medications, allergies, past medical, surgical, social and family histories were reviewed and updated as appropriate.      Review of Systems  CONSTITUTIONAL:  Appetite good, no fevers, night sweats or weight loss  HEAD: No headache, dizziness, or syncope  EYES:No vision changes or eye pain  CV: No chest pain, shortness of breath, or edema.  RESPIRATORY:  No cough, wheezing, or dyspnea  GI:  No nausea, vomiting, abdominal pain, or change in bowel habits  NEURO:  No mental status changes, motor weakness, or sensory changes  MS:  No joint pain, swelling, or musculoskeletal deformities  ENDOCRINE:  No polyuria, polydipsia, or heat intolerance.       Objective:     BP 117/58    Pulse 73    Ht 1.829 m (6')    Wt 88.5 kg (195 lb 1.6 oz)    BMI 26.46 kg/m   GENERAL APPEARANCE: well developed, well nourished, no acute distress; aware and alert  HEENT: PERLA, EOMI, no lid lag, pink conjunctiva ; no proptosis   NECK: supple, no thyromegaly, no lymphadenopathy  HEART: RRR, normal S1, S2  CHEST: lungs clear to auscultation  bilaterally  EXTREMITIES: no clubbing, cyanosis, or edema. Pedal pulses 2+ bilaterally. Both feet clean without deformity, callous, ulceration, or fungal infection.  NEUROLOGICAL: Alert and oriented x 3. Not sensitive to monofilament.         Lab Review  Lab Results   Component Value Date/Time    NA 142 12/23/2020 01:43 PM    K 4.9 12/23/2020 01:43 PM    CL 106 12/23/2020 01:43 PM    UN 25 (H) 12/23/2020 01:43 PM    CREAT 1.17 12/23/2020 01:43 PM    MALBR 1.59 12/23/2020 01:43 PM     Lab Results   Component Value Date/Time    CHOL 137 12/23/2020 01:43 PM    TRIG 78 12/23/2020 01:43 PM    HDL 51 12/23/2020 01:43 PM    LDLC 70 12/23/2020 01:43 PM     Lab Results   Component Value Date/Time    TSH 5.41 (H) 12/23/2020 01:43 PM     Results for Jon, [name] (MRN [id number]) as of 12/26/2020 10:48   Ref. Range 12/23/2018 08:30 07/06/2019 09:05 11/09/2019 09:19 02/17/2020 10:44 12/23/2020 13:43   Glucose Latest Ref Range: 60 - 99 mg/dL 199 (H) 245 (H) 203 (H)  115 (H)   Hemoglobin A1C Latest Units: % 9.0 (H) 8.4 (H) 9.2 (H) 7.1 (H) 6.3 (H)             Assessment:  Jon [name] is a 87 y.o. male with Uncontrolled type 2 diabetes mellitus.  A1c at target with no hypoglycemia.  He has no other complaints today.  TSH slightly elevated but will not treat at this time since <10.          Plan:     TESTS:  A1c and TFT's before next appointment          TREATMENT:  Continue Metformin 1 gm bid, Actos 15 mg daily and Ozempic 0.5 mg weekly      RETURN IN:  Six months    Thank you for allowing me to participate in the care of this patient.  Please feel free to contact me if you have any questions or concerns.

## 2020-12-26 NOTE — Progress Notes (Signed)
[facility]  Physical Exam Visit    Jon [name] is 87 y.o. male presenting for a full physical exam.     Specific Concern's today also include:    Bilateral knee pain    Hypertension  Adherent to medications  ~~~    Patient has not been hospitalized in the past year.    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    Type 2 diabetes mellitus with other specified complication B51.02    Obesity E66.9    Urgency of urination R39.15    BPH (benign prostatic hyperplasia) N40.0    Penile lesion N48.9    Opiate analgesic contract exists Z79.891    Lower urinary tract symptoms (LUTS) R39.9    CHF (congestive heart failure) I50.9    Paroxysmal atrial fibrillation I48.0    Cardiomyopathy I42.9    Chronic anticoagulation Z79.01    Protein S deficiency D68.59    Vascular dementia F01.50    Chronic low back pain M54.50, G89.29    Ascending aorta dilatation I77.810    Cognitive decline R41.89       Current Outpatient Medications   Medication Sig    rosuvastatin (CRESTOR) 10 mg tablet TAKE 1 TABLET BY MOUTH EVERY DAY WITH DINNER    OZEMPIC, 0.25 OR 0.5 MG/DOSE, pen INJECT 0.5 MG SUBCUTANEOUSLY (UNDER THE SKIN) ONCE WEEKLY    memantine (NAMENDA) 10 MG tablet TAKE 1 TABLET BY MOUTH TWO TIMES DAILY    ELIQUIS 5 MG tablet TAKE 1 TABLET BY MOUTH TWO TIMES DAILY    insulin pen needle (BD PEN NEEDLE NANO 2ND GEN) 32G X 4 MM Use 1 time per week.    finasteride (PROSCAR) 5 MG tablet Take 1 tablet (5 mg total) by mouth daily For use by men only.    blood glucose monitor (FREESTYLE LITE) kit TEST AS DIRECTED    blood glucose (ONETOUCH VERIO) test strip Test  1 times a day.   Brand name of  strips one touch verio    Lancets Micro Thin 33G MISC Use as directed once daily. Dispense lancets for one touch verio    blood glucose monitor w/Device KIT Test as directed    Sacubitril-Valsartan (ENTRESTO) 24-26 MG per tablet Take 1 tablet by mouth 2 times daily    Coenzyme Q10 200 MG capsule Take 200 mg by mouth daily    Omega-3 Fatty Acids (FISH OIL) 1000 MG CAPS capsule Take 1 g by mouth daily    metoprolol (TOPROL-XL) 25 MG 24 hr tablet Take 25 mg by mouth every 12 hours    mirabegron (MYRBETRIQ) 50 MG 24 hr tablet Take 1 tablet (50 mg total) by mouth daily    dofetilide (TIKOSYN) 250 MCG capsule Take 250 mcg by mouth 2 times daily    amoxicillin (AMOXIL) 500 MG capsule TAKE 4 CAPSULES BY MOUTH ONE HOUR BEFORE DENTAL APPOINTMENTS    lancets (ONETOUCH ULTRASOFT) Use   2  times per day as instructed for blood glucose testing.    Blood Glucose Calibration (OT ULTRA/FASTTK CNTRL SOLN) SOLN As directed  CINNAMON PO Take by mouth    Multiple Vitamins-Minerals (MULTI VITAMIN/MINERALS PO) Take by mouth    cholecalciferol (VITAMIN D) 1000 UNIT tablet Take 2,000 Units by mouth daily        acetaminophen (TYLENOL) 500 MG tablet Take 500 mg by mouth 2 times daily.    pioglitazone (ACTOS) 15 mg tablet Take 1 tablet (15 mg total) by mouth daily    metFORMIN (GLUCOPHAGE) 1,000 mg tablet Take 1 tablet (1,000 mg total) by mouth 2 times daily (with meals)       Allergies   Allergen Reactions    Bee Venom Other (See Comments)     Red and swelling    No Known Latex Allergy      Created by Conversion - 0;     Insects [Other] Other (See Comments)     Red and swelling       Past Medical History:   Diagnosis Date    Arthritis     BPH (benign prostatic hypertrophy)     CHF (congestive heart failure)     Clotting disorder     Dementia     mild    Diabetes mellitus     Paget's disease of bone     Palpitations     Protein S deficiency     On chronic anticoagulation    Sinus bradycardia     Varicella         Past Surgical History:   Procedure Laterality Date    APPENDECTOMY      CHOLECYSTECTOMY      EYE SURGERY      GALLBLADDER SURGERY      JOINT REPLACEMENT      KNEE REPLACEMENT      SKIN GRAFT      TONSILLECTOMY         Social History     Socioeconomic History    Marital status: Married     Spouse name: Not on file    Number of children: Not on file    Years of education: Not on file    Highest education level: Not on file   Occupational History    Occupation: Former Personal assistant, used to work night shift   Tobacco Use    Smoking status: Never Smoker    Smokeless tobacco: Never Used   Substance and Sexual Activity    Alcohol use: No    Drug use: No    Sexual activity: Never   Social History Narrative    2 daughters with neurological issues         Family History   Problem Relation Age of Onset    BRCA 1/2 Brother     Arthritis Mother     Heart Disease Mother     High Blood Pressure Mother     Heart Disease Father     Stroke Father     Depression Other     Diabetes Other     Heart failure Other     Heart Disease Other     High Blood Pressure Other     Kidney Disease Other             Domestic Violence Screening   Negative  '[x]'      Positive - Intimate Partner  '[]'  Other  '[]'  Remote Hx  '[]'    Depression Screening   PHQ - 9 Score  =    PHQ2  PHQ 01/09/2019   Over the last two weeks, have you often been bothered  by feeling down, depressed or hopeless? N   Over the last two weeks, have you often had little interest or pleasure in doing things? N        N/A - not indicated  '[]'        Aging Concerns   Trouble Hearing - No  '[x]'  Yes  '[]'  Details:   Memory Concerns - No  '[x]'  Yes  '[]'  Details:   Fall in past year - No  '[x]'  Yes  '[]'  Details:   Worsening Balance - No  '[x]'  Yes  '[]'  Details:   Environmental Safety   - Discussed sunscreen, seatbelts, cycling helmet, smoke detectors, CO detectors, firearms  Notes:        Health Maintenance Topics with due status: Overdue       Topic Date Due    IMM-ZOSTER Never  done    DEPRESSION SCREEN YEARLY 01/10/2020    COVID-19 Vaccine 09/13/2020     Health Maintenance Topics with due status: Postponed       Topic Postponed Until    HIV Screening USPSTF/[place] 10/22/2029 (Originally 03/09/1947)     Health Maintenance Topics with due status: Not Due       Topic Last Completion Date    Diabetic Foot Exam ADA 06/03/2020    Diabetic Eye Exam ADA 06/15/2020    Diabetic Nephropathy Screening on ACE/ARB 12/23/2020    Diabetic A1C Monitoring Other 12/23/2020    Fall Risk Screening 12/26/2020     Health Maintenance Topics with due status: Completed       Topic Last Completion Date    IMM-PNEUMOVAX VACCINE 65 + YRS 08/12/2009    IMM-PREVNAR VACCINE 65 + YRS 05/18/2016    IMM-INFLUENZA 12/23/2020       PHYSICAL EXAM   BP 108/66    Pulse 70    Ht 1.829 m (6' 0.01")    Wt 88 kg (194 lb 1.6 oz)    BMI 26.32 kg/m     GEN: Alert, pleasant well adult in NAD.   HEENT: Normocephalic and atraumatic.PERRL. Moist mucous membranes. TM's clear BL  NECK: Supple, no lymphadenopathy or thyromegaly   PULM: Easy respirations, well aerated, CTA bilaterally.   CVS: RRR, no murmur, normal S1& S2. Equal and strong radial pulses.   ABD:  soft, nontender, nondistended, no hepatosplenomegaly, no masses.   SKIN: No rashes or concerning lesions    NEURO: steady gait  Ext: no clubbing, edema or cyanosis       Assessment/Plan   1. Complete Physical Exam    2. Type 2 diabetes mellitus with other specified complication, without long-term current use of insulin  A1 at goal, continue current medications    3. Immunization due  - Flu quad high dose >65 YO (high dose)    4. Essential hypertension, benign  At goal, continue current medications    5. Pure hypercholesterolemia  Rosuvastatin 47m daily    6. Bilateral knee osteoarthritis  Declines surgery  Discussed physical activity, PT    7. Atrial fibrillation   Continue metoprolol XL 29mdaily, eliquis 53m47mID   Care Planning    Health Care Proxy:   Paperwork as part of Record? '[x]'    Yes   '[]'    No  '[]'   Awaiting return of forms    Discussed Relevant Health Education Topics Listed Below:   x Importance of recommended health screenings and timing of stopping    x Importance of recommended vaccines     PSA/Prostate Cancer  Appropriateness of ASA for primary prevention     Bone Health/Calcium and Vitamin D   x Appropriate Weight/Weight loss goals   x Regular Exercise    Smoking Cessation   x Healthcare proxy    x Living Will    Code status    Stress/Family issue    Caregiver Stress     Domestic Violence     Return in about 6 months (around 06/22/2021) for Follow-up Hypertension.    Georgiann Mccoy, MD, MPH  [facility]

## 2020-12-28 ENCOUNTER — Other Ambulatory Visit: Payer: Self-pay | Admitting: Psychiatry

## 2020-12-28 DIAGNOSIS — IMO0002 Reserved for concepts with insufficient information to code with codable children: Secondary | ICD-10-CM

## 2021-01-08 ENCOUNTER — Other Ambulatory Visit: Payer: Self-pay | Admitting: Primary Care

## 2021-01-08 DIAGNOSIS — E119 Type 2 diabetes mellitus without complications: Secondary | ICD-10-CM

## 2021-01-09 NOTE — Telephone Encounter (Signed)
LOV 12/23/20. NOV 06/23/21

## 2021-01-10 ENCOUNTER — Other Ambulatory Visit: Payer: Self-pay | Admitting: Urology

## 2021-01-10 DIAGNOSIS — R3989 Other symptoms and signs involving the genitourinary system: Secondary | ICD-10-CM

## 2021-01-11 ENCOUNTER — Ambulatory Visit: Payer: Medicare (Managed Care) | Admitting: Urology

## 2021-01-11 ENCOUNTER — Encounter: Payer: Self-pay | Admitting: Urology

## 2021-01-11 ENCOUNTER — Other Ambulatory Visit: Payer: Self-pay | Admitting: Endocrinology

## 2021-01-11 VITALS — Ht 72.0 in | Wt 195.0 lb

## 2021-01-11 DIAGNOSIS — R3989 Other symptoms and signs involving the genitourinary system: Secondary | ICD-10-CM

## 2021-01-11 DIAGNOSIS — R350 Frequency of micturition: Secondary | ICD-10-CM

## 2021-01-11 DIAGNOSIS — R3915 Urgency of urination: Secondary | ICD-10-CM

## 2021-01-11 LAB — POCT BLADDER SCAN PVR: Residual mL: 96

## 2021-01-11 LAB — POCT URINALYSIS DIPSTICK
Blood,UA POCT: NEGATIVE
Glucose,UA POCT: NORMAL mg/dL
Ketones,UA POCT: NEGATIVE mg/dL
Leuk Esterase,UA POCT: NEGATIVE
Lot #: 54510303
Nitrite,UA POCT: NEGATIVE
PH,UA POCT: 5 (ref 5–8)
Protein,UA POCT: NEGATIVE mg/dL
Specific gravity,UA POCT: 1.02 (ref 1.002–1.030)

## 2021-01-11 NOTE — Progress Notes (Signed)
[facility] Urology Follow Up Visit    Jon [name]  01/11/2021    Chief complaint : bph f/u  Pt is accompanied by his wife  HPI: Patient had telemed appt in 2021. States his LUTs improved significantly with tighter glucose control. HA1c down to 6.3.  States leaking less and urgency and frequency have improved.  Endorses he has more good days than bad days. He continues on myrbetriq and considering going off.   Previously had a rash on the head of his uncircumcised penis which has now completely resolved.  He retract the foreskin to wash 2 times per week. No other complaints.     Changes since prior visit :   PMH -   Past Medical History:   Diagnosis Date    Arthritis     BPH (benign prostatic hypertrophy)     CHF (congestive heart failure)     Clotting disorder     Dementia     mild    Diabetes mellitus     Paget's disease of bone     Palpitations     Protein S deficiency     On chronic anticoagulation    Sinus bradycardia     Varicella      PSH -   Past Surgical History:   Procedure Laterality Date    APPENDECTOMY      CHOLECYSTECTOMY      EYE SURGERY      GALLBLADDER SURGERY      JOINT REPLACEMENT      KNEE REPLACEMENT      SKIN GRAFT      TONSILLECTOMY       Medications -   Current Outpatient Medications   Medication Sig    metFORMIN (GLUCOPHAGE) 1,000 mg tablet TAKE 1 TABLET BY MOUTH TWO TIMES DAILY WITH MEALS    pioglitazone (ACTOS) 15 mg tablet Take 1 tablet (15 mg total) by mouth daily    rosuvastatin (CRESTOR) 10 mg tablet TAKE 1 TABLET BY MOUTH EVERY DAY WITH DINNER    OZEMPIC, 0.25 OR 0.5 MG/DOSE, pen INJECT 0.5 MG SUBCUTANEOUSLY (UNDER THE SKIN) ONCE WEEKLY    memantine (NAMENDA) 10 MG tablet TAKE 1 TABLET BY MOUTH TWO TIMES DAILY    ELIQUIS 5 MG tablet TAKE 1 TABLET BY MOUTH TWO TIMES DAILY    insulin pen needle (BD PEN NEEDLE NANO 2ND GEN) 32G X 4 MM Use 1 time per week.    finasteride (PROSCAR) 5 MG tablet Take 1 tablet (5 mg total) by mouth daily For use by men only.     blood glucose monitor (FREESTYLE LITE) kit TEST AS DIRECTED    blood glucose (ONETOUCH VERIO) test strip Test  1 times a day.   Brand name of strips one touch verio    Lancets Micro Thin 33G MISC Use as directed once daily. Dispense lancets for one touch verio    blood glucose monitor w/Device KIT Test as directed    Sacubitril-Valsartan (ENTRESTO) 24-26 MG per tablet Take 1 tablet by mouth 2 times daily    Coenzyme Q10 200 MG capsule Take 200 mg by mouth daily    Omega-3 Fatty Acids (FISH OIL) 1000 MG CAPS capsule Take 1 g by mouth daily    metoprolol (TOPROL-XL) 25 MG 24 hr tablet Take 25 mg by mouth every 12 hours    mirabegron (MYRBETRIQ) 50 MG 24 hr tablet Take 1 tablet (50 mg total) by mouth daily    dofetilide (TIKOSYN) 250 MCG capsule Take  250 mcg by mouth 2 times daily    lancets (ONETOUCH ULTRASOFT) Use   2  times per day as instructed for blood glucose testing.    Blood Glucose Calibration (OT ULTRA/FASTTK CNTRL SOLN) SOLN As directed    CINNAMON PO Take by mouth    Multiple Vitamins-Minerals (MULTI VITAMIN/MINERALS PO) Take by mouth    cholecalciferol (VITAMIN D) 1000 UNIT tablet Take 2,000 Units by mouth daily        amoxicillin (AMOXIL) 500 MG capsule TAKE 4 CAPSULES BY MOUTH ONE HOUR BEFORE DENTAL APPOINTMENTS    acetaminophen (TYLENOL) 500 MG tablet Take 500 mg by mouth 2 times daily.     Allergies - no change  ROS - no change    Physical Exam :   Ht 1.829 m (6')    Wt 88.5 kg (195 lb)    BMI 26.45 kg/m   General appearance - well appearing, nad  Back - no cvat bilaterally  Abdomen - soft, no masses, no hepatosplenomegaly, non-distended bladder, no inguinal hernia, no tenderness  Lymph nodes - no lad  Extremities/pulses -nl  Genitourinary - Male -  Penis -  normal, uncircumcized and mild papular erythema at corona of penis and head of the glans   Urethral Meatus -  Normal  Scrotum -  Normal  Right and left testes - Normal    Recent Results (from the past 24 hour(s))   POCT  urinalysis dipstick    Collection Time: 01/11/21  9:43 AM   Result Value Ref Range    Specific gravity,UA POCT 1.020 1.002 - 1.030    PH,UA POCT 5.0 5 - 8    Leuk Esterase,UA POCT Negative Negative    Nitrite,UA POCT Negative Negative    Protein,UA POCT Negative Negative mg/dL    Glucose,UA POCT Normal Normal mg/dL    Ketones,UA POCT Negative Negative mg/dL    Urobilinogen,UA Test Not Performed Less than 1 mg/dL    Bilirubin,Ur Test Not Performed Negative, Test Not Performed    Blood,UA POCT Negative Negative    Exp date 10/16/2021     Lot # [id number]    POCT Bladder Scan PVR    Collection Time: 01/11/21  9:50 AM   Result Value Ref Range    Residual mL 96 ml      Diagnosis established -BPH with LUTS,  pt's memory issues and h/o vascular dementia currently on every day myrbetriq; LUTs improved with lower HA1c    Plan of care -  He will try stopping myrbetriq  FU one year

## 2021-01-18 ENCOUNTER — Other Ambulatory Visit: Payer: Self-pay | Admitting: Urology

## 2021-02-01 ENCOUNTER — Other Ambulatory Visit: Payer: Self-pay | Admitting: Primary Care

## 2021-02-01 NOTE — Telephone Encounter (Signed)
LOV 12/23/20. NOV 06/23/21

## 2021-02-16 ENCOUNTER — Other Ambulatory Visit: Payer: Self-pay | Admitting: Primary Care

## 2021-02-16 MED ORDER — APIXABAN 5 MG PO TABS *I*
5.0000 mg | ORAL_TABLET | Freq: Two times a day (BID) | ORAL | 1 refills | Status: DC
Start: 2021-02-16 — End: 2021-08-11

## 2021-02-16 NOTE — Telephone Encounter (Signed)
LOV 12/23/20  NPV 06/23/21

## 2021-03-17 ENCOUNTER — Encounter: Payer: Self-pay | Admitting: Gastroenterology

## 2021-04-24 ENCOUNTER — Encounter: Payer: Self-pay | Admitting: Family Medicine

## 2021-04-24 ENCOUNTER — Ambulatory Visit: Payer: Medicare (Managed Care) | Admitting: Family Medicine

## 2021-04-24 VITALS — BP 122/68 | HR 80 | Ht 72.01 in | Wt 197.4 lb

## 2021-04-24 DIAGNOSIS — R2681 Unsteadiness on feet: Secondary | ICD-10-CM

## 2021-04-24 DIAGNOSIS — W101XXA Fall (on)(from) sidewalk curb, initial encounter: Secondary | ICD-10-CM

## 2021-04-24 DIAGNOSIS — Y9353 Activity, golf: Secondary | ICD-10-CM

## 2021-04-24 DIAGNOSIS — W19XXXA Unspecified fall, initial encounter: Secondary | ICD-10-CM

## 2021-04-24 MED ORDER — GENERIC DME *A*
0 refills | Status: AC
Start: 2021-04-24 — End: ?

## 2021-04-24 NOTE — Progress Notes (Signed)
[facility]  Outpatient Progress Note     Date:  04/24/2021  Name:  Jon [name]   MRN:  [id number]  DOB:  06/10/1934   Assessment and Plan     Jon [name] is a 87 y.o. male presenting with c/o fall.    1. Fall, initial encounter  Reassuring exam today. I do not believe he needs any imaging of his head as the fall was 4 days ago and he has a normal neurologic exam. I also did not recommend XR of his ribs - he has no tenderness or bruising to suggest fracture. Could consider if pain not improving but likely muscle strain that will resolve with rest and avoidance of triggering activities (discussed rotational golf swing will likely trigger pain, recommend taking a break from golf until feeling better).   Encouraged patient in the future to seek care at the ER if he falls and hits head due to higher risk of bleeding on eliquis. Or call our office and we could direct him accordingly. He voiced understanding.  Advised to stop motrin, do not use this or other NSAIDs in combination with blood thinner.   Patient would like his own cane, given paper script today. He prefers the single point cane to the 4 point cane.   Counseled on warning signs including new/worsening headache, vision changes, lightheadedness, presyncope or syncope, worsening pain, other concerns. Call our office with any questions, concerns.   - generic DME; Dispense 1 single point cane. Use as directed.  Dispense: 1 each; Refill: 0        F/U: Follow up if symptoms worsen or fail to improve.     SUBJECTIVE     Chief Complaint   Patient presents with    Follow-up     fall on 05/05     Here today with wife and daughter.   Patient states he did not feel he needed to come in but his family wanted him to be evaluated.     Fall   - occurred 5/5  - was playing golf   - tripped over patio bricks   - fell on left side, hit hands and head  - has some scrapes on b/l hands, left side of face   - denies LOC  - current symptoms: pain in left  side along ribs (feels like pulled muscle), feels knees are weak   - denies focal weakness, numbness, tingling, other concerns     Has been using wife's cane since fall     Would like to get back to golfing     Patient is anticoagulated on eliquis  States he has also been using motrin prn for pain since the fall.     ROS   Per above.     I have reviewed the patient's past medical, surgical, family and medication histories and made appropriate corrections and updates in their respective parts of this chart.     Objective     Vitals:    04/24/21 1628   BP: 122/68   Pulse: 80   Weight: 89.5 kg (197 lb 6.4 oz)   Height: 1.829 m (6' 0.01")    Wt Readings from Last 3 Encounters:   04/24/21 89.5 kg (197 lb 6.4 oz)   01/11/21 88.5 kg (195 lb)   12/26/20 88.5 kg (195 lb 1.6 oz)    BP Readings from Last 3 Encounters:   04/24/21 122/68   12/26/20 117/58   12/23/20 108/66  Physical Exam  General appearance: Well appearing, in NAD.  HEENT: PERRL, EOMI, superficial abrasions noted on left cheek of face otherwise NCAT  Cardiovascular:  Warm and well perfused.  Pulmonary:  Non-labored breathing.  Abd: Abdomen is soft, NTND.   Skin: Warm and dry. No bruising along area of pain on left rib cage or side.   MSK: No TTP over left rib cage.   Neuro:  Alert and oriented. CN II-XII intact. Ambulates well with cane.     Labs:  Lab Results   Component Value Date    NA 142 12/23/2020    K 4.9 12/23/2020    CL 106 12/23/2020    CO2 26 12/23/2020    UN 25 (H) 12/23/2020    CREAT 1.17 12/23/2020    VID25 42 11/09/2019    VB12 407 06/20/2016    WBC 6.2 11/09/2019    HGB 12.4 (L) 11/09/2019    HCT 40 11/09/2019    PLT 164 11/09/2019    TSH 5.41 (H) 12/23/2020    HA1C 6.3 (H) 12/23/2020    CHOL 137 12/23/2020    TRIG 78 12/23/2020    HDL 51 12/23/2020    LDLC 70 12/23/2020    [place] 2.7 12/23/2020       Jon Fantasia, MD  Family Medicine  04/26/21   11:16 AM

## 2021-05-16 ENCOUNTER — Telehealth: Payer: Self-pay | Admitting: Primary Care

## 2021-05-16 NOTE — Telephone Encounter (Signed)
Recommend appointment first, can determine best imaging needed, if any.     Georgiann Mccoy, MD, MPH  [facility]

## 2021-05-16 NOTE — Telephone Encounter (Signed)
His daughter,, Rosaria Ferries - she states that her father fell and bumped into a wall either yesterday or the day before. (she just got back into to town). He is complaining of right shoulder discomfort. She wanted advise. [phone]

## 2021-05-16 NOTE — Telephone Encounter (Signed)
Called and spoke with pts wife who reports that pt fell several weeks ago and his left side is still bothering him but now his right shoulder is bothering him when he lifts it up past a certain point. They are wondering about imaging for both the left ribs and the right shoulder.

## 2021-05-17 ENCOUNTER — Ambulatory Visit
Admission: AD | Admit: 2021-05-17 | Discharge: 2021-05-17 | Disposition: A | Discharge status: Home / Self Care | Payer: Medicare (Managed Care) | Source: Ambulatory Visit | Attending: Emergency Medicine | Admitting: Emergency Medicine

## 2021-05-17 ENCOUNTER — Ambulatory Visit
Admit: 2021-05-17 | Discharge: 2021-05-17 | Disposition: A | Discharge status: Home / Self Care | Payer: Medicare (Managed Care) | Admitting: Radiology

## 2021-05-17 DIAGNOSIS — W19XXXA Unspecified fall, initial encounter: Secondary | ICD-10-CM | POA: Insufficient documentation

## 2021-05-17 DIAGNOSIS — M79621 Pain in right upper arm: Secondary | ICD-10-CM

## 2021-05-17 DIAGNOSIS — S4991XA Unspecified injury of right shoulder and upper arm, initial encounter: Secondary | ICD-10-CM | POA: Insufficient documentation

## 2021-05-17 DIAGNOSIS — M25511 Pain in right shoulder: Secondary | ICD-10-CM | POA: Insufficient documentation

## 2021-05-17 DIAGNOSIS — M19011 Primary osteoarthritis, right shoulder: Secondary | ICD-10-CM

## 2021-05-17 DIAGNOSIS — M8588 Other specified disorders of bone density and structure, other site: Secondary | ICD-10-CM

## 2021-05-17 NOTE — Discharge Instructions (Signed)
WHAT IS RICE? The routine care of many injuries includes rest, ice, compression, and elevation (RICE therapy).   Rest: Rest is required to allow your body to heal. Generally, you can resume your routine activities when you are comfortable and have been given permission by your health care provider.  Ice :Icing your injury helps to keep the swelling down and it reduces pain. Do not apply ice directly to your skin.  Put ice in a plastic bag.  Place a towel between your skin and the bag.  Leave the ice on for 20 minutes, 2-3 times per day.  Do this for as long as you are directed by your health care provider.      WHEN SHOULD I SEEK MEDICAL CARE?  You should seek medical care if:  Your pain and swelling continue.  Your symptoms are getting worse rather than improving.  These symptoms may indicate that further evaluation or further X-rays are needed. Sometimes, X-rays may not show a small broken bone (fracture) until a number of days later.   You may use Tylenol 650mg  every 6 hours for pain.  Please take with food to avoid upset stomach, nausea or vomiting.   I

## 2021-05-17 NOTE — Telephone Encounter (Signed)
Called and spoke with pt's wife. Pt will go to an UC to be evaluated in case he needs X-rays.    FYI

## 2021-05-17 NOTE — ED Triage Notes (Signed)
Pt reports that on Monday he lost his balance and tripped at home. He landed on his right side. He did not hit his head, did not lose consciousness. He c/o ongoing right shoulder pain and limited ROM. Has been using Tylenol and Voltaren gel.

## 2021-05-17 NOTE — [facility] Provider Note (Signed)
History     Chief Complaint   Patient presents with    Shoulder Pain     Patient is an 87 year old male that arrives today with right shoulder and upper arm pain.  Patient states he fell on Monday while trying to put his shoes on.  Patient fell onto outstretched hands and did not hit his head and had no loss of consciousness.      History provided by:  Patient  Language interpreter used: No        Medical/Surgical/Family History     Past Medical History:   Diagnosis Date    Arthritis     BPH (benign prostatic hypertrophy)     CHF (congestive heart failure)     Clotting disorder     Dementia     mild    Diabetes mellitus     Paget's disease of bone     Palpitations     Protein S deficiency     On chronic anticoagulation    Sinus bradycardia     Varicella         Patient Active Problem List   Diagnosis Code    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    Type 2 diabetes mellitus with other specified complication R74.08    Obesity E66.9    Urgency of urination R39.15    BPH (benign prostatic hyperplasia) N40.0    Penile lesion N48.9    Opiate analgesic contract exists Z79.891    Lower urinary tract symptoms (LUTS) R39.9    CHF (congestive heart failure) I50.9    Paroxysmal atrial fibrillation I48.0    Cardiomyopathy I42.9    Chronic anticoagulation Z79.01    Protein S deficiency D68.59    Vascular dementia F01.50    Chronic low back pain M54.50, G89.29    Ascending aorta dilatation I77.810    Cognitive decline R41.89            Past Surgical History:   Procedure Laterality Date    APPENDECTOMY      CHOLECYSTECTOMY      EYE SURGERY      GALLBLADDER SURGERY      JOINT REPLACEMENT      KNEE REPLACEMENT      SKIN GRAFT      TONSILLECTOMY       Family History   Problem Relation Age of Onset    BRCA 1/2 Brother     Arthritis Mother     Heart Disease Mother     High Blood Pressure Mother     Heart Disease Father     Stroke Father     Depression Other     Diabetes Other     Heart failure Other      Heart Disease Other     High Blood Pressure Other     Kidney Disease Other           Social History     Tobacco Use    Smoking status: Never Smoker    Smokeless tobacco: Never Used   Substance Use Topics    Alcohol use: No    Drug use: No     Living Situation     Questions Responses    Patient lives with     Homeless     Caregiver for other family member     External Services     Employment     Domestic Violence Risk  Review of Systems   Review of Systems   Constitutional: Negative for activity change, appetite change, chills and fever.   HENT: Negative for congestion, ear pain and sneezing.    Eyes: Negative for photophobia, pain and discharge.   Respiratory: Negative for cough and shortness of breath.    Cardiovascular: Negative for chest pain and palpitations.   Gastrointestinal: Negative for abdominal pain, nausea and vomiting.   Genitourinary: Negative for dysuria and flank pain.   Musculoskeletal: Positive for arthralgias and myalgias. Negative for back pain, gait problem, joint swelling, neck pain and neck stiffness.   Neurological: Negative for facial asymmetry, speech difficulty and headaches.   Psychiatric/Behavioral: Negative for behavioral problems. The patient is not nervous/anxious.        Physical Exam   Triage Vitals      First Recorded BP: 116/59, Resp: 18, Temp: 36.3 C (97.3 F) Oxygen Therapy SpO2: 99 %, Oximetry Source: Rt Hand, O2 Device: None (Room air), Heart Rate: 79, (05/17/21 1352)  .      Physical Exam  Vitals and nursing note reviewed.   Constitutional:       Appearance: Normal appearance.   HENT:      Head: Normocephalic and atraumatic.   Eyes:      General:         Right eye: No discharge.         Left eye: No discharge.      Pupils: Pupils are equal, round, and reactive to light.   Pulmonary:      Effort: No respiratory distress.   Abdominal:      Tenderness: There is no abdominal tenderness.   Musculoskeletal:         General: No deformity.      Right  shoulder: Tenderness and bony tenderness present. No swelling, deformity, effusion, laceration or crepitus. Decreased range of motion. Normal strength. Normal pulse.      Right upper arm: Normal.      Cervical back: Normal range of motion.   Neurological:      Mental Status: He is alert and oriented to person, place, and time.   Psychiatric:         Mood and Affect: Mood normal.         Speech: Speech normal.         Behavior: Behavior normal.          Medical Decision Making        Medical Decision Making  Assessment:    Patient is an 87 year old male that arrives today with right shoulder and upper arm pain.  Patient states he fell on Monday while trying to put his shoes on.  Patient fell onto outstretched hands and did not hit his head and had no loss of consciousness.    Differential diagnosis:    Fracture, dislocation, contusion, sprain, strain    Plan and Results:     Encounter orders    Orders Placed This Encounter      *Shoulder RIGHT standard AP, Grashey, and Lateral views      ED/UC REFERRAL TO PHYSICAL THERAPY    Lab results   No results found for this or any previous visit (from the past 24 hour(s)).    XRay results  *Shoulder RIGHT standard AP, Grashey, and Lateral views    Result Date: 05/17/2021  05/17/2021 2:14 PM RIGHT SHOULDER X-RAYS CLINICAL INFORMATION:  right shoulder and upper arm pain.  Pt fell while putting on shoes.. COMPARISON:  None. PROCEDURE:  Three projections of the right shoulder were obtained. IMPRESSION/FINDINGS:  No evidence of acute osseous abnormality is present. There is diffuse, profound osteopenia. 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 Subcortical sclerosis involving the greater tuberosity of the humerus suggests chronic rotator cuff  pathology. END OF IMPRESSION     Independent Review of: XRays this encounter and chart/prior records    Diagnosis and Disposition:       Return precautions discussed and provided on AVS.    Discharge Medications  Current  Discharge Medication List          Follow-up  Follow-up Information    Call  Karsten Fells, MD.    Specialties: Primary Care, Family Medicine  Why: As needed, If symptoms worsen  Contact information:  [address]  [place] [address]  [phone]                  Patient Instructions  .      WHAT IS RICE? The routine care of many injuries includes rest, ice,   compression, and elevation (RICE therapy).   Rest: Rest is required to allow your body to heal. Generally, you can   resume your routine activities when you are comfortable and have been   given permission by your health care provider.  Ice :Icing your injury helps to keep the swelling down and it reduces   pain. Do not apply ice directly to your skin.  Put ice in a plastic bag.  Place a towel between your skin and the bag.  Leave the ice on for 20 minutes, 2-3 times per day.  Do this for as long as you are directed by your health care provider.      WHEN SHOULD I SEEK MEDICAL CARE?  You should seek medical care if:  Your pain and swelling continue.  Your symptoms are getting worse rather than improving.  These symptoms may indicate that further evaluation or further X-rays are   needed. Sometimes, X-rays may not show a small broken bone (fracture)   until a number of days later.   You may use Tylenol 624m every 6 hours for pain.  Please take with food   to avoid upset stomach, nausea or vomiting.   I              Final Diagnosis  Final diagnoses:   [[Q65.784]Acute pain of right shoulder (Primary)         MDarl Householder NP              WDarl Householder NP  05/17/21 1437

## 2021-05-22 ENCOUNTER — Other Ambulatory Visit: Payer: Self-pay

## 2021-05-23 ENCOUNTER — Ambulatory Visit
Payer: Medicare (Managed Care) | Attending: Emergency Medicine | Admitting: Rehabilitative and Restorative Service Providers"

## 2021-05-23 DIAGNOSIS — S46911D Strain of unspecified muscle, fascia and tendon at shoulder and upper arm level, right arm, subsequent encounter: Secondary | ICD-10-CM | POA: Insufficient documentation

## 2021-05-23 MED ORDER — OZEMPIC (0.25 OR 0.5 MG/DOSE) 2 MG/1.5ML SC SOPN
0.5000 mg | PEN_INJECTOR | SUBCUTANEOUS | 6 refills | Status: DC
Start: 2021-05-23 — End: 2021-11-30

## 2021-05-23 NOTE — Progress Notes (Signed)
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       PMH:    Past Medical History:   Diagnosis Date    Arthritis     BPH (benign prostatic hypertrophy)     CHF (congestive heart failure)     Clotting disorder     Dementia     mild    Diabetes mellitus     Paget's disease of bone     Palpitations     Protein S deficiency     On chronic anticoagulation    Sinus bradycardia     Varicella      Past Surgical History:   Procedure Laterality Date    APPENDECTOMY      CHOLECYSTECTOMY      EYE SURGERY      GALLBLADDER SURGERY      JOINT REPLACEMENT      KNEE REPLACEMENT      SKIN GRAFT      TONSILLECTOMY         * Bold indicates co morbidities effecting treatment and recovery    Personal factors affecting treatment/recovery:   Advanced age   History of falls    Co morbidities affecting treatment/recovery:   Osteoarthritis (OA)   Osteopenia    Clinical presentation:   stable     Patient complexity:   low level as indicated by above stability of condition, personal factors, environmental factors and comorbidities in addition to their impairments found on physical exam.    Subjective     Pain:     0-10 Scale:  0, 5    Pain Location: Shoulder     Pain Orientation: Right     Mechanism of injury/history of symptoms:  Right shoulder strain s/p fall r/o RTC tear. Patient had two falls.    Symptoms worsen with: difficulty with the following: Overhead activity;Reaching across body;Reaching into cupboards;Reaching out to the side;Reaching overhead    Symptoms improve with: Rest, Activity, Medication    Able to do the  following: Static sitting    Objective     Observation: Posture-poor with a forward head and rounded shoulders. Gait- Ambulates with a cane. Recommend 2 canes to walk.    Palpation: Tender to palpation at right shoulder bicipital groove     Sensation: No deficits noted     Coordination: Good     ROM:     ROM     Cervical Spine: Full AROM Cervical Spine   Right Upper Extremity: Full Active RUE except shoulder flexion 90 degrees, ER 50 degrees  left Upper Extremity: Full AROM LUE     Strength:     STRENGTH     Right Upper Extremity: RUE Strength WFLs and able to perform ADL tasks except right shoulder in available ROM 3/5  Left Upper Extremity: LUE Strength WFLs & able to perform ADL tasks     Balance: Fair     Functional outcome measures:     Self-Reported :     Functional Outcome Measure:  Patient Specific Functional Scale     ACTIVITY  0-10 SCALE   (0=unable to perform activity; 10= Able to perform activity at same level as before injury/problem)    Reaching / Overhead ADLs  1                     Endurance: good     Assessment     Jon [name] is a 87 y.o. male who presents to physical therapy with pain, decreased ROM, weakness, and gait deviations secondary to signs and symptoms consistent with Diagnosis: Right shoulder strain s/p fall r/o RTC tear. Skilled physical therapy services indicated to increase function and to address goals below.    Rehab potential/prognosis: good     Patient's understanding: good     Plan     Plan of care: Appropriate for PT     PT interventions: AROM/PROM/Therapeutic exercise, Cold, Flexibility, Heat, Home exercise program instruction, Patient/Family Education, Postural training/body Dealer education, Strengthening     PT frequency: Once a week     PT duration: 4 weeks     Short-term goals ( two weeks)      1. Demonstrate an exercise program   2. Increase right shoulder ROM WNLs    Long-term goals ( four weeks)     1. Independent with a home exercise program   2. Resume reaching  overhead activities with upper extremity   3. Increase strength right shoulder 4+/5     Patient Goals for Therapy: Home exercise program     Thank you for the referral. If you have any questions and/or concerns, please feel free to contact me at [phone].     Lorenza Chick PT, DPT    Department of Physical Medicine & Rehabilitation    PLAN OF CARE    Physician:  Darl Householder, NP  / Karsten Fells, MD    I have reviewed your initial evaluation and agree with the documented goals and Plan of Care      05/23/2021

## 2021-05-23 NOTE — Progress Notes (Signed)
Department of Physical [place]    Physician:  Darl Householder, NP    I have reviewed your progress note and agree with the documented goals and Plan of Care      05/23/2021

## 2021-05-23 NOTE — Progress Notes (Signed)
Physical Therapy Daily Flowsheet:  *Please see Physical Therapy Exercise Flowsheet for details regarding exercises completed this session.*     05/23/21 0700   Overview   Diagnosis Right shoulder pain   Insurance Medicare   Script Date 05/17/21   Visit # 1   Accompanied by Daughter    Additional Comments Checkout completed prior to PT session completed.    Pain Assessment    0-10 Scale 0;5   Pain Location Shoulder   Pain Orientation Right   Modalities   Time MH right shoulder    Patient Education   Educated in disease process Yes   Educated in home exercise program Yes   Time Calculation   PT Timed Codes 8   PT Untimed Codes 25   PT Total Treatment 33       Lorenza Chick PT, DPT

## 2021-05-23 NOTE — Progress Notes (Signed)
Physical Therapy Exercise Flowsheet:  *Please refer to Physical Therapy Daily Flowsheet for further details of this session.*     05/23/21 0700   Shoulder Exercises   Passive Shoulder Flexion, ROM, Supine Comment 1 set, 10x, tol well   Passive Shoulder External Rotation, Rom, Supine Comment 1 set, 10x, tol well   Scapular Retraction, Active 1 set, 10x, tol well   Shoulder Shrugs 1 set, 10x, tol well   Total time 8 minutes       Lorenza Chick PT, DPT

## 2021-05-30 ENCOUNTER — Ambulatory Visit: Payer: Medicare (Managed Care) | Admitting: Rehabilitative and Restorative Service Providers"

## 2021-05-30 DIAGNOSIS — S46911D Strain of unspecified muscle, fascia and tendon at shoulder and upper arm level, right arm, subsequent encounter: Secondary | ICD-10-CM

## 2021-05-30 NOTE — Progress Notes (Signed)
Physical Therapy Daily Flowsheet:  *Please see Physical Therapy Exercise Flowsheet for details regarding exercises completed this session.*     05/30/21 0500   Overview   Diagnosis Right shoulder pain   Insurance Medicare   Script Date 05/17/21   Visit # 2   Additional Comments Checkout completed prior to PT session completed.    Pain Assessment    0-10 Scale 0;3   Pain Location Shoulder   Pain Orientation Right   Modalities   Time Ice post   Time MH right shoulder    Patient Education   Educated in disease process Yes   Educated in home exercise program Yes   Time Calculation   PT Timed Codes 25   PT Untimed Codes 10   PT Total Treatment 35       Lorenza Chick PT, DPT

## 2021-05-30 NOTE — Progress Notes (Signed)
Physical Therapy Exercise Flowsheet:  *Please refer to Physical Therapy Daily Flowsheet for further details of this session.*     05/30/21 1400   Shoulder Exercises   Passive Shoulder Flexion, ROM, Supine Comment 1 set, 10x, tol well   Passive Shoulder External Rotation, Rom, Supine Comment 1 set, 10x, tol well   Scapular Retraction, Active 1 set, 10x, tol well   Shoulder Shrugs 1 set, 10x, tol well   Shoulder External Rotation, Theraband Comment level 2 1 set, 10x, tol well   Shoulder Flexion, Theraband Comment level 2 1 set, 10x, tol well   Shoulder Extension, Theraband Comment level 2 1 set, 10x, tol well   Shoulder Internal Rotation, Theraband Comment level 2 1 set, 10x, tol well   Total time 25 mnutes       Lorenza Chick PT, DPT

## 2021-06-13 ENCOUNTER — Ambulatory Visit: Payer: Medicare (Managed Care) | Admitting: Rehabilitative and Restorative Service Providers"

## 2021-06-13 NOTE — Progress Notes (Signed)
06/13/21 0700   Overview   Diagnosis Right shoulder pain   Insurance Medicare   Script Date 05/17/21   Missed Visit Canceled       Lorenza Chick PT, DPT

## 2021-06-21 ENCOUNTER — Encounter: Payer: Self-pay | Admitting: Endocrinology

## 2021-06-21 ENCOUNTER — Other Ambulatory Visit: Payer: Self-pay | Admitting: Endocrinology

## 2021-06-21 ENCOUNTER — Ambulatory Visit: Payer: Medicare (Managed Care) | Attending: Endocrinology | Admitting: Endocrinology

## 2021-06-21 VITALS — BP 107/60 | HR 75 | Ht 72.0 in | Wt 196.0 lb

## 2021-06-21 DIAGNOSIS — E1169 Type 2 diabetes mellitus with other specified complication: Secondary | ICD-10-CM

## 2021-06-21 DIAGNOSIS — E119 Type 2 diabetes mellitus without complications: Secondary | ICD-10-CM | POA: Insufficient documentation

## 2021-06-21 LAB — POCT HEMOGLOBIN A1C: Hemoglobin A1C,POC: 5.9 % — ABNORMAL HIGH

## 2021-06-21 MED ORDER — METFORMIN HCL 500 MG PO TABS *I*
500.0000 mg | ORAL_TABLET | Freq: Two times a day (BID) | ORAL | 3 refills | Status: DC
Start: 2021-06-21 — End: 2022-07-12

## 2021-06-21 NOTE — Progress Notes (Signed)
Subjective:      Jon [name] is a 87 y.o. male who presents for follow up of type 2 diabetes.      INTERVAL HISTORY: doing well since last visit.  No new illnesses  --Treatment history:  --Current management:  Current diabetic medications include:  Ozempic 0.5 mg weekly, Metformin 1 gm bid and Actos 15 mg daily   -- Self-glucose monitoring: 2-3 times/week.  States usually 90-120 mg/dL  -- Hyperglycemia: denies  -- Hypoglycemia: denies confusion, dizziness, headache, seizures, sweating, nightmares and LOC.  Hypoglycemia unawareness:  no  -- Endocrine: denies polyuria, polydipsia.  -- CV: denies chest pain, shortness of breath.  --Extremities:     -- Hands: denies tingling,numbness, pain     -- Feet: denies tingling, numbness, pain, foot ulcers  -- Eyes: denies history of laser treatment, double/blurry vision, retinopathy,  Last eye exam: 05/2021.  -- Kidney: denies history of nephropathy.      Patient's medications, allergies, past medical, surgical, social and family histories were reviewed and updated as appropriate.      Review of Systems  CONSTITUTIONAL:  Appetite good, no fevers, night sweats or weight loss  HEAD: No headache, dizziness, or syncope  EYES:No vision changes or eye pain  CV: No chest pain, shortness of breath, or edema.  RESPIRATORY:  No cough, wheezing, or dyspnea  GI:  No nausea, vomiting, abdominal pain, or change in bowel habits  NEURO:  No mental status changes, motor weakness, or sensory changes  PSYCH:  No depression or anxiety  MS:  + knee pain, no swelling, or musculoskeletal deformities  ENDOCRINE:  No polyuria, polydipsia, or heat intolerance.       Objective:     BP 107/60    Pulse 75    Ht 1.829 m (6')    Wt 88.9 kg (196 lb)    BMI 26.58 kg/m   GENERAL APPEARANCE: well developed, well nourished, no acute distress; aware and alert  HEENT: PERLA, EOMI, no lid lag, pink conjunctiva ; no proptosis   NECK: supple, no thyromegaly, no lymphadenopathy  HEART: RRR, normal S1, S2  CHEST:  lungs clear to auscultation bilaterally  EXTREMITIES: no clubbing, cyanosis, or edema. Pedal pulses 2+ bilaterally. Both feet clean without deformity, callous, ulceration, or fungal infection.  NEUROLOGICAL: Alert and oriented x 3. Decreased sensitivity to monofilament.    Lab Review  Lab Results   Component Value Date/Time    NA 142 12/23/2020 01:43 PM    K 4.9 12/23/2020 01:43 PM    CL 106 12/23/2020 01:43 PM    UN 25 (H) 12/23/2020 01:43 PM    CREAT 1.17 12/23/2020 01:43 PM    MALBR 1.59 12/23/2020 01:43 PM     Lab Results   Component Value Date/Time    CHOL 137 12/23/2020 01:43 PM    TRIG 78 12/23/2020 01:43 PM    HDL 51 12/23/2020 01:43 PM    LDLC 70 12/23/2020 01:43 PM     Lab Results   Component Value Date/Time    TSH 5.41 (H) 12/23/2020 01:43 PM        Recent Results (from the past 24 hour(s))   POCT HEMOGLOBIN A1C    Collection Time: 06/21/21  1:04 PM   Result Value Ref Range    Hemoglobin A1C,POC 5.9 (H) %        Assessment:          Jon [name] is a 87 y.o. male with Uncontrolled type 2 diabetes mellitus.  He denies any symptoms of hypoglycemia and A1c excellent on current medications.            Plan:         1.  DIABETES:  Decrease Metformin to 500 mg bid, continue Actos 15 mg daily and Ozempic 0.5 mg weekly      RETURN IN:  Will have him follow up with Dr Jolaine Click from now on since he is so well controlled.    Thank you for allowing me to participate in the care of this patient.  Please feel free to contact me if you have any questions or concerns.

## 2021-06-22 LAB — HM DIABETES FOOT EXAM

## 2021-06-23 ENCOUNTER — Ambulatory Visit: Payer: Medicare (Managed Care) | Admitting: Primary Care

## 2021-06-23 ENCOUNTER — Encounter: Payer: Self-pay | Admitting: Primary Care

## 2021-06-23 VITALS — BP 108/62 | HR 62 | Ht 72.0 in | Wt 194.1 lb

## 2021-06-23 DIAGNOSIS — E7800 Pure hypercholesterolemia, unspecified: Secondary | ICD-10-CM

## 2021-06-23 DIAGNOSIS — I480 Paroxysmal atrial fibrillation: Secondary | ICD-10-CM

## 2021-06-23 DIAGNOSIS — I10 Essential (primary) hypertension: Secondary | ICD-10-CM

## 2021-06-23 DIAGNOSIS — E1169 Type 2 diabetes mellitus with other specified complication: Secondary | ICD-10-CM

## 2021-06-23 DIAGNOSIS — I509 Heart failure, unspecified: Secondary | ICD-10-CM

## 2021-06-23 NOTE — Progress Notes (Signed)
Family Medicine Office Visit Note    Chief Complaint   Patient presents with    Follow-up     HTN       Subjective:  Jon [name] is a 87 y.o. male here for:    Hypertension  Patient has been adherent to current medications  Denies blurry vision, dizziness or lightheadedness.     Diabetes  Patient is asking if primary care provider can take over for endocrinology.  A1c has been very well controlled.  At last visit metformin was decreased.  Patient has been struggling with some lower leg weakness, instability.  He does not endorse any numbness or tingling.    Hypercholesterolemia  Patient adherent to statin.  Does not endorse muscle aching.  He is having some lower extremity weakness but this is been chronically worsening.  He is undergoing physical therapy.    Atrial fibrillation  Patient is adherent to current medications.  He is not endorse any chest pain or palpitations.    ROS: Per HPI    Patient's medications, allergies, and problem list were reviewed.        Current Outpatient Medications:     metFORMIN (GLUCOPHAGE) 500 mg tablet, Take 1 tablet (500 mg total) by mouth 2 times daily (with meals), Disp: 180 tablet, Rfl: 3    semaglutide 0.25 mg or 0.5 mg dose (OZEMPIC, 0.25 OR 0.5 MG/DOSE,) pen, Inject 0.38 mLs (0.5 mg total) into the skin once a week, Disp: 1.5 mL, Rfl: 6    generic DME, Dispense 1 single point cane. Use as directed., Disp: 1 each, Rfl: 0    apixaban (ELIQUIS) 5 mg tablet, Take 1 tablet (5 mg total) by mouth 2 times daily, Disp: 180 tablet, Rfl: 1    finasteride (PROSCAR) 5 mg tablet, TAKE 1 TABLET BY MOUTH EVERY DAY FOR USE BY MEN ONLY, Disp: 90 tablet, Rfl: 3    MYRBETRIQ 50 MG 24 hr tablet, TAKE 1 TABLET BY MOUTH EVERY DAY, Disp: 90 tablet, Rfl: 3    blood glucose (FREESTYLE LITE) test strip, USE TO TEST BLOOD SUGAR ONE TIME DAILY, Disp: 50 each, Rfl: 6    pioglitazone (ACTOS) 15 mg tablet, Take 1 tablet (15 mg total) by mouth daily, Disp: 90 tablet, Rfl: 3    rosuvastatin  (CRESTOR) 10 mg tablet, TAKE 1 TABLET BY MOUTH EVERY DAY WITH DINNER, Disp: 90 tablet, Rfl: 3    memantine (NAMENDA) 10 MG tablet, TAKE 1 TABLET BY MOUTH TWO TIMES DAILY, Disp: 180 tablet, Rfl: 3    insulin pen needle (BD PEN NEEDLE NANO 2ND GEN) 32G X 4 MM, Use 1 time per week., Disp: 30 each, Rfl: 2    blood glucose monitor (FREESTYLE LITE) kit, TEST AS DIRECTED, Disp: , Rfl:     Lancets Micro Thin 33G MISC, Use as directed once daily. Dispense lancets for one touch verio, Disp: 100 each, Rfl: 5    blood glucose monitor w/Device KIT, Test as directed, Disp: 1 kit, Rfl: 0    Sacubitril-Valsartan (ENTRESTO) 24-26 MG per tablet, Take 1 tablet by mouth 2 times daily, Disp: , Rfl:     Coenzyme Q10 200 MG capsule, Take 200 mg by mouth daily, Disp: , Rfl:     Omega-3 Fatty Acids (FISH OIL) 1000 MG CAPS capsule, Take 1 g by mouth daily, Disp: , Rfl:     metoprolol (TOPROL-XL) 25 MG 24 hr tablet, Take 25 mg by mouth every 12 hours, Disp: , Rfl:  dofetilide (TIKOSYN) 250 MCG capsule, Take 250 mcg by mouth 2 times daily, Disp: , Rfl:     amoxicillin (AMOXIL) 500 MG capsule, TAKE 4 CAPSULES BY MOUTH ONE HOUR BEFORE DENTAL APPOINTMENTS, Disp: , Rfl: 0    lancets (ONETOUCH ULTRASOFT), Use   2  times per day as instructed for blood glucose testing., Disp: 100 each, Rfl: 3    Blood Glucose Calibration (OT ULTRA/FASTTK CNTRL SOLN) SOLN, As directed, Disp: 1 each, Rfl: 5    CINNAMON PO, Take by mouth, Disp: , Rfl:     Multiple Vitamins-Minerals (MULTI VITAMIN/MINERALS PO), Take by mouth, Disp: , Rfl:     cholecalciferol (VITAMIN D) 1000 UNIT tablet, Take 2,000 Units by mouth daily    , Disp: , Rfl:     acetaminophen (TYLENOL) 500 MG tablet, Take 500 mg by mouth 2 times daily., Disp: , Rfl:     Objective:  Physical exam:  BP 108/62    Pulse 62    Ht 1.829 m (6')    Wt 88 kg (194 lb 1.6 oz)    SpO2 98%    BMI 26.32 kg/m   General appearance: Well appearing    Assessment/Plan:  1. Type 2 diabetes mellitus with  other specified complication, without long-term current use of insulin  Continue current medications, last A1c at goal  Due A1c in 6 months     2. Hypercholesteremia  Continue rosuvastatin 10 mg daily    3. Paroxysmal atrial fibrillation  Continue metoprolol 25 mg BID  Continue apixaban 5 mg twice daily    4. Congestive heart failure, stable  Continue current medications.    5.  Essential hypertension  At goal today.  Continue current medications.  Encourage patient to stay well-hydrated given his blood pressure slightly low.  Patient denies any dizziness or lightheadedness.      Follow up in about 6 months (around 12/24/2021) for Annual wellness visit/CPE.    Georgiann Mccoy, MD, MPH, MA  Family Medicine  [facility]

## 2021-06-24 ENCOUNTER — Ambulatory Visit: Payer: Medicare (Managed Care) | Admitting: Rehabilitative and Restorative Service Providers"

## 2021-07-15 ENCOUNTER — Ambulatory Visit
Payer: Medicare (Managed Care) | Attending: Emergency Medicine | Admitting: Rehabilitative and Restorative Service Providers"

## 2021-07-15 DIAGNOSIS — G8929 Other chronic pain: Secondary | ICD-10-CM | POA: Insufficient documentation

## 2021-07-15 DIAGNOSIS — M25511 Pain in right shoulder: Secondary | ICD-10-CM | POA: Insufficient documentation

## 2021-07-15 NOTE — Progress Notes (Signed)
Sent via: eRecord EMR    Physician attestation for [facility] Plan of Care: Physician/NP/PA:  Dr. Arelia Sneddon  Per signature, I have reviewed and agree with the documented plan of care.    ___________________________________________________________    Please sign this Medicare plan of care for outpatient therapy treatment as required by Medicare. If you have any questions, please contact us at [phone]. We appreciate your prompt attention to this request.    Sincerely,   [facility]       [place]  [id number]  1. Chronic right shoulder pain         History:  Onset date:  Chronic     Date of surgery: NA    Jon [name] is a 87 y.o. male who is present today for right shoulder care.    Patient states he fell on Monday while trying to put his shoes on. Patient fell onto outstretched hands and did not hit his head and had no loss of consciousness.    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 There is diffuse, profound osteopenia. 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 Subcortical sclerosis involving the greater tuberosity of the humerus suggests chronic rotator cuff   pathology.     END OF IMPRESSION     Symptom location: Lateral and Anterior, right  Relevant symptoms: Aching, Pain , Decreased ROM, Decreased strength  Symptom frequency: Persistent  Symptom intensity (0 - 10 scale): Now 3 Best 1 Worst 6   Night Pain: No      Symptoms worsen with: Lifting, Carrying, swinging golf club, raising overhead  Symptoms  improve with: Rest  Assistive device:  none  Patients goals for therapy: Perform all self care ADLs (bathing, hygiene, dressing, eating) independently, Reduce pain, Increase ROM / flexibility, Increase strength, Return to sports / activities     Objective:    Observation: forward and rounded shoulders  Palpation: tenderness, at joint, localized and Lateral and Anterior @ joint  Cervical Screen:  Not Tested  Neurologic:  NA        ROM/Strength         AROM AROM PROM PROM MMT MMT    Right Left Right Left Right Left   Shoulder          Forward Flexion 85 110       Scaption         ER at 0 ABD     4-/5 4+/5   ER at 20 ABD 20 45       ER at 45 ABD 40 60       ER at 90 ABD                  IR at 0 ABD         IR at 45 ABD         IR at 90 ABD         A[facility]y Functional IR         Elbow ROM  Special Tests:    Shoulder Jobe, positive  Michel Bickers,  positive   Elbow NA   Wrist/hand NA      Functional:  Perform self-care activities/basic ADLs - able to perform.  Perform functional forward reach - unable to perform.  Reach overhead - unable to perform.  Throw - unable to perform.  Lift greater than 5 lbs - unable to perform.    Assessment:   Findings consistent with a 87 y.o. male with Right sided shoulder pain and discomfort that aligns with your impingement. Patients functional range of motion is impacted as noted above. Patient presents with pain, ROM limitations, strength limitations, functional limitations    Personal factors affecting treatment/recovery:   Advanced age  Comorbidities affecting treatment/recovery:   Paget's disease  Clinical presentation:   stable  Patient complexity:     moderate level as indicated by above stability of condition, personal factors, environmental factors and comorbidities in addition to patient symptom presentation and impairments found on physical exam.    Prognosis:  Fair    Contraindications/Precautions/Limitation:  Per diagnosis    Short Term Goals: (5 week(s)):  Decrease c/o max pain to < 4/10, Increase ROM ( flexion to 110 ), Increase strength of rotator cuff, scapulothoracic muscles, deltoid to promote proper GHJ/ST stability and force coupling for arm elevation/ UE function and Minimal assistance with HEP/ education concepts  Long Term Goals: (3 month(s)): Pain/Sx 0 - minimal, ROM/ flexibility WNL , Restoration of functional strength, Independent with HEP/education , Functional return to ADLs / activities without limitations     Treatment Plan:   Options / plan reviewed with patient:  Yes  Freq 1-2 times per week for 3 month(s)    Treatment plan inclusive of:  Exercise: AROM, AAROM, PROM, Stretching, Strengthening, Progressive Resistive  Manual Techniques:  Joint mobilization, manual therapies as appropriate to stage of healing  Modalities:  Cold pack, Cryotherapy, Functional/Therapeutic activites per flowsheet, Joint Mobilizations, Moist heat, Neuromuscular Re-ed,  Activity shoulder proprioception/ rot cuff, scapula and deltoid force coupling for shoulder elevation and scapular upward rotation, Ther Exercise per flowsheet  Functional: Proprioception/Dynamic stability, Functional rehab, Endurance training      Thank you for the referral of this patient to [facility].    Dirk Dress, PT    Supine Shoulder ER '@30'$  ABD 2 x 20  '@45'$  ABD 2 x 20   Supine shoulder ER  PTB 2 x 20   Shoulder ER B in seated 2 x 20 x 5 sec                                     Treatment Start Time 1100   Treatment End Time 1145   Total Minutes of treatment 45       Total Non-Treatment time (rest)    Total Service Based min of treatment 30   Total Time-Based min of treatment 15           Service-Based Procedures/ Modalities    Evaluation 30   Re-evaluation    Traction, mechanical    Electric stimulation (unattended)    Vasopneumatic device    Whirlpool    Total Service Based Treatment 1       Time-Based Procedures / Modalities    Therapeutic ex 15    Manual Therapy    Therapeutic Activities    Neuromuscular Re-ed    Physical Performance Test  Gait training, including stairs        Ultrasound    Electric stimulation (manual)    Iontophoresis    Contrast baths    Total Time-Based Treatment 1          POC DATE: 07/15/2021

## 2021-07-17 ENCOUNTER — Ambulatory Visit: Payer: Medicare (Managed Care) | Admitting: Rehabilitative and Restorative Service Providers"

## 2021-08-09 ENCOUNTER — Ambulatory Visit
Payer: Medicare (Managed Care) | Attending: Emergency Medicine | Admitting: Rehabilitative and Restorative Service Providers"

## 2021-08-09 DIAGNOSIS — M25511 Pain in right shoulder: Secondary | ICD-10-CM | POA: Insufficient documentation

## 2021-08-09 DIAGNOSIS — G8929 Other chronic pain: Secondary | ICD-10-CM | POA: Insufficient documentation

## 2021-08-09 NOTE — Progress Notes (Signed)
[facility]  PT Treatment Note    Todays Date: 08/09/2021    Name: Jon [name]  DOB: 07-09-1934  Referring Physician: Darl Householder, NP  Diagnosis:   1. Chronic right shoulder pain         Visit #: Visit count could not be calculated. Make sure you are using a visit which is associated with an episode.    Subjective:    Pain Assessment: 2    Pt reports an improvement in ability to perform reaching based activities since prior treatment session. and Pt reports an improvement in pain intensity and pain frequency since previous treatment session.       Objective:  ROM/Strength         AROM AROM PROM PROM MMT MMT    Right Left Right Left Right Left   Shoulder          Forward Flexion 85 110       Scaption         ER at 0 ABD     4-/5 4+/5   ER at 20 ABD 20 45       ER at 45 ABD 40 60       ER at 90 ABD                  IR at 0 ABD         IR at 45 ABD         IR at 90 ABD         A[facility]y Functional IR         Elbow ROM             Special Tests:    Shoulder Jobe, positive  Michel Bickers,  positive   Elbow NA   Wrist/hand NA      Functional:  Perform self-care activities/basic ADLs - able to perform.  Perform functional forward reach - unable to perform.  Reach overhead - unable to perform.  Throw - unable to perform.  Lift greater than 5 lbs - unable to perform.    Education:  Updated HEP, Verbal cues for ther ex, Manual cues for ther ex, Joint ed concepts    Objective        Treatment:  Ther Exercise per flowsheet    Assessment:   Pt returns to [facility] today for f/u care concerning his shoulder pain and corresponding discomfort. Progressive emphasis placed on restoring the pateints active range of motion. Patient tolerated interventions against gravity well without load but was challenged as demand increased. HEP adjusted in accordance.      Plan of Care:  Continue per Plan of care -  As written; Patient would benefit from skilled rehabilitation services to address the above  impairments to restore functional capacity., Add F IR    Thank you for referring this patient to [facility] [facility]    Dirk Dress, PT    Seated pulley Flexion/scaption   Supine Shoulder ER '@30'$  ABD 2 x 20  '@45'$  ABD 2 x 20   Supine shoulder ER  PTB 2 x 20   Shoulder ER B in seated 2 x 20 x 5 sec   Jackins elevation series Supine/30/45/60                                 Treatment Start Time  1530   Treatment End Time 1600   Total Minutes of treatment 30       Total Non-Treatment time (rest)    Total Service Based min of treatment    Total Time-Based min of treatment 30           Service-Based Procedures/ Modalities    Evaluation    Re-evaluation    Traction, mechanical    Electric stimulation (unattended)    Vasopneumatic device    Whirlpool    Total Service Based Treatment        Time-Based Procedures / Modalities    Therapeutic ex 30   Manual Therapy    Therapeutic Activities    Neuromuscular Re-ed    Physical Performance Test    Gait training, including stairs        Ultrasound    Electric stimulation (manual)    Iontophoresis    Contrast baths    Total Time-Based Treatment 2          POC DATE: 07/15/2021

## 2021-08-11 ENCOUNTER — Other Ambulatory Visit: Payer: Self-pay | Admitting: Primary Care

## 2021-08-11 NOTE — Telephone Encounter (Signed)
LOV 06/23/21. NOV 01/24/22

## 2021-08-18 ENCOUNTER — Ambulatory Visit: Payer: Medicare (Managed Care) | Admitting: Primary Care

## 2021-08-18 ENCOUNTER — Encounter: Payer: Self-pay | Admitting: Primary Care

## 2021-08-18 VITALS — BP 116/64 | HR 80 | Ht 72.01 in | Wt 193.4 lb

## 2021-08-18 DIAGNOSIS — R2681 Unsteadiness on feet: Secondary | ICD-10-CM

## 2021-08-18 DIAGNOSIS — W19XXXA Unspecified fall, initial encounter: Secondary | ICD-10-CM

## 2021-08-18 DIAGNOSIS — Z0000 Encounter for general adult medical examination without abnormal findings: Secondary | ICD-10-CM

## 2021-08-18 DIAGNOSIS — I10 Essential (primary) hypertension: Secondary | ICD-10-CM

## 2021-08-18 NOTE — Progress Notes (Signed)
Family Medicine Office Visit Note    Chief Complaint   Patient presents with    Follow-up     PT referral       Subjective:  Jon [name] is a 87 y.o. male here for:    Is having difficulty with balance, particularly with walking, golf, is using cane most of the time now. Would like to have better strength and balance. Had fall recently.     Feeling well otherwise, denies chest pain, palpitations, dizziness.   Adherent to medications     ROS: Per HPI    Patient's medications, allergies, and problem list were reviewed.        Current Outpatient Medications:     ELIQUIS 5 MG tablet, TAKE 1 TABLET BY MOUTH EVERY 12 HOURS, Disp: 180 tablet, Rfl: 1    metFORMIN (GLUCOPHAGE) 500 mg tablet, Take 1 tablet (500 mg total) by mouth 2 times daily (with meals), Disp: 180 tablet, Rfl: 3    semaglutide 0.25 mg or 0.5 mg dose (OZEMPIC, 0.25 OR 0.5 MG/DOSE,) pen, Inject 0.38 mLs (0.5 mg total) into the skin once a week, Disp: 1.5 mL, Rfl: 6    generic DME, Dispense 1 single point cane. Use as directed., Disp: 1 each, Rfl: 0    finasteride (PROSCAR) 5 mg tablet, TAKE 1 TABLET BY MOUTH EVERY DAY FOR USE BY MEN ONLY, Disp: 90 tablet, Rfl: 3    MYRBETRIQ 50 MG 24 hr tablet, TAKE 1 TABLET BY MOUTH EVERY DAY, Disp: 90 tablet, Rfl: 3    blood glucose (FREESTYLE LITE) test strip, USE TO TEST BLOOD SUGAR ONE TIME DAILY, Disp: 50 each, Rfl: 6    pioglitazone (ACTOS) 15 mg tablet, Take 1 tablet (15 mg total) by mouth daily, Disp: 90 tablet, Rfl: 3    rosuvastatin (CRESTOR) 10 mg tablet, TAKE 1 TABLET BY MOUTH EVERY DAY WITH DINNER, Disp: 90 tablet, Rfl: 3    memantine (NAMENDA) 10 MG tablet, TAKE 1 TABLET BY MOUTH TWO TIMES DAILY, Disp: 180 tablet, Rfl: 3    insulin pen needle (BD PEN NEEDLE NANO 2ND GEN) 32G X 4 MM, Use 1 time per week., Disp: 30 each, Rfl: 2    blood glucose monitor (FREESTYLE LITE) kit, TEST AS DIRECTED, Disp: , Rfl:     Lancets Micro Thin 33G MISC, Use as directed once daily. Dispense lancets for one  touch verio, Disp: 100 each, Rfl: 5    blood glucose monitor w/Device KIT, Test as directed, Disp: 1 kit, Rfl: 0    Sacubitril-Valsartan (ENTRESTO) 24-26 MG per tablet, Take 1 tablet by mouth 2 times daily, Disp: , Rfl:     Coenzyme Q10 200 MG capsule, Take 200 mg by mouth daily, Disp: , Rfl:     Omega-3 Fatty Acids (FISH OIL) 1000 MG CAPS capsule, Take 1 g by mouth daily, Disp: , Rfl:     metoprolol (TOPROL-XL) 25 MG 24 hr tablet, Take 25 mg by mouth every 12 hours, Disp: , Rfl:     dofetilide (TIKOSYN) 250 MCG capsule, Take 250 mcg by mouth 2 times daily, Disp: , Rfl:     amoxicillin (AMOXIL) 500 MG capsule, TAKE 4 CAPSULES BY MOUTH ONE HOUR BEFORE DENTAL APPOINTMENTS, Disp: , Rfl: 0    lancets (ONETOUCH ULTRASOFT), Use   2  times per day as instructed for blood glucose testing., Disp: 100 each, Rfl: 3    Blood Glucose Calibration (OT ULTRA/FASTTK CNTRL SOLN) SOLN, As directed, Disp: 1 each, Rfl:  5    CINNAMON PO, Take by mouth, Disp: , Rfl:     Multiple Vitamins-Minerals (MULTI VITAMIN/MINERALS PO), Take by mouth, Disp: , Rfl:     cholecalciferol (VITAMIN D) 1000 UNIT tablet, Take 2,000 Units by mouth daily    , Disp: , Rfl:     acetaminophen (TYLENOL) 500 MG tablet, Take 500 mg by mouth 2 times daily., Disp: , Rfl:     Objective:  Physical exam:  BP 116/64    Pulse 80    Ht 1.829 m (6' 0.01")    Wt 87.7 kg (193 lb 6.4 oz)    SpO2 97%    BMI 26.22 kg/m   General appearance: Well appearing    Assessment/Plan:  1. Healthcare maintenance  - Comprehensive metabolic panel; Future  - CBC; Future  - Lipid Panel (Reflex to Direct  LDL if Triglycerides more than 400); Future  - PSA (eff.03-2009); Future    2. Gait instability  - AMB REFERRAL TO PHYSICAL / OCCUPATIONAL THERAPY    3. Fall  - AMB REFERRAL TO PHYSICAL / OCCUPATIONAL THERAPY    4. Hypertension  At goal, continue current medications     Follow up if symptoms worsen or fail to improve.    Georgiann Mccoy, MD, MPH, MA  Family Medicine  [facility]

## 2021-08-29 ENCOUNTER — Other Ambulatory Visit: Payer: Self-pay | Admitting: Primary Care

## 2021-08-29 NOTE — Telephone Encounter (Signed)
LOV 08/18/21. NOV 01/24/22

## 2021-09-19 ENCOUNTER — Ambulatory Visit: Payer: Medicare (Managed Care) | Admitting: Rehabilitative and Restorative Service Providers"

## 2021-09-22 ENCOUNTER — Encounter: Payer: Self-pay | Admitting: Gastroenterology

## 2021-10-10 ENCOUNTER — Other Ambulatory Visit: Payer: Self-pay

## 2021-10-10 ENCOUNTER — Ambulatory Visit: Payer: Medicare (Managed Care) | Attending: Primary Care | Admitting: Rehabilitative and Restorative Service Providers"

## 2021-10-10 DIAGNOSIS — G8929 Other chronic pain: Secondary | ICD-10-CM | POA: Insufficient documentation

## 2021-10-10 DIAGNOSIS — R269 Unspecified abnormalities of gait and mobility: Secondary | ICD-10-CM | POA: Insufficient documentation

## 2021-10-10 DIAGNOSIS — M25511 Pain in right shoulder: Secondary | ICD-10-CM | POA: Insufficient documentation

## 2021-10-10 DIAGNOSIS — M6281 Muscle weakness (generalized): Secondary | ICD-10-CM | POA: Insufficient documentation

## 2021-10-10 NOTE — Progress Notes (Signed)
Sent via: eRecord EMR    Physician attestation for [facility] Plan of Care: Physician/NP/PA:  Dr. Jolaine Click  Per signature, I have reviewed and agree with the documented plan of care.    ___________________________________________________________    Please sign this Medicare plan of care for outpatient therapy treatment as required by Medicare. If you have any questions, please contact us at [phone]. We appreciate your prompt attention to this request.    Sincerely,   [place] [facility] [place] [facility]     Todays Date: 10/10/2021    Name: Jon [name]  DOB: August 12, 1934  Referring Physician/Provider: Karsten Fells, MD  Diagnosis:     ICD-10-CM ICD-9-CM   1. Abnormal gait due to muscle weakness  M62.81 781.2    R26.9 728.87     Procedure (if applicable):  prior medial hemiarthroplasty knee  Onset date: since diabetic neuropathy (8 years)  Date of surgery: 15 years    History  Jon [name] is a 87 y.o. male who presents today for gait / balance care s/p   long standing history of diabetic neuropathy and prior partial knee replacement.    Mechanism of injury/history of symptoms:  sequala from past surgical intervention and aging..    Pt reports pain is mild.  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, Actuary and Sports  Sports/Activities:  Golf    Objective  Observation: forward and rounded shoulders, varus knee deformity   Gait: Antalgic and shuffling   Assistive devices: none, will at times use RW, SC   Neurologic:  Heel walking: heel raise - WNL  Toe Walking: toe raise - WNL    Palpation: tenderness, at joint, localized and Anterior  Incision:  NA    ROM/Strength    FOOT STRENGTH  NEUROMUSCULAR CONTROL LEFT RIGHT        Toe Curl NA NA   Toe Fan NA NA   Intrinsic Control fair  fair    Single Leg Balance (requiring hand held assist poor poor          Balance and Coordination  Romberg on firm surface / Eyes Open  30 sec without loss of balance  Tandem stance on firm surface/ Eyes Open  loss of balance  Single leg on firm surface/ Eyes Open  loss of balance  Tandem walking: loss of balance    Balance Error Scoring System Test (BESS)    ?SCORE CARD   (# errors) FIRM Surface FOAM Surface Comment   Non-Dominant Limb ? ?    Double Leg Stance  (feet together)  ? ?   Single Leg Stance  (non-dominant foot) ? ? ?   Tandem Stance  (non-dom foot in rear) ? ? ?   TOTAL SCORE ? ? To be completed at a later date     Special  Tests:   NA      Functional:  Perform self-care activities/basic ADLs - able to perform.  Stand more than 30 minutes, - unable to perform.  Sit more than 30 minutes - able to perform.  Walk more than 20 minutes - unable to perform.  Ascend stairs with reciprocal gait - able to perform.  Descend stairs with reciprocal gait - unable to perform.  Return to sport/activities - unable to perform.    Education:   Patient instructed in immediate post injury and/or post -operative care inclusive of foot & ankle precautions and restrictions specific to the patients condition: N/A   Appropriate use of (splint/boot/brace/post op shoe) was reviewed: N/A   Proper assistive device use for ambulation and stairs and incisional care reviewed as/if applicable: N/A   Introductory home exercise program was provided: Yes   Patient demonstrates good   understanding of the above concepts; Patient was invited to contact clinic with any questions or concerns.       Assessment:  Findings consistent with 87 y.o. y.o. male s/p lower limb weakness and disordered gait in association with poor balance. Patient currently presents with pain, strength limitations, functional limitations.    Personal factors affecting treatment/recovery:   Advanced age  Comorbidities affecting treatment/recovery:   diabetic neuropathy  Clinical presentation:   evolving  Patient complexity:     moderate level as indicated by above stability of condition, personal factors, environmental factors and comorbidities in addition to patient symptom presentation and impairments found on physical exam.    Prognosis:  Fair     Contraindications/Precautions: per diagnosis/per protocol    Goals  Short Term Goals: (6 week(s))              1.) Independent with immediate condition management, inclusive of post injury / post operative instructions as applicable.   2)  Independent with use of appropriate assistive device as applicable for safe home and community mobility.   3.) Patient will demonstrate understanding of ROM and weight bearing restrictions as appropriate for management of their foot and ankle injury.  4.) Patient independent with introductory HEP    Long Term Goals: (3 month(s))  Restoration of functional strength, Independent with HEP/education , Functional return to ADLs / activities without limitations     Treatment Plan:   Plan/treatment options reviewed with patient:  Yes.   Treatment Frequency: Freq 1-2 times per week for 3 month(s)    Treatment plan inclusive of:   Exercise: AROM, AAROM, PROM, Stretching, Strengthening, Progressive Resistive   Manual Techniques:  N/A   Modalities:  Functional/Therapeutic activites per flowsheet, Ther Exercise per flowsheet   Functional: Proprioception/Dynamic stability, Functional rehab, Postural training, Gait training, Balance , Endurance  training    Thank you for the referral of this patient to [facility] [place].     Dirk Dress, PT        WEIGHTBEARING STATUS as tolerated   Bike X 10 mins (ADD)   Hip abduction in hooklying    Supine hip abduction long sit    Supine SLR banded    Standing scapular pull back with heel raise    Weight shifts airex (M/L; diagonals)                        Treatment Start Time 1100   Treatment End Time 1145   Total Minutes of treatment 45       Total  Non-Treatment time (rest)    Total Service Based min of treatment 30   Total Time-Based min of treatment 15           Service-Based Procedures/ Modalities    Evaluation 30   Re-evaluation    Traction, mechanical    Electric stimulation (unattended)    Vasopneumatic device    Whirlpool    Total Service Based Treatment 1       Time-Based Procedures / Modalities    Therapeutic ex 15   Manual Therapy    Therapeutic Activities    Neuromuscular Re-ed    Physical Performance Test    Gait training, including stairs        Ultrasound    Electric stimulation (manual)    Iontophoresis    Contrast baths    Total Time-Based Treatment 1          POC DATE: 10/10/2021

## 2021-11-14 ENCOUNTER — Ambulatory Visit: Payer: Medicare (Managed Care) | Admitting: Rehabilitative and Restorative Service Providers"

## 2021-11-28 ENCOUNTER — Ambulatory Visit: Payer: Medicare (Managed Care) | Admitting: Rehabilitative and Restorative Service Providers"

## 2021-11-29 ENCOUNTER — Other Ambulatory Visit
Admission: RE | Admit: 2021-11-29 | Discharge: 2021-11-29 | Disposition: A | Discharge status: Home / Self Care | Payer: Medicare (Managed Care) | Source: Ambulatory Visit | Attending: Endocrinology | Admitting: Endocrinology

## 2021-11-29 ENCOUNTER — Telehealth: Payer: Self-pay | Admitting: Urology

## 2021-11-29 DIAGNOSIS — Z0000 Encounter for general adult medical examination without abnormal findings: Secondary | ICD-10-CM | POA: Insufficient documentation

## 2021-11-29 DIAGNOSIS — E119 Type 2 diabetes mellitus without complications: Secondary | ICD-10-CM | POA: Insufficient documentation

## 2021-11-29 DIAGNOSIS — IMO0002 Reserved for concepts with insufficient information to code with codable children: Secondary | ICD-10-CM

## 2021-11-29 DIAGNOSIS — R350 Frequency of micturition: Secondary | ICD-10-CM | POA: Insufficient documentation

## 2021-11-29 DIAGNOSIS — Z125 Encounter for screening for malignant neoplasm of prostate: Secondary | ICD-10-CM | POA: Insufficient documentation

## 2021-11-29 LAB — LIPID PANEL
Chol/HDL Ratio: 3.1
Cholesterol: 109 mg/dL
HDL: 35 mg/dL — ABNORMAL LOW (ref 40–60)
LDL Calculated: 43 mg/dL
Non HDL Cholesterol: 74 mg/dL
Triglycerides: 153 mg/dL — AB

## 2021-11-29 LAB — COMPREHENSIVE METABOLIC PANEL
ALT: 18 U/L (ref 0–50)
AST: 20 U/L (ref 0–50)
Albumin: 3.6 g/dL (ref 3.5–5.2)
Alk Phos: 76 U/L (ref 40–130)
Anion Gap: 16 (ref 7–16)
Bilirubin,Total: 0.4 mg/dL (ref 0.0–1.2)
CO2: 20 mmol/L (ref 20–28)
Calcium: 9.3 mg/dL (ref 8.6–10.2)
Chloride: 102 mmol/L (ref 96–108)
Creatinine: 1.41 mg/dL — ABNORMAL HIGH (ref 0.67–1.17)
Glucose: 173 mg/dL — ABNORMAL HIGH (ref 60–99)
Lab: 28 mg/dL — ABNORMAL HIGH (ref 6–20)
Potassium: 5 mmol/L (ref 3.3–5.1)
Sodium: 138 mmol/L (ref 133–145)
Total Protein: 6.4 g/dL (ref 6.3–7.7)
eGFR BY CREAT: 49 * — AB

## 2021-11-29 LAB — CBC
Hematocrit: 38 % — ABNORMAL LOW (ref 40–51)
Hemoglobin: 11.8 g/dL — ABNORMAL LOW (ref 13.7–17.5)
MCH: 31 pg (ref 26–32)
MCHC: 31 g/dL — ABNORMAL LOW (ref 32–37)
MCV: 99 fL — ABNORMAL HIGH (ref 79–92)
Platelets: 195 10*3/uL (ref 150–330)
RBC: 3.8 MIL/uL — ABNORMAL LOW (ref 4.6–6.1)
RDW: 13.2 % (ref 11.6–14.4)
WBC: 7.4 10*3/uL (ref 4.2–9.1)

## 2021-11-29 LAB — PSA (EFF.4-2010): PSA (eff. 4-2010): 0.2 ng/mL (ref 0.00–4.00)

## 2021-11-29 LAB — MULTIPLE ORDERING DOCS

## 2021-11-29 LAB — T4, FREE: Free T4: 1.1 ng/dL (ref 0.9–1.7)

## 2021-11-29 LAB — HEMOGLOBIN A1C: Hemoglobin A1C: 6.8 % — ABNORMAL HIGH

## 2021-11-29 LAB — TSH: TSH: 4.25 u[IU]/mL — ABNORMAL HIGH (ref 0.27–4.20)

## 2021-11-29 NOTE — Telephone Encounter (Signed)
Patient and wife calling symptomatic line  Complaining of increased urgency, frequency  States patient is urinating approximately every 1-2 hours  Denies any other symptoms  Drinking approximately 8-16 ounces of fluids daily, including a cup of coffee  Is taking Myrbetriq and finasteride, unsure if Myrbetriq is really helping  Advised to increase fluid intake 64-80 ounces of fluid daily, mostly water  Patient denies fluid restriction  Advised to avoid bladder irritants  Patient will leave urine sample at UR lab, order in    Please advise, thanks Lovena Le

## 2021-11-30 ENCOUNTER — Other Ambulatory Visit: Payer: Self-pay

## 2021-12-01 ENCOUNTER — Telehealth: Payer: Self-pay | Admitting: Urology

## 2021-12-01 LAB — AEROBIC CULTURE

## 2021-12-01 MED ORDER — AMOXICILLIN 500 MG PO CAPS *I*
500.0000 mg | ORAL_CAPSULE | Freq: Two times a day (BID) | ORAL | 0 refills | Status: DC
Start: 2021-12-01 — End: 2024-08-27

## 2021-12-01 NOTE — Telephone Encounter (Signed)
Urine culture positive. Pls let patient know I have Rx amoxicillin.

## 2021-12-04 NOTE — Telephone Encounter (Signed)
Call to home of patient, spoke with spouse [name] to advise urine culture for patient is positive, antibiotics sent to pharmacy.

## 2021-12-05 MED ORDER — OZEMPIC (0.25 OR 0.5 MG/DOSE) 2 MG/1.5ML SC SOPN
0.5000 mg | PEN_INJECTOR | SUBCUTANEOUS | 6 refills | Status: DC
Start: 2021-12-05 — End: 2022-01-03

## 2021-12-21 ENCOUNTER — Other Ambulatory Visit: Payer: Self-pay | Admitting: Endocrinology

## 2021-12-25 ENCOUNTER — Ambulatory Visit: Payer: Medicare (Managed Care) | Admitting: Endocrinology

## 2022-01-02 ENCOUNTER — Other Ambulatory Visit: Payer: Self-pay | Admitting: Primary Care

## 2022-01-02 NOTE — Telephone Encounter (Signed)
Can change to a different medication within the same medication class.     Georgiann Mccoy, MD, MPH  [facility]

## 2022-01-02 NOTE — Telephone Encounter (Signed)
semaglutide 0.25 mg or 0.5 mg dose (OZEMPIC, 0.25 OR 0.5 MG/DOSE,) pen    [name], Patients wife called said that patient pharmacy said the above medication is on a [place] wide back order and they are concerned because patient on has 2 weeks left of the medication.    [name] is wondering in 2 weeks if patient is unable to get his ozempic medication what would Dr. Jolaine Click like them to do.

## 2022-01-03 MED ORDER — DULAGLUTIDE 0.75 MG/0.5ML SC SOAJ *I*
0.7500 mg | SUBCUTANEOUS | 3 refills | Status: DC
Start: 2022-01-03 — End: 2022-01-24

## 2022-01-03 NOTE — Telephone Encounter (Signed)
Last office Visit: 08/18/2021   Next office Visit: 01/24/2022   Recent PCP visit: Recent Visits  Date Type Provider Dept   08/18/21 Office Visit Woz, Shaula R, MD [facility]   Showing recent visits within past 185 days in an active department and meeting all other requirements  Future Appointments  No visits were found meeting these conditions.  Showing future appointments within next 0 days in an active department and meeting all other requirements       LABORATORY & DATA:  Renal Func  Lab Results   Component Value Date/Time    K 5.0 11/29/2021 11:37 AM    CREAT 1.41 (H) 11/29/2021 11:37 AM    RFGRC 49 (!) 11/29/2021 11:37 AM    CA 9.3 11/29/2021 11:37 AM    TFT  Lab Results   Component Value Date/Time    TSH 4.25 (H) 11/29/2021 11:37 AM      Lipids  Lab Results   Component Value Date/Time    CHOL 109 11/29/2021 11:37 AM    HDL 35 (L) 11/29/2021 11:37 AM    LDLC 43 11/29/2021 11:37 AM    TRIG 153 (!) 11/29/2021 11:37 AM    A1c  Lab Results   Component Value Date/Time    HA1C 6.8 (H) 11/29/2021 11:37 AM    HA1C 6.3 (H) 12/23/2020 01:43 PM    HA1C 7.1 (H) 02/17/2020 10:44 AM      LFTs  Lab Results   Component Value Date/Time    AST 20 11/29/2021 11:37 AM    ALT 18 11/29/2021 11:37 AM    PT/INR  Lab Results   Component Value Date/Time    INR 2.1 (H) 01/19/2017 01:05 AM    INR 2.0 (H) 01/16/2017 09:59 AM    INR 1.97 (H) 12/28/2016 11:48 AM    PTI 24.4 (H) 01/19/2017 01:05 AM    PTI 23.0 (H) 01/16/2017 09:59 AM    PTI 20.9 (H) 12/28/2016 11:48 AM      WBC  Lab Results   Component Value Date/Time    WBC 7.4 11/29/2021 11:37 AM    HGB 11.8 (L) 11/29/2021 11:37 AM    HCT 38 (L) 11/29/2021 11:37 AM    PLT 195 11/29/2021 11:37 AM    Inflammatory Markers  Lab Results   Component Value Date/Time    ESR 10 04/29/2013 12:43 PM    CRP <1 12/23/2018 08:30 AM      UCDS: No results found for: [id number]

## 2022-01-09 LAB — HM DIABETES EYE EXAM

## 2022-01-12 ENCOUNTER — Other Ambulatory Visit: Payer: Self-pay | Admitting: Urology

## 2022-01-16 ENCOUNTER — Other Ambulatory Visit: Payer: Self-pay | Admitting: Urology

## 2022-01-16 DIAGNOSIS — R3989 Other symptoms and signs involving the genitourinary system: Secondary | ICD-10-CM

## 2022-01-16 MED ORDER — MIRABEGRON ER 50 MG PO TB24 *I*
50.0000 mg | ORAL_TABLET | Freq: Every day | ORAL | 3 refills | Status: DC
Start: 2022-01-16 — End: 2023-01-25

## 2022-01-17 ENCOUNTER — Ambulatory Visit: Payer: Medicare (Managed Care) | Admitting: Urology

## 2022-01-17 ENCOUNTER — Other Ambulatory Visit: Payer: Self-pay

## 2022-01-17 VITALS — Ht 70.5 in | Wt 190.0 lb

## 2022-01-17 DIAGNOSIS — N401 Enlarged prostate with lower urinary tract symptoms: Secondary | ICD-10-CM

## 2022-01-17 DIAGNOSIS — R3989 Other symptoms and signs involving the genitourinary system: Secondary | ICD-10-CM

## 2022-01-17 LAB — POCT BLADDER SCAN PVR: Residual mL: 42

## 2022-01-17 LAB — POCT URINALYSIS DIPSTICK
Blood,UA POCT: NEGATIVE
Glucose,UA POCT: 1000 mg/dL — AB
Ketones,UA POCT: NEGATIVE mg/dL
Leuk Esterase,UA POCT: NEGATIVE
Lot #: 62072801
Nitrite,UA POCT: NEGATIVE
PH,UA POCT: 5 (ref 5–8)
Protein,UA POCT: NEGATIVE mg/dL
Specific gravity,UA POCT: 1.02 (ref 1.002–1.030)

## 2022-01-17 NOTE — Progress Notes (Signed)
[facility] Urology Follow Up Visit    Jon [name]  01/17/2022    Chief complaint : bph f/u  Pt is accompanied by his wife  HPI: States his LUTs improved significantly with tighter glucose control. HA1c 6.8    States leaking less and urgency and frequency have improved.  Endorses he has more good days than bad days. He continues on myrbetriq and still considering going off.   Previously had a rash on the head of his uncircumcised penis which has now completely resolved.  He retracts the foreskin to wash 2 times per week. No other complaints.     Changes since prior visit :   PMH -   Past Medical History:   Diagnosis Date    Arthritis     BPH (benign prostatic hypertrophy)     CHF (congestive heart failure)     Clotting disorder     Dementia     mild    Diabetes mellitus     Paget's disease of bone     Palpitations     Protein S deficiency     On chronic anticoagulation    Sinus bradycardia     Varicella      PSH -   Past Surgical History:   Procedure Laterality Date    APPENDECTOMY      CHOLECYSTECTOMY      EYE SURGERY      GALLBLADDER SURGERY      JOINT REPLACEMENT      KNEE REPLACEMENT      SKIN GRAFT      TONSILLECTOMY       Medications -   Current Outpatient Medications   Medication Sig    mirabegron (MYRBETRIQ) 50 mg 24 hr tablet Take 1 tablet (50 mg total) by mouth daily    dulaglutide (TRULICITY) 7.10 GY/6.9SW pen Inject 0.5 mLs (0.75 mg total) into the skin every 7 days    pioglitazone (ACTOS) 15 mg tablet TAKE 1 TABLET BY MOUTH EVERY DAY    amoxicillin (AMOXIL) 500 mg capsule Take 1 capsule (500 mg total) by mouth 2 times daily  for Simple Infection of the Urinary Tract    memantine (NAMENDA) 10 MG tablet TAKE 1 TABLET BY MOUTH TWO TIMES DAILY    ELIQUIS 5 MG tablet TAKE 1 TABLET BY MOUTH EVERY 12 HOURS    metFORMIN (GLUCOPHAGE) 500 mg tablet Take 1 tablet (500 mg total) by mouth 2 times daily (with meals)    generic DME Dispense 1 single point cane. Use as directed.     finasteride (PROSCAR) 5 mg tablet TAKE 1 TABLET BY MOUTH EVERY DAY FOR USE BY MEN ONLY    blood glucose (FREESTYLE LITE) test strip USE TO TEST BLOOD SUGAR ONE TIME DAILY    rosuvastatin (CRESTOR) 10 mg tablet TAKE 1 TABLET BY MOUTH EVERY DAY WITH DINNER    insulin pen needle (BD PEN NEEDLE NANO 2ND GEN) 32G X 4 MM Use 1 time per week.    blood glucose monitor (FREESTYLE LITE) kit TEST AS DIRECTED    Lancets Micro Thin 33G MISC Use as directed once daily. Dispense lancets for one touch verio    blood glucose monitor w/Device KIT Test as directed    Sacubitril-Valsartan (ENTRESTO) 24-26 MG per tablet Take 1 tablet by mouth 2 times daily    Coenzyme Q10 200 MG capsule Take 200 mg by mouth daily    Omega-3 Fatty Acids (FISH OIL) 1000 MG CAPS capsule Take 1 g by  mouth daily    metoprolol (TOPROL-XL) 25 MG 24 hr tablet Take 25 mg by mouth every 12 hours    dofetilide (TIKOSYN) 250 MCG capsule Take 250 mcg by mouth 2 times daily    lancets (ONETOUCH ULTRASOFT) Use   2  times per day as instructed for blood glucose testing.    Blood Glucose Calibration (OT ULTRA/FASTTK CNTRL SOLN) SOLN As directed    CINNAMON PO Take by mouth    Multiple Vitamins-Minerals (MULTI VITAMIN/MINERALS PO) Take by mouth    cholecalciferol (VITAMIN D) 1000 UNIT tablet Take 2,000 Units by mouth daily        acetaminophen (TYLENOL) 500 MG tablet Take 500 mg by mouth 2 times daily.     Allergies - no change  ROS - no change    Physical Exam :   Ht 1.791 m (5' 10.5")    Wt 86.2 kg (190 lb)    BMI 26.88 kg/m   General appearance - well appearing, nad  Back - no cvat bilaterally  Abdomen - soft, no masses, no hepatosplenomegaly, non-distended bladder, no inguinal hernia, no tenderness  Lymph nodes - no lad  Extremities/pulses -nl  Genitourinary - Male -  Penis - deferred    Recent Results (from the past 24 hour(s))   POCT urinalysis dipstick    Collection Time: 01/17/22 11:32 AM   Result Value Ref Range    Specific gravity,UA POCT  1.020 1.002 - 1.030    PH,UA POCT 5.0 5 - 8    Leuk Esterase,UA POCT Negative Negative    Nitrite,UA POCT Negative Negative    Protein,UA POCT Negative Negative mg/dL    Glucose,UA POCT 1000 mg/dL (!) Normal mg/dL    Ketones,UA POCT Negative Negative mg/dL    Urobilinogen,UA Test Not Performed Less than 1 mg/dL    Bilirubin,Ur Test Not Performed Negative, Test Not Performed    Blood,UA POCT Negative Negative    Exp date 09/15/22     Lot # [id number]      Diagnosis established -BPH with LUTS,  pt's memory issues and h/o vascular dementia currently on every day myrbetriq; LUTs improved with lower HA1c    Plan of care -  Continue myrbetriq 15m daily   FU one year

## 2022-01-18 ENCOUNTER — Other Ambulatory Visit: Payer: Self-pay | Admitting: Primary Care

## 2022-01-18 NOTE — Telephone Encounter (Signed)
Last office Visit: 08/18/2021   Next office Visit: 01/24/2022   Recent PCP visit: Recent Visits  Date Type Provider Dept   08/18/21 Office Visit Karsten Fells, MD [facility]   Showing recent visits within past 185 days in an active department and meeting all other requirements  Future Appointments  No visits were found meeting these conditions.  Showing future appointments within next 0 days in an active department and meeting all other requirements       LABORATORY & DATA:  Renal Func  Lab Results   Component Value Date/Time    K 5.0 11/29/2021 11:37 AM    CREAT 1.41 (H) 11/29/2021 11:37 AM    [place] 49 (!) 11/29/2021 11:37 AM    CA 9.3 11/29/2021 11:37 AM    TFT  Lab Results   Component Value Date/Time    TSH 4.25 (H) 11/29/2021 11:37 AM      Lipids  Lab Results   Component Value Date/Time    CHOL 109 11/29/2021 11:37 AM    HDL 35 (L) 11/29/2021 11:37 AM    LDLC 43 11/29/2021 11:37 AM    TRIG 153 (!) 11/29/2021 11:37 AM    A1c  Lab Results   Component Value Date/Time    HA1C 6.8 (H) 11/29/2021 11:37 AM    HA1C 6.3 (H) 12/23/2020 01:43 PM    HA1C 7.1 (H) 02/17/2020 10:44 AM      LFTs  Lab Results   Component Value Date/Time    AST 20 11/29/2021 11:37 AM    ALT 18 11/29/2021 11:37 AM    PT/INR  Lab Results   Component Value Date/Time    INR 2.1 (H) 01/19/2017 01:05 AM    INR 2.0 (H) 01/16/2017 09:59 AM    INR 1.97 (H) 12/28/2016 11:48 AM    PTI 24.4 (H) 01/19/2017 01:05 AM    PTI 23.0 (H) 01/16/2017 09:59 AM    PTI 20.9 (H) 12/28/2016 11:48 AM      WBC  Lab Results   Component Value Date/Time    WBC 7.4 11/29/2021 11:37 AM    HGB 11.8 (L) 11/29/2021 11:37 AM    HCT 38 (L) 11/29/2021 11:37 AM    PLT 195 11/29/2021 11:37 AM    Inflammatory Markers  Lab Results   Component Value Date/Time    ESR 10 04/29/2013 12:43 PM    CRP <1 12/23/2018 08:30 AM      UCDS: No results found for: [id number]

## 2022-01-24 ENCOUNTER — Other Ambulatory Visit: Payer: Self-pay

## 2022-01-24 ENCOUNTER — Encounter: Payer: Self-pay | Admitting: Primary Care

## 2022-01-24 ENCOUNTER — Ambulatory Visit: Payer: Medicare (Managed Care) | Admitting: Primary Care

## 2022-01-24 VITALS — BP 90/58 | HR 70 | Ht 69.06 in | Wt 180.0 lb

## 2022-01-24 DIAGNOSIS — I480 Paroxysmal atrial fibrillation: Secondary | ICD-10-CM

## 2022-01-24 DIAGNOSIS — Z0000 Encounter for general adult medical examination without abnormal findings: Secondary | ICD-10-CM

## 2022-01-24 DIAGNOSIS — E1169 Type 2 diabetes mellitus with other specified complication: Secondary | ICD-10-CM

## 2022-01-24 MED ORDER — FREESTYLE LITE TEST VI STRP *A*
ORAL_STRIP | 6 refills | Status: DC
Start: 2022-01-24 — End: 2022-01-25

## 2022-01-24 MED ORDER — OZEMPIC (0.25 OR 0.5 MG/DOSE) 2 MG/1.5ML SC SOPN
0.2500 mg | PEN_INJECTOR | SUBCUTANEOUS | 3 refills | Status: AC
Start: 2022-01-24 — End: 2022-04-24

## 2022-01-24 NOTE — Progress Notes (Signed)
Medicare Wellness Visit Note    Visit performed as:      {TIP AWV by telephone is covered by [facility] [employer] and [employer]. AWV is not covered by telephone for other payers.:28549:::1}       Office Visit, met with patient in person    Today we reviewed and updated Jon [name]'s smoking status, activities of daily living, depression screen, fall risk, medications and allergies.   I have counseled the patient in the above areas.     Subjective:     Chief Complaint: Jon [name] is a 88 y.o. male here for a/an Subsequent Annual Medicare Visit (Cpe)    In general, Jon [name] rates their overall health as:  {DESC; POOR/FAIR/GOOD/EXCELLENT:19665}      Patient Care Team:  Tillie Rung, MD as PCP - General (Primary Care)  Scheryl Marten, MD (Ophthalmology)     Current Outpatient Medications on File Prior to Visit   Medication Sig Dispense Refill   . empagliflozin (JARDIANCE) 10 mg tablet Take 10 mg by mouth every morning     . finasteride (PROSCAR) 5 mg tablet TAKE 1 TABLET BY MOUTH EVERY DAY FOR USE BY MEN ONLY 90 tablet 3   . mirabegron (MYRBETRIQ) 50 mg 24 hr tablet Take 1 tablet (50 mg total) by mouth daily 90 tablet 3   . pioglitazone (ACTOS) 15 mg tablet TAKE 1 TABLET BY MOUTH EVERY DAY 90 tablet 3   . memantine (NAMENDA) 10 MG tablet TAKE 1 TABLET BY MOUTH TWO TIMES DAILY 180 tablet 3   . ELIQUIS 5 MG tablet TAKE 1 TABLET BY MOUTH EVERY 12 HOURS 180 tablet 1   . metFORMIN (GLUCOPHAGE) 500 mg tablet Take 1 tablet (500 mg total) by mouth 2 times daily (with meals) 180 tablet 3   . generic DME Dispense 1 single point cane. Use as directed. 1 each 0   . blood glucose (FREESTYLE LITE) test strip USE TO TEST BLOOD SUGAR ONE TIME DAILY 50 each 6   . rosuvastatin (CRESTOR) 10 mg tablet TAKE 1 TABLET BY MOUTH EVERY DAY WITH DINNER 90 tablet 3   . insulin pen needle (BD PEN NEEDLE NANO 2ND GEN) 32G X 4 MM Use 1 time per week. 30 each 2   . blood glucose monitor (FREESTYLE LITE) kit  TEST AS DIRECTED     . Lancets Micro Thin 33G MISC Use as directed once daily. Dispense lancets for one touch verio 100 each 5   . blood glucose monitor w/Device KIT Test as directed 1 kit 0   . Sacubitril-Valsartan (ENTRESTO) 24-26 MG per tablet Take 1 tablet by mouth 2 times daily     . Coenzyme Q10 200 MG capsule Take 200 mg by mouth daily     . Omega-3 Fatty Acids (FISH OIL) 1000 MG CAPS capsule Take 1 g by mouth daily     . metoprolol (TOPROL-XL) 25 MG 24 hr tablet Take 25 mg by mouth every 12 hours     . dofetilide (TIKOSYN) 250 MCG capsule Take 250 mcg by mouth 2 times daily     . lancets (ONETOUCH ULTRASOFT) Use   2  times per day as instructed for blood glucose testing. 100 each 3   . Blood Glucose Calibration (OT ULTRA/FASTTK CNTRL SOLN) SOLN As directed 1 each 5   . CINNAMON PO Take by mouth     . Multiple Vitamins-Minerals (MULTI VITAMIN/MINERALS PO) Take by mouth     .  cholecalciferol (VITAMIN D) 1000 UNIT tablet Take 2,000 Units by mouth daily         . amoxicillin (AMOXIL) 500 mg capsule Take 1 capsule (500 mg total) by mouth 2 times daily  for Simple Infection of the Urinary Tract 10 capsule 0   . acetaminophen (TYLENOL) 500 MG tablet Take 500 mg by mouth 2 times daily.       No current facility-administered medications on file prior to visit.     Allergies   Allergen Reactions   . Bee Venom Other (See Comments)     Red and swelling   . No Known Latex Allergy      Created by Conversion - 0;    . Insects [Other] Other (See Comments)     Red and swelling     Patient Active Problem List    Diagnosis Date Noted   . CHF (congestive heart failure) 11/20/2016     Priority: High   . Paroxysmal atrial fibrillation 11/20/2016     Priority: High   . Cardiomyopathy 11/12/2016     Priority: High   . Protein S deficiency 11/12/2016     Priority: High   . Type 2 diabetes mellitus with other specified complication 09/06/2004     Priority: High     Created by Conversion       . Chronic anticoagulation 11/12/2016      Priority: Medium   . Vascular dementia 11/12/2016     Priority: Medium   . BPH (benign prostatic hyperplasia) 08/07/2011     Priority: Medium   . Paget's Disease 09/06/2004     Priority: Medium     Created by Conversion       . Lower urinary tract symptoms (LUTS) 09/08/2014     Priority: Low   . Opiate analgesic contract exists 09/09/2013     Priority: Low   . Penile lesion 08/12/2012     Priority: Low   . Urgency of urination 08/07/2011     Priority: Low   . Obesity 09/06/2004     Priority: Low     Created by Conversion       . Cognitive decline 11/10/2019   . Ascending aorta dilatation 04/08/2018     3.9cm on 11/25/17; needs yearly surveillance     . Chronic low back pain 09/23/2017     Past Medical History:   Diagnosis Date   . Arthritis    . BPH (benign prostatic hypertrophy)    . CHF (congestive heart failure)    . Clotting disorder    . Dementia     mild   . Diabetes mellitus    . Paget's disease of bone    . Palpitations    . Protein S deficiency     On chronic anticoagulation   . Sinus bradycardia    . Varicella      Past Surgical History:   Procedure Laterality Date   . APPENDECTOMY     . CHOLECYSTECTOMY     . EYE SURGERY     . GALLBLADDER SURGERY     . JOINT REPLACEMENT     . KNEE REPLACEMENT     . SKIN GRAFT     . TONSILLECTOMY       Family History   Problem Relation Age of Onset   . BRCA 1/2 Brother    . Arthritis Mother    . Heart Disease Mother    . High Blood Pressure Mother    .  Heart Disease Father    . Stroke Father    . Depression Other    . Diabetes Other    . Heart failure Other    . Heart Disease Other    . High Blood Pressure Other    . Kidney Disease Other      Social History     Socioeconomic History   . Marital status: Married   Occupational History   . Occupation: Former Counsellor, used to work night shift   Tobacco Use   . Smoking status: Never   . Smokeless tobacco: Never   Substance and Sexual Activity   . Alcohol use: No   . Drug use: No   . Sexual activity: Never   Social History  Narrative    2 daughters with neurological issues       Objective:     Vital Signs: BP 90/58   Pulse 70   Ht 1.754 m (5' 9.06")   Wt 81.6 kg (180 lb)   SpO2 97%   BMI 26.54 kg/m    BMI: Body mass index is 26.54 kg/m.    Vision Screening Results (Welcome visit only):  No results found.    Depression Screening Results:  Recent Review Flowsheet Data     PHQ Scores 01/24/2022 01/09/2019 12/23/2017 11/07/2016    PSQ2 Q1 - Interest/Pleasure - N N N    PSQ2 Q2 - Down, Depressed, Hopeless - N N N    PHQ Calculated Score 0 - - -        Opioid Use/DAST- 10 Screening Results:   How many times in the past year have you used an illegal drug or used a prescription medication for nonmedical reasons?: 0 (01/24/2022 10:54 AM)    Activities of Daily Living/Functional Screening Results:  Is the person deaf or does he/she have serious difficulty hearing?: N  Is this person blind or does he/she have serious difficulty seeing even when wearing glasses?: N  *Vision Status: No impairment  Does this person have serious difficulty walking or climbing stairs?: Y (use railing for help)  *Walks in Home: Independent  *Climbing Stairs: Independent  Does this person have difficulty dressing or bathing?: N  *Shopping: Independent  *House Keeping: Needs Assistance (wife helps)  *Managing Own Medications: Needs Assistance (wife)  *Handling Finances: Independent  Difficulty doing errands due to a physicial, mental or emotional condition: No  Difficulty remembering or making decisions due to a physicial, mental or emotional condition: No      Fall Risk Screening Results:  Have you fallen in the last year?: Yes (fell out of bed)  Did you sustain an injury which required medical attention?: No (did not seek medical treatment)  Do you feel you are at risk for falling?: Yes  Do you feel your risk of falling is: : Moderate  Would you like to learn more about how to reduce your risk of falling?: No      Assessment and Plan:     Cognitive Function:  Recall of  recent and remote events appears:  {NORMAL - DEFAULT:29247::"Normal"}      Advanced Care Planning:  {Advanced Care Planning :21018011}    The following health maintenance plan was reviewed with the patient:    Health Maintenance Topics with due status: Overdue       Topic Date Due    IMM DTaP/Tdap/Td Never done    IMM-ZOSTER Never done    COVID-19 Vaccine 05/08/2020     Health Maintenance Topics with  due status: Due Soon       Topic Date Due    Diabetic A1C Monitoring Other 02/27/2022     Health Maintenance Topics with due status: Postponed       Topic Postponed Until    Diabetic Nephropathy Screening on ACE/ARB 07/23/2022 (Originally 12/23/2021)    HIV Screening USPSTF/[place] 10/22/2029 (Originally 03/09/1947)     Health Maintenance Topics with due status: Not Due       Topic Last Completion Date    Diabetic Eye Exam ADA 06/15/2020    Diabetic Foot Exam ADA 06/22/2021    DEPRESSION SCREEN YEARLY 01/24/2022    Fall Risk Screening 01/24/2022     Health Maintenance Topics with due status: Completed       Topic Last Completion Date    IMM Pneumo: 65+ Years 05/18/2016    IMM-INFLUENZA 09/26/2021     This health maintenance schedule, identified risks, a list of orders placed today and patient goals have been provided to Jon [name] in the after visit summary.     Plan for any concerns identified during screening or risk assessments:  {NA:32542::"n/a"}

## 2022-01-24 NOTE — Patient Instructions (Signed)
Thank you for completing your Subsequent Annual Medicare Visit (Cpe)   with [place] today.     The purpose of this visits was to:     Screen for disease   Assess risk of future medical problems   Help develop a healthy lifestyle   Update vaccines   Get to know your doctor in case of an illness    Patient Care Team:  Karsten Fells, MD as PCP - General (Primary Care)  Corey Skains, MD (Ophthalmology)     Medicare 5 Year Plan    The following items were identified as areas of concern during your screening today:  Diabetes - This is a risk factor for Heart Attack, Stroke, Kidney Problems, Eye Problems and a loss of feeling in your fingers and feet.   BMI greater than 25 - This is a risk for Heart Attack, Stroke, High Blood Pressure, Diabetes, High Cholesterol and other complications.       The Health Maintenance table below identifies screening tests and immunizations recommended by your health care team:  Health Maintenance: These screening recommendations are based on USPSTF, [employer], and [place] state guidelines   Topic Date Due    DTaP/Tdap/Td Vaccines (1 - Tdap) Never done    Shingles Vaccine (1 of 2) Never done    COVID-19 Vaccine (3 - Booster for Pfizer series) 05/08/2020    Diabetic A1C Monitoring  02/27/2022    Diabetic Kidney Disease  07/23/2022 (Originally 12/23/2021)    HIV Screening  10/22/2029 (Originally 03/09/1947)    Diabetic Eye Exam  06/15/2022    Diabetic Foot Exam  06/22/2022    DEPRESSION SCREEN YEARLY  01/24/2023    Fall Risk Screening  01/24/2023    Flu Shot  Completed    Pneumococcal Vaccination  Completed     In addition, goals and orders placed to address these recommendations are listed in the "Today's Visit" section.    We wish you the best of health and look forward to seeing you again next year for your Annual Medicare Wellness Visit.     If you have any health care concerns before then, please do not hesitate to contact us.

## 2022-01-25 MED ORDER — BLOOD GLUCOSE TEST VI STRP *A*
1.0000 | ORAL_STRIP | Freq: Every day | 3 refills | Status: AC
Start: 2022-01-25 — End: 2022-04-25

## 2022-01-25 NOTE — Progress Notes (Signed)
[facility]  Physical Exam Visit    Jon [name] is 88 y.o. male presenting for a full physical exam.     Specific Concern's today also include:     One episode of dizziness, with standing playing golf. Sat down and felt better. Has not recurred.     DM2  Adherent to medications  Has not been checking BGs  ~~~    Patient has not been hospitalized in the past year.    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    Type 2 diabetes mellitus with other specified complication U76.54    Obesity E66.9    Urgency of urination R39.15    BPH (benign prostatic hyperplasia) N40.0    Penile lesion N48.9    Opiate analgesic contract exists Z79.891    Lower urinary tract symptoms (LUTS) R39.9    CHF (congestive heart failure) I50.9    Paroxysmal atrial fibrillation I48.0    Cardiomyopathy I42.9    Chronic anticoagulation Z79.01    Protein S deficiency D68.59    Vascular dementia F01.50    Chronic low back pain M54.50, G89.29    Ascending aorta dilatation I77.810    Cognitive decline R41.89       Current Outpatient Medications   Medication Sig    empagliflozin (JARDIANCE) 10 mg tablet Take 10 mg by mouth every morning    blood glucose (FREESTYLE LITE) test strip USE TO TEST BLOOD SUGAR ONE TIME DAILY    finasteride (PROSCAR) 5 mg tablet TAKE 1 TABLET BY MOUTH EVERY DAY FOR USE BY MEN ONLY    mirabegron (MYRBETRIQ) 50 mg 24 hr tablet Take 1 tablet (50 mg total) by mouth daily    pioglitazone (ACTOS) 15 mg tablet TAKE 1 TABLET BY MOUTH EVERY DAY    memantine (NAMENDA) 10 MG tablet TAKE 1 TABLET BY MOUTH TWO TIMES DAILY    ELIQUIS 5 MG tablet TAKE 1 TABLET BY MOUTH EVERY 12 HOURS     metFORMIN (GLUCOPHAGE) 500 mg tablet Take 1 tablet (500 mg total) by mouth 2 times daily (with meals)    generic DME Dispense 1 single point cane. Use as directed.    rosuvastatin (CRESTOR) 10 mg tablet TAKE 1 TABLET BY MOUTH EVERY DAY WITH DINNER    insulin pen needle (BD PEN NEEDLE NANO 2ND GEN) 32G X 4 MM Use 1 time per week.    blood glucose monitor (FREESTYLE LITE) kit TEST AS DIRECTED    Lancets Micro Thin 33G MISC Use as directed once daily. Dispense lancets for one touch verio    blood glucose monitor w/Device KIT Test as directed    Sacubitril-Valsartan (ENTRESTO) 24-26 MG per tablet Take 1 tablet by mouth 2 times daily    Coenzyme Q10 200 MG capsule Take 200 mg by mouth daily    Omega-3 Fatty Acids (FISH OIL) 1000 MG CAPS capsule Take 1 g by mouth daily    metoprolol (TOPROL-XL) 25 MG 24 hr tablet Take 25 mg by mouth every 12 hours    dofetilide (TIKOSYN) 250 MCG capsule Take 250 mcg by  mouth 2 times daily    lancets (ONETOUCH ULTRASOFT) Use   2  times per day as instructed for blood glucose testing.    Blood Glucose Calibration (OT ULTRA/FASTTK CNTRL SOLN) SOLN As directed    CINNAMON PO Take by mouth    Multiple Vitamins-Minerals (MULTI VITAMIN/MINERALS PO) Take by mouth    cholecalciferol (VITAMIN D) 1000 UNIT tablet Take 2,000 Units by mouth daily        OZEMPIC, 0.25 OR 0.5 MG/DOSE, pen Inject 0.19 mLs (0.25 mg total) into the skin once a week    amoxicillin (AMOXIL) 500 mg capsule Take 1 capsule (500 mg total) by mouth 2 times daily  for Simple Infection of the Urinary Tract    acetaminophen (TYLENOL) 500 MG tablet Take 500 mg by mouth 2 times daily.       Allergies   Allergen Reactions    Bee Venom Other (See Comments)     Red and swelling    No Known Latex Allergy      Created by Conversion - 0;     Insects [Other] Other (See Comments)     Red and swelling       Past Medical History:   Diagnosis Date    Arthritis     BPH (benign prostatic hypertrophy)     CHF (congestive  heart failure)     Clotting disorder     Dementia     mild    Diabetes mellitus     Paget's disease of bone     Palpitations     Protein S deficiency     On chronic anticoagulation    Sinus bradycardia     Varicella        Past Surgical History:   Procedure Laterality Date    APPENDECTOMY      CHOLECYSTECTOMY      EYE SURGERY      GALLBLADDER SURGERY      JOINT REPLACEMENT      KNEE REPLACEMENT      SKIN GRAFT      TONSILLECTOMY         Social History     Socioeconomic History    Marital status: Married   Occupational History    Occupation: Former Personal assistant, used to work night shift   Tobacco Use    Smoking status: Never    Smokeless tobacco: Never   Substance and Sexual Activity    Alcohol use: No    Drug use: No    Sexual activity: Never   Social History Narrative    2 daughters with neurological issues         Family History   Problem Relation Age of Onset    BRCA 1/2 Brother     Arthritis Mother     Heart Disease Mother     High Blood Pressure Mother     Heart Disease Father     Stroke Father     Depression Other     Diabetes Other     Heart failure Other     Heart Disease Other     High Blood Pressure Other     Kidney Disease Other             Domestic Violence Screening   Negative  _0      Positive - Intimate Partner  _1  Other  _2  Remote Hx  _3    Depression Screening   PHQ - 9 Score  =    PHQ2  PHQ 01/24/2022  Over the last two weeks, have you often been bothered by feeling down, depressed or hopeless? -   Over the last two weeks, have you often had little interest or pleasure in doing things? -   PHQ Total Score 0        N/A - not indicated  _0        Aging Concerns   Trouble Hearing - No  _1  Yes  _2  Details:   Memory Concerns - No  _3  Yes  _4  Details:   Fall in past year - No  _5  Yes  _6  Details:   Worsening Balance - No  _7  Yes  _8  Details:   Environmental Safety   - Discussed sunscreen, seatbelts, cycling helmet, smoke detectors, CO detectors, firearms  Notes:         Health Maintenance Topics with due status: Overdue       Topic Date Due    COVID-19 Vaccine 05/08/2020     Health Maintenance Topics with due status: Due Soon       Topic Date Due    Diabetic A1C Monitoring Other 02/27/2022     Health Maintenance Topics with due status: Postponed       Topic Postponed Until    Diabetic Nephropathy Screening on ACE/ARB 07/23/2022 (Originally 12/23/2021)    IMM-ZOSTER 01/25/2023 (Originally 03/09/1986)    IMM DTaP/Tdap/Td 01/25/2023 (Originally 03/10/1953)    HIV Screening USPSTF/[place] 10/22/2029 (Originally 03/09/1947)     Health Maintenance Topics with due status: Not Due       Topic Last Completion Date    Diabetic Eye Exam ADA 06/15/2020    Diabetic Foot Exam ADA 06/22/2021    DEPRESSION SCREEN YEARLY 01/24/2022    Fall Risk Screening 01/24/2022     Health Maintenance Topics with due status: Completed       Topic Last Completion Date    IMM Pneumo: 65+ Years 05/18/2016    IMM-INFLUENZA 09/26/2021       PHYSICAL EXAM   BP 90/58    Pulse 70    Ht 1.754 m (5' 9.06")    Wt 81.6 kg (180 lb)    SpO2 97%    BMI 26.54 kg/m     GEN: Alert, pleasant well adult in NAD.   HEENT: Normocephalic and atraumatic.PERRL. Moist mucous membranes. TM's clear BL  NECK: Supple, no lymphadenopathy or thyromegaly   PULM: Easy respirations, well aerated, CTA bilaterally.   CVS: RRR, no murmur, normal S1& S2. Equal and strong radial pulses.   ABD:  soft, nontender, nondistended, no hepatosplenomegaly, no masses.   SKIN: No rashes or concerning lesions    NEURO: steady gait  Ext: no clubbing, edema or cyanosis       Assessment/Plan   1. Preventative health care    2. Type 2 diabetes mellitus with other specified complication, without long-term current use of insulin  HbA1c was last checked on 11/29/2021 and was 6.8% which is at goal of <8. Per our records the patient is UTD on diabetic eye exam. Patient is UTD on diabetic foot exam. Patient is prescribed ARB for prevention of diabetic nephropathy.  Patient is  prescribed statin to reduce risk of cardiovascular events.        Diabetic medication regimen as of TODAY is as follows:   Current Diabetes Medications             empagliflozin (JARDIANCE) 10 mg tablet Take 10 mg by mouth every morning    pioglitazone (ACTOS) 15 mg tablet  TAKE 1 TABLET BY MOUTH EVERY DAY    metFORMIN (GLUCOPHAGE) 500 mg tablet Take 1 tablet (500 mg total) by mouth 2 times daily (with meals)    OZEMPIC, 0.25 OR 0.5 MG/DOSE, pen Inject 0.19 mLs (0.25 mg total) into the skin once a week        3. Atrial fibrillation  Continue current medication  Encourage frequent po fluids, if dizziness recurs, contact cardiology to discuss medications. BP 90s/50s today.          Care Planning    Health Care Proxy:   Paperwork as part of Record? _0   Yes   _1    No  _2   Awaiting return of forms    Discussed Relevant Health Education Topics Listed Below:   x Importance of recommended health screenings   x Importance of recommended vaccines     PSA/Prostate Cancer   x Bone Health/Calcium and Vitamin D   x Appropriate Weight/Weight loss goals   x Regular Exercise    Smoking Cessation   x Healthcare proxy    x Living Will    Stress/Family issue     Follow up in about 6 months (around 07/24/2022) for Follow-up HTN.    Georgiann Mccoy, MD, MPH  [facility]  Dept [facility]

## 2022-02-02 ENCOUNTER — Other Ambulatory Visit: Payer: Self-pay | Admitting: Primary Care

## 2022-02-02 NOTE — Telephone Encounter (Signed)
Last office Visit: 01/24/2022   Next office Visit: Visit date not found   Recent PCP visit: Recent Visits  Date Type Provider Dept   01/24/22 Office Visit Karsten Fells, MD [facility]   08/18/21 Office Visit Karsten Fells, MD [facility]   Showing recent visits within past 185 days in an active department and meeting all other requirements  Future Appointments  No visits were found meeting these conditions.  Showing future appointments within next 0 days in an active department and meeting all other requirements       LABORATORY & DATA:  Renal Func  Lab Results   Component Value Date/Time    K 5.0 11/29/2021 11:37 AM    CREAT 1.41 (H) 11/29/2021 11:37 AM    [place] 49 (!) 11/29/2021 11:37 AM    CA 9.3 11/29/2021 11:37 AM    TFT  Lab Results   Component Value Date/Time    TSH 4.25 (H) 11/29/2021 11:37 AM      Lipids  Lab Results   Component Value Date/Time    CHOL 109 11/29/2021 11:37 AM    HDL 35 (L) 11/29/2021 11:37 AM    LDLC 43 11/29/2021 11:37 AM    TRIG 153 (!) 11/29/2021 11:37 AM    A1c  Lab Results   Component Value Date/Time    HA1C 6.8 (H) 11/29/2021 11:37 AM    HA1C 6.3 (H) 12/23/2020 01:43 PM    HA1C 7.1 (H) 02/17/2020 10:44 AM      LFTs  Lab Results   Component Value Date/Time    AST 20 11/29/2021 11:37 AM    ALT 18 11/29/2021 11:37 AM    PT/INR  Lab Results   Component Value Date/Time    INR 2.1 (H) 01/19/2017 01:05 AM    INR 2.0 (H) 01/16/2017 09:59 AM    INR 1.97 (H) 12/28/2016 11:48 AM    PTI 24.4 (H) 01/19/2017 01:05 AM    PTI 23.0 (H) 01/16/2017 09:59 AM    PTI 20.9 (H) 12/28/2016 11:48 AM      WBC  Lab Results   Component Value Date/Time    WBC 7.4 11/29/2021 11:37 AM    HGB 11.8 (L) 11/29/2021 11:37 AM    HCT 38 (L) 11/29/2021 11:37 AM    PLT 195 11/29/2021 11:37 AM    Inflammatory Markers  Lab Results   Component Value Date/Time    ESR 10 04/29/2013 12:43 PM    CRP <1 12/23/2018 08:30 AM      UCDS: No results found for: [id number]

## 2022-02-21 ENCOUNTER — Telehealth: Payer: Self-pay | Admitting: Urology

## 2022-02-21 NOTE — Telephone Encounter (Signed)
See My Chart Message.

## 2022-02-28 ENCOUNTER — Encounter: Payer: Self-pay | Admitting: Urology

## 2022-04-20 ENCOUNTER — Encounter: Payer: Self-pay | Admitting: Gastroenterology

## 2022-05-03 ENCOUNTER — Other Ambulatory Visit: Payer: Self-pay | Admitting: Primary Care

## 2022-05-03 MED ORDER — APIXABAN 5 MG PO TABS *I*
5.0000 mg | ORAL_TABLET | Freq: Two times a day (BID) | ORAL | 3 refills | Status: DC
Start: 2022-05-03 — End: 2022-07-31

## 2022-05-03 NOTE — Telephone Encounter (Signed)
Last office Visit: 01/24/2022   Next office Visit: 07/27/2022   Recent PCP visit: Recent Visits  Date Type Provider Dept   01/24/22 Office Visit Tillie Rung, MD [facility]   Showing recent visits within past 185 days in an active department and meeting all other requirements  Future Appointments  No visits were found meeting these conditions.  Showing future appointments within next 0 days in an active department and meeting all other requirements       LABORATORY & DATA:  Renal Func  Lab Results   Component Value Date/Time    K 5.0 11/29/2021 11:37 AM    CREAT 1.41 (H) 11/29/2021 11:37 AM    RFGRC 49 (!) 11/29/2021 11:37 AM    CA 9.3 11/29/2021 11:37 AM    TFT  Lab Results   Component Value Date/Time    TSH 4.25 (H) 11/29/2021 11:37 AM      Lipids  Lab Results   Component Value Date/Time    CHOL 109 11/29/2021 11:37 AM    HDL 35 (L) 11/29/2021 11:37 AM    LDLC 43 11/29/2021 11:37 AM    TRIG 153 (!) 11/29/2021 11:37 AM    A1c  Lab Results   Component Value Date/Time    HA1C 6.8 (H) 11/29/2021 11:37 AM    HA1C 6.3 (H) 12/23/2020 01:43 PM    HA1C 7.1 (H) 02/17/2020 10:44 AM      LFTs  Lab Results   Component Value Date/Time    AST 20 11/29/2021 11:37 AM    ALT 18 11/29/2021 11:37 AM    PT/INR  Lab Results   Component Value Date/Time    INR 2.1 (H) 01/19/2017 01:05 AM    INR 2.0 (H) 01/16/2017 09:59 AM    INR 1.97 (H) 12/28/2016 11:48 AM    PTI 24.4 (H) 01/19/2017 01:05 AM    PTI 23.0 (H) 01/16/2017 09:59 AM    PTI 20.9 (H) 12/28/2016 11:48 AM      WBC  Lab Results   Component Value Date/Time    WBC 7.4 11/29/2021 11:37 AM    HGB 11.8 (L) 11/29/2021 11:37 AM    HCT 38 (L) 11/29/2021 11:37 AM    PLT 195 11/29/2021 11:37 AM    Inflammatory Markers  Lab Results   Component Value Date/Time    ESR 10 04/29/2013 12:43 PM    CRP <1 12/23/2018 08:30 AM      UCDS: No results found for: [id number]

## 2022-06-02 ENCOUNTER — Other Ambulatory Visit: Payer: Self-pay | Admitting: Primary Care

## 2022-06-02 DIAGNOSIS — E1169 Type 2 diabetes mellitus with other specified complication: Secondary | ICD-10-CM

## 2022-06-05 ENCOUNTER — Telehealth: Payer: Self-pay | Admitting: Primary Care

## 2022-06-05 NOTE — Telephone Encounter (Signed)
Received fax from pharmacy to refill Ozempic 0.25-0.5mg  dose pen.    Last office Visit: 06/02/2022   Next office Visit: 07/27/2022   Recent PCP visit: Recent Visits  Date Type Provider Dept   01/24/22 Office Visit Jon Rung, MD [facility]   Showing recent visits within past 185 days in an active department and meeting all other requirements  Future Appointments  No visits were found meeting these conditions.  Showing future appointments within next 0 days in an active department and meeting all other requirements       LABORATORY & DATA:  Renal Func  Lab Results   Component Value Date/Time    K 5.0 11/29/2021 11:37 AM    CREAT 1.41 (H) 11/29/2021 11:37 AM    RFGRC 49 (!) 11/29/2021 11:37 AM    CA 9.3 11/29/2021 11:37 AM    TFT  Lab Results   Component Value Date/Time    TSH 4.25 (H) 11/29/2021 11:37 AM      Lipids  Lab Results   Component Value Date/Time    CHOL 109 11/29/2021 11:37 AM    HDL 35 (L) 11/29/2021 11:37 AM    LDLC 43 11/29/2021 11:37 AM    TRIG 153 (!) 11/29/2021 11:37 AM    A1c  Lab Results   Component Value Date/Time    HA1C 6.8 (H) 11/29/2021 11:37 AM    HA1C 6.3 (H) 12/23/2020 01:43 PM    HA1C 7.1 (H) 02/17/2020 10:44 AM      LFTs  Lab Results   Component Value Date/Time    AST 20 11/29/2021 11:37 AM    ALT 18 11/29/2021 11:37 AM    PT/INR  Lab Results   Component Value Date/Time    INR 2.1 (H) 01/19/2017 01:05 AM    INR 2.0 (H) 01/16/2017 09:59 AM    INR 1.97 (H) 12/28/2016 11:48 AM    PTI 24.4 (H) 01/19/2017 01:05 AM    PTI 23.0 (H) 01/16/2017 09:59 AM    PTI 20.9 (H) 12/28/2016 11:48 AM      WBC  Lab Results   Component Value Date/Time    WBC 7.4 11/29/2021 11:37 AM    HGB 11.8 (L) 11/29/2021 11:37 AM    HCT 38 (L) 11/29/2021 11:37 AM    PLT 195 11/29/2021 11:37 AM    Inflammatory Markers  Lab Results   Component Value Date/Time    ESR 10 04/29/2013 12:43 PM    CRP <1 12/23/2018 08:30 AM      UCDS: No results found for: [id number]

## 2022-06-05 NOTE — Telephone Encounter (Signed)
Last office Visit: 05/03/2022   Next office Visit: 07/27/2022   Recent PCP visit: Recent Visits  Date Type Provider Dept   01/24/22 Office Visit Tillie Rung, MD [facility]   Showing recent visits within past 185 days in an active department and meeting all other requirements  Future Appointments  No visits were found meeting these conditions.  Showing future appointments within next 0 days in an active department and meeting all other requirements       LABORATORY & DATA:  Renal Func  Lab Results   Component Value Date/Time    K 5.0 11/29/2021 11:37 AM    CREAT 1.41 (H) 11/29/2021 11:37 AM    RFGRC 49 (!) 11/29/2021 11:37 AM    CA 9.3 11/29/2021 11:37 AM    TFT  Lab Results   Component Value Date/Time    TSH 4.25 (H) 11/29/2021 11:37 AM      Lipids  Lab Results   Component Value Date/Time    CHOL 109 11/29/2021 11:37 AM    HDL 35 (L) 11/29/2021 11:37 AM    LDLC 43 11/29/2021 11:37 AM    TRIG 153 (!) 11/29/2021 11:37 AM    A1c  Lab Results   Component Value Date/Time    HA1C 6.8 (H) 11/29/2021 11:37 AM    HA1C 6.3 (H) 12/23/2020 01:43 PM    HA1C 7.1 (H) 02/17/2020 10:44 AM      LFTs  Lab Results   Component Value Date/Time    AST 20 11/29/2021 11:37 AM    ALT 18 11/29/2021 11:37 AM    PT/INR  Lab Results   Component Value Date/Time    INR 2.1 (H) 01/19/2017 01:05 AM    INR 2.0 (H) 01/16/2017 09:59 AM    INR 1.97 (H) 12/28/2016 11:48 AM    PTI 24.4 (H) 01/19/2017 01:05 AM    PTI 23.0 (H) 01/16/2017 09:59 AM    PTI 20.9 (H) 12/28/2016 11:48 AM      WBC  Lab Results   Component Value Date/Time    WBC 7.4 11/29/2021 11:37 AM    HGB 11.8 (L) 11/29/2021 11:37 AM    HCT 38 (L) 11/29/2021 11:37 AM    PLT 195 11/29/2021 11:37 AM    Inflammatory Markers  Lab Results   Component Value Date/Time    ESR 10 04/29/2013 12:43 PM    CRP <1 12/23/2018 08:30 AM      UCDS: No results found for: [id number]

## 2022-06-05 NOTE — Telephone Encounter (Signed)
Duplicate request

## 2022-06-10 ENCOUNTER — Other Ambulatory Visit: Payer: Self-pay | Admitting: Endocrinology

## 2022-07-02 ENCOUNTER — Telehealth: Payer: Self-pay | Admitting: Primary Care

## 2022-07-02 NOTE — Telephone Encounter (Signed)
Jon [name] from [employer] called request a return call from a nurse because she wants to know if patient has cognitive impairment listed as one of his conditions.  She needs this clarified in order to verify his medication list and document why patient is on certain medications.    Jon [name] can be reached directly at [phone] ext [phone]

## 2022-07-02 NOTE — Telephone Encounter (Signed)
CM attempted to return call, no answer, LM with office call back information

## 2022-07-11 ENCOUNTER — Other Ambulatory Visit: Payer: Self-pay | Admitting: Endocrinology

## 2022-07-23 ENCOUNTER — Telehealth: Payer: Self-pay | Admitting: Primary Care

## 2022-07-23 ENCOUNTER — Ambulatory Visit: Payer: Medicare (Managed Care) | Admitting: Primary Care

## 2022-07-23 DIAGNOSIS — U071 COVID-19: Secondary | ICD-10-CM

## 2022-07-23 MED ORDER — MOLNUPIRAVIR 200 MG PO CAPS *I*
800.0000 mg | ORAL_CAPSULE | Freq: Two times a day (BID) | ORAL | 0 refills | Status: AC
Start: 2022-07-23 — End: 2022-07-28

## 2022-07-23 NOTE — Telephone Encounter (Signed)
Patient's dtr  called to let you know  That he tested + for Covid today and he is not feeling good, she is looking for the medication for Covid.

## 2022-07-23 NOTE — Telephone Encounter (Signed)
Patient scheduled.

## 2022-07-23 NOTE — Telephone Encounter (Signed)
TC to pt wife who states pt has congestion, cough, fatigue and no appetite. Pt denies fever. Pt requesting treatment.     Efraim Kaufmann, LPN

## 2022-07-24 ENCOUNTER — Encounter: Payer: Self-pay | Admitting: Primary Care

## 2022-07-24 NOTE — Progress Notes (Signed)
Family Medicine Telemedicine Visit Note      TeleHome Phone Encounter   This was an established patient visit.  Location of Telemedicine Provider: Clinic  Reason for visit: covid 19  Patient's problem list, allergies, and medications were reviewed and updated as appropriate. Please see the EHR for full details.  This visit was performed during a pandemic event, thus the physical exam was not performed.  The plan was discussed with the patient and the patient/patient representative demonstrated understanding to the provider's satisfaction.  Consent was previously obtained from the patient to complete this telephone consult; including the potential for financial liability.  5-10 minutes were spent on the phone with the patient, patient representatives, and/or other attendees.                 Subjective:  Jon [name] is a 88 y.o. male here for:    Patient with covid positive test today, symptoms for past 2 days  Has cough, fatigue  Denies any shortness of breath  Is drinking fluids     ROS: Per HPI    Patient's medications, allergies, and problem list were reviewed.        Current Outpatient Medications:   .  molnupiravir (LAGEVRIO) 200 mg capsule, Take 4 capsules (800 mg total) by mouth 2 times daily for 5 days  for Infection due to Human Coronavirus, Disp: 40 capsule, Rfl: 0  .  metFORMIN (GLUCOPHAGE) 500 mg tablet, TAKE 1 TABLET BY MOUTH TWO TIMES DAILY WITH MEALS, Disp: 180 tablet, Rfl: 0  .  OZEMPIC, 0.25 OR 0.5 MG/DOSE, pen, INJECT 0.5 MG INTO THE SKIN ONCE A WEEK, Disp: 3 mL, Rfl: 1  .  apixaban (ELIQUIS) 5 mg tablet, Take 1 tablet (5 mg total) by mouth every 12 hours, Disp: 180 tablet, Rfl: 3  .  empagliflozin (JARDIANCE) 10 mg tablet, Take 10 mg by mouth every morning, Disp: , Rfl:   .  finasteride (PROSCAR) 5 mg tablet, TAKE 1 TABLET BY MOUTH EVERY DAY FOR USE BY MEN ONLY, Disp: 90 tablet, Rfl: 3  .  mirabegron (MYRBETRIQ) 50 mg 24 hr tablet, Take 1 tablet (50 mg total) by mouth daily, Disp: 90 tablet,  Rfl: 3  .  pioglitazone (ACTOS) 15 mg tablet, TAKE 1 TABLET BY MOUTH EVERY DAY, Disp: 90 tablet, Rfl: 3  .  amoxicillin (AMOXIL) 500 mg capsule, Take 1 capsule (500 mg total) by mouth 2 times daily  for Simple Infection of the Urinary Tract, Disp: 10 capsule, Rfl: 0  .  memantine (NAMENDA) 10 MG tablet, TAKE 1 TABLET BY MOUTH TWO TIMES DAILY, Disp: 180 tablet, Rfl: 3  .  generic DME, Dispense 1 single point cane. Use as directed., Disp: 1 each, Rfl: 0  .  rosuvastatin (CRESTOR) 10 mg tablet, TAKE 1 TABLET BY MOUTH EVERY DAY WITH DINNER, Disp: 90 tablet, Rfl: 3  .  insulin pen needle (BD PEN NEEDLE NANO 2ND GEN) 32G X 4 MM, Use 1 time per week., Disp: 30 each, Rfl: 2  .  blood glucose monitor (FREESTYLE LITE) kit, TEST AS DIRECTED, Disp: , Rfl:   .  Lancets Micro Thin 33G MISC, Use as directed once daily. Dispense lancets for one touch verio, Disp: 100 each, Rfl: 5  .  blood glucose monitor w/Device KIT, Test as directed, Disp: 1 kit, Rfl: 0  .  Sacubitril-Valsartan (ENTRESTO) 24-26 MG per tablet, Take 1 tablet by mouth 2 times daily, Disp: , Rfl:   .  Coenzyme  Q10 200 MG capsule, Take 200 mg by mouth daily, Disp: , Rfl:   .  Omega-3 Fatty Acids (FISH OIL) 1000 MG CAPS capsule, Take 1 g by mouth daily, Disp: , Rfl:   .  metoprolol (TOPROL-XL) 25 MG 24 hr tablet, Take 25 mg by mouth every 12 hours, Disp: , Rfl:   .  dofetilide (TIKOSYN) 250 MCG capsule, Take 250 mcg by mouth 2 times daily, Disp: , Rfl:   .  lancets (ONETOUCH ULTRASOFT), Use   2  times per day as instructed for blood glucose testing., Disp: 100 each, Rfl: 3  .  Blood Glucose Calibration (OT ULTRA/FASTTK CNTRL SOLN) SOLN, As directed, Disp: 1 each, Rfl: 5  .  CINNAMON PO, Take by mouth, Disp: , Rfl:   .  Multiple Vitamins-Minerals (MULTI VITAMIN/MINERALS PO), Take by mouth, Disp: , Rfl:   .  cholecalciferol (VITAMIN D) 1000 UNIT tablet, Take 2,000 Units by mouth daily    , Disp: , Rfl:   .  acetaminophen (TYLENOL) 500 MG tablet, Take 500 mg by mouth 2  times daily., Disp: , Rfl:     Objective:  Physical exam:  Vital signs unavailable    Assessment/Plan:  1. COVID-19  - molnupiravir (LAGEVRIO) 200 mg capsule; Take 4 capsules (800 mg total) by mouth 2 times daily for 5 days  for Infection due to Human Coronavirus  Dispense: 40 capsule; Refill: 0  - keep up po fluids  - call with any worsening symptoms, monitor breathing    Follow up if symptoms worsen or fail to improve.    Rica Mast, MD MPH  Family Medicine  [facility]

## 2022-07-27 ENCOUNTER — Ambulatory Visit: Payer: Medicare (Managed Care) | Admitting: Primary Care

## 2022-07-28 ENCOUNTER — Other Ambulatory Visit: Payer: Self-pay | Admitting: Primary Care

## 2022-07-30 NOTE — Telephone Encounter (Signed)
Recent Visits  Date Type Provider Dept   07/23/22 Telemedicine Visit Tillie Rung, MD [facility]   Showing recent visits within past 185 days in an active department and meeting all other requirements  Future Appointments  No visits were found meeting these conditions.  Showing future appointments within next 0 days in an active department and meeting all other requirements    Future Encounters      07/31/2022  1:40 PM Sch    FOLLOW UP VISIT    MAP    Woz, Nelida Gores, MD              LABORATORY & DATA:  Renal Func  Lab Results   Component Value Date/Time    K 5.0 11/29/2021 11:37 AM    CREAT 1.41 (H) 11/29/2021 11:37 AM    RFGRC 49 (!) 11/29/2021 11:37 AM    CA 9.3 11/29/2021 11:37 AM    TFT  Lab Results   Component Value Date/Time    TSH 4.25 (H) 11/29/2021 11:37 AM      Lipids  Lab Results   Component Value Date/Time    CHOL 109 11/29/2021 11:37 AM    HDL 35 (L) 11/29/2021 11:37 AM    LDLC 43 11/29/2021 11:37 AM    TRIG 153 (!) 11/29/2021 11:37 AM    A1c  Lab Results   Component Value Date/Time    HA1C 6.8 (H) 11/29/2021 11:37 AM    HA1C 6.3 (H) 12/23/2020 01:43 PM    HA1C 7.1 (H) 02/17/2020 10:44 AM      LFTs  Lab Results   Component Value Date/Time    AST 20 11/29/2021 11:37 AM    ALT 18 11/29/2021 11:37 AM    PT/INR  Lab Results   Component Value Date/Time    INR 2.1 (H) 01/19/2017 01:05 AM    INR 2.0 (H) 01/16/2017 09:59 AM    INR 1.97 (H) 12/28/2016 11:48 AM    PTI 24.4 (H) 01/19/2017 01:05 AM    PTI 23.0 (H) 01/16/2017 09:59 AM    PTI 20.9 (H) 12/28/2016 11:48 AM      WBC  Lab Results   Component Value Date/Time    WBC 7.4 11/29/2021 11:37 AM    HGB 11.8 (L) 11/29/2021 11:37 AM    HCT 38 (L) 11/29/2021 11:37 AM    PLT 195 11/29/2021 11:37 AM    Inflammatory Markers  Lab Results   Component Value Date/Time    ESR 10 04/29/2013 12:43 PM    CRP <1 12/23/2018 08:30 AM      UCDS: No results found for: [id number]

## 2022-07-31 ENCOUNTER — Ambulatory Visit: Payer: Medicare (Managed Care) | Admitting: Primary Care

## 2022-07-31 ENCOUNTER — Encounter: Payer: Self-pay | Admitting: Primary Care

## 2022-07-31 ENCOUNTER — Other Ambulatory Visit: Payer: Self-pay

## 2022-07-31 VITALS — BP 100/60 | HR 76 | Ht 69.0 in | Wt 181.0 lb

## 2022-07-31 DIAGNOSIS — I480 Paroxysmal atrial fibrillation: Secondary | ICD-10-CM

## 2022-07-31 DIAGNOSIS — E7800 Pure hypercholesterolemia, unspecified: Secondary | ICD-10-CM

## 2022-07-31 DIAGNOSIS — E1169 Type 2 diabetes mellitus with other specified complication: Secondary | ICD-10-CM

## 2022-07-31 DIAGNOSIS — I509 Heart failure, unspecified: Secondary | ICD-10-CM

## 2022-07-31 DIAGNOSIS — R5381 Other malaise: Secondary | ICD-10-CM

## 2022-07-31 DIAGNOSIS — M1711 Unilateral primary osteoarthritis, right knee: Secondary | ICD-10-CM

## 2022-07-31 MED ORDER — APIXABAN 5 MG PO TABS *I*
5.0000 mg | ORAL_TABLET | Freq: Two times a day (BID) | ORAL | 3 refills | Status: DC
Start: 2022-07-31 — End: 2023-04-22

## 2022-07-31 MED ORDER — METFORMIN HCL 500 MG PO TABS *I*
500.0000 mg | ORAL_TABLET | Freq: Two times a day (BID) | ORAL | 3 refills | Status: DC
Start: 2022-07-31 — End: 2023-09-12

## 2022-07-31 MED ORDER — SACUBITRIL-VALSARTAN 24-26 MG PO TABS *I*
1.0000 | ORAL_TABLET | Freq: Two times a day (BID) | ORAL | 3 refills | Status: DC
Start: 2022-07-31 — End: 2024-02-18

## 2022-07-31 MED ORDER — OZEMPIC (0.25 OR 0.5 MG/DOSE) 2 MG/3ML SC SOPN
0.5000 mg | PEN_INJECTOR | SUBCUTANEOUS | 3 refills | Status: DC
Start: 2022-07-31 — End: 2022-09-18

## 2022-07-31 MED ORDER — EMPAGLIFLOZIN 10 MG PO TABS *I*
10.0000 mg | ORAL_TABLET | Freq: Every morning | ORAL | 3 refills | Status: AC
Start: 2022-07-31 — End: 2024-02-18

## 2022-07-31 MED ORDER — METOPROLOL SUCCINATE 25 MG PO TB24 *I*
25.0000 mg | ORAL_TABLET | Freq: Two times a day (BID) | ORAL | 3 refills | Status: AC
Start: 2022-07-31 — End: 2024-02-18

## 2022-07-31 MED ORDER — ROSUVASTATIN CALCIUM 10 MG PO TABS *I*
10.0000 mg | ORAL_TABLET | Freq: Every day | ORAL | 3 refills | Status: DC
Start: 2022-07-31 — End: 2023-08-06

## 2022-07-31 MED ORDER — PIOGLITAZONE HCL 15 MG PO TABS *I*
15.0000 mg | ORAL_TABLET | Freq: Every day | ORAL | 3 refills | Status: DC
Start: 2022-07-31 — End: 2023-08-28

## 2022-07-31 NOTE — Telephone Encounter (Signed)
Jon [name] from [employer] called requesting to speak with nurse manager.  She said that she sent [place] a fax to verify the attached but they have not yet received the fax with clarification            Please call Jon [name] at [phone]   Ext [phone]

## 2022-07-31 NOTE — Telephone Encounter (Signed)
CM attempted to return call, no answer, no voice mail. CM has no information regarding faxes received however.

## 2022-07-31 NOTE — Progress Notes (Signed)
Family Medicine Office Visit Note    Chief Complaint   Patient presents with   ? Hypertension     6 month follow up       Subjective:  Jon [name] is a 88 y.o. male here for:  Here with daughter.     Continues to feel fatigued after covid and antiviral medication    Right knee pain. Has difficulty standing up after a fall. Did fall out of bed last week. Does not have a call alert button but family has looked into this and plans to see if it is feasible. Does not want surgery but is open to other therapies to help him to continue to golf.     dm2  Adherent to medication  Denies polydipsia, polyuria. + occasional dizziness, esp if standing in heat    Afib, CHF  Adherent to medications  Adherent to statin, does not endorse muscle pain    ROS: Per HPI    Patient's medications, allergies, and problem list were reviewed.        Current Outpatient Medications:   ?  metoprolol succinate ER (TOPROL-XL) 25 mg 24 hr tablet, Take 1 tablet (25 mg total) by mouth every 12 hours, Disp: 180 tablet, Rfl: 3  ?  metFORMIN (GLUCOPHAGE) 500 mg tablet, Take 1 tablet (500 mg total) by mouth 2 times daily (with meals), Disp: 180 tablet, Rfl: 3  ?  Sacubitril-Valsartan (ENTRESTO) 24-26 mg per tablet, Take 1 tablet by mouth 2 times daily, Disp: 180 tablet, Rfl: 3  ?  apixaban (ELIQUIS) 5 mg tablet, Take 1 tablet (5 mg total) by mouth every 12 hours, Disp: 180 tablet, Rfl: 3  ?  rosuvastatin (CRESTOR) 10 mg tablet, Take 1 tablet (10 mg total) by mouth daily (with dinner), Disp: 90 tablet, Rfl: 3  ?  semaglutide 0.25 mg or 0.5 mg dose (OZEMPIC, 0.25 OR 0.5 MG/DOSE,) pen, Inject 0.75 mLs (0.5 mg total) into the skin once a week, Disp: 9 mL, Rfl: 3  ?  pioglitazone (ACTOS) 15 mg tablet, Take 1 tablet (15 mg total) by mouth daily, Disp: 90 tablet, Rfl: 3  ?  empagliflozin (JARDIANCE) 10 mg tablet, Take 1 tablet (10 mg total) by mouth every morning, Disp: 90 tablet, Rfl: 3  ?  finasteride (PROSCAR) 5 mg tablet, TAKE 1 TABLET BY MOUTH EVERY  DAY FOR USE BY MEN ONLY, Disp: 90 tablet, Rfl: 3  ?  mirabegron (MYRBETRIQ) 50 mg 24 hr tablet, Take 1 tablet (50 mg total) by mouth daily, Disp: 90 tablet, Rfl: 3  ?  amoxicillin (AMOXIL) 500 mg capsule, Take 1 capsule (500 mg total) by mouth 2 times daily  for Simple Infection of the Urinary Tract, Disp: 10 capsule, Rfl: 0  ?  memantine (NAMENDA) 10 MG tablet, TAKE 1 TABLET BY MOUTH TWO TIMES DAILY, Disp: 180 tablet, Rfl: 3  ?  generic DME, Dispense 1 single point cane. Use as directed., Disp: 1 each, Rfl: 0  ?  insulin pen needle (BD PEN NEEDLE NANO 2ND GEN) 32G X 4 MM, Use 1 time per week., Disp: 30 each, Rfl: 2  ?  blood glucose monitor (FREESTYLE LITE) kit, TEST AS DIRECTED, Disp: , Rfl:   ?  Lancets Micro Thin 33G MISC, Use as directed once daily. Dispense lancets for one touch verio, Disp: 100 each, Rfl: 5  ?  blood glucose monitor w/Device KIT, Test as directed, Disp: 1 kit, Rfl: 0  ?  Coenzyme Q10 200 MG capsule,  Take 200 mg by mouth daily, Disp: , Rfl:   ?  Omega-3 Fatty Acids (FISH OIL) 1000 MG CAPS capsule, Take 1 g by mouth daily, Disp: , Rfl:   ?  dofetilide (TIKOSYN) 250 MCG capsule, Take 250 mcg by mouth 2 times daily, Disp: , Rfl:   ?  lancets (ONETOUCH ULTRASOFT), Use   2  times per day as instructed for blood glucose testing., Disp: 100 each, Rfl: 3  ?  Blood Glucose Calibration (OT ULTRA/FASTTK CNTRL SOLN) SOLN, As directed, Disp: 1 each, Rfl: 5  ?  CINNAMON PO, Take by mouth, Disp: , Rfl:   ?  Multiple Vitamins-Minerals (MULTI VITAMIN/MINERALS PO), Take by mouth, Disp: , Rfl:   ?  cholecalciferol (VITAMIN D) 1000 UNIT tablet, Take 2,000 Units by mouth daily    , Disp: , Rfl:   ?  acetaminophen (TYLENOL) 500 MG tablet, Take 500 mg by mouth 2 times daily., Disp: , Rfl:     Objective:  Physical exam:  BP 100/60   Pulse 76   Ht 1.753 m (5\' 9" )   Wt 82.1 kg (181 lb)   SpO2 99%   BMI 26.73 kg/m?   General appearance: Well appearing    Assessment/Plan:    1. Osteoarthritis of right knee  - has  upcoming appointment at orthopedic center, will await their assessment, needs xray completed  - discussed maintenance of physical activity  - declines joint replacement     2. Physical deconditioning  - start once weekly exercise at [facility]    3. Congestive heart failure, unspecified HF chronicity, unspecified heart failure type  - follow up with cardiology  - metoprolol succinate ER (TOPROL-XL) 25 mg 24 hr tablet; Take 1 tablet (25 mg total) by mouth every 12 hours  Dispense: 180 tablet; Refill: 3  - Sacubitril-Valsartan (ENTRESTO) 24-26 mg per tablet; Take 1 tablet by mouth 2 times daily  Dispense: 180 tablet; Refill: 3    4. Paroxysmal atrial fibrillation  - metoprolol succinate ER (TOPROL-XL) 25 mg 24 hr tablet; Take 1 tablet (25 mg total) by mouth every 12 hours  Dispense: 180 tablet; Refill: 3  - apixaban (ELIQUIS) 5 mg tablet; Take 1 tablet (5 mg total) by mouth every 12 hours  Dispense: 180 tablet; Refill: 3    5. Type 2 diabetes mellitus with other specified complication, without long-term current use of insulin  HbA1c was last checked on 11/29/2021 and was 6.8% which is at goal of <8. Per the patient is UTD on eye exam. Patient is prescribed ARB for prevention of diabetic nephropathy.  Patient is prescribed statin to reduce risk of cardiovascular events.        Diabetic medication regimen as of TODAY is as follows:   Current Diabetes Medications             metFORMIN (GLUCOPHAGE) 500 mg tablet Take 1 tablet (500 mg total) by mouth 2 times daily (with meals)    semaglutide 0.25 mg or 0.5 mg dose (OZEMPIC, 0.25 OR 0.5 MG/DOSE,) pen Inject 0.75 mLs (0.5 mg total) into the skin once a week    pioglitazone (ACTOS) 15 mg tablet Take 1 tablet (15 mg total) by mouth daily    empagliflozin (JARDIANCE) 10 mg tablet Take 1 tablet (10 mg total) by mouth every morning        6. Pure hypercholesterolemia  LDL was last checked on 11/29/2021 and was 43 mg/dL which is at goal of <16    Medication regimen  as of TODAY is as  follows:   Dyslipidemia Medications     HMG CoA Reductase Inhibitors       rosuvastatin (CRESTOR) 10 mg tablet    Take 1 tablet (10 mg total) by mouth daily (with dinner)            Follow up in about 6 months (around 01/31/2023) for Annual wellness visit/CPE; fasting labs.    Rica Mast, MD, MPH, MA  Family Medicine  [facility]

## 2022-08-01 ENCOUNTER — Encounter: Payer: Self-pay | Admitting: Primary Care

## 2022-08-01 NOTE — Telephone Encounter (Signed)
An initial form was completed and placed in folder to be sent approx 2-3 weeks ago. Patient does have h/o neurology assessment in the EMR -     Rica Mast, MD, MPH  [facility]    -------------------------------------------------------------------  [facility] 02/05/2017    ? Jon [name] presents with short-term memory loss, this is not a feature of global amnesia, and I doubt that is part of his stroke syndrome. He has not had any imaging studies and neurologic workups since he was granted disability in 1993. A neuropsychological workup speaks of suspected posterior cerebral artery stroke, but at the time and imaged to confirm this was not obtained a CT was negative. In addition, there is certainly the possibility that a separate course of short-term memory loss has set in. For someone diagnosed with vascular dementia 25 years ago this patient is doing remarkably well.    Borderline not know about his history I would think that this patient may now develop age-related memory changes and that his cardiac condition-cardiomyopathy with an ejection fraction of 25% may be contributing to it. He has very low blood pressures he often is slightly fainty, and this all can contribute to cognitive impairment. While I cannot rule out that she could develop Alzheimer's disease in the future at this time I'm not able to make that diagnosis. My goal is to obtain an MRI of the brain without contrast, I would maintain him on Namenda and all further medications have to be discussed with his cardio specialists.    Jon Mylar Dohmeier MD  02/05/2017

## 2022-08-07 ENCOUNTER — Encounter: Payer: Self-pay | Admitting: Primary Care

## 2022-08-09 ENCOUNTER — Telehealth: Payer: Self-pay | Admitting: Primary Care

## 2022-08-09 NOTE — Telephone Encounter (Signed)
Spoke with Diane at [facility], reviewed pts med list and specialists with her.

## 2022-08-09 NOTE — Telephone Encounter (Signed)
Diane from [employer] called regarding this patient, she wanted to speak to a nurse to go over the med list review.  [phone] ext [phone]

## 2022-08-10 ENCOUNTER — Telehealth: Payer: Self-pay | Admitting: Primary Care

## 2022-08-10 NOTE — Telephone Encounter (Signed)
Recommend that patient discuss with his pharmacist.     Rica Mast, MD, MPH  [facility]

## 2022-08-10 NOTE — Telephone Encounter (Signed)
Patients daughter called to ask if the over counter joint supplement instaflex would interfere with other meds .  Please call  Daughter [phone]

## 2022-08-10 NOTE — Telephone Encounter (Signed)
Spoke with pts daughter and advised that she contact her dad's pharmacy to ask this question, she v/u and appreciation.

## 2022-08-10 NOTE — Telephone Encounter (Signed)
Five ingredients  What are the five ingredients in instaflex  The Instaflex Advanced supplement ingredients include: turmeric, resveratrol, Boswellia serrata, collagen, hyaluronic acid, and black pepper extract.Jan 19, 2016

## 2022-08-21 ENCOUNTER — Other Ambulatory Visit: Payer: Self-pay | Admitting: Primary Care

## 2022-08-21 NOTE — Telephone Encounter (Signed)
Last office visit: 07/31/2022   Last telemedicine visit:  07/23/2022   Next appointment: 02/12/2023

## 2022-09-18 ENCOUNTER — Other Ambulatory Visit: Payer: Self-pay | Admitting: Primary Care

## 2022-09-18 DIAGNOSIS — E1169 Type 2 diabetes mellitus with other specified complication: Secondary | ICD-10-CM

## 2022-09-18 NOTE — Telephone Encounter (Signed)
Last office visit:   07/31/2022  Last telemedicine visit:  07/23/2022  Last PA office visit:  Visit date not found  Patients upcoming appointments:  Future Appointments   Date Time Provider Department Center   01/25/2023  9:30 AM Bloom, Gibson Ramp, MD [facility] None   02/12/2023  1:00 PM Calton Golds, Nelida Gores, MD [facility] None     Recent Lab results:  GENERAL CHEMISTRY   Recent Labs     11/29/21  1137   NA 138   K 5.0   CL 102   CO2 20   GAP 16   UN 28*   CREAT 1.41*   GLU 173*   CA 9.3      LIPID PROFILE   Recent Labs     11/29/21  1137   CHOL 109   TRIG 153*   HDL 35*   LDLC 43      LIVER PROFILE   Recent Labs     11/29/21  1137   ALT 18   AST 20   ALK 76   TB 0.4      DIABETES THYROID   Recent Labs     11/29/21  1137   HA1C 6.8*    Recent Labs     11/29/21  1137   TSH 4.25*         Pending/Orders Labs:  Lab Frequency Next Occurrence   URINALYSIS WITH MICROSCOPIC Once 11/29/2021   Microalbumin, Urine, Random Once 06/06/2022   TSH Once 06/06/2022   CBC and differential Once 06/06/2022   Basic Metabolic Panel Once 06/06/2022   Hemoglobin A1c Every 3 months + PRN

## 2022-09-21 ENCOUNTER — Other Ambulatory Visit: Payer: Self-pay | Admitting: Gastroenterology

## 2022-10-19 ENCOUNTER — Encounter: Payer: Self-pay | Admitting: Gastroenterology

## 2022-10-23 LAB — UNMAPPED LAB RESULTS
Hematocrit (HT): 39 % — ABNORMAL LOW (ref 40–52)
Hemoglobin (HGB) (HT): 12.7 g/dL — ABNORMAL LOW (ref 13.0–17.5)
MCHC (HT): 33 g/dL (ref 32.0–36.0)
MCV (HT): 92.8 fL (ref 81.0–99.0)
Mean Corpuscular Hemoglobin (MCH) (HT): 30.6 pg (ref 26.0–34.0)
Platelets (HT): 167 10 3/uL (ref 150–450)
RBC (HT): 4.15 10 6/uL — ABNORMAL LOW (ref 4.20–5.90)
RDW (HT): 12.4 % (ref 11.5–15.0)
WBC (HT): 6.4 10 3/uL (ref 4.0–10.8)

## 2022-11-17 ENCOUNTER — Other Ambulatory Visit: Payer: Self-pay | Admitting: Primary Care

## 2022-11-17 DIAGNOSIS — E1169 Type 2 diabetes mellitus with other specified complication: Secondary | ICD-10-CM

## 2022-11-19 NOTE — Telephone Encounter (Signed)
[facility] Med Refill Note  11/19/22  Jon [name]  22-Aug-1934  MRN: [id number]    Last office visit: 07/31/2022    Last telehome visit: 07/23/2022    Recent Visits  Date Type Provider Dept   07/31/22 Office Visit Tillie Rung, MD [facility]   07/23/22 Telemedicine Visit Tillie Rung, MD [facility]   Showing recent visits within past 185 days in an active department and meeting all other requirements  Future Appointments  No visits were found meeting these conditions.  Showing future appointments within next 0 days in an active department and meeting all other requirements      LABORATORY DATA:  Last Renal Func was  11/29/2021: Creatinine 1.41 mg/dL (H; Ref range: 3.01 - 6.01 mg/dL); eGFR BY CREAT 49 * (!; Ref range: *); Potassium 5.0 mmol/L (Ref range: 3.3 - 5.1 mmol/L).   Last LFTs was  11/29/2021: ALT 18 U/L (Ref range: 0 - 50 U/L); AST 20 U/L (Ref range: 0 - 50 U/L).   Last Lipid panel was 11/29/2021: Cholesterol 109 mg/dL (Ref range: mg/dL); HDL 35 mg/dL (L; Ref range: 40 - 60 mg/dL); LDL Calculated 43 mg/dL (Ref range: mg/dL).  Last A1C was 11/29/2021: Hemoglobin A1C 6.8 % (H; Ref range: %).  Last TFT was 11/29/2021: TSH 4.25 uIU/mL (H; Ref range: 0.27 - 4.20 uIU/mL), 11/29/2021: Free T4 1.1 ng/dL (Ref range: 0.9 - 1.7 ng/dL).    Merry Proud, RN

## 2023-01-11 ENCOUNTER — Other Ambulatory Visit: Payer: Self-pay | Admitting: Primary Care

## 2023-01-11 DIAGNOSIS — E1169 Type 2 diabetes mellitus with other specified complication: Secondary | ICD-10-CM

## 2023-01-11 NOTE — Telephone Encounter (Signed)
[facility] Med Refill Note  01/11/23  Jon [name]  1934-03-26  MRN: [id number]    Last office visit: 07/31/2022    Last telehome visit: 07/23/2022    Recent Visits  Date Type Provider Dept   07/31/22 Office Visit Tillie Rung, MD [facility]   07/23/22 Telemedicine Visit Tillie Rung, MD [facility]   Showing recent visits within past 185 days in an active department and meeting all other requirements  Future Appointments  No visits were found meeting these conditions.  Showing future appointments within next 0 days in an active department and meeting all other requirements      LABORATORY DATA:  Last Renal Func was  11/29/2021: Creatinine 1.41 mg/dL (H; Ref range: 0.45 - 4.09 mg/dL); eGFR BY CREAT 49 * (!; Ref range: *); Potassium 5.0 mmol/L (Ref range: 3.3 - 5.1 mmol/L).   Last LFTs was  11/29/2021: ALT 18 U/L (Ref range: 0 - 50 U/L); AST 20 U/L (Ref range: 0 - 50 U/L).   Last Lipid panel was 11/29/2021: Cholesterol 109 mg/dL (Ref range: mg/dL); HDL 35 mg/dL (L; Ref range: 40 - 60 mg/dL); LDL Calculated 43 mg/dL (Ref range: mg/dL).  Last A1C was 11/29/2021: Hemoglobin A1C 6.8 % (H; Ref range: %).  Last TFT was 11/29/2021: TSH 4.25 uIU/mL (H; Ref range: 0.27 - 4.20 uIU/mL), 11/29/2021: Free T4 1.1 ng/dL (Ref range: 0.9 - 1.7 ng/dL).    [name]

## 2023-01-11 NOTE — Progress Notes (Unsigned)
Jon [name] is due for a A1C..  he has an appointment on 01/14/2023   Please sign the pending ordered

## 2023-01-14 ENCOUNTER — Ambulatory Visit: Payer: Medicare (Managed Care) | Admitting: Primary Care

## 2023-01-14 ENCOUNTER — Other Ambulatory Visit: Payer: Self-pay

## 2023-01-14 ENCOUNTER — Encounter: Payer: Self-pay | Admitting: Primary Care

## 2023-01-14 VITALS — BP 98/58 | HR 70 | Temp 96.9°F | Ht 69.0 in | Wt 188.0 lb

## 2023-01-14 DIAGNOSIS — H6123 Impacted cerumen, bilateral: Secondary | ICD-10-CM

## 2023-01-14 NOTE — Progress Notes (Unsigned)
Family Medicine Office Visit Note    Chief Complaint   Patient presents with    Ear Fullness       Subjective:  Jon [name] is a 89 y.o. male here for:    Ear fullness and trouble hearing, requesting ear flush     ROS: Per HPI    Patient's medications, allergies, and problem list were reviewed.        Current Outpatient Medications:     OZEMPIC, 0.25 OR 0.5 MG/DOSE, pen, INJECT 0.5MG  SUBCUTANEOUSLY (UNDER THE SKIN) EVERY WEEK, Disp: 3 mL, Rfl: 1    memantine (NAMENDA) 10 MG tablet, TAKE 1 TABLET BY MOUTH TWO TIMES DAILY, Disp: 180 tablet, Rfl: 3    dofetilide (TIKOSYN) 250 MCG capsule, Take 1 capsule (250 mcg total) by mouth 2 times daily, Disp: , Rfl:     memantine (NAMENDA) 10 MG tablet, Take 1 tablet (10 mg total) by mouth 2 times daily, Disp: , Rfl:     Cinnamon 500 MG capsule, Take 500 mg by mouth, Disp: , Rfl:     cholecalciferol (VITAMIN D-1000 MAX ST) 1,000 unit tablet, Take 1 tablet (1,000 units total) by mouth daily, Disp: , Rfl:     metFORMIN (GLUCOPHAGE) 500 mg tablet, Take 1 tablet (500 mg total) by mouth 2 times daily (with meals), Disp: 180 tablet, Rfl: 3    apixaban (ELIQUIS) 5 mg tablet, Take 1 tablet (5 mg total) by mouth every 12 hours, Disp: 180 tablet, Rfl: 3    rosuvastatin (CRESTOR) 10 mg tablet, Take 1 tablet (10 mg total) by mouth daily (with dinner), Disp: 90 tablet, Rfl: 3    pioglitazone (ACTOS) 15 mg tablet, Take 1 tablet (15 mg total) by mouth daily, Disp: 90 tablet, Rfl: 3    finasteride (PROSCAR) 5 mg tablet, TAKE 1 TABLET BY MOUTH EVERY DAY FOR USE BY MEN ONLY, Disp: 90 tablet, Rfl: 3    mirabegron (MYRBETRIQ) 50 mg 24 hr tablet, Take 1 tablet (50 mg total) by mouth daily, Disp: 90 tablet, Rfl: 3    amoxicillin (AMOXIL) 500 mg capsule, Take 1 capsule (500 mg total) by mouth 2 times daily  for Simple Infection of the Urinary Tract, Disp: 10 capsule, Rfl: 0    generic DME, Dispense 1 single point cane. Use as directed., Disp: 1 each, Rfl: 0    insulin pen needle (BD PEN NEEDLE  NANO 2ND GEN) 32G X 4 MM, Use 1 time per week., Disp: 30 each, Rfl: 2    blood glucose monitor (FREESTYLE LITE) kit, TEST AS DIRECTED, Disp: , Rfl:     Lancets Micro Thin 33G MISC, Use as directed once daily. Dispense lancets for one touch verio, Disp: 100 each, Rfl: 5    blood glucose monitor w/Device KIT, Test as directed, Disp: 1 kit, Rfl: 0    Coenzyme Q10 200 MG capsule, Take 1 capsule (200 mg total) by mouth daily, Disp: , Rfl:     Omega-3 Fatty Acids (FISH OIL) 1000 MG CAPS capsule, Take 1 capsule (1 g total) by mouth daily, Disp: , Rfl:     dofetilide (TIKOSYN) 250 MCG capsule, Take 1 capsule (250 mcg total) by mouth 2 times daily, Disp: , Rfl:     lancets (ONETOUCH ULTRASOFT), Use   2  times per day as instructed for blood glucose testing., Disp: 100 each, Rfl: 3    Blood Glucose Calibration (OT ULTRA/FASTTK CNTRL SOLN) SOLN, As directed, Disp: 1 each, Rfl: 5  CINNAMON PO, Take by mouth, Disp: , Rfl:     Multiple Vitamins-Minerals (MULTI VITAMIN/MINERALS PO), Take by mouth, Disp: , Rfl:     cholecalciferol (VITAMIN D) 1000 UNIT tablet, Take 2 tablets (2,000 units total) by mouth daily, Disp: , Rfl:     acetaminophen (TYLENOL) 500 MG tablet, Take 1 tablet (500 mg total) by mouth 2 times daily, Disp: , Rfl:     metoprolol succinate ER (TOPROL-XL) 25 mg 24 hr tablet, Take 1 tablet (25 mg total) by mouth every 12 hours, Disp: 180 tablet, Rfl: 3    Sacubitril-Valsartan (ENTRESTO) 24-26 mg per tablet, Take 1 tablet by mouth 2 times daily, Disp: 180 tablet, Rfl: 3    empagliflozin (JARDIANCE) 10 mg tablet, Take 1 tablet (10 mg total) by mouth every morning, Disp: 90 tablet, Rfl: 3    Objective:  Physical exam:  BP 98/58   Pulse 70   Temp 36.1 C (96.9 F) (Temporal)   Ht 1.753 m (5\' 9" )   Wt 85.3 kg (188 lb)   SpO2 96%   BMI 27.76 kg/m   General appearance: Well appearing  HEENT: TM not visible, cerumen fully impacted     Procedure Note: Ear Irrigation/Cerumen Removal    Ear irrigation ordered for cerumen  impaction of bilateral ears.     Patient draped with protective towel/chux pad to absorb excess irrigation fluid.    Counseled patient that sensations of dizziness, nausea, and/or pain are commonly experienced during irrigation and to alert RN if this occurs.    Patient seated with the head tilted slightly toward the affected ear.    Basin positioned to collect the fluid drained from patient's ear.    Tip of irrigation bottle gently inserted at the entrance to the ear canal.    Warm water instilled with ear irrigation bottle until cerumen was loosened.    Otoscopic examination after procedure revealed:    Cerumen plug/foreign object removed and tympanic membrane intact    Patient tolerated procedure well without signs/symptoms/complaints of dizziness, pain, tinnitus, vertigo, nystagmus, nausea and/or bleeding.         Follow up if symptoms worsen or fail to improve.    Rica Mast, MD, MPH, MA  Family Medicine  [facility]

## 2023-01-16 ENCOUNTER — Other Ambulatory Visit: Payer: Self-pay | Admitting: Primary Care

## 2023-01-17 NOTE — Telephone Encounter (Signed)
[facility] Med Refill Note  01/17/23  Jon [name]  05/23/1934  MRN: [id number]    Last office visit: 01/14/2023    Last telehome visit: 07/23/2022    Recent Visits  Date Type Provider Dept   01/14/23 Office Visit Tillie Rung, MD [facility]   07/31/22 Office Visit Tillie Rung, MD [facility]   07/23/22 Telemedicine Visit Tillie Rung, MD [facility]   Showing recent visits within past 185 days in an active department and meeting all other requirements  Future Appointments  No visits were found meeting these conditions.  Showing future appointments within next 0 days in an active department and meeting all other requirements      LABORATORY DATA:  Last Renal Func was  11/29/2021: Creatinine 1.41 mg/dL (H; Ref range: 0.45 - 4.09 mg/dL); eGFR BY CREAT 49 * (!; Ref range: *); Potassium 5.0 mmol/L (Ref range: 3.3 - 5.1 mmol/L).   Last LFTs was  11/29/2021: ALT 18 U/L (Ref range: 0 - 50 U/L); AST 20 U/L (Ref range: 0 - 50 U/L).   Last Lipid panel was 11/29/2021: Cholesterol 109 mg/dL (Ref range: mg/dL); HDL 35 mg/dL (L; Ref range: 40 - 60 mg/dL); LDL Calculated 43 mg/dL (Ref range: mg/dL).  Last A1C was 11/29/2021: Hemoglobin A1C 6.8 % (H; Ref range: %).  Last TFT was 11/29/2021: TSH 4.25 uIU/mL (H; Ref range: 0.27 - 4.20 uIU/mL), 11/29/2021: Free T4 1.1 ng/dL (Ref range: 0.9 - 1.7 ng/dL).    [name]

## 2023-01-24 ENCOUNTER — Other Ambulatory Visit: Payer: Self-pay | Admitting: Urology

## 2023-01-24 DIAGNOSIS — R3989 Other symptoms and signs involving the genitourinary system: Secondary | ICD-10-CM

## 2023-01-25 ENCOUNTER — Encounter: Payer: Self-pay | Admitting: Urology

## 2023-01-25 ENCOUNTER — Other Ambulatory Visit: Payer: Self-pay

## 2023-01-25 ENCOUNTER — Ambulatory Visit: Payer: Medicare (Managed Care) | Attending: Urology | Admitting: Urology

## 2023-01-25 DIAGNOSIS — N138 Other obstructive and reflux uropathy: Secondary | ICD-10-CM | POA: Insufficient documentation

## 2023-01-25 DIAGNOSIS — N401 Enlarged prostate with lower urinary tract symptoms: Secondary | ICD-10-CM | POA: Insufficient documentation

## 2023-01-25 DIAGNOSIS — R3989 Other symptoms and signs involving the genitourinary system: Secondary | ICD-10-CM | POA: Insufficient documentation

## 2023-01-25 DIAGNOSIS — R350 Frequency of micturition: Secondary | ICD-10-CM | POA: Insufficient documentation

## 2023-01-25 LAB — URINALYSIS WITH MICROSCOPIC
Ketones, UA: NEGATIVE
Nitrite,UA: NEGATIVE
Specific Gravity,UA: 1.028 (ref 1.002–1.030)
WBC,UA: >50 /hpf — AB (ref 0–5)
pH,UA: 6 (ref 5.0–8.0)

## 2023-01-25 LAB — POCT BLADDER SCAN PVR: Residual mL: 80

## 2023-01-25 LAB — POCT URINALYSIS DIPSTICK
Glucose,UA POCT: 1000 mg/dL — AB
Ketones,UA POCT: NEGATIVE mg/dL
Leuk Esterase,UA POCT: 1 — AB
Lot #: 65249402
Nitrite,UA POCT: NEGATIVE
PH,UA POCT: 5 (ref 5–8)
Specific gravity,UA POCT: 1.015 (ref 1.002–1.030)

## 2023-01-25 MED ORDER — TROSPIUM CHLORIDE 20 MG PO TABS *I*
20.0000 mg | ORAL_TABLET | Freq: Two times a day (BID) | ORAL | 3 refills | Status: DC
Start: 2023-01-25 — End: 2023-05-20

## 2023-01-25 NOTE — Progress Notes (Signed)
[facility] Urology Follow Up Visit    Jon [name]  01/25/2023    Chief complaint : urinary frequency   Pt is accompanied by his daughter    HPI: Jon [name] is a 89 y.o. male who presents for follow up urinary frequency. Currently on finasteride. Previously on Myrbetriq which he did not feel that it helped with urinary frequency. States frequency is worse at night, and is very variable. He states stream is weak. He has some hesitancy. He is most bothered by frequency. Of note, he is on Jardiance and spironolactone.        Changes since prior visit :   PMH -   Past Medical History:   Diagnosis Date    Arthritis     BPH (benign prostatic hypertrophy)     CHF (congestive heart failure)     Clotting disorder     Dementia     mild    Diabetes mellitus     Paget's disease of bone     Palpitations     Protein S deficiency     On chronic anticoagulation    Sinus bradycardia     Varicella      PSH -   Past Surgical History:   Procedure Laterality Date    APPENDECTOMY      CHOLECYSTECTOMY      EYE SURGERY      GALLBLADDER SURGERY      JOINT REPLACEMENT      KNEE REPLACEMENT      SKIN GRAFT      TONSILLECTOMY       Medications -   Current Outpatient Medications   Medication Sig    spironolactone (ALDACTONE) 25 mg tablet Take 1 tablet (25 mg total) by mouth daily    finasteride (PROSCAR) 5 mg tablet TAKE 1 TABLET BY MOUTH EVERY DAY. FOR USE BY MEN ONLY    OZEMPIC, 0.25 OR 0.5 MG/DOSE, pen INJECT 0.5MG  SUBCUTANEOUSLY (UNDER THE SKIN) EVERY WEEK    memantine (NAMENDA) 10 MG tablet TAKE 1 TABLET BY MOUTH TWO TIMES DAILY    dofetilide (TIKOSYN) 250 MCG capsule Take 1 capsule (250 mcg total) by mouth 2 times daily    memantine (NAMENDA) 10 MG tablet Take 1 tablet (10 mg total) by mouth 2 times daily    Cinnamon 500 MG capsule Take 500 mg by mouth    cholecalciferol (VITAMIN D-1000 MAX ST) 1,000 unit tablet Take 1 tablet (1,000 units total) by mouth daily    metFORMIN (GLUCOPHAGE) 500 mg tablet Take 1 tablet (500 mg total)  by mouth 2 times daily (with meals)    apixaban (ELIQUIS) 5 mg tablet Take 1 tablet (5 mg total) by mouth every 12 hours    rosuvastatin (CRESTOR) 10 mg tablet Take 1 tablet (10 mg total) by mouth daily (with dinner)    pioglitazone (ACTOS) 15 mg tablet Take 1 tablet (15 mg total) by mouth daily    amoxicillin (AMOXIL) 500 mg capsule Take 1 capsule (500 mg total) by mouth 2 times daily  for Simple Infection of the Urinary Tract    generic DME Dispense 1 single point cane. Use as directed.    insulin pen needle (BD PEN NEEDLE NANO 2ND GEN) 32G X 4 MM Use 1 time per week.    blood glucose monitor (FREESTYLE LITE) kit TEST AS DIRECTED    Lancets Micro Thin 33G MISC Use as directed once daily. Dispense lancets for one touch verio    blood glucose  monitor w/Device KIT Test as directed    Coenzyme Q10 200 MG capsule Take 1 capsule (200 mg total) by mouth daily    Omega-3 Fatty Acids (FISH OIL) 1000 MG CAPS capsule Take 1 capsule (1 g total) by mouth daily    dofetilide (TIKOSYN) 250 MCG capsule Take 1 capsule (250 mcg total) by mouth 2 times daily    lancets (ONETOUCH ULTRASOFT) Use   2  times per day as instructed for blood glucose testing.    Blood Glucose Calibration (OT ULTRA/FASTTK CNTRL SOLN) SOLN As directed    CINNAMON PO Take by mouth    Multiple Vitamins-Minerals (MULTI VITAMIN/MINERALS PO) Take by mouth    cholecalciferol (VITAMIN D) 1000 UNIT tablet Take 2 tablets (2,000 units total) by mouth daily    acetaminophen (TYLENOL) 500 MG tablet Take 1 tablet (500 mg total) by mouth 2 times daily    trospium (SANCTURA) 20 mg tablet Take 1 tablet (20 mg total) by mouth 2 times daily (before meals)    metoprolol succinate ER (TOPROL-XL) 25 mg 24 hr tablet Take 1 tablet (25 mg total) by mouth every 12 hours    Sacubitril-Valsartan (ENTRESTO) 24-26 mg per tablet Take 1 tablet by mouth 2 times daily    empagliflozin (JARDIANCE) 10 mg tablet Take 1 tablet (10 mg total) by mouth every morning     Allergies - no change  ROS  - no change    PHYSICAL EXAMINATION  There were no vitals taken for this visit.  GENERAL:  No acute distress, well developed, well nourished  NEUROLOGIC:  Oriented to person, place, time, and situation  PSYCHIATRIC:  Normal mood and affect  HEENT:  Normocephalic, atraumatic. Trachea midline.   RESPIRATORY: Respirations non-labored  SKIN:  Normal color      Recent Results (from the past 24 hour(s))   POCT urinalysis dipstick    Collection Time: 01/25/23  9:29 AM   Result Value Ref Range    Specific gravity,UA POCT 1.015 1.002 - 1.030    PH,UA POCT 5.0 5 - 8    Leuk Esterase,UA POCT +1 (!) Negative    Nitrite,UA POCT Negative Negative    Protein,UA POCT Trace (!) Negative mg/dL    Glucose,UA POCT 1000 mg/dL (!) Normal mg/dL    Ketones,UA POCT Negative Negative mg/dL    Urobilinogen,UA Test Not Performed Less than 1 mg/dL    Bilirubin,Ur Test Not Performed Negative, Test Not Performed    Blood,UA POCT About 250 (!) Negative    Exp date 3.31.24     Lot # [id number]    POCT Bladder Scan PVR    Collection Time: 01/25/23  9:59 AM   Result Value Ref Range    Residual mL 80        Diagnosis established - BPH with LUTS on finasteride;  pt's memory issues and h/o vascular dementia currently on  LUTs improved with lower HA1c    Plan of care -  BPH with LUTS  - Continue finasteride   - Would not be able to tolerate alpha blockers as patient has baseline hypotension   - Patient is not surgical candidate given age and comorbidity    2. Urinary frequency   - PVR minimal at 80mL  - Recommend trial trospium once at night   - Discussed avoiding bladder irritants   - Discussed with patient that some medications may be causing urinary frequency       FU 3 months with PVR check

## 2023-01-26 LAB — AEROBIC CULTURE: Aerobic Culture: 0

## 2023-01-28 ENCOUNTER — Telehealth: Payer: Self-pay | Admitting: Urology

## 2023-01-28 DIAGNOSIS — R3129 Other microscopic hematuria: Secondary | ICD-10-CM

## 2023-01-28 NOTE — Telephone Encounter (Signed)
UA micro shows 11-20 RBCs    Recommend hematuria workup.     Called patient - no answer - left detailed message on voicemail     Staff -     Please help schedule CT Urogram and cystoscopy for gross hematuria with Dr Kerman Passey, PA

## 2023-01-31 ENCOUNTER — Telehealth: Payer: Self-pay | Admitting: Primary Care

## 2023-01-31 NOTE — Telephone Encounter (Signed)
Med list reviewed with [facility] pharmacist.

## 2023-01-31 NOTE — Telephone Encounter (Signed)
Would like a call back to review meds with a nurse.

## 2023-02-01 ENCOUNTER — Ambulatory Visit: Payer: Medicare (Managed Care) | Admitting: Urology

## 2023-02-07 ENCOUNTER — Telehealth: Payer: Self-pay | Admitting: Urology

## 2023-02-07 ENCOUNTER — Ambulatory Visit
Admission: RE | Admit: 2023-02-07 | Discharge: 2023-02-07 | Disposition: A | Discharge status: Home / Self Care | Payer: Medicare (Managed Care) | Source: Ambulatory Visit | Attending: Urology | Admitting: Urology

## 2023-02-07 ENCOUNTER — Other Ambulatory Visit: Payer: Self-pay

## 2023-02-07 ENCOUNTER — Other Ambulatory Visit: Payer: Self-pay | Admitting: Urology

## 2023-02-07 DIAGNOSIS — R3129 Other microscopic hematuria: Secondary | ICD-10-CM

## 2023-02-07 DIAGNOSIS — N201 Calculus of ureter: Secondary | ICD-10-CM | POA: Insufficient documentation

## 2023-02-07 DIAGNOSIS — K5730 Diverticulosis of large intestine without perforation or abscess without bleeding: Secondary | ICD-10-CM | POA: Insufficient documentation

## 2023-02-07 DIAGNOSIS — N323 Diverticulum of bladder: Secondary | ICD-10-CM | POA: Insufficient documentation

## 2023-02-07 LAB — POCT CREATININE
Creatinine, POCT: 1.6 mg/dL — ABNORMAL HIGH (ref 0.67–1.17)
eGFR BY CREAT: 41 *

## 2023-02-07 NOTE — Telephone Encounter (Signed)
Dr. Elouise Munroe from Radiology called me at 3:25 pm.  She had patient there and he needed a CT Urogram with contrast for a microheme work up.    He did not have his ordered blood work done before hand.    They did bloodwork there and his GFR is 41 and Creatinine 1.6.    They were unable to do the CT with contrast but able to do without contrast.    Asked Dr. Wilma Flavin and given the ok to procced without contrast     Carney Bern informed.    Nasha Diss

## 2023-02-07 NOTE — Telephone Encounter (Signed)
Jon [name] with CT imaging called triage line asking if patient can have CT without contrast due to lab values  Contacted nurse working with Dr. Wilma Flavin and transferred call

## 2023-02-08 ENCOUNTER — Telehealth: Payer: Self-pay | Admitting: Urology

## 2023-02-08 ENCOUNTER — Other Ambulatory Visit
Admission: RE | Admit: 2023-02-08 | Discharge: 2023-02-08 | Disposition: A | Discharge status: Home / Self Care | Payer: Medicare (Managed Care) | Source: Ambulatory Visit | Attending: Primary Care | Admitting: Primary Care

## 2023-02-08 DIAGNOSIS — R350 Frequency of micturition: Secondary | ICD-10-CM | POA: Insufficient documentation

## 2023-02-08 DIAGNOSIS — E1169 Type 2 diabetes mellitus with other specified complication: Secondary | ICD-10-CM | POA: Insufficient documentation

## 2023-02-08 DIAGNOSIS — R3129 Other microscopic hematuria: Secondary | ICD-10-CM | POA: Insufficient documentation

## 2023-02-08 LAB — CBC AND DIFFERENTIAL
Baso # K/uL: 0 10*3/uL (ref 0.0–0.2)
Basophil %: 0.5 %
Eos # K/uL: 0.2 10*3/uL (ref 0.0–0.5)
Eosinophil %: 2.3 %
Hematocrit: 38 % (ref 37–52)
Hemoglobin: 12.3 g/dL (ref 12.0–17.0)
IMM Granulocytes #: 0 10*3/uL (ref 0.0–0.0)
IMM Granulocytes: 0.3 %
Lymph # K/uL: 1.4 10*3/uL (ref 1.0–5.0)
Lymphocyte %: 17.9 %
MCH: 31 pg (ref 26–32)
MCHC: 32 g/dL (ref 32–37)
MCV: 97 fL (ref 75–100)
Mono # K/uL: 0.6 10*3/uL (ref 0.1–1.0)
Monocyte %: 7.4 %
Neut # K/uL: 5.7 10*3/uL (ref 1.5–6.5)
Nucl RBC # K/uL: 0 10*3/uL (ref 0.0–0.0)
Nucl RBC %: 0 /100 WBC (ref 0.0–0.2)
Platelets: 184 10*3/uL (ref 150–450)
RBC: 3.9 MIL/uL — ABNORMAL LOW (ref 4.0–6.0)
RDW: 14.1 % (ref 0.0–15.0)
Seg Neut %: 71.6 %
WBC: 7.9 10*3/uL (ref 3.5–11.0)

## 2023-02-08 LAB — MICROALBUMIN, URINE, RANDOM
Creatinine,UR: 84 mg/dL (ref 20–300)
Microalb/Creat Ratio: 45.2 mg MA/g CR — ABNORMAL HIGH (ref 0.0–29.9)
Microalbumin,UR: 3.8 mg/dL

## 2023-02-08 LAB — URINALYSIS WITH MICROSCOPIC
Bacteria,UA: NONE SEEN
Blood,UA: NEGATIVE
Hyaline Casts,UA: NONE SEEN /lpf (ref 0–5)
Ketones, UA: NEGATIVE
Nitrite,UA: NEGATIVE
Specific Gravity,UA: 1.021 (ref 1.002–1.030)
WBC,UA: >50 /hpf — AB (ref 0–5)
pH,UA: 6 (ref 5.0–8.0)

## 2023-02-08 LAB — BASIC METABOLIC PANEL
Anion Gap: 10 (ref 7–16)
CO2: 23 mmol/L (ref 20–28)
Calcium: 9.6 mg/dL (ref 8.6–10.2)
Chloride: 108 mmol/L (ref 96–108)
Creatinine: 1.4 mg/dL — ABNORMAL HIGH (ref 0.67–1.17)
Glucose: 130 mg/dL — ABNORMAL HIGH (ref 60–99)
Lab: 26 mg/dL — ABNORMAL HIGH (ref 6–20)
Potassium: 4.9 mmol/L (ref 3.3–5.1)
Sodium: 141 mmol/L (ref 133–145)
eGFR BY CREAT: 49 * — AB

## 2023-02-08 LAB — TSH: TSH: 5.65 u[IU]/mL — ABNORMAL HIGH (ref 0.27–4.20)

## 2023-02-08 LAB — HEMOGLOBIN A1C: Hemoglobin A1C: 6.7 % — ABNORMAL HIGH

## 2023-02-08 LAB — MULTIPLE ORDERING DOCS

## 2023-02-08 NOTE — Telephone Encounter (Signed)
Agree with plan - will await urine results

## 2023-02-08 NOTE — Telephone Encounter (Signed)
Patient's daughter called in to report her father is having UTI Symptoms.     She reports he is drinking enough water, between 6-8 cups of water a day. He is having urinary frequency, bladder pressure, and painful urination.     Patient's daughter is worried he has a UTI      Please call [phone] to discuss.

## 2023-02-08 NOTE — Telephone Encounter (Signed)
Call received from [place], wife (okay per HIPAA), asking about the urine test results they received for patient via [facility]. Explained that those results are from the UA and not the culture, which will take a few days to result, and that once those are back we can confirm if patient does have an infection and use that to determine the treatment if necessary. Pharmacy confirmed for [facility] if script ends up being needed. [name] stated understanding and will also ensure patient try to increase fluid intake.

## 2023-02-08 NOTE — Telephone Encounter (Signed)
Called and spoke with wife  States patient is having frequency, urgency and discomfort when he urinates  Orders placed for UA and Cx

## 2023-02-09 LAB — AEROBIC CULTURE: Aerobic Culture: 0

## 2023-02-11 ENCOUNTER — Other Ambulatory Visit
Admission: RE | Admit: 2023-02-11 | Discharge: 2023-02-11 | Disposition: A | Discharge status: Home / Self Care | Payer: Medicare (Managed Care) | Source: Ambulatory Visit | Attending: Urology | Admitting: Urology

## 2023-02-11 DIAGNOSIS — R350 Frequency of micturition: Secondary | ICD-10-CM

## 2023-02-11 NOTE — Telephone Encounter (Signed)
With a negative culture, the next step is a cystoscopy which patient is scheduled for next month. I would give the medication some more time to see if it helps as well as pushing fluids and avoiding bladder irritants     Dru Laurel Tomma Lightning, PA

## 2023-02-11 NOTE — Telephone Encounter (Signed)
Writer called and spoke with [name] ok per HIPAA. Wife informed of message. Verbalized understanding.   Rogers Seeds, RN

## 2023-02-11 NOTE — Telephone Encounter (Signed)
Writer called and spoke with Jon [name] ok per HIPAA. Informed of results. Jon [name] stated that patient is still having the discomfort with urination, frequency and nocturia. Patient is waking up every hour.     Culture placed with gram stain patient to leave another urine sample at the lab.

## 2023-02-11 NOTE — Telephone Encounter (Signed)
Copied from [place] [id number]. Topic: Access to Care - Speak to Provider/Office Staff  >> Feb 11, 2023  8:56 AM Ranee Gosselin K wrote:    The patient's wife [name] is requesting to speak with Nurse. Patient's wife  stated that this is regarding her husband recent urine culture test.     [name] was told to call today for her husband's test results after being told the results would be in today.     Writer checked chart and saw that [name] had called previously on 02/08/23.     Writer will send a message to the Nurse pool.    Please call [name] back at [phone] to discuss.

## 2023-02-12 ENCOUNTER — Encounter: Payer: Self-pay | Admitting: Primary Care

## 2023-02-12 ENCOUNTER — Other Ambulatory Visit: Payer: Self-pay

## 2023-02-12 ENCOUNTER — Ambulatory Visit: Payer: Medicare (Managed Care) | Admitting: Primary Care

## 2023-02-12 VITALS — BP 84/54 | HR 83 | Ht 69.17 in | Wt 182.5 lb

## 2023-02-12 DIAGNOSIS — I480 Paroxysmal atrial fibrillation: Secondary | ICD-10-CM

## 2023-02-12 DIAGNOSIS — Z0000 Encounter for general adult medical examination without abnormal findings: Secondary | ICD-10-CM

## 2023-02-12 DIAGNOSIS — E1169 Type 2 diabetes mellitus with other specified complication: Secondary | ICD-10-CM

## 2023-02-12 DIAGNOSIS — I509 Heart failure, unspecified: Secondary | ICD-10-CM

## 2023-02-12 DIAGNOSIS — L309 Dermatitis, unspecified: Secondary | ICD-10-CM

## 2023-02-12 NOTE — Progress Notes (Signed)
Medicare Wellness Visit Note    Visit performed as:             Office Visit, met with patient in person    Today we reviewed and updated Jon [name]'s smoking status, activities of daily living, depression screen, fall risk, medications and allergies.   I have counseled the patient in the above areas.     Subjective:     Chief Complaint: Jon [name] is a 89 y.o. male here for a/an Subsequent Annual Medicare Visit and Annual Exam    In general, Jon [name] rates their overall health as:  good      Patient Care Team:  Karsten Fells, MD as PCP - General (Primary Care)  Corey Skains, MD (Ophthalmology)     Current Outpatient Medications on File Prior to Visit   Medication Sig Dispense Refill    acetaminophen (TYLENOL) 500 MG capsule Take 1 capsule (500 mg total) by mouth as needed      spironolactone (ALDACTONE) 25 mg tablet Take 1 tablet (25 mg total) by mouth daily      trospium (SANCTURA) 20 mg tablet Take 1 tablet (20 mg total) by mouth 2 times daily (before meals) 60 tablet 3    finasteride (PROSCAR) 5 mg tablet TAKE 1 TABLET BY MOUTH EVERY DAY. FOR USE BY MEN ONLY 90 tablet 3    OZEMPIC, 0.25 OR 0.5 MG/DOSE, pen INJECT 0.'5MG'$  SUBCUTANEOUSLY (UNDER THE SKIN) EVERY WEEK 3 mL 1    memantine (NAMENDA) 10 MG tablet TAKE 1 TABLET BY MOUTH TWO TIMES DAILY 180 tablet 3    dofetilide (TIKOSYN) 250 MCG capsule Take 1 capsule (250 mcg total) by mouth 2 times daily      memantine (NAMENDA) 10 MG tablet Take 1 tablet (10 mg total) by mouth 2 times daily      Cinnamon 500 MG capsule Take 500 mg by mouth      cholecalciferol (VITAMIN D-1000 MAX ST) 1,000 unit tablet Take 1 tablet (1,000 units total) by mouth daily      metoprolol succinate ER (TOPROL-XL) 25 mg 24 hr tablet Take 1 tablet (25 mg total) by mouth every 12 hours 180 tablet 3    metFORMIN (GLUCOPHAGE) 500 mg tablet Take 1 tablet (500 mg total) by mouth 2 times daily (with meals) 180 tablet 3    Sacubitril-Valsartan (ENTRESTO) 24-26 mg per tablet  Take 1 tablet by mouth 2 times daily 180 tablet 3    apixaban (ELIQUIS) 5 mg tablet Take 1 tablet (5 mg total) by mouth every 12 hours 180 tablet 3    rosuvastatin (CRESTOR) 10 mg tablet Take 1 tablet (10 mg total) by mouth daily (with dinner) 90 tablet 3    pioglitazone (ACTOS) 15 mg tablet Take 1 tablet (15 mg total) by mouth daily 90 tablet 3    empagliflozin (JARDIANCE) 10 mg tablet Take 1 tablet (10 mg total) by mouth every morning 90 tablet 3    amoxicillin (AMOXIL) 500 mg capsule Take 1 capsule (500 mg total) by mouth 2 times daily  for Simple Infection of the Urinary Tract 10 capsule 0    generic DME Dispense 1 single point cane. Use as directed. 1 each 0    insulin pen needle (BD PEN NEEDLE NANO 2ND GEN) 32G X 4 MM Use 1 time per week. 30 each 2    blood glucose monitor (FREESTYLE LITE) kit TEST AS DIRECTED      Lancets Micro Thin 33G  MISC Use as directed once daily. Dispense lancets for one touch verio 100 each 5    blood glucose monitor w/Device KIT Test as directed 1 kit 0    Coenzyme Q10 200 MG capsule Take 1 capsule (200 mg total) by mouth daily      Omega-3 Fatty Acids (FISH OIL) 1000 MG CAPS capsule Take 1 capsule (1 g total) by mouth daily      dofetilide (TIKOSYN) 250 MCG capsule Take 1 capsule (250 mcg total) by mouth 2 times daily      lancets (ONETOUCH ULTRASOFT) Use   2  times per day as instructed for blood glucose testing. 100 each 3    Blood Glucose Calibration (OT ULTRA/FASTTK CNTRL SOLN) SOLN As directed 1 each 5    CINNAMON PO Take by mouth      Multiple Vitamins-Minerals (MULTI VITAMIN/MINERALS PO) Take by mouth      cholecalciferol (VITAMIN D) 1000 UNIT tablet Take 2 tablets (2,000 units total) by mouth daily      acetaminophen (TYLENOL) 500 MG tablet Take 1 tablet (500 mg total) by mouth 2 times daily       No current facility-administered medications on file prior to visit.     Allergies   Allergen Reactions    Bee Venom Other (See Comments)     Red and swelling    No Known Latex  Allergy      Created by Conversion - 0;     Insects [Other] Other (See Comments)     Red and swelling     Patient Active Problem List    Diagnosis Date Noted    CHF (congestive heart failure) 11/20/2016     Priority: High    Paroxysmal atrial fibrillation 11/20/2016     Priority: High    Cardiomyopathy 11/12/2016     Priority: High    Protein S deficiency 11/12/2016     Priority: High    Type 2 diabetes mellitus with other specified complication [id number]     Priority: High     Created by Conversion        Chronic anticoagulation 11/12/2016     Priority: Medium    Vascular dementia 11/12/2016     Priority: Medium    BPH (benign prostatic hyperplasia) 08/07/2011     Priority: Medium    Paget's Disease 09/06/2004     Priority: Medium     Created by Conversion        Lower urinary tract symptoms (LUTS) 09/08/2014     Priority: Low    Opiate analgesic contract exists 09/09/2013     Priority: Low    Penile lesion 08/12/2012     Priority: Low    Urgency of urination 08/07/2011     Priority: Low    Obesity 09/06/2004     Priority: Low     Created by Conversion        Cognitive decline 11/10/2019    Ascending aorta dilatation 04/08/2018     3.9cm on 11/25/17; needs yearly surveillance      Chronic low back pain 09/23/2017     Past Medical History:   Diagnosis Date    Arthritis     BPH (benign prostatic hypertrophy)     CHF (congestive heart failure)     Clotting disorder     Dementia     mild    Diabetes mellitus     Paget's disease of bone     Palpitations     Protein  S deficiency     On chronic anticoagulation    Sinus bradycardia     Varicella      Past Surgical History:   Procedure Laterality Date    APPENDECTOMY      CHOLECYSTECTOMY      EYE SURGERY      GALLBLADDER SURGERY      JOINT REPLACEMENT      KNEE REPLACEMENT      SKIN GRAFT      TONSILLECTOMY       Family History   Problem Relation Age of Onset    BRCA 1/2 Brother     Arthritis Mother     Heart Disease Mother     High Blood Pressure Mother     Heart Disease  Father     Stroke Father     Depression Other     Diabetes Other     Heart failure Other     Heart Disease Other     High Blood Pressure Other     Kidney Disease Other      Social History     Socioeconomic History    Marital status: Married   Occupational History    Occupation: Former Personal assistant, used to work night shift   Tobacco Use    Smoking status: Never    Smokeless tobacco: Never   Substance and Sexual Activity    Alcohol use: No    Drug use: No    Sexual activity: Never   Social History Narrative    2 daughters with neurological issues       Objective:     Vital Signs: There were no vitals taken for this visit.   BMI: There is no height or weight on file to calculate BMI.    Vision Screening Results (Welcome visit only):  No results found.    Depression Screening Results:  Review Flowsheet          01/24/2022 01/09/2019 12/23/2017 11/07/2016   PHQ Scores   PSQ2 Q1 - Interest/Pleasure - N N N   PSQ2 Q2 - Down, Depressed, Hopeless - N N N   PHQ Calculated Score 0 - - -   No questionnaires on file.   Opioid Use/DAST- 10 Screening Results:   No data recorded  Activities of Daily Living/Functional Screening Results:  No data recorded    Fall Risk Screening Results:  No data recorded    Assessment and Plan:     Cognitive Function:  Recall of recent and remote events appears:  Normal      Advanced Care Planning:  was discussed and patient received paperwork to review     The following health maintenance plan was reviewed with the patient:    Health Maintenance Topics with due status: Overdue       Topic Date Due    IMM DTaP/Tdap/Td Never done    IMM-ZOSTER Never done    Diabetic Foot Exam ADA 06/22/2022    IMM-INFLUENZA 08/17/2022    COVID-19 Vaccine 08/17/2022     Health Maintenance Topics with due status: Postponed       Topic Postponed Until    HIV Screening USPSTF/[place] 10/22/2029 (Originally 03/09/1947)     Health Maintenance Topics with due status: Not Due       Topic Last Completion Date    Diabetic Eye Exam ADA  01/09/2022    Depression Screen Yearly 08/01/2022    Fall Risk Screening 01/25/2023    Diabetic A1C Monitoring Other 02/08/2023  Health Maintenance Topics with due status: Completed       Topic Last Completion Date    IMM Pneumo: 65+ Years 05/18/2016     Health Maintenance Topics with due status: Aged Out       Topic Date Due    IMM-Hepatitis B Vaccine Aged Out    IMM-HIB 0-5 Yrs or At-Risk Patients Aged Out    IMM-HPV 9-26 Yrs or Shared Decision (27-45 Yrs) Aged Out    IMM-MCV4 0-18 Yrs or At-Risk Patients Aged Out    IMM-ROTAVIRUS 0-8 MOS Aged Out     This health maintenance schedule, identified risks, a list of orders placed today and patient goals have been provided to [employer] in the after visit summary.     Plan for any concerns identified during screening or risk assessments:  Diabetic foot exam completed     Georgiann Mccoy, MD, MPH  [facility]

## 2023-02-12 NOTE — Patient Instructions (Signed)
Thank you for completing your Subsequent Annual Medicare Visit and Annual Exam   with [place] today.     The purpose of this visits was to:    Screen for disease  Assess risk of future medical problems  Help develop a healthy lifestyle  Update vaccines  Get to know your doctor in case of an illness    Patient Care Team:  Karsten Fells, MD as PCP - General (Primary Care)  Corey Skains, MD (Ophthalmology)     Medicare 5 Year Plan    The following items were identified as areas of concern during your screening today:  Diabetes - This is a risk factor for Heart Attack, Stroke, Kidney Problems, Eye Problems and a loss of feeling in your fingers and feet.       The Health Maintenance table below identifies screening tests and immunizations recommended by your health care team:  Health Maintenance: These screening recommendations are based on USPSTF, [employer], and [place] state guidelines   Topic Date Due   . DTaP/Tdap/Td Vaccines (1 - Tdap) Never done   . Shingles Vaccine (1 of 2) Never done   . Diabetic Foot Exam  06/22/2022   . Flu Shot (1) 08/17/2022   . COVID-19 Vaccine (3 - 2023-24 season) 08/17/2022   . HIV Screening  10/22/2029 (Originally 03/09/1947)   . Diabetic A1C Monitoring  05/09/2023   . Depression Screen Yearly  08/02/2023   . Diabetic Eye Exam  01/10/2024   . Fall Risk Screening  01/26/2024   . Pneumococcal Vaccination  Completed   . Hepatitis B Vaccine  Aged Out   . HIB Vaccine  Aged Out   . HPV Vaccine  Aged Out   . Meningococcal Vaccine  Aged Out   . Rotavirus Vaccine  Aged Out     In addition, goals and orders placed to address these recommendations are listed in the "Today's Visit" section.    We wish you the best of health and look forward to seeing you again next year for your Annual Medicare Wellness Visit.     If you have any health care concerns before then, please do not hesitate to contact us.

## 2023-02-12 NOTE — Progress Notes (Signed)
[facility]  Physical Exam Visit    Jon [name] is 89 y.o. male presenting for a full physical exam.     Specific Concern's today also include:   Here with daughter     CHF  Has been adherent to medication, however has been feeling more dizzy intermittently, BPs have been low 80-90s/60s at times. He tries to decrease po fluid intake due to urinary frequency. Did not drink fluids today     DM  Adherent to medications    Some low back pain, wonders if something wrong in spine    ~~~    Patient has not been hospitalized in the past year.    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    Type 2 diabetes mellitus with other specified complication [id number]    Obesity E66.9    Urgency of urination R39.15    BPH (benign prostatic hyperplasia) N40.0    Penile lesion N48.9    Opiate analgesic contract exists Z79.891    Lower urinary tract symptoms (LUTS) R39.9    CHF (congestive heart failure) I50.9    Paroxysmal atrial fibrillation I48.0    Cardiomyopathy I42.9    Chronic anticoagulation Z79.01    Protein S deficiency D68.59    Vascular dementia F01.50    Chronic low back pain M54.50, G89.29    Ascending aorta dilatation I77.810    Cognitive decline R41.89       Current Outpatient Medications   Medication Sig    spironolactone (ALDACTONE) 25 mg tablet Take 1 tablet (25 mg total) by mouth daily    trospium (SANCTURA) 20 mg tablet Take 1 tablet (20 mg total) by mouth 2 times daily (before meals)    finasteride (PROSCAR) 5 mg tablet TAKE 1 TABLET BY MOUTH EVERY DAY. FOR USE BY MEN ONLY    OZEMPIC, 0.25 OR 0.5 MG/DOSE, pen INJECT 0.'5MG'$  SUBCUTANEOUSLY (UNDER THE SKIN) EVERY WEEK    memantine (NAMENDA)  10 MG tablet TAKE 1 TABLET BY MOUTH TWO TIMES DAILY    dofetilide (TIKOSYN) 250 MCG capsule Take 1 capsule (250 mcg total) by mouth 2 times daily    memantine (NAMENDA) 10 MG tablet Take 1 tablet (10 mg total) by mouth 2 times daily    Cinnamon 500 MG capsule Take 500 mg by mouth    cholecalciferol (VITAMIN D-1000 MAX ST) 1,000 unit tablet Take 1 tablet (1,000 units total) by mouth daily    metoprolol succinate ER (TOPROL-XL) 25 mg 24 hr tablet Take 1 tablet (25 mg total) by mouth every 12 hours    metFORMIN (GLUCOPHAGE) 500 mg tablet Take 1 tablet (500 mg total) by mouth 2 times daily (with meals)    Sacubitril-Valsartan (ENTRESTO) 24-26 mg per tablet Take 1 tablet by mouth 2 times daily    apixaban (ELIQUIS) 5 mg tablet Take 1 tablet (5 mg total) by mouth every 12 hours    rosuvastatin (CRESTOR) 10 mg tablet Take 1 tablet (10 mg total) by mouth daily (with dinner)    pioglitazone (ACTOS) 15 mg tablet Take 1 tablet (15 mg total) by  mouth daily    empagliflozin (JARDIANCE) 10 mg tablet Take 1 tablet (10 mg total) by mouth every morning    generic DME Dispense 1 single point cane. Use as directed.    insulin pen needle (BD PEN NEEDLE NANO 2ND GEN) 32G X 4 MM Use 1 time per week.    blood glucose monitor (FREESTYLE LITE) kit TEST AS DIRECTED    Lancets Micro Thin 33G MISC Use as directed once daily. Dispense lancets for one touch verio    blood glucose monitor w/Device KIT Test as directed    Coenzyme Q10 200 MG capsule Take 1 capsule (200 mg total) by mouth daily    Omega-3 Fatty Acids (FISH OIL) 1000 MG CAPS capsule Take 1 capsule (1 g total) by mouth daily    dofetilide (TIKOSYN) 250 MCG capsule Take 1 capsule (250 mcg total) by mouth 2 times daily    lancets (ONETOUCH ULTRASOFT) Use   2  times per day as instructed for blood glucose testing.    Blood Glucose Calibration (OT ULTRA/FASTTK CNTRL SOLN) SOLN As directed    CINNAMON PO Take by mouth    Multiple Vitamins-Minerals (MULTI VITAMIN/MINERALS PO) Take by mouth     cholecalciferol (VITAMIN D) 1000 UNIT tablet Take 2 tablets (2,000 units total) by mouth daily    acetaminophen (TYLENOL) 500 MG capsule Take 1 capsule (500 mg total) by mouth as needed    amoxicillin (AMOXIL) 500 mg capsule Take 1 capsule (500 mg total) by mouth 2 times daily  for Simple Infection of the Urinary Tract       Allergies   Allergen Reactions    Bee Venom Other (See Comments)     Red and swelling    No Known Latex Allergy      Created by Conversion - 0;     Insects [Other] Other (See Comments)     Red and swelling       Past Medical History:   Diagnosis Date    Arthritis     BPH (benign prostatic hypertrophy)     CHF (congestive heart failure)     Clotting disorder     Dementia     mild    Diabetes mellitus     Paget's disease of bone     Palpitations     Protein S deficiency     On chronic anticoagulation    Sinus bradycardia     Varicella        Past Surgical History:   Procedure Laterality Date    APPENDECTOMY      CHOLECYSTECTOMY      EYE SURGERY      GALLBLADDER SURGERY      JOINT REPLACEMENT      KNEE REPLACEMENT      SKIN GRAFT      TONSILLECTOMY         Social History     Socioeconomic History    Marital status: Married   Occupational History    Occupation: Former Personal assistant, used to work night shift   Tobacco Use    Smoking status: Never    Smokeless tobacco: Never   Substance and Sexual Activity    Alcohol use: No    Drug use: No    Sexual activity: Never   Social History Narrative    2 daughters with neurological issues         Family History   Problem Relation Age of Onset    BRCA 1/2 Brother  Arthritis Mother     Heart Disease Mother     High Blood Pressure Mother     Heart Disease Father     Stroke Father     Depression Other     Diabetes Other     Heart failure Other     Heart Disease Other     High Blood Pressure Other     Kidney Disease Other             Domestic Violence Screening   Negative  '[x]'$      Positive - Intimate Partner  '[]'$  Other  '[]'$  Remote Hx  '[]'$    Depression Screening    PHQ - 9 Score  =      PHQ2      01/24/2022    10:53 AM   PHQ   PHQ Total Score 0        N/A - not indicated  '[]'$        Aging Concerns   Trouble Hearing - No  '[x]'$  Yes  '[]'$  Details:   Memory Concerns - No  '[x]'$  Yes  '[]'$  Details:   Fall in past year - No  '[]'$  Yes  '[x]'$  Details:   Worsening Balance - No  '[x]'$  Yes  '[]'$  Details:   Environmental Safety   - Discussed sunscreen, seatbelts, cycling helmet, smoke detectors, CO detectors, firearms  Notes:        Health Maintenance Topics with due status: Overdue       Topic Date Due    IMM DTaP/Tdap/Td Never done    IMM-ZOSTER Never done    Diabetic Foot Exam ADA 06/22/2022    IMM-INFLUENZA 08/17/2022    COVID-19 Vaccine 08/17/2022     Health Maintenance Topics with due status: Postponed       Topic Postponed Until    HIV Screening USPSTF/[place] 10/22/2029 (Originally 03/09/1947)     Health Maintenance Topics with due status: Not Due       Topic Last Completion Date    Diabetic Eye Exam ADA 01/09/2022    Depression Screen Yearly 08/01/2022    Diabetic A1C Monitoring Other 02/08/2023    Fall Risk Screening 02/12/2023     Health Maintenance Topics with due status: Completed       Topic Last Completion Date    IMM Pneumo: 65+ Years 05/18/2016     Health Maintenance Topics with due status: Aged Out       Topic Date Due    IMM-Hepatitis B Vaccine Aged Out    IMM-HIB 0-5 Yrs or At-Risk Patients Aged Out    IMM-HPV 9-26 Yrs or Shared Decision (27-45 Yrs) Aged Out    IMM-MCV4 0-18 Yrs or At-Risk Patients Aged Out    IMM-ROTAVIRUS 0-8 MOS Aged Out       PHYSICAL EXAM   BP (!) 84/54   Pulse 83   Ht 1.757 m (5' 9.17")   Wt 82.8 kg (182 lb 8 oz)   SpO2 99%   BMI 26.82 kg/m     GEN: Alert, pleasant well adult in NAD.   HEENT: Normocephalic and atraumatic.PERRL. Moist mucous membranes. TM's clear BL  NECK: Supple, no lymphadenopathy or thyromegaly   PULM: Easy respirations, well aerated, CTA bilaterally.   CVS: RRR, no murmur, normal S1& S2. Equal and strong radial pulses.   ABD:  soft,  nontender  SKIN: perianal and gluteal cleft with diffuse erythema and slight tissue maceration   NEURO: steady gait  Ext: no clubbing, edema or  cyanosis       Assessment/Plan   1. Preventative health care    2. Type 2 diabetes mellitus with other specified complication, without long-term current use of insulin  HbA1c was last checked on 02/08/2023 and was 6.7% which is at goal of <7. Per our records the patient is UTD on diabetic eye exam. Patient's diabetic foot exam is overdue, and referral was placed to podiatry Patient is prescribed ARB for prevention of diabetic nephropathy.  Patient is prescribed statin to reduce risk of cardiovascular events.        Diabetic medication regimen as of TODAY is as follows:   Current Diabetes Medications               OZEMPIC, 0.25 OR 0.5 MG/DOSE, pen INJECT 0.'5MG'$  SUBCUTANEOUSLY (UNDER THE SKIN) EVERY WEEK    metFORMIN (GLUCOPHAGE) 500 mg tablet Take 1 tablet (500 mg total) by mouth 2 times daily (with meals)    pioglitazone (ACTOS) 15 mg tablet Take 1 tablet (15 mg total) by mouth daily    empagliflozin (JARDIANCE) 10 mg tablet Take 1 tablet (10 mg total) by mouth every morning              3. Dermatitis  - apply vaseline or zinc oxide to perianal region and gluteal cleft     4. Congestive heart failure, unspecified HF chronicity, unspecified heart failure type  - concerned that Delene Loll is causing orthostatic hypotension  - reach out again to cardiology to discuss medication  - increase po fluids during the day   - continue current medications until receiving further guidance from cardiology     5. Paroxysmal atrial fibrillation  - continue current medications        Care Planning    Health Care Proxy:   Paperwork as part of Record? '[x]'$   Yes   '[]'$    No  '[]'$   Awaiting return of forms    Discussed Relevant Health Education Topics Listed Below:   x Importance of recommended health screenings   x Importance of recommended vaccines    x PSA/Prostate Cancer   x Bone Health/Calcium and  Vitamin D   x Appropriate Weight/Weight loss goals   x Regular Exercise    Smoking Cessation   x Healthcare proxy    x Living Will    Stress/Family issue     Follow up in about 6 months (around 08/13/2023) for Follow-up diabetes.    Georgiann Mccoy, MD, MPH  [facility]  Dept of [facility]

## 2023-02-14 LAB — AEROBIC CULTURE

## 2023-02-18 NOTE — Telephone Encounter (Signed)
Pt continues to drink a good amount of fluids. He is feeling much better since stopping entresto. Bps are in the upper 100s and he no longer is dizzy. He will stay off entresto until his OV and we can re-evaluate.

## 2023-02-19 ENCOUNTER — Other Ambulatory Visit: Payer: Self-pay | Admitting: Urology

## 2023-02-19 DIAGNOSIS — R3989 Other symptoms and signs involving the genitourinary system: Secondary | ICD-10-CM

## 2023-02-20 ENCOUNTER — Encounter: Payer: Self-pay | Admitting: Urology

## 2023-02-20 ENCOUNTER — Other Ambulatory Visit: Payer: Self-pay

## 2023-02-20 ENCOUNTER — Ambulatory Visit: Payer: Medicare (Managed Care) | Attending: Urology | Admitting: Urology

## 2023-02-20 VITALS — BP 104/59 | HR 92 | Ht 69.0 in | Wt 180.0 lb

## 2023-02-20 DIAGNOSIS — N200 Calculus of kidney: Secondary | ICD-10-CM | POA: Insufficient documentation

## 2023-02-20 DIAGNOSIS — R3989 Other symptoms and signs involving the genitourinary system: Secondary | ICD-10-CM | POA: Insufficient documentation

## 2023-02-20 LAB — POCT URINALYSIS DIPSTICK
Blood,UA POCT: NEGATIVE
Exp date: 20240930
Glucose,UA POCT: 250 mg/dL — AB
Ketones,UA POCT: NEGATIVE mg/dL
Leuk Esterase,UA POCT: 2 — AB
Lot #: 70153302
Nitrite,UA POCT: NEGATIVE
PH,UA POCT: 5 (ref 5–8)
Protein,UA POCT: NEGATIVE mg/dL
Specific gravity,UA POCT: 1.02 (ref 1.002–1.030)

## 2023-02-20 NOTE — Progress Notes (Signed)
[facility] Follow Up Visit    Jon [name]  02/20/2023    Chief complaint : urinary frequency   Pt is accompanied by his daughter    HPI: Jon [name] is a 89 y.o. male who presents for follow up urinary frequency with dysuria and hematuria . Currently on finasteride. Previously on Myrbetriq which he did not feel that it helped with urinary frequency.   Scheduled for cystoscopy but imaging for hematuria evaluation showing right distal stones.  Has never had stones prior to this      Changes since prior visit :   PMH -   Past Medical History:   Diagnosis Date    Arthritis     BPH (benign prostatic hypertrophy)     CHF (congestive heart failure)     Clotting disorder     Dementia     mild    Diabetes mellitus     Paget's disease of bone     Palpitations     Protein S deficiency     On chronic anticoagulation    Sinus bradycardia     Varicella      PSH -   Past Surgical History:   Procedure Laterality Date    APPENDECTOMY      CHOLECYSTECTOMY      EYE SURGERY      GALLBLADDER SURGERY      JOINT REPLACEMENT      KNEE REPLACEMENT      SKIN GRAFT      TONSILLECTOMY       Medications -   Current Outpatient Medications   Medication Sig    acetaminophen (TYLENOL) 500 MG capsule Take 1 capsule (500 mg total) by mouth as needed    spironolactone (ALDACTONE) 25 mg tablet Take 1 tablet (25 mg total) by mouth daily    trospium (SANCTURA) 20 mg tablet Take 1 tablet (20 mg total) by mouth 2 times daily (before meals)    finasteride (PROSCAR) 5 mg tablet TAKE 1 TABLET BY MOUTH EVERY DAY. FOR USE BY MEN ONLY    OZEMPIC, 0.25 OR 0.5 MG/DOSE, pen INJECT 0.'5MG'$  SUBCUTANEOUSLY (UNDER THE SKIN) EVERY WEEK    memantine (NAMENDA) 10 MG tablet TAKE 1 TABLET BY MOUTH TWO TIMES DAILY    dofetilide (TIKOSYN) 250 MCG capsule Take 1 capsule (250 mcg total) by mouth 2 times daily    memantine (NAMENDA) 10 MG tablet Take 1 tablet (10 mg total) by mouth 2 times daily    Cinnamon 500 MG capsule Take 500 mg by mouth    cholecalciferol  (VITAMIN D-1000 MAX ST) 1,000 unit tablet Take 1 tablet (1,000 units total) by mouth daily    metFORMIN (GLUCOPHAGE) 500 mg tablet Take 1 tablet (500 mg total) by mouth 2 times daily (with meals)    apixaban (ELIQUIS) 5 mg tablet Take 1 tablet (5 mg total) by mouth every 12 hours    rosuvastatin (CRESTOR) 10 mg tablet Take 1 tablet (10 mg total) by mouth daily (with dinner)    pioglitazone (ACTOS) 15 mg tablet Take 1 tablet (15 mg total) by mouth daily    amoxicillin (AMOXIL) 500 mg capsule Take 1 capsule (500 mg total) by mouth 2 times daily  for Simple Infection of the Urinary Tract    generic DME Dispense 1 single point cane. Use as directed.    insulin pen needle (BD PEN NEEDLE NANO 2ND GEN) 32G X 4 MM Use 1 time per week.    blood glucose  monitor (FREESTYLE LITE) kit TEST AS DIRECTED    Lancets Micro Thin 33G MISC Use as directed once daily. Dispense lancets for one touch verio    blood glucose monitor w/Device KIT Test as directed    Coenzyme Q10 200 MG capsule Take 1 capsule (200 mg total) by mouth daily    Omega-3 Fatty Acids (FISH OIL) 1000 MG CAPS capsule Take 1 capsule (1 g total) by mouth daily    dofetilide (TIKOSYN) 250 MCG capsule Take 1 capsule (250 mcg total) by mouth 2 times daily    lancets (ONETOUCH ULTRASOFT) Use   2  times per day as instructed for blood glucose testing.    Blood Glucose Calibration (OT ULTRA/FASTTK CNTRL SOLN) SOLN As directed    CINNAMON PO Take by mouth    Multiple Vitamins-Minerals (MULTI VITAMIN/MINERALS PO) Take by mouth    cholecalciferol (VITAMIN D) 1000 UNIT tablet Take 2 tablets (2,000 units total) by mouth daily    metoprolol succinate ER (TOPROL-XL) 25 mg 24 hr tablet Take 1 tablet (25 mg total) by mouth every 12 hours    Sacubitril-Valsartan (ENTRESTO) 24-26 mg per tablet Take 1 tablet by mouth 2 times daily    empagliflozin (JARDIANCE) 10 mg tablet Take 1 tablet (10 mg total) by mouth every morning     Allergies - no change  ROS - no change    PHYSICAL  EXAMINATION  BP 104/59   Pulse 92   Ht 1.753 m ('5\' 9"'$ )   Wt 81.6 kg (180 lb)   BMI 26.58 kg/m   GENERAL:  No acute distress, well developed, well nourished  NEUROLOGIC:  Oriented to person, place, time, and situation  PSYCHIATRIC:  Normal mood and affect  HEENT:  Normocephalic, atraumatic. Trachea midline.   RESPIRATORY: Respirations non-labored  SKIN:  Normal color      Recent Results (from the past 24 hour(s))   POCT urinalysis dipstick    Collection Time: 02/20/23  8:34 AM   Result Value Ref Range    Specific gravity,UA POCT 1.020 1.002 - 1.030    PH,UA POCT 5.0 5 - 8    Leuk Esterase,UA POCT +2 (!) Negative    Nitrite,UA POCT Negative Negative    Protein,UA POCT Negative Negative mg/dL    Glucose,UA POCT 250 mg/dL (!) Normal mg/dL    Ketones,UA POCT Negative Negative mg/dL    Urobilinogen,UA Test Not Performed Less than 1 mg/dL    Bilirubin,Ur Test Not Performed Negative, Test Not Performed    Blood,UA POCT Negative Negative    Exp date [id number]     Lot # [id number]        02/07/2023 3:33 PM     CT ABDOMEN AND PELVIS WITHOUT CONTRAST     CLINICAL INFORMATION: Hematuria, gross/macroscopic, R31.29-Other microscopic hematuria.     COMPARISON: CT abdomen and pelvis dated 01/19/2017.[name]     PROCEDURE:  Contiguous images were obtained through the abdomen and pelvis without intravenous contrast.  Automated exposure control, adjustment of the mA and/or kV according to patient size, and/or iterative reconstruction techniques were utilized for   radiation dose optimization.     FINDINGS:     There is minimal right lung basilar atelectasis. The heart appears normal in size with no pericardial effusion.     The liver is normal in  size and configuration with regular margin. The hepatic parenchyma shows normal homogeneous density without focal space occupying lesion or intrahepatic biliary duct dilatation.     The gallbladder is surgically  absent. The distal esophagus and stomach appears unremarkable.     There is mild  pancreatic atrophy seen. The spleen and adrenal glands are normal in size and attenuation characteristics.     There is a 6 mm and a 3 mm stone in the distal right ureter series 2, image 153 and 151) proximal to the right vesicoureteric junction. There is no nephrolithiasis, hydronephrosis or hydroureter in either kidney.     The small and large bowel loops are normal in caliber. There is diverticulosis of the sigmoid colon without acute diverticulitis. There is no focal or diffuse wall thickening or pericolonic fat stranding. The appendix is surgically absent.     The aorta shows a normal diameter with age appropriate atherosclerotic changes.  There is no lymphadenopathy by size criteria.     There is a 2.8 x 1.8 x 2.4 cm bladder diverticulum near the apex of the urinary bladder. The urinary bladder is otherwise normally distended and shows homogeneous density. T     here are coarse calcifications in the prostate which otherwise appears normal in size. The seminal vesicles show no definite CT abnormality.     There is no free fluid or free air. The anterior abdominal wall appears unremarkable.     There is trabecular thickening and sclerosis of L5 vertebral body and left proximal femur, similar to the prior and likely represents Paget's disease. There is multilevel degenerative changes of the visualized spine with otherwise no suspicious focal   blastic or lytic areas.     Impression:     A 6 mm and a 3 mm stone in the distal right ureter proximal to the right vesicoureteric junction without hydroureteronephrosis.     Small urinary bladder diverticulum.     Mild sigmoid diverticulosis.     Unchanged trabecular thickening and sclerosis of L5 vertebral body and left proximal femur likely represents Paget's disease.       END OF IMPRESSION         Plan of care -  BPH with LUTS  - Continue finasteride       2. Kidney Stones   - Discussed ureteroscopy with patient and daughter including risks, benefits and  alternatives  - Discussed typical post-op course including a need for a stent  - Agreeable to proceed and will plan for the OR

## 2023-02-20 NOTE — Patient Instructions (Signed)
Cystoscopy Frequently Asked Questions    Indication:  To evaluate blood in the urine, to assess possible causes of incontinence.    Do we use a numbing agent?   We may use 2% lidocaine jelly, depending on your diagnosis.  It is a gel that is placed into the urethra.  No needle is used.  The numbing effect will last 1 to 2 hours.  Patient will still be able to feel the urge to urinate.    Should I eat or drink the day of the procedure?  Yes!  Please eat and drink as normal the day of the procedure.  Being well hydrated will help the doctor better visualize the ureter and kidney function.    Are there any medications I should stop taking before the procedure?  No.  You may take all your medications.    How long will the procedure take?  You should plan to be in the office for at least an hour to allow time for paperwork and prep.  From the time the doctor enters the room, the procedure itself takes between 5 to 15 minutes to complete.    What will happen during my procedure?  You will be asked to provide a urine specimen prior to the procedure to ensure there is no current infection.  If you are found to have an infection, the procedure may be rescheduled at the Doctors discretion.  You will be brought back to a procedure room, vital signs will be taken, and you will be asked to sign a consent form.  You will then be asked to undress from the waist down, and you will lie down on the procedure table.  Your genitals will be cleansed with an antibacterial soap, and the lidocaine gel will be instilled into the urethra.  The doctor will insert the cystoscope into the bladder via the urethra.  He will be able to visualize the urethra, the bladder and the ureters.  A sample of the fluid taken from the bladder will be sent to pathology to look for any abnormal cells.  The result takes 7-10 days to come back.  We will either call you with the result, or your doctor may have you return to the office to discuss the results of  all tests.    What kind of scope is used?  We use a flexible cystoscope in our office.    Will it hurt?  Most people feel a large amount of pressure, a strong urge to urinate and a little burning along the urethra, although the experience is is different for everyone.      Will I have antibiotics afterward?  This will be at the discretion of your doctor.    How long before I may resume sexual activity?  The same day.      Will I have any after effects?  You may experience burning with urination and a small amount of blood in the urine for a few days afterwards.  This is normal.  Drink plenty of fluids for a few days to keep the urinary tract flushed out.    Contact your Doctor if you develop the following:  · Excessive pain  · Prolonged excessive bleeding or temperature greater then 101 degrees fahrenheit     Will I need to be out of work or restrict my activities for any length of time afterward?  You may return to all normal activities the same day.

## 2023-02-20 NOTE — Procedures (Signed)
PRE-PROCEDURE TIME OUT    What procedure is being performed? Cysto  Correct procedure? yes  Correct Patient (use 2 identifiers)? yes  Correct Site? yes  Site Marked? N/A  Correct Side? N/A  Correct patient position? yes  Consent verified? yes  Tests results available?N/A  Imaging results available? N/A  List participants involved in time out. Dr. Felicita Gage, [place]  Availability of correct instruments and any special equipment or requirements? yes

## 2023-02-21 ENCOUNTER — Telehealth: Payer: Self-pay | Admitting: Urology

## 2023-02-21 LAB — AEROBIC CULTURE

## 2023-02-21 NOTE — Telephone Encounter (Signed)
Copied from [place] [id number]. Topic: Access to Care - Speak to Provider/Office Staff  >> Feb 21, 2023 12:13 PM Suzanna Obey wrote:      Tessie Eke -daughter The patient is requesting to speak with Dr. Danton Sewer office to schedule surgery for patient's kidney stones.     Tessie Eke -daughter stated that this is regarding wanting to schedule father for soonest possible date since he's in a lot of pain.    Please call Tessie Eke -daughter back at [phone] to discuss.

## 2023-02-22 NOTE — Telephone Encounter (Signed)
Copied from [place] [id number]. Topic: Access to Care - Speak to Provider/Office Staff  >> Feb 22, 2023 10:28 AM Suzanna Obey wrote:      Jon [name] -daughter is requesting to speak with Dr. Danton Sewer office.     Jon [name] -daughter stated that this is regarding needing to speak with a surgical scheduler to have her father scheduled for kidney stone surgery.    Jon [name] -daughter states that her father is in a lot of pain and wants to schedule father for the soonest possible surgical appointment.    Please call the patient back at [phone] to discuss.

## 2023-02-23 LAB — AEROBIC CULTURE

## 2023-02-25 ENCOUNTER — Telehealth: Payer: Self-pay | Admitting: Urology

## 2023-02-25 ENCOUNTER — Other Ambulatory Visit: Payer: Self-pay | Admitting: Urology

## 2023-02-25 MED ORDER — CEPHALEXIN 500 MG PO CAPS *I*
500.0000 mg | ORAL_CAPSULE | Freq: Three times a day (TID) | ORAL | 0 refills | Status: AC
Start: 2023-02-25 — End: 2023-03-04

## 2023-02-25 NOTE — Telephone Encounter (Signed)
Called patient to offer them a surgical date with Dr. Gust Rung at [facility] - [address], [place], [place] [address].    Spoke to patient's wife about possibilty scheduling on 3/19

## 2023-02-25 NOTE — Telephone Encounter (Signed)
Copied from [place] [phone]. Topic: Appointments - Appointment Information  >> Feb 25, 2023 10:11 AM Coolidge Breeze wrote:  Tessie Eke - Daughter not on HIPAA is calling again upset that they have not had a call back for surgery scheduling  She states that Dr. Gust Rung said he can see any doctor, they want the first available     Please call the patient at [phone]

## 2023-02-25 NOTE — Telephone Encounter (Signed)
-----   Message from Albertha Ghee, MD sent at 02/25/2023  3:03 PM EDT -----  Hi    His culture is positive - I sent in a course of keflex for him    Wille Glaser

## 2023-02-25 NOTE — Telephone Encounter (Signed)
Called and spoke with wife (HIPAA) and advised of message below.

## 2023-02-25 NOTE — Result Encounter Note (Signed)
Hi    His culture is positive - I sent in a course of keflex for him    Wille Glaser

## 2023-02-27 NOTE — Telephone Encounter (Signed)
Called patient to offer them a surgical date of 03/13/23 with Dr. Wilma Flavin at [facility] - [address], [place], [place] [address].    Patient confirmed the date with writer and requested instructions be sent to their home address.    Letter mailed to home address on 02/28/23     Post-Op approximate timeline 4-6 Weeks. Scheduled for 04/17/23.

## 2023-02-28 ENCOUNTER — Encounter: Payer: Self-pay | Admitting: Urology

## 2023-03-06 ENCOUNTER — Telehealth: Payer: Self-pay | Admitting: Urology

## 2023-03-06 NOTE — Telephone Encounter (Signed)
Copied from CRM [id number]. Topic: Medications/Prescriptions - Medication Question/Problem  >> Mar 06, 2023  4:02 PM Sheliah Mends wrote:  Jon [name] the patients spouse is calling to speak with a nurse to discuss the upcoming kidney stone procedure. She would like to know if Dr. Wilma Flavin wants the apixaban (ELIQUIS) 5 mg tablet held prior to surgery and for how long. She would also like to discuss how the discovery was reached for the Kidney stones whether it was found through lab work or imaging. Jon [name] can be reached at [phone]

## 2023-03-07 ENCOUNTER — Other Ambulatory Visit: Payer: Self-pay | Admitting: Primary Care

## 2023-03-07 DIAGNOSIS — E1169 Type 2 diabetes mellitus with other specified complication: Secondary | ICD-10-CM

## 2023-03-07 NOTE — Telephone Encounter (Signed)
Writer called and spoke with spouse and answered question regarding eliquis. Wife stated that patient did not get instructions that were mailed out to them. Writer verified address on file. Wife also advised that imaging is what confirms that there is a stone. No further questions at this time.

## 2023-03-08 NOTE — Telephone Encounter (Signed)
[facility] Refill Note  03/08/23  Jon [name]  1934/09/30  MRN: [id number]    Last office visit: 02/12/2023    Last telehome visit: 07/23/2022    Recent Visits  Date Type Provider Dept   02/12/23 Office Visit Tillie Rung, MD [facility]   01/14/23 Office Visit Tillie Rung, MD [facility]   Showing recent visits within past 185 days in an active department and meeting all other requirements  Future Appointments  No visits were found meeting these conditions.  Showing future appointments within next 0 days in an active department and meeting all other requirements      LABORATORY DATA:  Last Renal Func was  02/08/2023: Creatinine 1.40 mg/dL (H; Ref range: 2.95 - 6.21 mg/dL); eGFR BY CREAT 49 * (!; Ref range: *); Potassium 4.9 mmol/L (Ref range: 3.3 - 5.1 mmol/L).   Last LFTs was  11/29/2021: ALT 18 U/L (Ref range: 0 - 50 U/L); AST 20 U/L (Ref range: 0 - 50 U/L).   Last Lipid panel was 11/29/2021: Cholesterol 109 mg/dL (Ref range: mg/dL); HDL 35 mg/dL (L; Ref range: 40 - 60 mg/dL); LDL Calculated 43 mg/dL (Ref range: mg/dL).  Last A1C was 02/08/2023: Hemoglobin A1C 6.7 % (H; Ref range: %).  Last TFT was 02/08/2023: TSH 5.65 uIU/mL (H; Ref range: 0.27 - 4.20 uIU/mL), 11/29/2021: Free T4 1.1 ng/dL (Ref range: 0.9 - 1.7 ng/dL).    [name]

## 2023-03-11 NOTE — Invasive Procedure Plan of Care (Signed)
CONSENT FOR MEDICAL  OR SURGICAL PROCEDURE                            Patient Name: Jon [name]  [facility] [address]                                                              DOB: Oct 20, 1934         Please read this form or have someone read it to you.   It's important to understand all parts of this form. If something isn't clear, ask [place] to explain.   When you sign it, that means you understand the form and give [place] permission to do this surgery or procedure.     I agree for Lenard Lance, MD along with any assistants* they may choose, to treat the following condition(s): Ureteral/kidney stone   By doing this surgery or procedure on me: Insertion of lighted telescopes into the urinary system to localize the stone and possibly breaking the stone into small fragments with a laser; possible removal of stone fragments with a basket; possible placement of a ureteral stent   This is also known as: Ureteroscopy with/without laser; possible removal of stone fragments with a basket; possible stent placement     Laterality: Right     *if you'd like a list of the assistants, please ask. We can give that to you.    1. The care provider has explained my condition to me. They have told me how the procedure can help me. They have told me about other ways of treating my condition. I understand the care provider cannot guarantee the result of the procedure. If I don't have this procedure, my other choices are: No surgery, percutaneous nephrolithotripsy (PCNL), open lithotomy, extracorporeal shockwave lithotripsy (ESWL), stone basketing.     2. The care provider has told me the risks (problems that can happen) of the procedure. I understand there may be unwanted results. The risks that are related to this procedure include: Bleeding; infection; damage to urethra, prostate, ureter, bladder or kidney; stricture of urethra or ureter - risk of injuring ureter that may require external or internal drainage or open surgical  repair; inability to reach stone or stone fragment; failure to diagnose or cure condition. Risk of anesthesia; heart attack; stroke; blood clot in legs, lungs.    3. I understand that during the procedure, my care provider may find a condition that we didn't know about before the treatment started. Therefore, I agree that my care provider can perform any other treatment which they think is necessary and available.    4. I give permission to the hospital and/or its departments to examine and keep tissue, blood, body parts, fluids or materials removed from my body during the procedure(s) to aid in diagnosis and treatment, after which they may be used for scientific research or teaching by appropriate persons. If these materials are used for science or teaching, my identity will be protected. I will no longer own or have any rights to these materials regardless of how they may be used.    5. My care provider might want a representative from a medical device company to be there during my procedure. I understand that  person works for:          The ways they might help my care provider during my procedure include:            6. Here are my decisions about receiving blood, blood products, or tissues. I understand my decisions cover the time before, during and after my procedure, my treatment, and my time in the hospital. After my procedure, if my condition changes a lot, my care provider will talk with me again about receiving blood or blood products. At that time, my care provider might need me to review and sign another consent form, about getting or refusing blood.    I understand that the blood is from the community blood supply. Volunteers donated the blood, the volunteers were screened for health problems. The blood was examined with very sensitive and accurate tests to look for hepatitis, HIV/AIDS, and other diseases. Before I receive blood, it is tested again to make sure it is the correct type.    My chances of  getting a sickness from blood products are small. But no transfusion is 100% safe. I understand that my care provider feels the good I will receive from the blood is greater than the chances of something going wrong. My care provider has answered my questions about blood products.      My decision  about blood or  blood products   N/A        My decision   about tissue  Implants     N/A          I understand this  form.    My care provider  or his/her  assistants have  explained:   What I am having done and why I need it.  What other choices I can make instead of having this done.  The benefits and possible risks (problems) to me of having this done.  The benefits and possible risks (problems) to me of receiving transplants, blood, or blood products.  There is no guarantee of the results.  The care provider may not stay with me the entire time that I am in the operating or procedure room.  My provider has explained how this may affect my procedure. My provider has answered my  questions about this.         I give my  permission for  this surgery or  procedure.            _______________________________________________                                     My signature  (or parent or other person authorized to sign for you, if you are unable to sign for  yourself or if you are under 48 years old)        ______           Date        _____        Time   Electronic Signatures will display at the bottom of the consent form.    Care provider's statement: I have discussed the planned procedure, including the possibility for transfusion of blood  products or receipt of tissue as necessary; expected benefits; the possible complications and risks; and possible alternatives  and their benefits and risks with the patients or the patient's surrogate. In my opinion, the patient or the patient's surrogate  understands  the proposed procedure, its risks, benefits and alternatives.              Electronically signed by: Lenard Lance, MD                                                03/11/2023         Date        2:24 PM        Time

## 2023-03-13 ENCOUNTER — Ambulatory Visit: Payer: Medicare (Managed Care) | Admitting: Radiology

## 2023-03-13 ENCOUNTER — Other Ambulatory Visit: Payer: Self-pay

## 2023-03-13 ENCOUNTER — Ambulatory Visit: Payer: Medicare (Managed Care) | Admitting: Anesthesiology

## 2023-03-13 ENCOUNTER — Ambulatory Visit
Admission: RE | Admit: 2023-03-13 | Discharge: 2023-03-13 | Disposition: A | Discharge status: Home / Self Care | Payer: Medicare (Managed Care) | Source: Ambulatory Visit | Attending: Urology | Admitting: Urology

## 2023-03-13 ENCOUNTER — Encounter
Admission: RE | Disposition: A | Discharge status: Home / Self Care | Payer: Self-pay | Source: Ambulatory Visit | Attending: Urology

## 2023-03-13 DIAGNOSIS — F03A0 Unspecified dementia, mild, without behavioral disturbance, psychotic disturbance, mood disturbance, and anxiety: Secondary | ICD-10-CM | POA: Insufficient documentation

## 2023-03-13 DIAGNOSIS — E119 Type 2 diabetes mellitus without complications: Secondary | ICD-10-CM | POA: Insufficient documentation

## 2023-03-13 DIAGNOSIS — I110 Hypertensive heart disease with heart failure: Secondary | ICD-10-CM | POA: Insufficient documentation

## 2023-03-13 DIAGNOSIS — N201 Calculus of ureter: Secondary | ICD-10-CM | POA: Insufficient documentation

## 2023-03-13 DIAGNOSIS — K219 Gastro-esophageal reflux disease without esophagitis: Secondary | ICD-10-CM | POA: Insufficient documentation

## 2023-03-13 DIAGNOSIS — I509 Heart failure, unspecified: Secondary | ICD-10-CM | POA: Insufficient documentation

## 2023-03-13 DIAGNOSIS — N200 Calculus of kidney: Secondary | ICD-10-CM

## 2023-03-13 HISTORY — PX: PR CYSTO W/URETEROSCOPY W/LITHOTRIPSY: 52353

## 2023-03-13 LAB — POCT GLUCOSE: Glucose POCT: 138 mg/dL — ABNORMAL HIGH (ref 60–99)

## 2023-03-13 SURGERY — URETEROSCOPY, USING HOLMIUM LASER
Anesthesia: General | Site: Abdomen | Laterality: Right | Wound class: Clean Contaminated

## 2023-03-13 MED ORDER — DEXAMETHASONE SODIUM PHOSPHATE 4 MG/ML INJ SOLN *WRAPPED*
INTRAMUSCULAR | Status: DC | PRN
Start: 2023-03-13 — End: 2023-03-13
  Administered 2023-03-13: 4 mg via INTRAVENOUS

## 2023-03-13 MED ORDER — HALOPERIDOL LACTATE 5 MG/ML IJ SOLN *I*
0.5000 mg | Freq: Once | INTRAMUSCULAR | Status: DC | PRN
Start: 2023-03-13 — End: 2023-03-13

## 2023-03-13 MED ORDER — LACTATED RINGERS IV SOLN *I*
125.0000 mL/h | INTRAVENOUS | Status: DC
Start: 2023-03-13 — End: 2023-03-13

## 2023-03-13 MED ORDER — FENTANYL CITRATE 50 MCG/ML IJ SOLN *WRAPPED*
25.0000 ug | INTRAMUSCULAR | Status: DC | PRN
Start: 2023-03-13 — End: 2023-03-13

## 2023-03-13 MED ORDER — DEXTROSE 5 % FLUSH FOR PUMPS *I*
0.0000 mL/h | INTRAVENOUS | Status: DC | PRN
Start: 2023-03-13 — End: 2023-03-13

## 2023-03-13 MED ORDER — EPHEDRINE 5MG/ML IN NS IV/IJ *WRAPPED*
INTRAMUSCULAR | Status: DC | PRN
Start: 2023-03-13 — End: 2023-03-13
  Administered 2023-03-13: 2.5 mg via INTRAVENOUS

## 2023-03-13 MED ORDER — LIDOCAINE HCL 2 % IJ SOLN *I*
INTRAMUSCULAR | Status: DC | PRN
Start: 2023-03-13 — End: 2023-03-13
  Administered 2023-03-13: 60 mg via INTRAVENOUS

## 2023-03-13 MED ORDER — ONDANSETRON HCL 2 MG/ML IV SOLN *I*
INTRAMUSCULAR | Status: DC | PRN
Start: 2023-03-13 — End: 2023-03-13
  Administered 2023-03-13: 4 mg via INTRAVENOUS

## 2023-03-13 MED ORDER — LACTATED RINGERS IV SOLN *I*
INTRAVENOUS | Status: DC | PRN
Start: 2023-03-13 — End: 2023-03-13

## 2023-03-13 MED ORDER — LIDOCAINE HCL (PF) 1 % IJ SOLN *I*
0.1000 mL | INTRAMUSCULAR | Status: DC | PRN
Start: 2023-03-13 — End: 2023-03-13
  Administered 2023-03-13: 0.1 mL via SUBCUTANEOUS
  Filled 2023-03-13: qty 2

## 2023-03-13 MED ORDER — CEFAZOLIN 2000 MG IN STERILE WATER 20ML SYRINGE *I*
2000.0000 mg | PREFILLED_SYRINGE | Freq: Once | INTRAVENOUS | Status: DC
Start: 2023-03-13 — End: 2023-03-13
  Filled 2023-03-13: qty 20

## 2023-03-13 MED ORDER — PROPOFOL 10 MG/ML IV EMUL (INTERMITTENT DOSING) WRAPPED *I*
INTRAVENOUS | Status: DC | PRN
Start: 2023-03-13 — End: 2023-03-13
  Administered 2023-03-13: 20 mg via INTRAVENOUS
  Administered 2023-03-13: 110 mg via INTRAVENOUS

## 2023-03-13 MED ORDER — ACETAMINOPHEN 500 MG PO TABS *I*
1000.0000 mg | ORAL_TABLET | Freq: Once | ORAL | Status: DC
Start: 2023-03-13 — End: 2023-03-13

## 2023-03-13 MED ORDER — FENTANYL CITRATE 50 MCG/ML IJ SOLN *WRAPPED*
INTRAMUSCULAR | Status: DC | PRN
Start: 2023-03-13 — End: 2023-03-13
  Administered 2023-03-13 (×2): 25 ug via INTRAVENOUS
  Administered 2023-03-13: 50 ug via INTRAVENOUS

## 2023-03-13 MED ORDER — SODIUM CHLORIDE 0.9 % IV SOLN WRAPPED *I*
20.0000 mL/h | Status: DC
Start: 2023-03-13 — End: 2023-03-13

## 2023-03-13 MED ORDER — LACTATED RINGERS IV SOLN *I*
20.0000 mL/h | INTRAVENOUS | Status: DC
Start: 2023-03-13 — End: 2023-03-13
  Administered 2023-03-13: 20 mL/h via INTRAVENOUS

## 2023-03-13 MED ORDER — FENTANYL CITRATE 50 MCG/ML IJ SOLN *WRAPPED*
INTRAMUSCULAR | Status: AC
Start: 2023-03-13 — End: 2023-03-13
  Filled 2023-03-13: qty 2

## 2023-03-13 MED ORDER — CEFAZOLIN 1000 MG IN STERILE WATER 10ML SYRINGE *I*
PREFILLED_SYRINGE | INTRAVENOUS | Status: DC | PRN
  Administered 2023-03-13: 2000 mg via INTRAVENOUS

## 2023-03-13 MED ORDER — SODIUM CHLORIDE 0.9 % FLUSH FOR PUMPS *I*
0.0000 mL/h | INTRAVENOUS | Status: DC | PRN
Start: 2023-03-13 — End: 2023-03-13

## 2023-03-13 MED ORDER — PHENAZOPYRIDINE HCL 100 MG PO TABS *I*
100.0000 mg | ORAL_TABLET | Freq: Three times a day (TID) | ORAL | 0 refills | Status: AC | PRN
Start: 2023-03-13 — End: 2023-03-15
  Filled 2023-03-13: qty 6, 2d supply, fill #0

## 2023-03-13 MED ORDER — MIDAZOLAM HCL 1 MG/ML IJ SOLN *I* WRAPPED
INTRAMUSCULAR | Status: AC
Start: 2023-03-13 — End: 2023-03-13
  Filled 2023-03-13: qty 2

## 2023-03-13 MED ORDER — ALBUTEROL SULFATE (2.5 MG/3ML) 0.083% IN NEBU *I*
2.5000 mg | INHALATION_SOLUTION | Freq: Once | RESPIRATORY_TRACT | Status: DC | PRN
Start: 2023-03-13 — End: 2023-03-13

## 2023-03-13 SURGICAL SUPPLY — 31 items
BAG DRAIN URINARY CATCH FOR BLK RING (Supply) ×1 IMPLANT
CATH URETERAL DUAL LUMEN (Catheter) IMPLANT
CATH URETERAL OPEN END 5FR (Catheter) IMPLANT
CONTRAST OMNIPAQUE PLUSPAK 240MG 50ML (Supply) IMPLANT
DRESSING TEGADERM W/LABEL 4 X 4 3/4IN (Dressing) ×1 IMPLANT
EXTRACTOR STONE NITINOL 1.9FR (Supply) ×1 IMPLANT
FIBER LASER 150 BALLTIP MICRON (Supply) IMPLANT
FIBER LASER 200 BALLTIP MICRON (Supply) IMPLANT
FIBER LASER 200 MICRON (Supply) IMPLANT
FIBER LASER 365 MICRON (Supply) IMPLANT
FIBER LASER 550 MICRON (Supply) IMPLANT
FIBER LASER 940 MICRON (Supply) IMPLANT
FIBER LASER MICRON 200 SINGLE USE (Supply) IMPLANT
FIBER LASER MICRON 272 SINGLE USE (Supply) IMPLANT
FIBER LASER MICRON 365 SINGLE USE (Supply) IMPLANT
FIBER LASER OPTICAL 272UM BALL TIP DISP (Supply) ×1 IMPLANT
FIBER SURG LASER DIA 200UM OPTICAL DISP (Supply) IMPLANT
GLOVE BIOGEL PI ULTRATOUCH M SZ 7.5 LF (Glove) ×1 IMPLANT
GUIDEWIRE DUAL FLEX STR .035X150CM SENSOR (Guidewire) ×1 IMPLANT
KIT UROLOGICAL STENT L26CM DIA6FR DOUBLE PIGTAIL RADIOPAQUE THREADED HYDROPHILIC FLEXIBLE TIP WITH POSITIONER WITHOUT GUIDEWIRE PERCUFLEX UNIVERSAL SOFT (Stent (Non-Cardiac)) ×1 IMPLANT
PACK CUSTOM CYSTO (Pack) ×1 IMPLANT
SEAL PORT BIOPSY Y-ADAPTER (Supply) IMPLANT
SHEATH ACCESS 10/12FR X 35CM (Sheath) IMPLANT
SHEATH ACCESS 10/12FR X 55CM (Sheath) IMPLANT
SHEATH ACCESS 12/14/18FR X 35CM (Sheath) IMPLANT
SHEATH UROPASS URETERAL ACCESS 24CM (Sheath) IMPLANT
SHEATH UROPASS URETERAL ACCESS 38CM (Sheath) IMPLANT
SLEEVE COMP KNEE MED BLENDED (Supply) ×1 IMPLANT
SOL SOD CHL IRRIG .9PCT 3000ML BAG (Solution) ×1 IMPLANT
SOL SOD CHL IV .9PCT 1000ML BAG (Drug) ×1 IMPLANT
SOL SODIUM CHLORIDE IRRIG 1000ML BTL (Solution) ×1 IMPLANT

## 2023-03-13 NOTE — Op Note (Signed)
Operative Note (Surgical Case/Log ID: [id number])       Date of Surgery: 03/13/2023       Surgeons: Surgeons and Role:     * Masashi Snowdon, Gibson Ramp, MD - Primary   Assistants:         Pre-op Diagnosis: Pre-Op Diagnosis Codes:     * Kidney stone [N20.0]       Post-op Diagnosis: Post-Op Diagnosis Codes:     * Kidney stone [N20.0]       Procedure(s) Performed: Procedure(s) (LRB):  RIGHT URETEROSCOPY, USING HOLMIUM LASER WITH STENT (Right)       Anesthesia Type: General        Fluid Totals: I/O this shift:  03/27 0700 - 03/27 1459  In: 1000 (12.2 mL/kg) [I.V.:1000]  Out: - (0 mL/kg)   Net: 1000  Weight: 81.6 kg        Estimated Blood Loss: * No values recorded between 03/13/2023 10:44 AM and 03/13/2023 11:26 AM *       Specimens to Pathology:  ID Type Source Tests Collected by Time Destination   1 : Right ureteral stone For Composition Analysis STONE Stone STONE  LYSIS Richard Miu, RN 03/13/2023 1125           Temporary Implants: Implant: Kit Urological Stent L26cm [id number] Double Pigtail Radiopaque Threaded Hydrophilic Flexible Tip With Positioner Without Guidewire Percuflex Universal Soft - [id number]. Anticipated Removal Date:03/20/2023.        Packing:                 Patient Condition: good       Indications: Right distal ureteral stones       Findings (Including unexpected complications): Right distal ureteral stones     Preop Diagnosis: Right ureteral stones     Postop Diagnosis: Same    Procedures Performed: Cystoscopy, right ureteroscopy, laser litho, stone extraction, stent     Complications: None    Drains: 6x26 JJ stent     Specimens: Stone for analysis    Dispo: Home    Indications: 89 y.o. year old with 7 mm stone located in the right distal ureter.    Procedure in Detail: The patient was consented prior to the procedure. He was brought back to the OR. Preoperative antibiotics were given and SCDs were placed for DVT prophylaxis. The patient was positioned in dorsal lithotomy after undergoing anesthesia  induction. The patient was prepped and draped in the standard fashion. A time-out was taken verifying correct patient, procedure and side. Preoperative imaging was available within the room and all team members were present for time-out.     A full cystoscopy was performed by introducing a rigid cystoscope through the urethra. The urethra and bladder were both normal in appearance. The right ureteral orifice was then cannulated with a sensor wire under fluoroscopy and the wire was advanced into the renal pelvis. The semi-rigid ureteroscope was then advanced into the bladder and followed the wire into the ureteral orifice and into the proximal ureter. The ureteroscope was advanced until the stone was encountered and using a 270 micron laser fiber fragmented into two pieces thought able to pass. Using a basket, these pieces along with a smaller separate stone were removed. The ureteroscope was then advanced as far proximally as possible to inspect the ureter for any other stones or fragments and none were seen. The ureteroscope was then brought out inspecting the ureter along the way and no other fragments, strictures or other lesions were seen.  A 6x26 JJ stent was then placed witha string attached under fluoroscopic guidance without any difficulty. At the end of the procedure, the patient's bladder was emptied and He was returned to supine position.    The patient was awakened and transferred to PACU in satisfactory condition.      Signed:  Lenard Lance, MD  on 03/13/2023 at 11:26 AM

## 2023-03-13 NOTE — Discharge Instructions (Signed)
Discharge Instructions    Surgeon: Lenard Lance    Office phone number: [phone]    Date of Discharge:  03/13/23    Procedure: Right ureteroscopy, laser litho, stone extraction and stent    Diet: general    Activity: No restrictions  Please rest after surgery, avoiding any strenuous activity. In 1-2 weeks you may resume regular activity as tolerated.  May remove stent on string on 03/20/23    Medications: may resume home medications.   Cont trospium  Pyridium    Call or go to the ED if:   Fever of 101F. or greater  Shaking chills  Nausea and / or vomiting  Diarrhea  Uncontrolled pain  Not able to have a BM

## 2023-03-13 NOTE — Preop H&P (Signed)
UPDATES TO PATIENT'S CONDITION on the DAY OF SURGERY/PROCEDURE    I. Updates to Patient's Condition (to be completed by a provider privileged to complete a H&P, following reassessment of the patient by the provider):    Day of Surgery/Procedure Update:  History  History reviewed and no change    Physical  Physical exam updated and no change            II. Procedure Readiness   I have reviewed the patient's H&P and updated condition. By completing and signing this form, I attest that this patient is ready for surgery/procedure.    III. Attestation   I have reviewed the updated information regarding the patient's condition and it is appropriate to proceed with the planned surgery/procedure.      Lenard Lance, MD as of 10:44 AM 03/13/2023

## 2023-03-13 NOTE — Anesthesia Preprocedure Evaluation (Addendum)
Anesthesia Pre-operative History and Physical for Jon [name]    Highlighted Issues for this Procedure:  89 y.o. male with Kidney stone presenting for RIGHT URETEROSCOPY, USING HOLMIUM LASER WITH STENT (Right) by Lenard Lance, MD for 90 minutes.        EFT 55%, no significant valvular issues on recent TTE    .  [name]  Anesthesia Evaluation Information Source: patient, family, records     ANESTHESIA HISTORY  Pertinent(-):  No History of anesthetic complications or Family hx of anesthetic complications    GENERAL  Pertinent (-):  No obesity, history of anesthetic complications or Family Hx of Anesthetic Complications     PULMONARY  Pertinent(-):  No smoking or asthma    CARDIOVASCULAR  Good(4+METs) Exercise Tolerance    + Hypertension    + Cardiac Testing          echo, 55% ejection fraction    + Anticoagulants    + Hx of Dysrhythmias    + CHF  Pertinent(-):  No vascular Issues or hx of DVT    GI/HEPATIC/RENAL   NPO: > 8hrs ago (solids) and > 2hrs ago (clears)      + GERD    + Renal Issues          CKD, hx of kidney stones NEURO/PSYCH/ORTHO    + Chronic pain          lower back    + Dementia    ENDO/OTHER    + Diabetes Mellitus    HEMATOLOGIC    + Coagulopathy    + Arthritis       Physical Exam    Airway            Mouth opening: normal            Mallampati: II            TM distance (fb): >3 FB            Neck ROM: full            Airway Impression: easy  Dental   Normal Exam   Upper: partial Lower: partial   Cardiovascular  Normal Exam           Rhythm: regular           Rate: normal      Neurologic    Normal Exam    General Survey    Normal Exam   Pulmonary   Normal Exam    breath sounds clear to auscultation    Mental Status   Normal Exam         ________________________________________________________________________  PLAN  ASA Score  3  Anesthetic Plan general       Induction (routine IV) General Anesthesia/Sedation Maintenance Plan (inhaled agents, propofol infusion and IV bolus);  Airway  Manipulation (none); Airway (LMA); Line ( use current access); Monitoring (standard ASA); Positioning (supine and lithotomy); PONV Plan (dexamethasone, ondansetron and propofol infusion); Pain (per surgical team, intraop local and PO Tylenol); PostOp (PACU)Standard Attestation    Informed Consent     Risks:          Risks discussed were commensurate with the plan listed above with the following specific points: N/V, aspiration, sore throat, hypotension and emergence delirium, Damage to: eyes and teeth, allergic Rx and unexpected serious injury.    Anesthetic Consent:         Anesthetic plan (and risks as noted above) were discussed with patient and adult children  Responsible Anesthesia Provider Attestation:  I attest that the patient or proxy understands and accepts the risks and benefits of the anesthesia plan. I also attest that I have personally performed a pre-anesthetic examination and evaluation, and prescribed the anesthetic plan for this particular location within 48 hours prior to the anesthetic as documented. Delancey Moraes Mosetta Anis, MD  03/13/23, 11:08 AM

## 2023-03-13 NOTE — Anesthesia Postprocedure Evaluation (Signed)
Anesthesia Post-Op Note    Patient: Jon [name]    Procedure(s) Performed:  Procedure Summary  Date:  03/13/2023 Anesthesia Start: 03/13/2023 10:44 AM Anesthesia Stop: 03/13/2023 11:40 AM Room / Location:  [facility]_OR_14 / [facility] MAIN OR   Procedure(s):  RIGHT URETEROSCOPY, USING HOLMIUM LASER WITH STENT Diagnosis:  Kidney stone [N20.0] Surgeon(s):  Bloom, Gibson Ramp, MD Responsible Anesthesia Provider:  Thayer Ohm, MD         Recovery Vitals  BP: 129/71 (03/13/2023  1:06 PM)  Heart Rate: 71 (03/13/2023  1:06 PM)  Heart Rate (via Pulse Ox): 69 (03/13/2023 12:15 PM)  Resp: 16 (03/13/2023  1:06 PM)  Temp: 36.2 C (97.2 F) (03/13/2023  1:06 PM)  SpO2: 98 % (03/13/2023  1:06 PM)  O2 Flow Rate: 8 L/min (03/13/2023 11:30 AM)   0-10 Scale: 0 (03/13/2023  1:06 PM)    Anesthesia type:  general  Complications Noted During Procedure or in PACU:  None   Comment:    Patient Location:  PACU  Level of Consciousness:    Recovered to baseline, alert, oriented and awake  Patient Participation:     Able to participate  Temperature Status:    Normothermic  Oxygen Saturation:    Within patient's normal range  Cardiac Status:   within patient's normal range and stable  Fluid Status:    Stable  Airway Patency:     Yes  Pulmonary Status:    Baseline and stable  Pain Management:    Adequate analgesia and satisfactory to patient  Nausea and Vomiting:  None    Post Op Assessment:    Tolerated procedure well and no evidence of recall  Responsible Anesthesia Provider Attestation:  All indicated post anesthesia care provided       -

## 2023-03-13 NOTE — Anesthesia Case Conclusion (Signed)
CASE CONCLUSION  Emergence  Actions:  Suctioned and LMA removed  Criteria Used for Airway Removal:  Adequate Tv & RR and acceptable O2 saturation  Assessment:  Routine  Transport  Directly to: PACU  Airway:  Facemask  Oxygen Delivery:  6 lpm  Position:  Recumbent  Patient Condition on Handoff  Level of Consciousness:  Mildly sedated  Patient Condition:  Stable  Handoff Report to:  RN

## 2023-03-13 NOTE — INTERIM OP NOTE (Signed)
Interim Op Note (Surgical Log ID: [id number])       Date of Surgery: 03/13/2023       Surgeons: Surgeons and Role:     * Ashanty Coltrane, Gibson Ramp, MD - Primary   Assistants:         Pre-op Diagnosis: Pre-Op Diagnosis Codes:     * Kidney stone [N20.0]       Post-op Diagnosis: Post-Op Diagnosis Codes:     * Kidney stone [N20.0]       Procedure(s) Performed: Procedure(s) (LRB):  RIGHT URETEROSCOPY, USING HOLMIUM LASER WITH STENT (Right)       Anesthesia Type: General        Fluid Totals: I/O this shift:  03/27 0700 - 03/27 1459  In: 1000 (12.2 mL/kg) [I.V.:1000]  Out: - (0 mL/kg)   Net: 1000  Weight: 81.6 kg        Estimated Blood Loss: * No values recorded between 03/13/2023 10:44 AM and 03/13/2023 11:26 AM *       Specimens to Pathology:  ID Type Source Tests Collected by Time Destination   1 : Right ureteral stone For Composition Analysis STONE Stone STONE  LYSIS Richard Miu, RN 03/13/2023 1125           Temporary Implants: Implant: Kit Urological Stent L26cm Dia46fr Double Pigtail Radiopaque Threaded Hydrophilic Flexible Tip With Positioner Without Guidewire Percuflex Universal Soft - [id number]. Anticipated Removal Date:03/20/2023.        Packing:                 Patient Condition: good       Findings (Including unexpected complications): Right distal ureteral stones     Signed:  Lenard Lance, MD  on 03/13/2023 at 11:26 AM

## 2023-03-13 NOTE — Anesthesia Procedure Notes (Signed)
---------------------------------------------------------------------------------------------------------------------------------------    AIRWAY   GENERAL INFORMATION AND STAFF    Patient location during procedure: OR       Date of Procedure: 03/13/2023 11:05 AM  CONDITION PRIOR TO MANIPULATION     Current Airway/Neck Condition:  Normal        For more airway physical exam details, see Anesthesia PreOp Evaluation  AIRWAY METHOD     Patient Position:  Sniffing    Preoxygenated: yes      Maintained In-Line Stability: not needed, normal c-spine condition          To see details of medications used, see MAR    Induction: IV and Routine    Mask Difficulty Assessment:  0 - not attempted    Number of Attempts at Approach:  1    Number of Other Approaches Attempted:  0  FINAL AIRWAY DETAILS    Final Airway Type:  LMA    Final LMA: Unique    LMA Size: 5  ----------------------------------------------------------------------------------------------------------------------------------------

## 2023-03-14 ENCOUNTER — Encounter: Payer: Self-pay | Admitting: Urology

## 2023-03-15 ENCOUNTER — Telehealth: Payer: Self-pay | Admitting: Urology

## 2023-03-15 NOTE — Telephone Encounter (Signed)
Spoke with daughter.  Patient has a stent in place.  I discussed the reasoning behind the blood and a stent in place.  Advised to drink at least 64-80 oz of water a day.  Patient is also on eliquis which will also increase the likelihood of bleeding - discussed with daughter as well.  They will call next week if they want to come in to have stent removed or if they want a phone call for support while removing stent at home.

## 2023-03-15 NOTE — Telephone Encounter (Addendum)
Daughter is calling stating dad had surgery on 03/13/23 and there was no blood yesterday and then this morning he is having bright red blood in catheter bad. Patient is prone to blood clots. No new pain.    Patient is looking for clarification on catheter and post op treatment.

## 2023-04-16 ENCOUNTER — Other Ambulatory Visit: Payer: Self-pay | Admitting: Urology

## 2023-04-16 DIAGNOSIS — R3989 Other symptoms and signs involving the genitourinary system: Secondary | ICD-10-CM

## 2023-04-17 ENCOUNTER — Encounter: Payer: Self-pay | Admitting: Urology

## 2023-04-17 ENCOUNTER — Ambulatory Visit: Payer: Medicare (Managed Care) | Admitting: Urology

## 2023-04-17 ENCOUNTER — Other Ambulatory Visit: Payer: Self-pay

## 2023-04-17 VITALS — Ht 71.0 in | Wt 182.0 lb

## 2023-04-17 DIAGNOSIS — N200 Calculus of kidney: Secondary | ICD-10-CM

## 2023-04-17 DIAGNOSIS — N138 Other obstructive and reflux uropathy: Secondary | ICD-10-CM

## 2023-04-17 DIAGNOSIS — R3989 Other symptoms and signs involving the genitourinary system: Secondary | ICD-10-CM

## 2023-04-17 DIAGNOSIS — N401 Enlarged prostate with lower urinary tract symptoms: Secondary | ICD-10-CM

## 2023-04-17 LAB — POCT URINALYSIS DIPSTICK
Glucose,UA POCT: 1000 mg/dL — AB
Ketones,UA POCT: NEGATIVE mg/dL
Leuk Esterase,UA POCT: NEGATIVE
Lot #: 70153302
Nitrite,UA POCT: NEGATIVE
PH,UA POCT: 5 (ref 5–8)
Specific gravity,UA POCT: 1.02 (ref 1.002–1.030)

## 2023-04-17 NOTE — Progress Notes (Signed)
[facility] Urology Follow Up Visit    Jon [name]  04/17/2023    Chief complaint : nephrolithiasis s/p R URS 03/13/23  Pt is accompanied by his daughter    HPI: Jon [name] is a 89 y.o. male who presents for follow up urinary frequency with dysuria and hematuria . Currently on finasteride. Previously on Myrbetriq which he did not feel that it helped with urinary frequency.   Scheduled for cystoscopy but imaging for hematuria evaluation showing right distal stones.    Interim history:  He is now s/p R URS 03/13/23  Presents today doing well  Denies any urinary complaints  No gross hematuria or dysuria   Stent was removed by patient one week after procedure - both curls were noted       PMH -   Past Medical History:   Diagnosis Date    Arthritis     BPH (benign prostatic hypertrophy)     CHF (congestive heart failure)     Clotting disorder     Dementia     mild    Diabetes mellitus     Paget's disease of bone     Palpitations     Protein S deficiency     On chronic anticoagulation    Sinus bradycardia     Varicella      PSH -   Past Surgical History:   Procedure Laterality Date    APPENDECTOMY      CHOLECYSTECTOMY      EYE SURGERY      GALLBLADDER SURGERY      JOINT REPLACEMENT      KNEE REPLACEMENT      PR CYSTO W/URETEROSCOPY W/LITHOTRIPSY Right 03/13/2023    Procedure: RIGHT URETEROSCOPY, USING HOLMIUM LASER WITH STENT;  Surgeon: Lenard Lance, MD;  Location: [facility] MAIN OR;  Service: Urology    SKIN GRAFT      TONSILLECTOMY       Medications -   Current Outpatient Medications   Medication Sig    OZEMPIC, 0.25 OR 0.5 MG/DOSE, pen INJECT 0.5MG  SUBCUTANEOUSLY (UNDER THE SKIN) EVERY WEEK    acetaminophen (TYLENOL) 500 MG capsule Take 1 capsule (500 mg total) by mouth as needed.    spironolactone (ALDACTONE) 25 mg tablet Take 1 tablet (25 mg total) by mouth daily.    trospium (SANCTURA) 20 mg tablet Take 1 tablet (20 mg total) by mouth 2 times daily (before meals)    finasteride (PROSCAR) 5 mg tablet TAKE  1 TABLET BY MOUTH EVERY DAY. FOR USE BY MEN ONLY    memantine (NAMENDA) 10 MG tablet TAKE 1 TABLET BY MOUTH TWO TIMES DAILY    dofetilide (TIKOSYN) 250 MCG capsule Take 1 capsule (250 mcg total) by mouth 2 times daily.    memantine (NAMENDA) 10 MG tablet Take 1 tablet (10 mg total) by mouth 2 times daily.    Cinnamon 500 MG capsule Take 500 mg by mouth    cholecalciferol (VITAMIN D-1000 MAX ST) 1,000 unit tablet Take 1 tablet (1,000 units total) by mouth daily.    metFORMIN (GLUCOPHAGE) 500 mg tablet Take 1 tablet (500 mg total) by mouth 2 times daily (with meals)    apixaban (ELIQUIS) 5 mg tablet Take 1 tablet (5 mg total) by mouth every 12 hours    rosuvastatin (CRESTOR) 10 mg tablet Take 1 tablet (10 mg total) by mouth daily (with dinner)    pioglitazone (ACTOS) 15 mg tablet Take 1 tablet (15 mg total) by mouth  daily    amoxicillin (AMOXIL) 500 mg capsule Take 1 capsule (500 mg total) by mouth 2 times daily  for Simple Infection of the Urinary Tract    generic DME Dispense 1 single point cane. Use as directed.    insulin pen needle (BD PEN NEEDLE NANO 2ND GEN) 32G X 4 MM Use 1 time per week.    blood glucose monitor (FREESTYLE LITE) kit TEST AS DIRECTED    Lancets Micro Thin 33G MISC Use as directed once daily. Dispense lancets for one touch verio    blood glucose monitor w/Device KIT Test as directed    Coenzyme Q10 200 MG capsule Take 1 capsule (200 mg total) by mouth daily.    Omega-3 Fatty Acids (FISH OIL) 1000 MG CAPS capsule Take 1 capsule (1 g total) by mouth daily.    dofetilide (TIKOSYN) 250 MCG capsule Take 1 capsule (250 mcg total) by mouth 2 times daily.    lancets (ONETOUCH ULTRASOFT) Use   2  times per day as instructed for blood glucose testing.    Blood Glucose Calibration (OT ULTRA/FASTTK CNTRL SOLN) SOLN As directed    CINNAMON PO Take by mouth    Multiple Vitamins-Minerals (MULTI VITAMIN/MINERALS PO) Take by mouth    cholecalciferol (VITAMIN D) 1000 UNIT tablet Take 2 tablets (2,000 units  total) by mouth daily.    metoprolol succinate ER (TOPROL-XL) 25 mg 24 hr tablet Take 1 tablet (25 mg total) by mouth every 12 hours    Sacubitril-Valsartan (ENTRESTO) 24-26 mg per tablet Take 1 tablet by mouth 2 times daily    empagliflozin (JARDIANCE) 10 mg tablet Take 1 tablet (10 mg total) by mouth every morning     Allergies - no change  ROS - no change    PHYSICAL EXAMINATION  Ht 1.803 m (5\' 11" )   Wt 82.6 kg (182 lb)   BMI 25.38 kg/m   GENERAL:  No acute distress, well developed, well nourished  NEUROLOGIC:  Oriented to person, place, time, and situation  PSYCHIATRIC:  Normal mood and affect  HEENT:  Normocephalic, atraumatic. Trachea midline.   RESPIRATORY: Respirations non-labored  SKIN:  Normal color      No results found for this or any previous visit (from the past 24 hour(s)).      Stone analysis 03/13/23  Calculi composed primarily of   calcium oxalate monohydrate.     02/07/2023 3:33 PM     CT ABDOMEN AND PELVIS WITHOUT CONTRAST     CLINICAL INFORMATION: Hematuria, gross/macroscopic, R31.29-Other microscopic hematuria.     COMPARISON: CT abdomen and pelvis dated 01/19/2017.[name]     PROCEDURE:  Contiguous images were obtained through the abdomen and pelvis without intravenous contrast.  Automated exposure control, adjustment of the mA and/or kV according to patient size, and/or iterative reconstruction techniques were utilized for   radiation dose optimization.     FINDINGS:     There is minimal right lung basilar atelectasis. The heart appears normal in size with no pericardial effusion.     The liver is normal in  size and configuration with regular margin. The hepatic parenchyma shows normal homogeneous density without focal space occupying lesion or intrahepatic biliary duct dilatation.     The gallbladder is surgically absent. The distal esophagus and stomach appears unremarkable.     There is mild pancreatic atrophy seen. The spleen and adrenal glands are normal in size and attenuation  characteristics.     There is a 6 mm and a  3 mm stone in the distal right ureter series 2, image 153 and 151) proximal to the right vesicoureteric junction. There is no nephrolithiasis, hydronephrosis or hydroureter in either kidney.     The small and large bowel loops are normal in caliber. There is diverticulosis of the sigmoid colon without acute diverticulitis. There is no focal or diffuse wall thickening or pericolonic fat stranding. The appendix is surgically absent.     The aorta shows a normal diameter with age appropriate atherosclerotic changes.  There is no lymphadenopathy by size criteria.     There is a 2.8 x 1.8 x 2.4 cm bladder diverticulum near the apex of the urinary bladder. The urinary bladder is otherwise normally distended and shows homogeneous density. T     here are coarse calcifications in the prostate which otherwise appears normal in size. The seminal vesicles show no definite CT abnormality.     There is no free fluid or free air. The anterior abdominal wall appears unremarkable.     There is trabecular thickening and sclerosis of L5 vertebral body and left proximal femur, similar to the prior and likely represents Paget's disease. There is multilevel degenerative changes of the visualized spine with otherwise no suspicious focal   blastic or lytic areas.     Impression:     A 6 mm and a 3 mm stone in the distal right ureter proximal to the right vesicoureteric junction without hydroureteronephrosis.     Small urinary bladder diverticulum.     Mild sigmoid diverticulosis.     Unchanged trabecular thickening and sclerosis of L5 vertebral body and left proximal femur likely represents Paget's disease.       END OF IMPRESSION       Plan of care -  1. Kidney Stones s/p URS  - Recommend renal US 3 month from procedure  - Stone analysis reviewed which showed calcium oxalate stone  - General stone prevention reviewed including increased fluid intake, low salt and protein intake and increased  citrate   - Not interested in further stone prevention     2. BPH with LUTS  - Continue finasteride     Follow up: 3 months with renal US prior     Dlynn Ranes Jesse Fall, [place]

## 2023-04-19 ENCOUNTER — Encounter: Payer: Self-pay | Admitting: Gastroenterology

## 2023-04-19 ENCOUNTER — Telehealth: Payer: Self-pay | Admitting: Primary Care

## 2023-04-19 DIAGNOSIS — G8929 Other chronic pain: Secondary | ICD-10-CM

## 2023-04-19 NOTE — Telephone Encounter (Signed)
Patient daughter is asking for a referral for the acupuncture  for the patient due to the body pain.  She will ask around to find out , if they do need a referral or not.  Please advise

## 2023-04-21 ENCOUNTER — Other Ambulatory Visit: Payer: Self-pay | Admitting: Primary Care

## 2023-04-21 DIAGNOSIS — I480 Paroxysmal atrial fibrillation: Secondary | ICD-10-CM

## 2023-04-22 NOTE — Addendum Note (Signed)
Addended byAlex Gardener on: 04/22/2023 09:13 AM     Modules accepted: Orders

## 2023-04-22 NOTE — Telephone Encounter (Signed)
Referral to [facility]uncture sent.     If patient would like to see community acupuncturist, generally a referral is not needed.     Rica Mast, MD, MPH  [facility]

## 2023-04-22 NOTE — Telephone Encounter (Signed)
Left a message for [facility]

## 2023-04-22 NOTE — Telephone Encounter (Signed)
[facility] Refill Note  04/22/23  Jon [name]  [birth year]  MRN: [id number]    Last office visit: 02/12/2023    Last telehome visit: 07/23/2022    Recent Visits  Date Type Provider Dept   02/12/23 Office Visit Tillie Rung, MD [facility]   01/14/23 Office Visit Tillie Rung, MD [facility]   Showing recent visits within past 185 days in an active department and meeting all other requirements  Future Appointments  No visits were found meeting these conditions.  Showing future appointments within next 0 days in an active department and meeting all other requirements      LABORATORY DATA:  Last Renal Func was  02/08/2023: Creatinine 1.40 mg/dL (H; Ref range: 0.45 - 4.09 mg/dL); eGFR BY CREAT 49 * (!; Ref range: *); Potassium 4.9 mmol/L (Ref range: 3.3 - 5.1 mmol/L).   Last LFTs was  11/29/2021: ALT 18 U/L (Ref range: 0 - 50 U/L); AST 20 U/L (Ref range: 0 - 50 U/L).   Last Lipid panel was 11/29/2021: Cholesterol 109 mg/dL (Ref range: mg/dL); HDL 35 mg/dL (L; Ref range: 40 - 60 mg/dL); LDL Calculated 43 mg/dL (Ref range: mg/dL).  Last A1C was 02/08/2023: Hemoglobin A1C 6.7 % (H; Ref range: %).  Last TFT was 02/08/2023: TSH 5.65 uIU/mL (H; Ref range: 0.27 - 4.20 uIU/mL), 11/29/2021: Free T4 1.1 ng/dL (Ref range: 0.9 - 1.7 ng/dL).    Steele Sizer, RN

## 2023-04-22 NOTE — Addendum Note (Signed)
Addended by: Rica Mast R on: 04/22/2023 10:02 AM     Modules accepted: Orders

## 2023-04-26 ENCOUNTER — Ambulatory Visit: Payer: Medicare (Managed Care) | Admitting: Urology

## 2023-05-17 ENCOUNTER — Other Ambulatory Visit: Payer: Self-pay | Admitting: Primary Care

## 2023-05-17 DIAGNOSIS — E1169 Type 2 diabetes mellitus with other specified complication: Secondary | ICD-10-CM

## 2023-05-17 NOTE — Telephone Encounter (Signed)
[facility] Refill Note  05/17/23  Jon [name]  08-14-1934  MRN: [id number]    Last office visit: 02/12/2023    Last telehome visit: 07/23/2022    Recent Visits  Date Type Provider Dept   02/12/23 Office Visit Tillie Rung, MD [facility]   01/14/23 Office Visit Tillie Rung, MD [facility]   Showing recent visits within past 185 days in an active department and meeting all other requirements  Future Appointments  No visits were found meeting these conditions.  Showing future appointments within next 0 days in an active department and meeting all other requirements      LABORATORY DATA:  Last Renal Func was  02/08/2023: Creatinine 1.40 mg/dL (H; Ref range: 0.45 - 4.09 mg/dL); eGFR BY CREAT 49 * (!; Ref range: *); Potassium 4.9 mmol/L (Ref range: 3.3 - 5.1 mmol/L).   Last LFTs was  11/29/2021: ALT 18 U/L (Ref range: 0 - 50 U/L); AST 20 U/L (Ref range: 0 - 50 U/L).   Last Lipid panel was 11/29/2021: Cholesterol 109 mg/dL (Ref range: mg/dL); HDL 35 mg/dL (L; Ref range: 40 - 60 mg/dL); LDL Calculated 43 mg/dL (Ref range: mg/dL).  Last A1C was 02/08/2023: Hemoglobin A1C 6.7 % (H; Ref range: %).  Last TFT was 02/08/2023: TSH 5.65 uIU/mL (H; Ref range: 0.27 - 4.20 uIU/mL), 11/29/2021: Free T4 1.1 ng/dL (Ref range: 0.9 - 1.7 ng/dL).    Suzzanne Cloud, LPN

## 2023-05-20 ENCOUNTER — Other Ambulatory Visit: Payer: Self-pay | Admitting: Urology

## 2023-05-20 NOTE — Telephone Encounter (Signed)
Prescription was last filled Trospium 20 mg (#60 3 refills)January 25 2023    Patient was last seen 04/17/2023    Plan from last visit: Plan of care -  BPH with LUTS  - Continue finasteride   - Would not be able to tolerate alpha blockers as patient has baseline hypotension   - Patient is not surgical candidate given age and comorbidity     2. Urinary frequency   - PVR minimal at 80mL  - Recommend trial trospium once at night   - Discussed avoiding bladder irritants   - Discussed with patient that some medications may be causing urinary frequency        Next appointment date: 06/14/2023    Please review and sign if appropriate.

## 2023-05-29 ENCOUNTER — Other Ambulatory Visit: Payer: Self-pay

## 2023-05-29 ENCOUNTER — Ambulatory Visit
Admission: RE | Admit: 2023-05-29 | Discharge: 2023-05-29 | Disposition: A | Discharge status: Home / Self Care | Payer: Medicare (Managed Care) | Source: Ambulatory Visit | Attending: Urology | Admitting: Urology

## 2023-05-29 DIAGNOSIS — N200 Calculus of kidney: Secondary | ICD-10-CM | POA: Insufficient documentation

## 2023-05-29 DIAGNOSIS — N281 Cyst of kidney, acquired: Secondary | ICD-10-CM

## 2023-05-31 ENCOUNTER — Other Ambulatory Visit: Payer: Medicare (Managed Care)

## 2023-06-04 ENCOUNTER — Other Ambulatory Visit: Payer: Self-pay | Admitting: Urology

## 2023-06-04 DIAGNOSIS — R3989 Other symptoms and signs involving the genitourinary system: Secondary | ICD-10-CM

## 2023-06-06 ENCOUNTER — Ambulatory Visit: Payer: Medicare (Managed Care) | Attending: Urology | Admitting: Urology

## 2023-06-06 ENCOUNTER — Encounter: Payer: Self-pay | Admitting: Urology

## 2023-06-06 VITALS — Ht 71.0 in | Wt 182.0 lb

## 2023-06-06 DIAGNOSIS — N401 Enlarged prostate with lower urinary tract symptoms: Secondary | ICD-10-CM

## 2023-06-06 DIAGNOSIS — N200 Calculus of kidney: Secondary | ICD-10-CM

## 2023-06-06 DIAGNOSIS — N138 Other obstructive and reflux uropathy: Secondary | ICD-10-CM

## 2023-06-06 DIAGNOSIS — R3989 Other symptoms and signs involving the genitourinary system: Secondary | ICD-10-CM

## 2023-06-06 LAB — POCT URINALYSIS DIPSTICK
Glucose,UA POCT: 1000 mg/dL — AB
Ketones,UA POCT: NEGATIVE mg/dL
Lot #: 73386502
Nitrite,UA POCT: NEGATIVE
PH,UA POCT: 5 (ref 5–8)
Specific gravity,UA POCT: 1.015 (ref 1.002–1.030)

## 2023-06-06 NOTE — Patient Instructions (Signed)
Stone Prevention Guide     Drink 2.5-3 (3/4 gallon) liters of water per day     Add lemon/lime/crystal light lemonade to your water     Low Sodium Diet  A main source of sodium is table salt. The average American eats five or more teaspoons of salt each day. This is about 20 times as much as the body needs. In fact, your body needs only 1/4 teaspoon of salt every day. Sodium is found naturally in foods, but a lot of it is added during processing and preparation. Many foods that do not taste salty may still be high in sodium. Large amounts of sodium can be hidden in canned, processed and convenience foods. And sodium can be found in many foods that are served at fast food restaurants.  Sodium controls fluid balance in our bodies and maintains blood volume and blood pressure. Eating too much sodium may raise blood pressure and cause fluid retention, which could lead to swelling of the legs and feet or other health issues.  When limiting sodium in your diet, a common target is to eat less than 2,000 milligrams of sodium per day.  General Guidelines for Cutting Down on Salt  · Eliminate salty foods from your diet and reduce the amount of salt used in cooking. Sea salt is no better than regular salt.   · Choose low sodium foods. Many salt-free or reduced salt products are available. When reading food labels, low sodium is defined as 140 mg of sodium per serving.   · Salt substitutes are sometimes made from potassium, so read the label. If you are on a low potassium diet, then check with your doctor before using those salt substitutes.   · Be creative and season your foods with spices, herbs, lemon, garlic, ginger, vinegar and pepper. Remove the salt shaker from the table.   · Read ingredient labels to identify foods high in sodium. Items with 400 mg or more of sodium are high in sodium. High sodium food additives include salt, brine, or other items that say sodium, such as monosodium glutamate.   · Eat more home-cooked  meals. Foods cooked from scratch are naturally lower in sodium than most instant and boxed mixes.   · Don't use softened water for cooking and drinking since it contains added salt.   · Avoid medications which contain sodium such as Alka Seltzer and Bromo Seltzer.   · For more information; food composition books are available which tell how much sodium is in food. Online sources such as www.calorieking.com also list amounts.   Meats, Poultry, Fish, Legumes, Eggs and Nuts  High-Sodium Foods:  · Smoked, cured, salted or canned meat, fish or poultry including bacon, cold cuts, ham, frankfurters, sausage, sardines, caviar and anchovies   · Frozen breaded meats and dinners, such as burritos and pizza   · Canned entrees, such as ravioli, spam and chili   · Salted nuts   · Beans canned with salt added  Low-Sodium Alternatives:  · Any fresh or frozen beef, lamb, pork, poultry and fish   · Eggs and egg substitutes   · Low-sodium peanut butter   · Dry peas and beans (not canned)   · Low-sodium canned fish   · Drained, water or oil packed canned fish or poultry  Dairy Products  High-Sodium Foods:  · Buttermilk   · Regular and processed cheese, cheese spreads and sauces   · Cottage cheese  Low-Sodium Alternatives:  · Milk, yogurt, ice cream   and ice milk   · Low-sodium cheeses, cream cheese, ricotta cheese and mozzarella  Breads, Grains and Cereals  High-Sodium Foods:  · Bread and rolls with salted tops   · Quick breads, self-rising flour, biscuit, pancake and waffle mixes   · Pizza, croutons and salted crackers   · Prepackaged, processed mixes for potatoes, rice, pasta and stuffing  Low-Sodium Alternatives:  · Breads, bagels and rolls without salted tops   · Muffins and most ready-to-eat cereals   · All rice and pasta, but do not to add salt when cooking   · Low-sodium corn and flour tortillas and noodles   · Low-sodium crackers and breadsticks   · Unsalted popcorn, chips and pretzels  Vegetables and Fruits  High-Sodium  Foods:  · Regular canned vegetables and vegetable juices   · Olives, pickles, sauerkraut and other pickled vegetables   · Vegetables made with ham, bacon or salted pork   · Packaged mixes, such as scalloped or au gratin potatoes, frozen hash browns and Tater Tots   · Commercially prepared pasta and tomato sauces and salsa  Low-Sodium Alternatives:  · Fresh and frozen vegetables without sauces   · Low-sodium canned vegetables, sauces and juices   · Fresh potatoes, frozen French fries and instant mashed potatoes   · Low-salt tomato or V-8 juice.   · Most fresh, frozen and canned fruit   · Dried fruits  Soups  High-Sodium Foods:  · Regular canned and dehydrated soup, broth and bouillon   · Cup of noodles and seasoned ramen mixes  Low-Sodium Alternatives:  · Low-sodium canned and dehydrated soups, broth and bouillon   · Homemade soups without added salt  Fats, Desserts and Sweets  High-Sodium Foods:  · Soy sauce, seasoning salt, other sauces and marinades   · Bottled salad dressings, regular salad dressing with bacon bits   · Salted butter or margarine   · Instant pudding and cake   · Large portions of ketchup, mustard  Low-Sodium Alternatives:  · Vinegar, unsalted butter or margarine   · Vegetable oils and low sodium sauces and salad dressings   · Mayonnaise   · All desserts made without salt

## 2023-06-06 NOTE — Progress Notes (Signed)
[facility] Follow Up Visit    KOFI PRINKEY  06/06/2023    Chief complaint : nephrolithiasis s/p R URS 03/13/23  Pt is accompanied by his daughter    HPI: Jon [name] is a 89 y.o. male who presents for follow up urinary frequency with dysuria and hematuria . Currently on finasteride. Previously on Myrbetriq which he did not feel that it helped with urinary frequency.   Scheduled for cystoscopy but imaging for hematuria evaluation showing right distal stones.    Interim history:  He is now s/p R URS 03/13/23  Presents today doing well  No stone events   Denies any urinary complaints  No gross hematuria or dysuria       PMH -   Past Medical History:   Diagnosis Date    Arthritis     BPH (benign prostatic hypertrophy)     CHF (congestive heart failure)     Clotting disorder     Dementia     mild    Diabetes mellitus     Paget's disease of bone     Palpitations     Protein S deficiency     On chronic anticoagulation    Sinus bradycardia     Varicella      PSH -   Past Surgical History:   Procedure Laterality Date    APPENDECTOMY      CHOLECYSTECTOMY      EYE SURGERY      GALLBLADDER SURGERY      JOINT REPLACEMENT      KNEE REPLACEMENT      PR CYSTO W/URETEROSCOPY W/LITHOTRIPSY Right 03/13/2023    Procedure: RIGHT URETEROSCOPY, USING HOLMIUM LASER WITH STENT;  Surgeon: Lenard Lance, MD;  Location: [facility] MAIN OR;  Service: Urology    SKIN GRAFT      TONSILLECTOMY       Medications -   Current Outpatient Medications   Medication Sig    trospium (SANCTURA) 20 mg tablet TAKE 1 TABLET BY MOUTH TWO TIMES DAILY BEFORE MEALS    OZEMPIC, 0.25 OR 0.5 MG/DOSE, pen INJECT 0.5MG  SUBCUTANEOUSLY (UNDER THE SKIN) EVERY WEEK    ELIQUIS 5 MG tablet TAKE 1 TABLET BY MOUTH EVERY 12 HOURS    spironolactone (ALDACTONE) 25 mg tablet Take 1 tablet (25 mg total) by mouth daily.    finasteride (PROSCAR) 5 mg tablet TAKE 1 TABLET BY MOUTH EVERY DAY. FOR USE BY MEN ONLY    dofetilide (TIKOSYN) 250 MCG capsule Take 1 capsule  (250 mcg total) by mouth 2 times daily.    memantine (NAMENDA) 10 MG tablet Take 1 tablet (10 mg total) by mouth 2 times daily.    Cinnamon 500 MG capsule Take 500 mg by mouth    cholecalciferol (VITAMIN D-1000 MAX ST) 1,000 unit tablet Take 1 tablet (1,000 units total) by mouth daily.    metoprolol succinate ER (TOPROL-XL) 25 mg 24 hr tablet Take 1 tablet (25 mg total) by mouth every 12 hours    metFORMIN (GLUCOPHAGE) 500 mg tablet Take 1 tablet (500 mg total) by mouth 2 times daily (with meals)    rosuvastatin (CRESTOR) 10 mg tablet Take 1 tablet (10 mg total) by mouth daily (with dinner)    pioglitazone (ACTOS) 15 mg tablet Take 1 tablet (15 mg total) by mouth daily    generic DME Dispense 1 single point cane. Use as directed.    insulin pen needle (BD PEN NEEDLE NANO 2ND GEN) 32G X 4  MM Use 1 time per week.    blood glucose monitor (FREESTYLE LITE) kit TEST AS DIRECTED    Lancets Micro Thin 33G MISC Use as directed once daily. Dispense lancets for one touch verio    blood glucose monitor w/Device KIT Test as directed    Coenzyme Q10 200 MG capsule Take 1 capsule (200 mg total) by mouth daily.    Omega-3 Fatty Acids (FISH OIL) 1000 MG CAPS capsule Take 1 capsule (1 g total) by mouth daily.    lancets (ONETOUCH ULTRASOFT) Use   2  times per day as instructed for blood glucose testing.    Blood Glucose Calibration (OT ULTRA/FASTTK CNTRL SOLN) SOLN As directed    Multiple Vitamins-Minerals (MULTI VITAMIN/MINERALS PO) Take by mouth    acetaminophen (TYLENOL) 500 MG capsule Take 1 capsule (500 mg total) by mouth as needed.    memantine (NAMENDA) 10 MG tablet TAKE 1 TABLET BY MOUTH TWO TIMES DAILY    Sacubitril-Valsartan (ENTRESTO) 24-26 mg per tablet Take 1 tablet by mouth 2 times daily    empagliflozin (JARDIANCE) 10 mg tablet Take 1 tablet (10 mg total) by mouth every morning    amoxicillin (AMOXIL) 500 mg capsule Take 1 capsule (500 mg total) by mouth 2 times daily  for Simple Infection of the Urinary Tract     dofetilide (TIKOSYN) 250 MCG capsule Take 1 capsule (250 mcg total) by mouth 2 times daily. (Patient not taking: Reported on 06/06/2023)    CINNAMON PO Take by mouth     Allergies - no change  ROS - no change    PHYSICAL EXAMINATION  Ht 1.803 m (5\' 11" )   Wt 82.6 kg (182 lb)   BMI 25.38 kg/m   GENERAL:  No acute distress, well developed, well nourished  NEUROLOGIC:  Oriented to person, place, time, and situation  PSYCHIATRIC:  Normal mood and affect  HEENT:  Normocephalic, atraumatic. Trachea midline.   RESPIRATORY: Respirations non-labored  SKIN:  Normal color      Recent Results (from the past 24 hour(s))   POCT urinalysis dipstick    Collection Time: 06/06/23  1:08 PM   Result Value Ref Range    Specific gravity,UA POCT 1.015 1.002 - 1.030    PH,UA POCT 5.0 5 - 8    Leuk Esterase,UA POCT Trace (!) Negative    Nitrite,UA POCT Negative Negative    Protein,UA POCT Trace (!) Negative mg/dL    Glucose,UA POCT 1000 mg/dL (!) Normal mg/dL    Ketones,UA POCT Negative Negative mg/dL    Urobilinogen,UA Test Not Performed Less than 1 mg/dL    Bilirubin,Ur Test Not Performed Negative, Test Not Performed    Blood,UA POCT About 250 (!) Negative    Exp date 12.31.24     Lot # [id number]          Stone analysis 03/13/23  Calculi composed primarily of   calcium oxalate monohydrate.     02/07/2023 3:33 PM     CT ABDOMEN AND PELVIS WITHOUT CONTRAST     CLINICAL INFORMATION: Hematuria, gross/macroscopic, R31.29-Other microscopic hematuria.     COMPARISON: CT abdomen and pelvis dated 01/19/2017.[name]     PROCEDURE:  Contiguous images were obtained through the abdomen and pelvis without intravenous contrast.  Automated exposure control, adjustment of the mA and/or kV according to patient size, and/or iterative reconstruction techniques were utilized for   radiation dose optimization.     FINDINGS:     There is minimal right lung basilar atelectasis.  The heart appears normal in size with no pericardial effusion.     The liver is normal in   size and configuration with regular margin. The hepatic parenchyma shows normal homogeneous density without focal space occupying lesion or intrahepatic biliary duct dilatation.     The gallbladder is surgically absent. The distal esophagus and stomach appears unremarkable.     There is mild pancreatic atrophy seen. The spleen and adrenal glands are normal in size and attenuation characteristics.     There is a 6 mm and a 3 mm stone in the distal right ureter series 2, image 153 and 151) proximal to the right vesicoureteric junction. There is no nephrolithiasis, hydronephrosis or hydroureter in either kidney.     The small and large bowel loops are normal in caliber. There is diverticulosis of the sigmoid colon without acute diverticulitis. There is no focal or diffuse wall thickening or pericolonic fat stranding. The appendix is surgically absent.     The aorta shows a normal diameter with age appropriate atherosclerotic changes.  There is no lymphadenopathy by size criteria.     There is a 2.8 x 1.8 x 2.4 cm bladder diverticulum near the apex of the urinary bladder. The urinary bladder is otherwise normally distended and shows homogeneous density. T     here are coarse calcifications in the prostate which otherwise appears normal in size. The seminal vesicles show no definite CT abnormality.     There is no free fluid or free air. The anterior abdominal wall appears unremarkable.     There is trabecular thickening and sclerosis of L5 vertebral body and left proximal femur, similar to the prior and likely represents Paget's disease. There is multilevel degenerative changes of the visualized spine with otherwise no suspicious focal   blastic or lytic areas.     Impression:     A 6 mm and a 3 mm stone in the distal right ureter proximal to the right vesicoureteric junction without hydroureteronephrosis.     Small urinary bladder diverticulum.     Mild sigmoid diverticulosis.     Unchanged trabecular thickening and  sclerosis of L5 vertebral body and left proximal femur likely represents Paget's disease.       END OF IMPRESSION       Renal US 05/29/23     No hydronephrosis or stones bilaterally. Simple 1 cm right renal cyst similar to prior CT.         END OF IMPRESSION       Plan of care -  1. Kidney Stones s/p URS  - Renal US reviewed which reveals no stones   - General stone prevention reviewed including increased fluid intake, low salt and protein intake and increased citrate   - Not interested in further stone prevention     2. BPH with LUTS  - Continue finasteride - Rx prescribed by PCP    Follow up: as needed - patient requested     Denajah Farias Jesse Fall, PA

## 2023-06-14 ENCOUNTER — Ambulatory Visit: Payer: Medicare (Managed Care) | Admitting: Urology

## 2023-07-01 ENCOUNTER — Other Ambulatory Visit: Payer: Self-pay | Admitting: Primary Care

## 2023-07-01 DIAGNOSIS — E1169 Type 2 diabetes mellitus with other specified complication: Secondary | ICD-10-CM

## 2023-07-01 NOTE — Telephone Encounter (Signed)
[facility] Refill Note  07/01/23  MARCIAL PLESS  1934-02-16  MRN: [id number]    Last office visit: 02/12/2023    Last telehome visit: 07/23/2022    Recent Visits  Date Type Provider Dept   02/12/23 Office Visit Tillie Rung, MD [facility]   01/14/23 Office Visit Tillie Rung, MD [facility]   Showing recent visits within past 185 days in an active department and meeting all other requirements  Future Appointments  No visits were found meeting these conditions.  Showing future appointments within next 0 days in an active department and meeting all other requirements      LABORATORY DATA:  Last Renal Func was  02/08/2023: Creatinine 1.40 mg/dL (H; Ref range: 5.73 - 2.20 mg/dL); eGFR BY CREAT 49 * (!; Ref range: *); Potassium 4.9 mmol/L (Ref range: 3.3 - 5.1 mmol/L).   Last LFTs was  11/29/2021: ALT 18 U/L (Ref range: 0 - 50 U/L); AST 20 U/L (Ref range: 0 - 50 U/L).   Last Lipid panel was 11/29/2021: Cholesterol 109 mg/dL (Ref range: mg/dL); HDL 35 mg/dL (L; Ref range: 40 - 60 mg/dL); LDL Calculated 43 mg/dL (Ref range: mg/dL).  Last A1C was 02/08/2023: Hemoglobin A1C 6.7 % (H; Ref range: %).  Last TFT was 02/08/2023: TSH 5.65 uIU/mL (H; Ref range: 0.27 - 4.20 uIU/mL), 11/29/2021: Free T4 1.1 ng/dL (Ref range: 0.9 - 1.7 ng/dL).    Breawna Montenegro Lysbeth Penner

## 2023-08-06 ENCOUNTER — Other Ambulatory Visit: Payer: Self-pay | Admitting: Primary Care

## 2023-08-06 DIAGNOSIS — E7800 Pure hypercholesterolemia, unspecified: Secondary | ICD-10-CM

## 2023-08-06 NOTE — Telephone Encounter (Signed)
[facility] Refill Note  08/06/23  Jon [name]  19-Jul-1934  MRN: [id number]    Last office visit: 02/12/2023    Last telehome visit: Visit date not found    Recent Visits  Date Type Provider Dept   02/12/23 Office Visit Tillie Rung, MD [facility]   Showing recent visits within past 185 days in an active department and meeting all other requirements  Future Appointments  No visits were found meeting these conditions.  Showing future appointments within next 0 days in an active department and meeting all other requirements      LABORATORY DATA:  Last Renal Func was  02/08/2023: Creatinine 1.40 mg/dL (H; Ref range: 1.19 - 1.47 mg/dL); eGFR BY CREAT 49 * (!; Ref range: *); Potassium 4.9 mmol/L (Ref range: 3.3 - 5.1 mmol/L).   Last LFTs was  11/29/2021: ALT 18 U/L (Ref range: 0 - 50 U/L); AST 20 U/L (Ref range: 0 - 50 U/L).   Last Lipid panel was 11/29/2021: Cholesterol 109 mg/dL (Ref range: mg/dL); HDL 35 mg/dL (L; Ref range: 40 - 60 mg/dL); LDL Calculated 43 mg/dL (Ref range: mg/dL).  Last A1C was 02/08/2023: Hemoglobin A1C 6.7 % (H; Ref range: %).  Last TFT was 02/08/2023: TSH 5.65 uIU/mL (H; Ref range: 0.27 - 4.20 uIU/mL), 11/29/2021: Free T4 1.1 ng/dL (Ref range: 0.9 - 1.7 ng/dL).    Arminda Resides, LPN

## 2023-08-18 ENCOUNTER — Other Ambulatory Visit: Payer: Self-pay | Admitting: Primary Care

## 2023-08-18 DIAGNOSIS — E1169 Type 2 diabetes mellitus with other specified complication: Secondary | ICD-10-CM

## 2023-08-20 NOTE — Telephone Encounter (Signed)
[facility] Refill Note  08/20/23  Jon [name]  1934-04-08  MRN: [id number]    Last office visit: 02/12/2023    Last telehome visit: Visit date not found    Recent Visits  No visits were found meeting these conditions.  Showing recent visits within past 185 days in an active department and meeting all other requirements  Future Appointments  No visits were found meeting these conditions.  Showing future appointments within next 0 days in an active department and meeting all other requirements      LABORATORY DATA:  Last Renal Func was  02/08/2023: Creatinine 1.40 mg/dL (H; Ref range: 2.95 - 1.88 mg/dL); eGFR BY CREAT 49 * (!; Ref range: *); Potassium 4.9 mmol/L (Ref range: 3.3 - 5.1 mmol/L).   Last LFTs was  11/29/2021: ALT 18 U/L (Ref range: 0 - 50 U/L); AST 20 U/L (Ref range: 0 - 50 U/L).   Last Lipid panel was 11/29/2021: Cholesterol 109 mg/dL (Ref range: mg/dL); HDL 35 mg/dL (L; Ref range: 40 - 60 mg/dL); LDL Calculated 43 mg/dL (Ref range: mg/dL).  Last A1C was 02/08/2023: Hemoglobin A1C 6.7 % (H; Ref range: %).  Last TFT was 02/08/2023: TSH 5.65 uIU/mL (H; Ref range: 0.27 - 4.20 uIU/mL), 11/29/2021: Free T4 1.1 ng/dL (Ref range: 0.9 - 1.7 ng/dL).    [name]

## 2023-08-28 ENCOUNTER — Other Ambulatory Visit: Payer: Self-pay | Admitting: Primary Care

## 2023-08-28 DIAGNOSIS — E1169 Type 2 diabetes mellitus with other specified complication: Secondary | ICD-10-CM

## 2023-08-28 NOTE — Telephone Encounter (Signed)
[facility] Refill Note  08/28/23  KALIJAH MCCARVILLE  1934-09-01  MRN: [id number]    Last office visit: 02/12/2023    Last telehome visit: Visit date not found    Recent Visits  No visits were found meeting these conditions.  Showing recent visits within past 185 days in an active department and meeting all other requirements  Future Appointments  No visits were found meeting these conditions.  Showing future appointments within next 0 days in an active department and meeting all other requirements      LABORATORY DATA:  Last Renal Func was  02/08/2023: Creatinine 1.40 mg/dL (H; Ref range: 0.98 - 1.19 mg/dL); eGFR BY CREAT 49 * (!; Ref range: *); Potassium 4.9 mmol/L (Ref range: 3.3 - 5.1 mmol/L).   Last LFTs was  11/29/2021: ALT 18 U/L (Ref range: 0 - 50 U/L); AST 20 U/L (Ref range: 0 - 50 U/L).   Last Lipid panel was 11/29/2021: Cholesterol 109 mg/dL (Ref range: mg/dL); HDL 35 mg/dL (L; Ref range: 40 - 60 mg/dL); LDL Calculated 43 mg/dL (Ref range: mg/dL).  Last A1C was 02/08/2023: Hemoglobin A1C 6.7 % (H; Ref range: %).  Last TFT was 02/08/2023: TSH 5.65 uIU/mL (H; Ref range: 0.27 - 4.20 uIU/mL), 11/29/2021: Free T4 1.1 ng/dL (Ref range: 0.9 - 1.7 ng/dL).    Arminda Resides, LPN

## 2023-09-05 ENCOUNTER — Telehealth: Payer: Self-pay

## 2023-09-05 ENCOUNTER — Other Ambulatory Visit
Admission: RE | Admit: 2023-09-05 | Discharge: 2023-09-05 | Disposition: A | Discharge status: Home / Self Care | Payer: Medicare (Managed Care) | Source: Ambulatory Visit | Attending: Urology | Admitting: Urology

## 2023-09-05 DIAGNOSIS — R3989 Other symptoms and signs involving the genitourinary system: Secondary | ICD-10-CM | POA: Insufficient documentation

## 2023-09-05 LAB — URINALYSIS WITH REFLEX TO MICROSCOPIC
Blood,UA: NEGATIVE
Ketones, UA: NEGATIVE
Nitrite,UA: NEGATIVE
Protein,UA: NEGATIVE
Specific Gravity,UA: 1.024 (ref 1.002–1.030)
pH,UA: 6 (ref 5.0–8.0)

## 2023-09-05 LAB — URINE MICROSCOPIC (IQ200)
Bacteria,UA: NONE SEEN
Hyaline Casts,UA: NONE SEEN /lpf (ref 0–5)
WBC,UA: >50 /hpf — AB (ref 0–5)

## 2023-09-05 NOTE — Telephone Encounter (Signed)
Copied from CRM [id number]. Topic: Access to Care - Labs/Orders/Imaging  >> Sep 05, 2023 11:13 AM Macarthur Critchley wrote:      Aurea Kajol Crispen - spouse  is calling to request an order be submitted for urinalysis.     Aurea Robby Pirani - spouse states that patient may have UTI and would like to submit urine sample    The order can be submitted to [facility] labs.  Lab phone:  Lab fax:    Aurea Sidney Kann - spouse can be reached with questions at [phone]

## 2023-09-05 NOTE — Telephone Encounter (Signed)
Writer called and spoke with patient's spouse Aurea Graff and she stated patient is having frequency, and urgency. Denies burning with urination, fever/chills, hematuria, vomiting, or issues with constipation.   Patient drinking 24-32oz/day. Advised patient to increase fluids to at least 80 oz per day. Orders placed for urine sample. Advised of time frame for urine results.

## 2023-09-06 LAB — AEROBIC CULTURE

## 2023-09-09 ENCOUNTER — Telehealth: Payer: Self-pay | Admitting: Urology

## 2023-09-09 MED ORDER — LEVOFLOXACIN 500 MG PO TABS *I*
500.0000 mg | ORAL_TABLET | Freq: Every day | ORAL | 0 refills | Status: AC
Start: 2023-09-09 — End: 2023-09-16

## 2023-09-09 NOTE — Telephone Encounter (Signed)
Levaquin sent in

## 2023-09-09 NOTE — Telephone Encounter (Signed)
Copied from CRM #[id number]. Topic: Access to Care - Labs/Orders/Imaging  >> Sep 09, 2023 11:24 AM Sondra Come wrote:          Jon [name] - Spouse is requesting a return call to review Jon [name]'s recent test results.    What type of test was done? UA    Where was the test done? [facility]     On what day was the test done? 09/05/2023    Please return the patients call at [phone];.

## 2023-09-09 NOTE — Telephone Encounter (Signed)
Spoke with patient and let him know about antibiotic.

## 2023-09-09 NOTE — Telephone Encounter (Signed)
Patients wife calling about results to recent urine culture. Patient is experiencing frequency and urgency.

## 2023-09-09 NOTE — Telephone Encounter (Signed)
Copied from CRM [id number]. Topic: Medications/Prescriptions - Medication Question/Problem  >> Sep 09, 2023  3:51 PM Leavy Cella R wrote:  Jonnie Finner [facility] - called in regards to the levoFLOXacin (LEVAQUIN) 500 mg tablet interacting with the dofetilide (TIKOSYN) 250 MCG capsule - causing the worsening of heart arrhthymias.     Jonnie Finner can be reached at: [phone]   Jonnie Finner advised calling the direct line of- [phone]

## 2023-09-09 NOTE — Telephone Encounter (Signed)
Would you like to order something else due to interaction with Tikosyn per pharmacy can worsen heart arrhythmias.

## 2023-09-10 ENCOUNTER — Other Ambulatory Visit: Payer: Self-pay | Admitting: Urology

## 2023-09-10 MED ORDER — CEPHALEXIN 500 MG PO CAPS *I*
500.0000 mg | ORAL_CAPSULE | Freq: Three times a day (TID) | ORAL | 0 refills | Status: AC
Start: 2023-09-10 — End: 2023-09-17

## 2023-09-10 NOTE — Telephone Encounter (Signed)
Spoke to patient to instruct him Keflex was sent into [facility] pharmacy. He has already picked it up.

## 2023-09-10 NOTE — Telephone Encounter (Signed)
Copied from CRM [id number]. Topic: Medications/Prescriptions - Medication Question/Problem  >> Sep 10, 2023  9:31 AM Judeth Cornfield D wrote:  Jonnie Finner [facility] - called in regards to the levoFLOXacin (LEVAQUIN) 500 mg tablet interacting with the dofetilide (TIKOSYN) 250 MCG capsule - causing the worsening of heart arrhthymias.      Jonnie Finner can be reached at: [phone]   Jonnie Finner advised calling the direct line of- [phone]

## 2023-09-10 NOTE — Telephone Encounter (Signed)
Keflex sent in as an alternative

## 2023-09-12 ENCOUNTER — Other Ambulatory Visit: Payer: Self-pay | Admitting: Primary Care

## 2023-09-12 DIAGNOSIS — E1169 Type 2 diabetes mellitus with other specified complication: Secondary | ICD-10-CM

## 2023-09-12 NOTE — Telephone Encounter (Signed)
[facility] Refill Note  09/12/23  MONTERO MONCE  1934-12-10  MRN: [id number]    Last office visit: 02/12/2023    Last telehome visit: Visit date not found    Recent Visits  No visits were found meeting these conditions.  Showing recent visits within past 185 days in an active department and meeting all other requirements  Future Appointments  No visits were found meeting these conditions.  Showing future appointments within next 0 days in an active department and meeting all other requirements      LABORATORY DATA:  Last Renal Func was  02/08/2023: Creatinine 1.40 mg/dL (H; Ref range: 0.45 - 4.09 mg/dL); eGFR BY CREAT 49 * (!; Ref range: *); Potassium 4.9 mmol/L (Ref range: 3.3 - 5.1 mmol/L).   Last LFTs was  11/29/2021: ALT 18 U/L (Ref range: 0 - 50 U/L); AST 20 U/L (Ref range: 0 - 50 U/L).   Last Lipid panel was 11/29/2021: Cholesterol 109 mg/dL (Ref range: mg/dL); HDL 35 mg/dL (L; Ref range: 40 - 60 mg/dL); LDL Calculated 43 mg/dL (Ref range: mg/dL).  Last A1C was 02/08/2023: Hemoglobin A1C 6.7 % (H; Ref range: %).  Last TFT was 02/08/2023: TSH 5.65 uIU/mL (H; Ref range: 0.27 - 4.20 uIU/mL), 11/29/2021: Free T4 1.1 ng/dL (Ref range: 0.9 - 1.7 ng/dL).    Arminda Resides, LPN

## 2023-10-09 ENCOUNTER — Other Ambulatory Visit: Payer: Self-pay | Admitting: Primary Care

## 2023-10-09 DIAGNOSIS — E1169 Type 2 diabetes mellitus with other specified complication: Secondary | ICD-10-CM

## 2023-10-09 NOTE — Telephone Encounter (Signed)
[facility] Refill Note  10/09/23  AVERELL BRINTNALL  1934/07/10  MRN: [id number]    Last office visit: 02/12/2023    Last telehome visit: Visit date not found    Recent Visits  No visits were found meeting these conditions.  Showing recent visits within past 185 days in an active department and meeting all other requirements  Future Appointments  No visits were found meeting these conditions.  Showing future appointments within next 0 days in an active department and meeting all other requirements      LABORATORY DATA:  Last Renal Func was  02/08/2023: Creatinine 1.40 mg/dL (H; Ref range: 9.14 - 7.82 mg/dL); eGFR BY CREAT 49 * (!; Ref range: *); Potassium 4.9 mmol/L (Ref range: 3.3 - 5.1 mmol/L).   Last LFTs was  11/29/2021: ALT 18 U/L (Ref range: 0 - 50 U/L); AST 20 U/L (Ref range: 0 - 50 U/L).   Last Lipid panel was 11/29/2021: Cholesterol 109 mg/dL (Ref range: mg/dL); HDL 35 mg/dL (L; Ref range: 40 - 60 mg/dL); LDL Calculated 43 mg/dL (Ref range: mg/dL).  Last A1C was 02/08/2023: Hemoglobin A1C 6.7 % (H; Ref range: %).  Last TFT was 02/08/2023: TSH 5.65 uIU/mL (H; Ref range: 0.27 - 4.20 uIU/mL), 11/29/2021: Free T4 1.1 ng/dL (Ref range: 0.9 - 1.7 ng/dL).    [name]

## 2023-10-18 ENCOUNTER — Encounter: Payer: Self-pay | Admitting: Gastroenterology

## 2023-11-08 LAB — HM DIABETES EYE EXAM

## 2023-11-19 ENCOUNTER — Encounter: Payer: Self-pay | Admitting: Primary Care

## 2023-12-17 ENCOUNTER — Other Ambulatory Visit: Payer: Self-pay | Admitting: Primary Care

## 2023-12-17 DIAGNOSIS — E1169 Type 2 diabetes mellitus with other specified complication: Secondary | ICD-10-CM

## 2023-12-17 NOTE — Telephone Encounter (Signed)
[facility] Refill Note  12/17/23  [name]  1933/12/22  MRN: [id number]    Last office visit: 02/12/2023    Last telehome visit: Visit date not found    Recent Visits  No visits were found meeting these conditions.  Showing recent visits within past 185 days in an active department and meeting all other requirements  Future Appointments  No visits were found meeting these conditions.  Showing future appointments within next 0 days in an active department and meeting all other requirements      LABORATORY DATA:  Last Renal Func was  02/08/2023: Creatinine 1.40 mg/dL (H; Ref range: 1.91 - 4.78 mg/dL); eGFR BY CREAT 49 * (!; Ref range: *); Potassium 4.9 mmol/L (Ref range: 3.3 - 5.1 mmol/L).   Last LFTs was  11/29/2021: ALT 18 U/L (Ref range: 0 - 50 U/L); AST 20 U/L (Ref range: 0 - 50 U/L).   Last Lipid panel was 11/29/2021: Cholesterol 109 mg/dL (Ref range: mg/dL); HDL 35 mg/dL (L; Ref range: 40 - 60 mg/dL); LDL Calculated 43 mg/dL (Ref range: mg/dL).  Last A1C was 02/08/2023: Hemoglobin A1C 6.7 % (H; Ref range: %).  Last TFT was 02/08/2023: TSH 5.65 uIU/mL (H; Ref range: 0.27 - 4.20 uIU/mL), 11/29/2021: Free T4 1.1 ng/dL (Ref range: 0.9 - 1.7 ng/dL).    Jon [name]

## 2023-12-17 NOTE — Telephone Encounter (Signed)
Please have him schedule a follow up with Dr. Calton Golds

## 2023-12-17 NOTE — Telephone Encounter (Signed)
Appt scheduled

## 2023-12-20 NOTE — Telephone Encounter (Signed)
 Patients spouse called in regards to medication that had not been filled at pharmacy. Writer spoke to pharmacy, stated that they will be able to fill prescription on 01/06.

## 2023-12-27 ENCOUNTER — Other Ambulatory Visit: Payer: Self-pay | Admitting: Primary Care

## 2023-12-27 NOTE — Telephone Encounter (Signed)
[facility] Refill Note  12/27/23  Jon [name]  Aug 04, 1934  MRN: [id number]    Last office visit: 02/12/2023    Last telehome visit: Visit date not found    Recent Visits  No visits were found meeting these conditions.  Showing recent visits within past 185 days in an active department and meeting all other requirements  Future Appointments  No visits were found meeting these conditions.  Showing future appointments within next 0 days in an active department and meeting all other requirements      LABORATORY DATA:  Last Renal Func was  02/08/2023: Creatinine 1.40 mg/dL (H; Ref range: 0.45 - 4.09 mg/dL); eGFR BY CREAT 49 * (!; Ref range: *); Potassium 4.9 mmol/L (Ref range: 3.3 - 5.1 mmol/L).   Last LFTs was  11/29/2021: ALT 18 U/L (Ref range: 0 - 50 U/L); AST 20 U/L (Ref range: 0 - 50 U/L).   Last Lipid panel was 11/29/2021: Cholesterol 109 mg/dL (Ref range: mg/dL); HDL 35 mg/dL (L; Ref range: 40 - 60 mg/dL); LDL Calculated 43 mg/dL (Ref range: mg/dL).  Last A1C was 02/08/2023: Hemoglobin A1C 6.7 % (H; Ref range: %).  Last TFT was 02/08/2023: TSH 5.65 uIU/mL (H; Ref range: 0.27 - 4.20 uIU/mL), 11/29/2021: Free T4 1.1 ng/dL (Ref range: 0.9 - 1.7 ng/dL).    Jon [name]

## 2024-02-03 ENCOUNTER — Other Ambulatory Visit: Payer: Self-pay | Admitting: Primary Care

## 2024-02-03 DIAGNOSIS — E1169 Type 2 diabetes mellitus with other specified complication: Secondary | ICD-10-CM

## 2024-02-03 NOTE — Telephone Encounter (Signed)
 [facility] Refill Note  02/03/24  GERARD CANTARA  1934/11/25  MRN: [id number]    Last office visit: 02/12/2023    Last telehome visit: Visit date not found    Recent Visits  No visits were found meeting these conditions.  Showing recent visits within past 185 days in an active department and meeting all other requirements  Future Appointments  No visits were found meeting these conditions.  Showing future appointments within next 0 days in an active department and meeting all other requirements      LABORATORY DATA:  Last Renal Func was  02/08/2023: Creatinine 1.40 mg/dL (H; Ref range: 0.45 - 4.09 mg/dL); eGFR BY CREAT 49 * (!; Ref range: *); Potassium 4.9 mmol/L (Ref range: 3.3 - 5.1 mmol/L).   Last LFTs was  11/29/2021: ALT 18 U/L (Ref range: 0 - 50 U/L); AST 20 U/L (Ref range: 0 - 50 U/L).   Last Lipid panel was 11/29/2021: Cholesterol 109 mg/dL (Ref range: mg/dL); HDL 35 mg/dL (L; Ref range: 40 - 60 mg/dL); LDL Calculated 43 mg/dL (Ref range: mg/dL).  Last A1C was 02/08/2023: Hemoglobin A1C 6.7 % (H; Ref range: %).  Last TFT was 02/08/2023: TSH 5.65 uIU/mL (H; Ref range: 0.27 - 4.20 uIU/mL), 11/29/2021: Free T4 1.1 ng/dL (Ref range: 0.9 - 1.7 ng/dL).    Mayara Paulson Lysbeth Penner

## 2024-02-18 ENCOUNTER — Encounter: Payer: Self-pay | Admitting: Primary Care

## 2024-02-18 ENCOUNTER — Ambulatory Visit: Payer: Medicare (Managed Care) | Admitting: Primary Care

## 2024-02-18 ENCOUNTER — Other Ambulatory Visit: Payer: Self-pay

## 2024-02-18 VITALS — BP 106/68 | HR 65 | Ht 71.0 in | Wt 184.0 lb

## 2024-02-18 DIAGNOSIS — Z0000 Encounter for general adult medical examination without abnormal findings: Secondary | ICD-10-CM

## 2024-02-18 DIAGNOSIS — Z23 Encounter for immunization: Secondary | ICD-10-CM

## 2024-02-18 DIAGNOSIS — M542 Cervicalgia: Secondary | ICD-10-CM

## 2024-02-18 DIAGNOSIS — I509 Heart failure, unspecified: Secondary | ICD-10-CM

## 2024-02-18 DIAGNOSIS — E1169 Type 2 diabetes mellitus with other specified complication: Secondary | ICD-10-CM

## 2024-02-18 NOTE — Progress Notes (Signed)
 Medicare Wellness Visit Note    Visit performed as:            Office Visit, met with patient in person    Today we reviewed and updated Jon [name]'s smoking status, activities of daily living, depression screen, fall risk, medications and allergies.   I have counseled the patient in the above areas.     Subjective:     Chief Complaint: Jon [name] is a 89 y.o. male here for a/an Annual Exam, Subsequent Annual Medicare Visit, and Hypertension    In general, Jon [name] rates their overall health as:  good      Patient Care Team:  Tillie Rung, MD as PCP - General (Primary Care)  Scheryl Marten, MD (Ophthalmology)     Current Outpatient Medications on File Prior to Visit   Medication Sig Dispense Refill    OZEMPIC, 0.25 OR 0.5 MG/DOSE, pen INJECT 0.5MG  SUBCUTANEOUSLY (UNDER THE SKIN) ONCE WEEKLY 3 mL 1    finasteride (PROSCAR) 5 mg tablet TAKE 1 TABLET BY MOUTH EVERY DAY. FOR USE BY MEN ONLY 90 tablet 3    metFORMIN (GLUCOPHAGE) 500 mg tablet TAKE 1 TABLET BY MOUTH TWO TIMES DAILY WITH MEALS 180 tablet 1    pioglitazone (ACTOS) 15 mg tablet TAKE 1 TABLET BY MOUTH EVERY DAY 90 tablet 3    rosuvastatin (CRESTOR) 10 mg tablet TAKE 1 TABLET BY MOUTH EVERY DAY WITH DINNER 90 tablet 3    memantine (NAMENDA) 10 MG tablet TAKE 1 TABLET BY MOUTH TWO TIMES DAILY 180 tablet 3    ELIQUIS 5 MG tablet TAKE 1 TABLET BY MOUTH EVERY 12 HOURS 180 tablet 3    acetaminophen (TYLENOL) 500 MG capsule Take 1 capsule (500 mg total) by mouth as needed.      spironolactone (ALDACTONE) 25 mg tablet Take 1 tablet (25 mg total) by mouth daily.      dofetilide (TIKOSYN) 250 MCG capsule Take 1 capsule (250 mcg total) by mouth 2 times daily.      Cinnamon 500 MG capsule Take 500 mg by mouth      cholecalciferol (VITAMIN D-1000 MAX ST) 1,000 unit tablet Take 1 tablet (1,000 units total) by mouth daily.      metoprolol succinate ER (TOPROL-XL) 25 mg 24 hr tablet Take 1 tablet (25 mg total) by mouth every 12 hours 180 tablet 3     empagliflozin (JARDIANCE) 10 mg tablet Take 1 tablet (10 mg total) by mouth every morning 90 tablet 3    generic DME Dispense 1 single point cane. Use as directed. 1 each 0    insulin pen needle (BD PEN NEEDLE NANO 2ND GEN) 32G X 4 MM Use 1 time per week. 30 each 2    blood glucose monitor (FREESTYLE LITE) kit TEST AS DIRECTED      Lancets Micro Thin 33G MISC Use as directed once daily. Dispense lancets for one touch verio 100 each 5    blood glucose monitor w/Device KIT Test as directed 1 kit 0    Coenzyme Q10 200 MG capsule Take 1 capsule (200 mg total) by mouth daily.      Omega-3 Fatty Acids (FISH OIL) 1000 MG CAPS capsule Take 1 capsule (1 g total) by mouth daily.      lancets (ONETOUCH ULTRASOFT) Use   2  times per day as instructed for blood glucose testing. 100 each 3    Blood Glucose Calibration (OT ULTRA/FASTTK CNTRL SOLN) SOLN As  directed 1 each 5    CINNAMON PO Take by mouth      Multiple Vitamins-Minerals (MULTI VITAMIN/MINERALS PO) Take by mouth      amoxicillin (AMOXIL) 500 mg capsule Take 1 capsule (500 mg total) by mouth 2 times daily  for Simple Infection of the Urinary Tract 10 capsule 0     No current facility-administered medications on file prior to visit.     Allergies   Allergen Reactions    Bee Venom Other (See Comments)     Red and swelling    No Known Latex Allergy      Created by Conversion - 0;     Insects [Other] Other (See Comments)     Red and swelling     Patient Active Problem List    Diagnosis Date Noted    CHF (congestive heart failure) 11/20/2016     Priority: High    Paroxysmal atrial fibrillation 11/20/2016     Priority: High    Cardiomyopathy 11/12/2016     Priority: High    Protein S deficiency 11/12/2016     Priority: High    Type 2 diabetes mellitus with other specified complication 09/06/2004     Priority: High     Created by Conversion        Chronic anticoagulation 11/12/2016     Priority: Medium    Vascular dementia 11/12/2016     Priority: Medium    BPH (benign  prostatic hyperplasia) 08/07/2011     Priority: Medium    Paget's Disease 09/06/2004     Priority: Medium     Created by Conversion        Lower urinary tract symptoms (LUTS) 09/08/2014     Priority: Low    Opiate analgesic contract exists 09/09/2013     Priority: Low    Penile lesion 08/12/2012     Priority: Low    Urgency of urination 08/07/2011     Priority: Low    Obesity 09/06/2004     Priority: Low     Created by Conversion        Cognitive decline 11/10/2019    Ascending aorta dilatation 04/08/2018     3.9cm on 11/25/17; needs yearly surveillance      Chronic low back pain 09/23/2017     Past Medical History:   Diagnosis Date    Arthritis     BPH (benign prostatic hypertrophy)     CHF (congestive heart failure)     Clotting disorder     Dementia     mild    Diabetes mellitus     Paget's disease of bone     Palpitations     Protein S deficiency     On chronic anticoagulation    Sinus bradycardia     Varicella      Past Surgical History:   Procedure Laterality Date    APPENDECTOMY      CHOLECYSTECTOMY      EYE SURGERY      GALLBLADDER SURGERY      JOINT REPLACEMENT      KNEE REPLACEMENT      PR CYSTO W/URETEROSCOPY W/LITHOTRIPSY Right 03/13/2023    Procedure: RIGHT URETEROSCOPY, USING HOLMIUM LASER WITH STENT;  Surgeon: Lenard Lance, MD;  Location: [facility];  Service: Urology    SKIN GRAFT      TONSILLECTOMY       Family History   Problem Relation Age of Onset    BRCA 1/2 Brother  Arthritis Mother     Heart Disease Mother     High Blood Pressure Mother     Heart Disease Father     Stroke Father     Depression Other     Diabetes Other     Heart failure Other     Heart Disease Other     High Blood Pressure Other     Kidney Disease Other      Social History     Socioeconomic History    Marital status: Married   Occupational History    Occupation: Former Counsellor, used to work night shift   Tobacco Use    Smoking status: Never    Smokeless tobacco: Never   Substance and Sexual Activity    Alcohol use:  No    Drug use: No    Sexual activity: Never   Social History Narrative    2 daughters with neurological issues       Objective:     Vital Signs: BP 106/68   Pulse 65   Ht 1.803 m (5\' 11" )   Wt 83.5 kg (184 lb)   SpO2 99%   BMI 25.66 kg/m    BMI: Body mass index is 25.66 kg/m.    Vision Screening Results (Welcome visit only):  No results found.    Depression Screening Results:  Review Flowsheet          02/18/2024 01/24/2022 01/09/2019 12/23/2017 11/07/2016   PHQ Scores   PSQ2 Q1 - Interest/Pleasure - - N N N   PSQ2 Q2 - Down, Depressed, Hopeless - - N N N   PHQ Calculated Score 0 0 - - -   No questionnaires on file.   Opioid Use/DAST- 10 Screening Results:   How many times in the past year have you used an illegal drug or used a prescription medication for nonmedical reasons?: 0 (02/18/2024 10:50 AM)    Activities of Daily Living/Functional Screening Results:  Is the person deaf or does he/she have serious difficulty hearing?: N (02/18/2024 10:51 AM)  Is this person blind or does he/she have serious difficulty seeing even when wearing glasses?: Y (02/18/2024 10:51 AM)  *Vision Status: Visual aid  (implants) (02/18/2024 10:51 AM)  Does this person have serious difficulty walking or climbing stairs?: N (uses cane, takes time, careful) (02/18/2024 10:51 AM)  Does this person have difficulty dressing or bathing?: N (02/18/2024 10:51 AM)  *Shopping: Needs Assistance (02/18/2024 10:51 AM)  *House Keeping: Independent (02/18/2024 10:51 AM)  *Managing Own Medications: Dependent (02/18/2024 10:51 AM)  *Handling Finances: Independent (02/18/2024 10:51 AM)  If you need help, who helps you?: daughter and wife (02/18/2024 10:51 AM)  Difficulty doing errands due to a physicial, mental or emotional condition: No (02/18/2024 10:51 AM)  Difficulty remembering or making decisions due to a physicial, mental or emotional condition: No (02/18/2024 10:51 AM)      Fall Risk Screening Results:  Have you fallen in the last year?: No (02/18/2024 10:49 AM)  Do you feel  you are at risk for falling?: No (02/18/2024 10:49 AM)      Assessment and Plan:     Cognitive Function:  Recall of recent and remote events appears:  Normal      Advanced Care Planning:  was discussed and the paperwork can be found in the scanned media section     The following health maintenance plan was reviewed with the patient:    Health Maintenance Topics with due status: Overdue  Topic Date Due    Hepatitis C Screening USPSTF/[place] Never done    IMM DTaP/Tdap/Td Never done    IMM-Zoster Never done    Diabetic Foot Exam ADA 06/22/2022    Diabetic A1C Monitoring Other 05/09/2023    IMM-Influenza 08/18/2023    COVID-19 Vaccine 08/18/2023     Health Maintenance Topics with due status: Postponed       Topic Postponed Until    HIV Screening [place] 10/22/2029 (Originally 03/09/1947)     Health Maintenance Topics with due status: Not Due       Topic Last Completion Date    Diabetic Eye Exam ADA 11/08/2023    Depression Screen Yearly 02/18/2024    Fall Risk Screening 02/18/2024     Health Maintenance Topics with due status: Completed       Topic Last Completion Date    IMM Pneumo: 50+ Years 05/18/2016     Health Maintenance Topics with due status: Aged [employer] Date Due    IMM-Hepatitis B Vaccine Aged Out    IMM-HIB 0-5 Yrs or At-Risk Patients Aged Out    IMM-HPV 9-26 Yrs or Shared Decision (27-45 Yrs) Aged Out    IMM-MCV4 0-18 Yrs or At-Risk Patients Aged Out    IMM-Rotavirus 0-8 Months Aged Out    IMM-MenB (2 Plans: Shared decision & Increased Risk Plans) Aged Out     This health maintenance schedule, identified risks, a list of orders placed today and patient goals have been provided to [employer] in the after visit summary.     Plan for any concerns identified during screening or risk assessments:  Labs due     Rica Mast, MD, MPH  [facility]

## 2024-02-18 NOTE — Patient Instructions (Signed)
 Thank you for completing your Annual Exam, Subsequent Annual Medicare Visit, and Hypertension   with [place] today.     The purpose of this visits was to:    Screen for disease  Assess risk of future medical problems  Help develop a healthy lifestyle  Update vaccines  Get to know your doctor in case of an illness    Patient Care Team:  Tillie Rung, MD as PCP - General (Primary Care)  Scheryl Marten, MD (Ophthalmology)     Medicare 5 Year Plan    The following items were identified as areas of concern during your screening today:  Diabetes - This is a risk factor for Heart Attack, Stroke, Kidney Problems, Eye Problems and a loss of feeling in your fingers and feet.   BMI greater than 25 - This is a risk for Heart Attack, Stroke, High Blood Pressure, Diabetes, High Cholesterol and other complications.       The Health Maintenance table below identifies screening tests and immunizations recommended by your health care team:  Health Maintenance: These screening recommendations are based on USPSTF, [employer], and [place] state guidelines   Topic Date Due   . Hepatitis C Screening  Never done   . DTaP/Tdap/Td Vaccines (1 - Tdap) Never done   . Shingles Vaccine (1 of 2) Never done   . Diabetic Foot Exam  06/22/2022   . Diabetic A1C Monitoring  05/09/2023   . Flu Shot (1) 08/18/2023   . COVID-19 Vaccine (3 - 2024-25 season) 08/18/2023   . HIV Screening  10/22/2029 (Originally 03/09/1947)   . Depression - Yearly  02/17/2025   . Fall Risk Screening  02/17/2025   . Diabetic Eye Exam  11/07/2025   . Pneumococcal Vaccination  Completed   . Hepatitis B Vaccine  Aged Out   . HIB Vaccine  Aged Out   . HPV Vaccine  Aged Out   . Meningococcal Vaccine  Aged Out   . Rotavirus Vaccine  Aged Out   . Meningitis Vaccine  Aged Out     In addition, goals and orders placed to address these recommendations are listed in the "Today's Visit" section.    We wish you the best of health and look forward to seeing you again next year for your  Annual Medicare Wellness Visit.     If you have any health care concerns before then, please do not hesitate to contact us.
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
## 2024-02-21 ENCOUNTER — Encounter: Payer: Self-pay | Admitting: Gastroenterology

## 2024-03-06 ENCOUNTER — Encounter: Payer: Self-pay | Admitting: Gastroenterology

## 2024-03-09 ENCOUNTER — Other Ambulatory Visit: Payer: Self-pay | Admitting: Primary Care

## 2024-03-09 DIAGNOSIS — E1169 Type 2 diabetes mellitus with other specified complication: Secondary | ICD-10-CM

## 2024-03-09 NOTE — Telephone Encounter (Signed)
 [facility] Refill Note  03/09/24  [name]  01-20-1934  MRN: [id number]    Last office visit: 02/18/2024    Last telehome visit: Visit date not found    Recent Visits  Date Type Provider Dept   02/18/24 Office Visit Tillie Rung, MD [facility]   Showing recent visits within past 185 days in an active department and meeting all other requirements  Future Appointments  No visits were found meeting these conditions.  Showing future appointments within next 0 days in an active department and meeting all other requirements      LABORATORY DATA:  Last Renal Func was  02/08/2023: Creatinine 1.40 mg/dL (H; Ref range: 0.45 - 4.09 mg/dL); eGFR BY CREAT 49 * (!; Ref range: *); Potassium 4.9 mmol/L (Ref range: 3.3 - 5.1 mmol/L).   Last LFTs was  11/29/2021: ALT 18 U/L (Ref range: 0 - 50 U/L); AST 20 U/L (Ref range: 0 - 50 U/L).   Last Lipid panel was 11/29/2021: Cholesterol 109 mg/dL (Ref range: mg/dL); HDL 35 mg/dL (L; Ref range: 40 - 60 mg/dL); LDL Calculated 43 mg/dL (Ref range: mg/dL).  Last A1C was 02/08/2023: Hemoglobin A1C 6.7 % (H; Ref range: %).  Last TFT was 02/08/2023: TSH 5.65 uIU/mL (H; Ref range: 0.27 - 4.20 uIU/mL), 11/29/2021: Free T4 1.1 ng/dL (Ref range: 0.9 - 1.7 ng/dL).    [name]

## 2024-03-16 ENCOUNTER — Ambulatory Visit: Payer: Medicare (Managed Care) | Admitting: Physical Medicine and Rehabilitation

## 2024-03-16 ENCOUNTER — Other Ambulatory Visit: Payer: Self-pay

## 2024-03-27 ENCOUNTER — Ambulatory Visit
Admission: RE | Admit: 2024-03-27 | Discharge: 2024-03-27 | Disposition: A | Discharge status: Home / Self Care | Payer: Medicare (Managed Care) | Source: Ambulatory Visit

## 2024-03-27 ENCOUNTER — Ambulatory Visit: Payer: Medicare (Managed Care) | Admitting: Physical Medicine and Rehabilitation

## 2024-03-27 ENCOUNTER — Encounter: Payer: Self-pay | Admitting: Physical Medicine and Rehabilitation

## 2024-03-27 ENCOUNTER — Other Ambulatory Visit: Payer: Self-pay

## 2024-03-27 VITALS — BP 99/67 | HR 95 | Ht 71.0 in | Wt 180.0 lb

## 2024-03-27 DIAGNOSIS — M2578 Osteophyte, vertebrae: Secondary | ICD-10-CM

## 2024-03-27 DIAGNOSIS — M47812 Spondylosis without myelopathy or radiculopathy, cervical region: Secondary | ICD-10-CM | POA: Insufficient documentation

## 2024-03-27 NOTE — Progress Notes (Signed)
 History of Present Illness  Chief Complaint   Patient presents with    Neck - New Patient Visit    Lower Back - New Patient Visit       Neck Pain   This is a chronic problem. Episode onset: years. The problem occurs constantly. The problem has been waxing and waning. Pain location: midline mid and lower cervical region. The quality of the pain is described as aching. The pain is at a severity of 6/10. Exacerbated by: standing. Mitigated by sitting. He has tried heat (he is scheduled to start acupuncture) for the symptoms. Improvement on treatment: pain meds have included tylenol. NSAIDS contraindicated secondary to eliquis.             Review of Systems   Musculoskeletal:  Positive for neck pain.        Past Medical History:   Diagnosis Date    Arthritis     BPH (benign prostatic hypertrophy)     CHF (congestive heart failure)     Clotting disorder     Dementia     mild    Diabetes mellitus     Paget's disease of bone     Palpitations     Protein S deficiency     On chronic anticoagulation    Sinus bradycardia     Varicella      Social History     Socioeconomic History    Marital status: Married   Tobacco Use    Smoking status: Never    Smokeless tobacco: Never   Substance and Sexual Activity    Alcohol use: No    Drug use: No    Sexual activity: Never   Social History Narrative    2 daughters with neurological issues     Family History   Problem Relation Age of Onset    BRCA 1/2 Brother     Arthritis Mother     Heart Disease Mother     High Blood Pressure Mother     Heart Disease Father     Stroke Father     Depression Other     Diabetes Other     Heart failure Other     Heart Disease Other     High Blood Pressure Other     Kidney Disease Other           I have reviewed the patient's medications and allergies and updated them as appropriate in the electronic medical record.       Objective:     Today's vitals:  height is 1.803 m (5\' 11" ) and weight is 81.6 kg (180 lb). His blood pressure is 99/67 and his pulse is  95.      Physical Exam  Vitals reviewed.   Constitutional:       Appearance: He is well-developed.   HENT:      Head: Normocephalic.   Pulmonary:      Effort: Pulmonary effort is normal.   Skin:     General: Skin is dry.   Neurological:      Mental Status: He is alert.      Motor: Motor strength is normal.Motor function is intact.       Neurological Exam  Mental Status  Alert.    Motor   Strength is 5/5 throughout all four extremities.    Sensory  Light touch is normal in upper and lower extremities.     Reflexes    Right pathological reflexes: Hoffmann's absent. Ankle clonus  absent.  Left pathological reflexes: Hoffmann's absent. Ankle clonus absent.    Gait    Ambulates with spc.                Assessment/Plan       Cervical spondylosis without myelopathy  1. Axial neck pain c/w painful facet joint arthrosis C56 vs C45 vs IDD C45 vs C56.    1. The patient will undergo cervical plain film xrays with flexion/extension views to evaluate intervertebral disc height, spinal alignment, and assess for instability.  2. Continue home exercise program. Spine-specific cervical stabilization exercise program and spine ergonomic hygiene handouts provided today.  3. The patient was prescribed spine focused physical therapy to improve spine biomechanics and reduce strain on the spine during recreational and IADL activities.  4. If no relief, the patient will undergo cervical spine MRI to evaluate for morphologic abnormalities.  5. He will pursue acupuncture as previously prescribed.  6. Follow up after PT trial.                    Follow up  recommended: Follow up for Reassessment in 6-12 weeks.     Patient verbalized understanding of the above instructions.     Current Outpatient Medications   Medication    metFORMIN (GLUCOPHAGE) 500 mg tablet    OZEMPIC, 0.25 OR 0.5 MG/DOSE, pen    finasteride (PROSCAR) 5 mg tablet    pioglitazone (ACTOS) 15 mg tablet    rosuvastatin (CRESTOR) 10 mg tablet    memantine (NAMENDA) 10 MG tablet     ELIQUIS 5 MG tablet    acetaminophen (TYLENOL) 500 MG capsule    spironolactone (ALDACTONE) 25 mg tablet    dofetilide (TIKOSYN) 250 MCG capsule    Cinnamon 500 MG capsule    cholecalciferol (VITAMIN D-1000 MAX ST) 1,000 unit tablet    amoxicillin (AMOXIL) 500 mg capsule    generic DME    insulin pen needle (BD PEN NEEDLE NANO 2ND GEN) 32G X 4 MM    blood glucose monitor (FREESTYLE LITE) kit    Lancets Micro Thin 33G MISC    blood glucose monitor w/Device KIT    Coenzyme Q10 200 MG capsule    Omega-3 Fatty Acids (FISH OIL) 1000 MG CAPS capsule    lancets (ONETOUCH ULTRASOFT)    Blood Glucose Calibration (OT ULTRA/FASTTK CNTRL SOLN) SOLN    CINNAMON PO    Multiple Vitamins-Minerals (MULTI VITAMIN/MINERALS PO)    metoprolol succinate ER (TOPROL-XL) 25 mg 24 hr tablet    empagliflozin (JARDIANCE) 10 mg tablet     No current facility-administered medications for this visit.       Yitzchok Carriger L Oluwasemilore Bahl, DO  03/27/2024     Shailen Thielen L Yamaira Spinner, DO  Electronically signed on 03/27/2024 at 11:14 AM.

## 2024-03-27 NOTE — Assessment & Plan Note (Addendum)
 1. Axial neck pain c/w painful facet joint arthrosis C56 vs C45 vs IDD C45 vs C56.    1. The patient will undergo cervical plain film xrays with flexion/extension views to evaluate intervertebral disc height, spinal alignment, and assess for instability.  2. Continue home exercise program. Spine-specific cervical stabilization exercise program and spine ergonomic hygiene handouts provided today.  3. The patient was prescribed spine focused physical therapy to improve spine biomechanics and reduce strain on the spine during recreational and IADL activities.  4. If no relief, the patient will undergo cervical spine MRI to evaluate for morphologic abnormalities.  5. He will pursue acupuncture as previously prescribed.  6. Follow up after PT trial.

## 2024-03-30 ENCOUNTER — Telehealth: Payer: Self-pay

## 2024-03-30 ENCOUNTER — Other Ambulatory Visit: Payer: Self-pay | Admitting: Physical Medicine and Rehabilitation

## 2024-03-30 DIAGNOSIS — M47812 Spondylosis without myelopathy or radiculopathy, cervical region: Secondary | ICD-10-CM

## 2024-03-30 NOTE — Telephone Encounter (Signed)
 Patient wife called.  States she would like a prescription to be sent in for pain for the patient.  States he is 89 years old and needs comfort.  States that he is in a lot of pain in the morning when he wakes up.  Can be reached at [phone] if there are any questions.    Would like prescription sent to   [id number] - [place], [place] - [address]       Thank you

## 2024-03-30 NOTE — Telephone Encounter (Signed)
 Spoke with patient's wife.  She will schedule MRI and then call me directly to schedule FUV

## 2024-03-30 NOTE — Progress Notes (Signed)
 Patient seen most recently 03/27/24. Recall history below.    Neck Pain   This is a chronic problem. Episode onset: years. The problem occurs constantly. The problem has been waxing and waning. Pain location: midline mid and lower cervical region. The quality of the pain is described as aching. The pain is at a severity of 6/10. Exacerbated by: standing. Mitigated by sitting. He has tried heat (he is scheduled to start acupuncture) for the symptoms. Improvement on treatment: pain meds have included tylenol. NSAIDS contraindicated secondary to eliquis.     Patient has attempted home exercises provided to him 03/27/24 but has worsened symptoms.    We are therefore requesting an MRI of the cervical spine to further evaluate.    Abdul Abe, MD  Interventional Spine and MSK Fellow  [facility]

## 2024-03-31 ENCOUNTER — Ambulatory Visit: Payer: Self-pay | Admitting: Physical Medicine and Rehabilitation

## 2024-04-02 ENCOUNTER — Encounter: Payer: Self-pay | Admitting: Gastroenterology

## 2024-04-07 ENCOUNTER — Other Ambulatory Visit: Payer: Self-pay | Admitting: Primary Care

## 2024-04-07 ENCOUNTER — Ambulatory Visit: Payer: Medicare (Managed Care) | Admitting: Acupuncturist

## 2024-04-07 ENCOUNTER — Other Ambulatory Visit: Payer: Self-pay

## 2024-04-07 DIAGNOSIS — M542 Cervicalgia: Secondary | ICD-10-CM

## 2024-04-07 DIAGNOSIS — E1169 Type 2 diabetes mellitus with other specified complication: Secondary | ICD-10-CM

## 2024-04-07 NOTE — Telephone Encounter (Signed)
 MAP Med Refill Note  04/07/24  [name]  Apr 11, 1934  MRN: [id number]    Last office visit: 02/18/2024    Last telehome visit: Visit date not found    Recent Visits  Date Type Provider Dept   02/18/24 Office Visit Rachel Budds, MD [facility]   Showing recent visits within past 185 days in an active department and meeting all other requirements  Future Appointments  No visits were found meeting these conditions.  Showing future appointments within next 0 days in an active department and meeting all other requirements      LABORATORY DATA:  Last Renal Func was  02/08/2023: Creatinine 1.40 mg/dL (H; Ref range: 0.45 - 4.09 mg/dL); eGFR BY CREAT 49 * (!; Ref range: *); Potassium 4.9 mmol/L (Ref range: 3.3 - 5.1 mmol/L).   Last LFTs was  11/29/2021: ALT 18 U/L (Ref range: 0 - 50 U/L); AST 20 U/L (Ref range: 0 - 50 U/L).   Last Lipid panel was 11/29/2021: Cholesterol 109 mg/dL (Ref range: mg/dL); HDL 35 mg/dL (L; Ref range: 40 - 60 mg/dL); LDL Calculated 43 mg/dL (Ref range: mg/dL).  Last A1C was 02/08/2023: Hemoglobin A1C 6.7 % (H; Ref range: %).  Last TFT was 02/08/2023: TSH 5.65 uIU/mL (H; Ref range: 0.27 - 4.20 uIU/mL), 11/29/2021: Free T4 1.1 ng/dL (Ref range: 0.9 - 1.7 ng/dL).    Jon [name]
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
## 2024-04-14 ENCOUNTER — Ambulatory Visit
Admission: RE | Admit: 2024-04-14 | Discharge: 2024-04-14 | Disposition: A | Discharge status: Home / Self Care | Payer: Medicare (Managed Care) | Source: Ambulatory Visit | Attending: Physical Medicine and Rehabilitation | Admitting: Physical Medicine and Rehabilitation

## 2024-04-14 ENCOUNTER — Other Ambulatory Visit: Payer: Self-pay

## 2024-04-14 DIAGNOSIS — M4802 Spinal stenosis, cervical region: Secondary | ICD-10-CM

## 2024-04-14 DIAGNOSIS — M47812 Spondylosis without myelopathy or radiculopathy, cervical region: Secondary | ICD-10-CM | POA: Insufficient documentation

## 2024-04-28 ENCOUNTER — Other Ambulatory Visit: Payer: Self-pay

## 2024-04-28 ENCOUNTER — Encounter: Payer: Self-pay | Admitting: Orthopedic Surgery

## 2024-04-28 ENCOUNTER — Ambulatory Visit: Payer: Medicare (Managed Care) | Admitting: Acupuncturist

## 2024-04-28 ENCOUNTER — Ambulatory Visit: Payer: Medicare (Managed Care) | Attending: Physical Medicine and Rehabilitation | Admitting: Orthopedic Surgery

## 2024-04-28 DIAGNOSIS — M542 Cervicalgia: Secondary | ICD-10-CM

## 2024-04-28 DIAGNOSIS — M47812 Spondylosis without myelopathy or radiculopathy, cervical region: Secondary | ICD-10-CM

## 2024-04-28 NOTE — Progress Notes (Unsigned)
 History of Present Illness  Chief Complaint   Patient presents with    Neck - Follow-up       Neck  Associated symptoms include neck pain.        The patient presents in follow up for the above symptoms.  The patient is accompanied today by family.  This is a chronic problem. Episode onset: years. The problem occurs constantly. The problem has been waxing and waning. Pain location: midline mid and lower cervical region. The quality of the pain is described as aching. The pain is at a severity of 6/10. Exacerbated by: standing. Mitigated by sitting. He has tried heat (he is scheduled to start acupuncture) for the symptoms. Improvement on treatment: pain meds have included tylenol . NSAIDS contraindicated secondary to eliquis . He has appreciated good relief of his pain with acupuncture.  He is here today for MRI review.     Review of Systems   Constitutional: Negative.    Musculoskeletal:  Positive for neck pain.        Past Medical History:   Diagnosis Date    Arthritis     BPH (benign prostatic hypertrophy)     CHF (congestive heart failure)     Clotting disorder     Dementia     mild    Diabetes mellitus     Paget's disease of bone     Palpitations     Protein S deficiency     On chronic anticoagulation    Sinus bradycardia     Varicella      Social History     Socioeconomic History    Marital status: Married   Tobacco Use    Smoking status: Never    Smokeless tobacco: Never   Substance and Sexual Activity    Alcohol use: No    Drug use: No    Sexual activity: Never   Social History Narrative    2 daughters with neurological issues     Family History   Problem Relation Age of Onset    BRCA 1/2 Brother     Arthritis Mother     Heart Disease Mother     High Blood Pressure Mother     Heart Disease Father     Stroke Father     Depression Other     Diabetes Other     Heart failure Other     Heart Disease Other     High Blood Pressure Other     Kidney Disease Other           I have reviewed the patient's medications and  allergies and updated them as appropriate in the electronic medical record.       Objective:     Today's vitals:  height is 1.803 m (5\' 11" ) (pended) and weight is 81.6 kg (180 lb) (pended). His blood pressure is 96/55 (pended) and his pulse is 78 (pended).      Physical Exam  Constitutional:       Appearance: Normal appearance.   HENT:      Head: Normocephalic.      Nose: Nose normal.   Pulmonary:      Effort: Pulmonary effort is normal.   Musculoskeletal:         General: Normal range of motion.      Cervical back: Normal range of motion.   Skin:     General: Skin is warm and dry.      Capillary Refill: Capillary refill takes less than  2 seconds.   Neurological:      General: No focal deficit present.      Mental Status: He is alert and oriented to person, place, and time.      Motor: Motor strength is normal.     Deep Tendon Reflexes:      Reflex Scores:       Bicep reflexes are 1+ on the right side and 1+ on the left side.       Patellar reflexes are 1+ on the right side and 1+ on the left side.       Achilles reflexes are 1+ on the right side and 1+ on the left side.  Psychiatric:         Mood and Affect: Mood normal.         Speech: Speech normal.         Behavior: Behavior normal.       Neurological Exam  Mental Status  Alert. Oriented to person, place and time. Oriented to person, place, and time. Recent and remote memory are intact. Speech is normal. Language is fluent with no aphasia. Attention and concentration are normal.    Motor  Normal muscle bulk throughout. Normal muscle tone. No abnormal involuntary movements. Strength is 5/5 throughout all four extremities.    Reflexes                                            Right                      Left  Biceps                                 1+                         1+  Patellar                                1+                         1+  Achilles                                1+                         1+    Right pathological reflexes: Hoffmann's  absent. Ankle clonus absent.  Left pathological reflexes: Hoffmann's absent. Ankle clonus absent.    Gait    The patient is using a wheelchair for ambulation today.  Otherwise he uses a cane..              I personally reviewed the MRI cervical spine which showed, Per Dr. Esta Heidelberg:    Near complete disc height loss C34, C56 and C67   Moderate to severe b/l C5 and left C6 foraminal stenosis   Moderate right C7 foraminal stenosis   Assessment/Plan       Cervical spondylosis without myelopathy  1. Axial neck pain c/w painful facet joint arthrosis C56 vs C45 vs IDD C45 vs C56.  1. Continue acupuncture as scheduled.  2. Continue home exercise program.   3. Follow up with Samie Crews NP in one month.                    Follow up  recommended: No follow-ups on file.     Patient verbalized understanding of the above instructions.     Current Outpatient Medications   Medication    OZEMPIC , 0.25 OR 0.5 MG/DOSE, pen    metFORMIN  (GLUCOPHAGE ) 500 mg tablet    finasteride  (PROSCAR ) 5 mg tablet    pioglitazone  (ACTOS ) 15 mg tablet    rosuvastatin  (CRESTOR ) 10 mg tablet    memantine  (NAMENDA ) 10 MG tablet    ELIQUIS  5 MG tablet    acetaminophen  (TYLENOL ) 500 MG capsule    spironolactone (ALDACTONE) 25 mg tablet    dofetilide (TIKOSYN) 250 MCG capsule    Cinnamon 500 MG capsule    cholecalciferol (VITAMIN D -1000 MAX ST) 1,000 unit tablet    metoprolol  succinate ER (TOPROL -XL) 25 mg 24 hr tablet    empagliflozin  (JARDIANCE ) 10 mg tablet    amoxicillin  (AMOXIL ) 500 mg capsule    generic DME    insulin  pen needle (BD PEN NEEDLE NANO 2ND GEN) 32G X 4 MM    blood glucose monitor (FREESTYLE LITE) kit    Lancets Micro Thin 33G MISC    blood glucose monitor w/Device KIT    Coenzyme Q10 200 MG capsule    Omega-3 Fatty Acids (FISH OIL) 1000 MG CAPS capsule    lancets (ONETOUCH ULTRASOFT)    Blood Glucose Calibration (OT ULTRA/FASTTK CNTRL SOLN) SOLN    CINNAMON PO    Multiple Vitamins-Minerals (MULTI VITAMIN/MINERALS PO)     No current  facility-administered medications for this visit.       Samie Crews, NP  04/28/2024     Samie Crews, NP  Electronically signed on 04/30/2024 at 2:51 PM.

## 2024-04-28 NOTE — Progress Notes (Signed)
 ACUPUNCTURE  FOLLOW UP TREATMENT  NOTE       Patient: Jon [name]  MRN: [id number]  DOB: July 02, 1934    04/28/2024    SUBJECTIVE    Complaint Status  1.  Cervicalgia  Moderate    Changes since last treatment    Pt reports that he was pain free for ~6 days after his last visit. He continues to endorse chronic neck pain and stiffness today, however. He has decreased ROM with rotation, flexion, lateral flexion and extension of the neck. He is here with his daughter today. Pt arrives by wheelchair but was able to ambulate with the assistance of a cane. He denies any other Sx or complaints today.     Today's Treatment Intention / Goal:  Alleviate chronic neck pain    TREATMENT    Acupuncture  Prone:    Bilateral:  Semispinalis Capitis MP x 2  Levator Scapula MP x 2  Upper Trap MP x 2     Electro   Ran 1 Hz continuous on all of the above MP. Applied Po Sum On oil to the neck and upper back post tx.     Treatment Number  2     Assessment:    Findings consistent with 89 y.o. male with chronic neck pain.  Prognosis:  Good   Contraindications/Precautions/Limitations:  Per protocol    Treatment Plan:   Options / plan reviewed with patient:  Yes  Treatment Freq / Recommendations 1 times per week for 1 month(s)    [x]  Todays visit included acupuncture with the use of electrical stimulation    Juanda Noon, LAC      Minutes   Initial Time Based CPT  Initial Acupuncture  Initial Acupuncture with Electrical Stimulation    15 min   Subsequent Time Based CPT  Subsequent Unit of Acupuncture  Subsequent Unit of Acupuncture with Electrical Stimulation       10 min   Non-Billable Time:  Retained Needle Time, Heat, Ice, Rest 10 min   Time spent with patient (face to face contact): 25 min         Total Treatment Time 35 min

## 2024-04-28 NOTE — Assessment & Plan Note (Signed)
 1. Axial neck pain c/w painful facet joint arthrosis C56 vs C45 vs IDD C45 vs C56.    1. Continue acupuncture as scheduled.  2. Continue home exercise program.   3. Follow up with Samie Crews NP in one month.

## 2024-05-05 ENCOUNTER — Ambulatory Visit: Payer: Medicare (Managed Care) | Admitting: Acupuncturist

## 2024-05-05 DIAGNOSIS — M542 Cervicalgia: Secondary | ICD-10-CM

## 2024-05-05 NOTE — Progress Notes (Signed)
 ACUPUNCTURE  FOLLOW UP TREATMENT  NOTE       Patient: Jon [name]  MRN: [id number]  DOB: 12/05/1934    05/05/2024    SUBJECTIVE    Complaint Status  1.  Cervicalgia  Moderate    Changes since last treatment    Pt reports that he was pain free for 5-6 days after his last visit. He continues to endorse chronic neck pain and stiffness today, however. He has decreased ROM with rotation, flexion, lateral flexion and extension of the neck. The left side of his neck and upper back have increased hypertonicity He is here with his daughter today. He is ambulatory with the assistance of a cane today. He denies any other Sx or complaints today.     Today's Treatment Intention / Goal:  Alleviate chronic neck pain    TREATMENT    Acupuncture  Prone:    Bilateral:  Semispinalis Capitis MP  Levator Scapula MP  Upper Trap MP x 2  Middle Trap MP  Lower Trap MP     Electro   Ran 1-4 Hz mixed on all of the above MP. Placed the heat lamp over the neck and upper back for the duration of the tx. Applied Po Sum On oil to the neck and upper back post tx.     Treatment Number  3     Assessment:    Findings consistent with 89 y.o. male with chronic neck pain.  Prognosis:  Good   Contraindications/Precautions/Limitations:  Per protocol    Treatment Plan:   Options / plan reviewed with patient:  Yes  Treatment Freq / Recommendations 1 times per week for 1 month(s)    [x]  Todays visit included acupuncture with the use of electrical stimulation    Juanda Noon, LAC      Minutes   Initial Time Based CPT  Initial Acupuncture  Initial Acupuncture with Electrical Stimulation    15 min   Subsequent Time Based CPT  Subsequent Unit of Acupuncture  Subsequent Unit of Acupuncture with Electrical Stimulation       10 min   Non-Billable Time:  Retained Needle Time, Heat, Ice, Rest 10 min   Time spent with patient (face to face contact): 25 min         Total Treatment Time 35 min

## 2024-05-13 ENCOUNTER — Ambulatory Visit: Payer: Medicare (Managed Care) | Admitting: Orthopedic Surgery

## 2024-05-14 ENCOUNTER — Ambulatory Visit: Payer: Medicare (Managed Care) | Admitting: Acupuncturist

## 2024-05-14 ENCOUNTER — Other Ambulatory Visit: Payer: Self-pay

## 2024-05-14 DIAGNOSIS — M542 Cervicalgia: Secondary | ICD-10-CM

## 2024-05-14 NOTE — Progress Notes (Signed)
 ACUPUNCTURE  FOLLOW UP TREATMENT  NOTE       Patient: Jon [name]  MRN: [id number]  DOB: 02/28/1934    05/14/2024    SUBJECTIVE    Complaint Status  1.  Cervicalgia  Moderate    Changes since last treatment    Pt reports that his neck continues to improve. While he admits that he is not completely pain free, he states that his neck is in much better shape than it was prior to acupuncture. He states that acupuncture takes the edge off and makes his neck pain more tolerable. He denies any other Sx or complaints today.     Today's Treatment Intention / Goal:  Alleviate chronic neck pain    TREATMENT    Acupuncture  Prone:    Bilateral:   Semispinalis Capitis MP x 2  Scalene MP  Upper Trap MP   Middle Trap MP  Lower Trap MP     Electro   Ran 1 Hz mixed on all of the above MP. Placed the heat lamp over the neck and upper back for the duration of the tx. Did IASTM on the b/l neck and upper back.      Treatment Number  4     Assessment:    Findings consistent with 89 y.o. male with chronic neck pain.  Prognosis:  Good   Contraindications/Precautions/Limitations:  Per protocol    Treatment Plan:   Options / plan reviewed with patient:  Yes  Treatment Freq / Recommendations 1 times per week for 1 month(s)    [x]  Todays visit included acupuncture with the use of electrical stimulation    Juanda Noon, LAC      Minutes   Initial Time Based CPT  Initial Acupuncture  Initial Acupuncture with Electrical Stimulation    15 min   Subsequent Time Based CPT  Subsequent Unit of Acupuncture  Subsequent Unit of Acupuncture with Electrical Stimulation       10 min   Non-Billable Time:  Retained Needle Time, Heat, Ice, Rest 10 min   Time spent with patient (face to face contact): 25 min         Total Treatment Time 35 min

## 2024-05-21 ENCOUNTER — Ambulatory Visit: Payer: Medicare (Managed Care) | Admitting: Acupuncturist

## 2024-05-21 ENCOUNTER — Other Ambulatory Visit: Payer: Self-pay

## 2024-05-21 DIAGNOSIS — M542 Cervicalgia: Secondary | ICD-10-CM

## 2024-05-21 NOTE — Progress Notes (Signed)
 ACUPUNCTURE  FOLLOW UP TREATMENT  NOTE       Patient: Jon [name]  MRN: [id number]  DOB: March 14, 1934    05/21/2024    SUBJECTIVE    Complaint Status  1.  Cervicalgia  Moderate    Changes since last treatment    Pt reports that his neck continues to improve with acupuncture. While he admits that he is not completely pain free, he states that his neck is in much better shape than it was prior to acupuncture. He states that acupuncture seems to take the edge off and makes his neck pain more tolerable. He states that the left side of his neck feels slightly tighter than the right today. He denies any other Sx or complaints today.     Today's Treatment Intention / Goal:  Alleviate chronic neck pain    TREATMENT    Acupuncture  Prone:    Bilateral:   Semispinalis Capitis MP  Levator Scapula MP  Upper Trap MP      Electro   Ran 1 Hz continuous on all of the above MP. Placed the heat lamp over the neck and upper back for the duration of the tx. Applied Po Sum On oil to the neck and upper back post tx.      Treatment Number  5     Assessment:    Findings consistent with 89 y.o. male with chronic neck pain.  Prognosis:  Good   Contraindications/Precautions/Limitations:  Per protocol    Treatment Plan:   Options / plan reviewed with patient:  Yes  Treatment Freq / Recommendations 1 times per week for 1 month(s)    [x]  Todays visit included acupuncture with the use of electrical stimulation    Juanda Noon, LAC      Minutes   Initial Time Based CPT  Initial Acupuncture  Initial Acupuncture with Electrical Stimulation    15 min   Subsequent Time Based CPT  Subsequent Unit of Acupuncture  Subsequent Unit of Acupuncture with Electrical Stimulation       10 min   Non-Billable Time:  Retained Needle Time, Heat, Ice, Rest 10 min   Time spent with patient (face to face contact): 25 min         Total Treatment Time 35 min

## 2024-05-26 ENCOUNTER — Other Ambulatory Visit: Payer: Self-pay | Admitting: Primary Care

## 2024-05-26 DIAGNOSIS — E1169 Type 2 diabetes mellitus with other specified complication: Secondary | ICD-10-CM

## 2024-05-26 NOTE — Telephone Encounter (Signed)
 [facility] Refill Note  05/26/24  Jon [name]  1933-12-20  MRN: [id number]    Last office visit: 02/18/2024    Last telehome visit: Visit date not found    Recent Visits  Date Type Provider Dept   02/18/24 Office Visit Rachel Budds, MD [facility]   Showing recent visits within past 185 days in an active department and meeting all other requirements  Future Appointments  No visits were found meeting these conditions.  Showing future appointments within next 0 days in an active department and meeting all other requirements      LABORATORY DATA:  Last Renal Func was  02/08/2023: Creatinine 1.40 mg/dL (H; Ref range: 0.45 - 4.09 mg/dL); eGFR BY CREAT 49 * (!; Ref range: *); Potassium 4.9 mmol/L (Ref range: 3.3 - 5.1 mmol/L).   Last LFTs was  11/29/2021: ALT 18 U/L (Ref range: 0 - 50 U/L); AST 20 U/L (Ref range: 0 - 50 U/L).   Last Lipid panel was 11/29/2021: Cholesterol 109 mg/dL (Ref range: mg/dL); HDL 35 mg/dL (L; Ref range: 40 - 60 mg/dL); LDL Calculated 43 mg/dL (Ref range: mg/dL).  Last A1C was 02/08/2023: Hemoglobin A1C 6.7 % (H; Ref range: %).  Last TFT was 02/08/2023: TSH 5.65 uIU/mL (H; Ref range: 0.27 - 4.20 uIU/mL), 11/29/2021: Free T4 1.1 ng/dL (Ref range: 0.9 - 1.7 ng/dL).    [name]

## 2024-06-02 ENCOUNTER — Ambulatory Visit: Payer: Medicare (Managed Care) | Admitting: Orthopedic Surgery

## 2024-06-07 ENCOUNTER — Other Ambulatory Visit: Payer: Self-pay | Admitting: Primary Care

## 2024-06-07 DIAGNOSIS — I480 Paroxysmal atrial fibrillation: Secondary | ICD-10-CM

## 2024-06-08 NOTE — Telephone Encounter (Signed)
 [facility] Refill Note  06/08/24  Jon [name]  05-14-1934  MRN: [id number]    Last office visit: 02/18/2024    Last telehome visit: Visit date not found    Last APP visit:  Visit date not found    Recent Visits  Date Type Provider Dept   02/18/24 Office Visit [name], MD [facility]   Showing recent visits within past 185 days in an active department and meeting all other requirements  Future Appointments  No visits were found meeting these conditions.  Showing future appointments within next 0 days in an active department and meeting all other requirements      Opioid Drug Screen:  No results for input(s): AMPU, BEU, OPSU, OXYU, THCU, BZDU, COPS, CTHC, UCRNC, TRAMD in the last 8760 hours.    LABORATORY DATA:  Last Renal Func was  02/08/2023: Creatinine 1.40 mg/dL (H; Ref range: 9.32 - 8.82 mg/dL); eGFR BY CREAT 49 * (!; Ref range: *); Potassium 4.9 mmol/L (Ref range: 3.3 - 5.1 mmol/L).   Last LFTs was  11/29/2021: ALT 18 U/L (Ref range: 0 - 50 U/L); AST 20 U/L (Ref range: 0 - 50 U/L).   Last Lipid panel was 11/29/2021: Cholesterol 109 mg/dL (Ref range: mg/dL); HDL 35 mg/dL (L; Ref range: 40 - 60 mg/dL); LDL Calculated 43 mg/dL (Ref range: mg/dL).  Last A1C was 02/08/2023: Hemoglobin A1C 6.7 % (H; Ref range: %).  Last TFT was 02/08/2023: TSH 5.65 uIU/mL (H; Ref range: 0.27 - 4.20 uIU/mL), 11/29/2021: Free T4 1.1 ng/dL (Ref range: 0.9 - 1.7 ng/dL).    Jon Carson, LPN

## 2024-06-12 ENCOUNTER — Ambulatory Visit: Payer: Medicare (Managed Care) | Admitting: Acupuncturist

## 2024-06-12 ENCOUNTER — Other Ambulatory Visit: Payer: Self-pay

## 2024-06-12 DIAGNOSIS — M542 Cervicalgia: Secondary | ICD-10-CM

## 2024-06-12 NOTE — Progress Notes (Signed)
 ACUPUNCTURE  FOLLOW UP TREATMENT  NOTE       Patient: Jon [name]  MRN: [id number]  DOB: 04-09-1934    06/12/2024    SUBJECTIVE    Complaint Status  1.  Cervicalgia  Moderate    Changes since last treatment    Pt reports that his neck continues to improve with acupuncture. He notes that his pain is typically at its worst first thing in the morning and is particularly uncomfortable for the first 30 min or so of being awake. He admits that once he gets moving and has a hot cup of coffee his pain tends to decrease and the musculature in his neck and upper back begins to release and relax. He denies any other Sx or complaints today.     Today's Treatment Intention / Goal:  Alleviate chronic neck pain    TREATMENT    Acupuncture  Prone:    Bilateral:   Semispinalis Capitis MP  Levator Scapula MP  Scalene MP  Upper Trap MP   Middle Trap MP   Lower Trap MP     Electro   Ran 1 Hz continuous on all of the above MP. Placed the heat lamp over the neck and upper back for the duration of the tx. Applied Po Sum On oil to the neck and upper back post tx.      Treatment Number  6     Assessment:    Findings consistent with 89 y.o. male with chronic neck pain.  Prognosis:  Good   Contraindications/Precautions/Limitations:  Per protocol    Treatment Plan:   Options / plan reviewed with patient:  Yes  Treatment Freq / Recommendations 1 times per week for 1 month(s)    [x]  Todays visit included acupuncture with the use of electrical stimulation    Carlin DELENA Eland, LAC      Minutes   Initial Time Based CPT  Initial Acupuncture  Initial Acupuncture with Electrical Stimulation    15 min   Subsequent Time Based CPT  Subsequent Unit of Acupuncture  Subsequent Unit of Acupuncture with Electrical Stimulation       10 min   Non-Billable Time:  Retained Needle Time, Heat, Ice, Rest 10 min   Time spent with patient (face to face contact): 25 min         Total Treatment Time 35 min

## 2024-06-16 ENCOUNTER — Other Ambulatory Visit: Payer: Self-pay

## 2024-06-16 ENCOUNTER — Ambulatory Visit: Payer: Medicare (Managed Care) | Admitting: Acupuncturist

## 2024-06-16 DIAGNOSIS — M542 Cervicalgia: Secondary | ICD-10-CM

## 2024-06-16 NOTE — Progress Notes (Signed)
 ACUPUNCTURE  FOLLOW UP TREATMENT  NOTE       Patient: Jon [name]  MRN: [id number]  DOB: 11-Jun-1934    06/16/2024    SUBJECTIVE    Complaint Status  1.  Cervicalgia  Moderate    Changes since last treatment    Pt reports that his neck continues to improve with acupuncture. He notes that his pain seems to stem from his lower back and travel upwards to his neck. He feels that his lower back is very tight and definitely affects his neck and upper back. He denies any other Sx or complaints today.     Today's Treatment Intention / Goal:  Alleviate chronic neck pain    TREATMENT    Acupuncture  Prone:    Bilateral:   Semispinalis Capitis MP x 2  Levator Scapula MP  Scalene MP  Upper Trap MP   Lower Trap MP     Electro   Ran 1 Hz continuous on all of the above MP. Placed the heat lamp over the neck and upper back for the duration of the tx. Did stationary cupping on the entire back. Applied Po Sum On oil to the neck and upper back post tx.      Treatment Number  7     Assessment:    Findings consistent with 89 y.o. male with chronic neck pain.  Prognosis:  Good   Contraindications/Precautions/Limitations:  Per protocol    Treatment Plan:   Options / plan reviewed with patient:  Yes  Treatment Freq / Recommendations 1 times per week for 1 month(s)    [x]  Todays visit included acupuncture with the use of electrical stimulation    Carlin DELENA Eland, LAC      Minutes   Initial Time Based CPT  Initial Acupuncture  Initial Acupuncture with Electrical Stimulation    15 min   Subsequent Time Based CPT  Subsequent Unit of Acupuncture  Subsequent Unit of Acupuncture with Electrical Stimulation       10 min   Non-Billable Time:  Retained Needle Time, Heat, Ice, Rest 10 min   Time spent with patient (face to face contact): 25 min         Total Treatment Time 35 min

## 2024-06-26 ENCOUNTER — Ambulatory Visit: Payer: Medicare (Managed Care) | Admitting: Acupuncturist

## 2024-06-26 ENCOUNTER — Other Ambulatory Visit: Payer: Self-pay

## 2024-06-26 DIAGNOSIS — M542 Cervicalgia: Secondary | ICD-10-CM

## 2024-06-28 NOTE — Progress Notes (Signed)
 ACUPUNCTURE  FOLLOW UP TREATMENT  NOTE       Patient: Jon [name]  MRN: [id number]  DOB: Oct 31, 1934    06/28/2024    SUBJECTIVE    Complaint Status  1.  Cervicalgia  Moderate    Changes since last treatment    Pt reports that his neck continues to improve with acupuncture and that the cupping seemed to be beneficial last time. He felt as if he got a few more days of relief with the cupping and would like to try it again today. His pain is more pronounced first thing in the morning but once he has some hot coffee, takes a hot shower and starts moving around it improves. He denies any other Sx or complaints today.     Today's Treatment Intention / Goal:  Alleviate chronic neck pain    TREATMENT    Acupuncture  Prone:    Bilateral:   Semispinalis Capitis MP x 2  Splenius Capitis MP  Levator Scapula MP  Upper Trap MP   Middle Trap MP  Lower Trap MP     Electro   Ran 1 Hz continuous on all of the above MP. Placed the heat lamp over the neck and upper back for the duration of the tx. Did stationary cupping on the entire back. Applied Po Sum On oil to the neck and upper back post tx.      Treatment Number  8     Assessment:    Findings consistent with 89 y.o. male with chronic neck pain.  Prognosis:  Good   Contraindications/Precautions/Limitations:  Per protocol    Treatment Plan:   Options / plan reviewed with patient:  Yes  Treatment Freq / Recommendations 1 times per week for 1 month(s)    [x]  Todays visit included acupuncture with the use of electrical stimulation    Carlin DELENA Eland, LAC      Minutes   Initial Time Based CPT  Initial Acupuncture  Initial Acupuncture with Electrical Stimulation    15 min   Subsequent Time Based CPT  Subsequent Unit of Acupuncture  Subsequent Unit of Acupuncture with Electrical Stimulation       10 min   Non-Billable Time:  Retained Needle Time, Heat, Ice, Rest 10 min   Time spent with patient (face to face contact): 25 min         Total Treatment Time 35 min

## 2024-06-30 ENCOUNTER — Other Ambulatory Visit: Payer: Self-pay

## 2024-06-30 ENCOUNTER — Ambulatory Visit: Payer: Medicare (Managed Care) | Admitting: Acupuncturist

## 2024-06-30 DIAGNOSIS — M542 Cervicalgia: Secondary | ICD-10-CM

## 2024-06-30 NOTE — Progress Notes (Signed)
 ACUPUNCTURE  FOLLOW UP TREATMENT  NOTE       Patient: Jon [name]  MRN: [id number]  DOB: Jan 29, 1934    06/30/2024    SUBJECTIVE    Complaint Status  1.  Cervicalgia  Moderate    Changes since last treatment    Pt reports that his neck continues to improve with acupuncture and that the cupping seemed to be beneficial last time. He felt as if he got a few more days of relief with the cupping and would like to try it again today. His pain is more pronounced first thing in the morning but once he has some hot coffee, takes a hot shower and starts moving around it improves. He denies any other Sx or complaints today.     Today's Treatment Intention / Goal:  Alleviate chronic neck pain    TREATMENT    Acupuncture  Prone:    Bilateral:   Semispinalis Capitis MP x 2  Splenius Capitis MP  Levator Scapula MP  Upper Trap MP   Middle Trap MP  Lower Trap MP     Electro   Ran 1 Hz continuous on all of the above MP. Placed the heat lamp over the neck and upper back for the duration of the tx. Did stationary cupping on the entire back. Applied Po Sum On oil to the neck and upper back post tx.      Treatment Number  9     Assessment:    Findings consistent with 89 y.o. male with chronic neck pain.  Prognosis:  Good   Contraindications/Precautions/Limitations:  Per protocol    Treatment Plan:   Options / plan reviewed with patient:  Yes  Treatment Freq / Recommendations 1 times per week for 1 month(s)    [x]  Todays visit included acupuncture with the use of electrical stimulation    Jon [name], LAC      Minutes   Initial Time Based CPT  Initial Acupuncture  Initial Acupuncture with Electrical Stimulation    15 min   Subsequent Time Based CPT  Subsequent Unit of Acupuncture  Subsequent Unit of Acupuncture with Electrical Stimulation       10 min   Non-Billable Time:  Retained Needle Time, Heat, Ice, Rest 10 min   Time spent with patient (face to face contact): 25 min         Total Treatment Time 35 min

## 2024-07-17 ENCOUNTER — Ambulatory Visit: Payer: Medicare (Managed Care) | Admitting: Acupuncturist

## 2024-07-17 ENCOUNTER — Encounter: Payer: Self-pay | Admitting: Gastroenterology

## 2024-07-17 ENCOUNTER — Other Ambulatory Visit: Payer: Self-pay

## 2024-07-17 DIAGNOSIS — M542 Cervicalgia: Secondary | ICD-10-CM

## 2024-07-17 NOTE — Progress Notes (Signed)
 ACUPUNCTURE  FOLLOW UP TREATMENT  NOTE       Patient: Jon [name]  MRN: [id number]  DOB: [birth year]    07/17/2024    SUBJECTIVE    Complaint Status  1.  Cervicalgia  Moderate    Changes since last treatment    Pt reports that his neck continues to improve with acupuncture and that the cupping seemed to be beneficial last time. He felt as if he got a few more days of relief with the cupping and would like to try it again today. His pain is more pronounced first thing in the morning but once he has some hot coffee, takes a hot shower and starts moving around it improves. He denies any other Sx or complaints today.     Today's Treatment Intention / Goal:  Alleviate chronic neck pain    TREATMENT    Acupuncture  Prone:    Bilateral:   HTJJ C6-T4     Electro   Ran 1 Hz continuous on all of the above HTJJ. Placed the heat lamp over the neck and upper back for the duration of the tx. Did stationary cupping on the entire back. Applied Po Sum On oil to the neck and upper back post tx.      Treatment Number  10     Assessment:    Findings consistent with 89 y.o. male with chronic neck pain.  Prognosis:  Good   Contraindications/Precautions/Limitations:  Per protocol    Treatment Plan:   Options / plan reviewed with patient:  Yes  Treatment Freq / Recommendations 1 times per week for 1 month(s)    [x]  Todays visit included acupuncture with the use of electrical stimulation    Carlin DELENA Eland, LAC      Minutes   Initial Time Based CPT  Initial Acupuncture  Initial Acupuncture with Electrical Stimulation    15 min   Subsequent Time Based CPT  Subsequent Unit of Acupuncture  Subsequent Unit of Acupuncture with Electrical Stimulation       10 min   Non-Billable Time:  Retained Needle Time, Heat, Ice, Rest 10 min   Time spent with patient (face to face contact): 25 min         Total Treatment Time 35 min

## 2024-07-21 ENCOUNTER — Other Ambulatory Visit: Payer: Self-pay | Admitting: Primary Care

## 2024-07-21 DIAGNOSIS — E1169 Type 2 diabetes mellitus with other specified complication: Secondary | ICD-10-CM

## 2024-07-21 NOTE — Telephone Encounter (Signed)
 [facility] Refill Note08/05/25[name]  Oct 03, 1934  MRN: [name] office visit: 3/4/2025Last telehome visit: Visit date not foundLast APP visit:  Visit date not foundRecent VisitsDate Type Provider Dept 02/18/24 Office Visit [name], MD [facility] Showing recent visits within past 185 days in an active department and meeting all other requirementsFuture AppointmentsNo visits were found meeting these conditions.Showing future appointments within next 0 days in an active department and meeting all other requirementsOpioid Drug Screen:No results for input(s): AMPU, BEU, OPSU, OXYU, THCU, BZDU, COPS, CTHC, UCRNC, TRAMD in the last 8760 hours.LABORATORY DATA:Last Renal Func was  02/08/2023: Creatinine 1.40 mg/dL (H; Ref range: 9.32 - 8.82 mg/dL); eGFR BY CREAT 49 * (!; Ref range: *); Potassium 4.9 mmol/L (Ref range: 3.3 - 5.1 mmol/L). Last LFTs was  11/29/2021: ALT 18 U/L (Ref range: 0 - 50 U/L); AST 20 U/L (Ref range: 0 - 50 U/L). Last Lipid panel was 11/29/2021: Cholesterol 109 mg/dL (Ref range: mg/dL); HDL 35 mg/dL (L; Ref range: 40 - 60 mg/dL); LDL Calculated 43 mg/dL (Ref range: mg/dL).Last A1C was 02/08/2023: Hemoglobin A1C 6.7 % (H; Ref range: %).Last TFT was 02/08/2023: TSH 5.65 uIU/mL (H; Ref range: 0.27 - 4.20 uIU/mL), 11/29/2021: Free T4 1.1 ng/dL (Ref range: 0.9 - 1.7 ng/dL).Andrea SAUNDERS Lever, LPN

## 2024-07-24 ENCOUNTER — Ambulatory Visit: Payer: Medicare (Managed Care) | Admitting: Acupuncturist

## 2024-07-24 ENCOUNTER — Other Ambulatory Visit: Payer: Self-pay

## 2024-07-24 DIAGNOSIS — M545 Low back pain, unspecified: Secondary | ICD-10-CM

## 2024-07-24 DIAGNOSIS — G8929 Other chronic pain: Secondary | ICD-10-CM

## 2024-07-24 LAB — UNMAPPED LAB RESULTS
Hematocrit (HT): 39 % — ABNORMAL LOW (ref 40–52)
Hemoglobin (HGB) (HT): 13 g/dL (ref 13.0–17.5)
MCHC (HT): 33.1 g/dL (ref 32.0–36.0)
MCV (HT): 98.7 fL (ref 81.0–99.0)
Mean Corpuscular Hemoglobin (MCH) (HT): 32.7 pg (ref 26.0–34.0)
Platelets (HT): 176 10 3/uL (ref 150–450)
RBC (HT): 3.98 10 6/uL — ABNORMAL LOW (ref 4.20–5.90)
RDW (HT): 11.8 % (ref 11.5–15.0)
WBC (HT): 6.3 10 3/uL (ref 4.0–10.8)

## 2024-07-24 NOTE — Progress Notes (Signed)
 ACUPUNCTURE  FOLLOW UP TREATMENT  NOTE Patient: Jon Vold HaefnerMRN: [id number]: 03-04-19378/8/2025SUBJECTIVEComplaint Status1.  Chronic low back pain  ModerateChanges since last treatmentPt c/o chronic midline lumbosacral pain that does not radiate into his buttocks or BLE. He feels as if his lower back is largely responsible for his upper back and neck pain and tension. He admits that heat helps and always alleviates the pain and tension. He denies any b/b incontinence. He denies any other Sx or complaints today. Today's Treatment Intention / Goal:Alleviate chronic low back painTREATMENTAcupunctureProne:Bilateral:HTJJ L2-S1Levator Scapula MPUpper Trap MP Lower Trap MP Electro Ran 2 Hz continuous on all of the above HTJJ and Upper Trap MP. Placed the heat lamp over the low back for the duration of the tx. Did stationary cupping on the back. Applied Po Sum On oil to the neck and back post tx.  Treatment Number11 Assessment:  Findings consistent with 89 y.o. male with chronic low back pain.Prognosis:  Good Contraindications/Precautions/Limitations:  Per protocolTreatment Plan: Options / plan reviewed with patient:  YesTreatment Freq / Recommendations 1 times per week [x]  Todays visit included acupuncture with the use of electrical stimulationCharles A Leatrice, LAC  Minutes Initial Time Based CPTInitial AcupunctureInitial Acupuncture with Electrical Stimulation  15 min Subsequent Time Based CPTSubsequent Unit of AcupunctureSubsequent Unit of Acupuncture with Electrical Stimulation   10 min Non-Billable Time:Retained Needle Time, Heat, Ice, Rest 10 min Time spent with patient (face to face contact): 25 min     Total Treatment Time 35 min

## 2024-07-28 ENCOUNTER — Other Ambulatory Visit: Payer: Self-pay

## 2024-07-28 ENCOUNTER — Ambulatory Visit: Payer: Medicare (Managed Care) | Admitting: Acupuncturist

## 2024-07-28 DIAGNOSIS — G8929 Other chronic pain: Secondary | ICD-10-CM

## 2024-07-28 DIAGNOSIS — M545 Low back pain, unspecified: Secondary | ICD-10-CM

## 2024-07-28 NOTE — Progress Notes (Signed)
 ACUPUNCTURE  FOLLOW UP TREATMENT  NOTE Patient: Jon Fritsch HaefnerMRN: [id number]: 03-27-378/12/2025SUBJECTIVEComplaint Status1.  Chronic low back pain  ModerateChanges since last treatmentPt reports that he got ~4-5 days worth of relief from his last visit. He states that I hit all the right spots last time and that he was very pleased with the results of his treatment. He continues to endorse chronic b/l midline and paramidline LS pain, as well as chronic neck and upper back pain/tension. He denies any numbness, tingling, weakness or b/b incontinence. He denies any other Sx or complaints today. Today's Treatment Intention / Goal:Alleviate chronic low back painTREATMENTAcupunctureProne:Bilateral:HTJJ L2-S1Levator Scapula MPUpper Trap MP Lower Trap MP Electro Ran 2 Hz continuous on all of the above HTJJ and Upper Trap MP. Placed the heat lamp over the low back for the duration of the tx. Applied Po Sum On oil to the neck and back post tx.  Treatment Number12 Assessment:  Findings consistent with 89 y.o. male with chronic low back pain.Prognosis:  Good Contraindications/Precautions/Limitations:  Per protocolTreatment Plan: Options / plan reviewed with patient:  YesTreatment Freq / Recommendations 1 times per week [x]  Todays visit included acupuncture with the use of electrical stimulationCharles A Leatrice, LAC  Minutes Initial Time Based CPTInitial AcupunctureInitial Acupuncture with Electrical Stimulation  15 min Subsequent Time Based CPTSubsequent Unit of AcupunctureSubsequent Unit of Acupuncture with Electrical Stimulation   10 min Non-Billable Time:Retained Needle Time, Heat, Ice, Rest 10 min Time spent with patient (face to face contact): 25 min     Total Treatment Time 35 min

## 2024-08-04 ENCOUNTER — Ambulatory Visit: Payer: Medicare (Managed Care) | Admitting: Acupuncturist

## 2024-08-05 ENCOUNTER — Other Ambulatory Visit: Payer: Self-pay | Admitting: Primary Care

## 2024-08-05 DIAGNOSIS — E1169 Type 2 diabetes mellitus with other specified complication: Secondary | ICD-10-CM

## 2024-08-06 NOTE — Telephone Encounter (Signed)
 [facility] Refill Note08/21/25[name]  02-15-1934  MRN: [name] office visit: 3/4/2025Last telehome visit: Visit date not foundLast APP visit:  Visit date not foundRecent VisitsDate Type Provider Dept 02/18/24 Office Visit [name], MD [facility] Showing recent visits within past 185 days in an active department and meeting all other requirementsFuture AppointmentsNo visits were found meeting these conditions.Showing future appointments within next 0 days in an active department and meeting all other requirementsOpioid Drug Screen:No results for input(s): AMPU, BEU, OPSU, OXYU, THCU, BZDU, COPS, CTHC, UCRNC, TRAMD in the last 8760 hours.LABORATORY DATA:Last Renal Func was  02/08/2023: Creatinine 1.40 mg/dL (H; Ref range: 9.32 - 8.82 mg/dL); eGFR BY CREAT 49 * (!; Ref range: *); Potassium 4.9 mmol/L (Ref range: 3.3 - 5.1 mmol/L). Last LFTs was  11/29/2021: ALT 18 U/L (Ref range: 0 - 50 U/L); AST 20 U/L (Ref range: 0 - 50 U/L). Last Lipid panel was 11/29/2021: Cholesterol 109 mg/dL (Ref range: mg/dL); HDL 35 mg/dL (L; Ref range: 40 - 60 mg/dL); LDL Calculated 43 mg/dL (Ref range: mg/dL).Last A1C was 02/08/2023: Hemoglobin A1C 6.7 % (H; Ref range: %).Last TFT was 02/08/2023: TSH 5.65 uIU/mL (H; Ref range: 0.27 - 4.20 uIU/mL), 11/29/2021: Free T4 1.1 ng/dL (Ref range: 0.9 - 1.7 ng/dL).[name]

## 2024-08-09 ENCOUNTER — Other Ambulatory Visit: Payer: Self-pay | Admitting: Primary Care

## 2024-08-09 DIAGNOSIS — E7800 Pure hypercholesterolemia, unspecified: Secondary | ICD-10-CM

## 2024-08-09 DIAGNOSIS — E1169 Type 2 diabetes mellitus with other specified complication: Secondary | ICD-10-CM

## 2024-08-10 NOTE — Telephone Encounter (Signed)
 [facility] Refill Note08/25/25[name]  03-25-1934  MRN: [name] office visit: 3/4/2025Last telehome visit: Visit date not foundLast APP visit:  Visit date not foundRecent VisitsDate Type Provider Dept 02/18/24 Office Visit [name], MD [facility] Showing recent visits within past 185 days in an active department and meeting all other requirementsFuture AppointmentsNo visits were found meeting these conditions.Showing future appointments within next 0 days in an active department and meeting all other requirementsOpioid Drug Screen:No results for input(s): AMPU, BEU, OPSU, OXYU, THCU, BZDU, COPS, CTHC, UCRNC, TRAMD in the last 8760 hours.LABORATORY DATA:Last Renal Func was  02/08/2023: Creatinine 1.40 mg/dL (H; Ref range: 9.32 - 8.82 mg/dL); eGFR BY CREAT 49 * (!; Ref range: *); Potassium 4.9 mmol/L (Ref range: 3.3 - 5.1 mmol/L). Last LFTs was  11/29/2021: ALT 18 U/L (Ref range: 0 - 50 U/L); AST 20 U/L (Ref range: 0 - 50 U/L). Last Lipid panel was 11/29/2021: Cholesterol 109 mg/dL (Ref range: mg/dL); HDL 35 mg/dL (L; Ref range: 40 - 60 mg/dL); LDL Calculated 43 mg/dL (Ref range: mg/dL).Last A1C was 02/08/2023: Hemoglobin A1C 6.7 % (H; Ref range: %).Last TFT was 02/08/2023: TSH 5.65 uIU/mL (H; Ref range: 0.27 - 4.20 uIU/mL), 11/29/2021: Free T4 1.1 ng/dL (Ref range: 0.9 - 1.7 ng/dL).[name]

## 2024-08-11 ENCOUNTER — Ambulatory Visit: Payer: Medicare (Managed Care) | Admitting: Acupuncturist

## 2024-08-17 ENCOUNTER — Other Ambulatory Visit: Payer: Self-pay | Admitting: Family Medicine

## 2024-08-17 DIAGNOSIS — E1169 Type 2 diabetes mellitus with other specified complication: Secondary | ICD-10-CM

## 2024-08-18 NOTE — Telephone Encounter (Signed)
 [facility] Refill Note09/02/25[name]  1934-02-05  MRN: [name] office visit: 3/4/2025Last telehome visit: Visit date not foundLast APP visit:  Visit date not foundRecent VisitsDate Type Provider Dept 02/18/24 Office Visit [name], MD [facility] Showing recent visits within past 185 days in an active department and meeting all other requirementsFuture AppointmentsNo visits were found meeting these conditions.Showing future appointments within next 0 days in an active department and meeting all other requirementsOpioid Drug Screen:No results for input(s): AMPU, BEU, OPSU, OXYU, THCU, BZDU, COPS, CTHC, UCRNC, TRAMD in the last 8760 hours.LABORATORY DATA:Last Renal Func was  02/08/2023: Creatinine 1.40 mg/dL (H; Ref range: 9.32 - 8.82 mg/dL); eGFR BY CREAT 49 * (!; Ref range: *); Potassium 4.9 mmol/L (Ref range: 3.3 - 5.1 mmol/L). Last LFTs was  11/29/2021: ALT 18 U/L (Ref range: 0 - 50 U/L); AST 20 U/L (Ref range: 0 - 50 U/L). Last Lipid panel was 11/29/2021: Cholesterol 109 mg/dL (Ref range: mg/dL); HDL 35 mg/dL (L; Ref range: 40 - 60 mg/dL); LDL Calculated 43 mg/dL (Ref range: mg/dL).Last A1C was 02/08/2023: Hemoglobin A1C 6.7 % (H; Ref range: %).Last TFT was 02/08/2023: TSH 5.65 uIU/mL (H; Ref range: 0.27 - 4.20 uIU/mL), 11/29/2021: Free T4 1.1 ng/dL (Ref range: 0.9 - 1.7 ng/dL).Safa Derner

## 2024-08-21 ENCOUNTER — Ambulatory Visit: Payer: Medicare (Managed Care) | Admitting: Acupuncturist

## 2024-08-21 ENCOUNTER — Other Ambulatory Visit: Payer: Self-pay

## 2024-08-21 DIAGNOSIS — G8929 Other chronic pain: Secondary | ICD-10-CM

## 2024-08-21 DIAGNOSIS — M545 Low back pain, unspecified: Secondary | ICD-10-CM

## 2024-08-22 NOTE — Progress Notes (Signed)
 ACUPUNCTURE  FOLLOW UP TREATMENT  NOTE Patient: Jon Balling HaefnerMRN: [id number]: 05-30-19379/6/2025SUBJECTIVEComplaint Status1.  Chronic low back pain  ModerateChanges since last treatmentPt was last seen for acupuncture on 08/11/24 for similar complaint. He notes that two weeks was too long to not have a treatment as he has not been doing well and has had a rough week thus far. He continues to endorse chronic b/l midline and paramidline LS pain, as well as chronic neck and upper back pain/tension. In addition to acupuncture, he is a firm believer in cupping and feels as if it adds that extra layer of effectiveness to his treatments. He denies any numbness, tingling, weakness or b/b incontinence. He denies any other Sx or complaints today. Today's Treatment Intention / Goal:Alleviate chronic low back painTREATMENTAcupunctureProne:Bilateral:HTJJ L2-S1Levator Scapula MPUpper Trap MP Lower Trap MP Electro Ran 2 Hz continuous on all of the above HTJJ and Upper Trap MP. Placed the heat lamp over the low back for the duration of the tx. Applied Po Sum On oil to the neck and back post tx.  Treatment Number13 Assessment:  Findings consistent with 89 y.o. male with chronic low back pain.Prognosis:  Good Contraindications/Precautions/Limitations:  Per protocolTreatment Plan: Options / plan reviewed with patient:  YesTreatment Freq / Recommendations 1 times per week [x]  Todays visit included acupuncture with the use of electrical stimulationCharles A Leatrice, LAC  Minutes Initial Time Based CPTInitial AcupunctureInitial Acupuncture with Electrical Stimulation  15 min Subsequent Time Based CPTSubsequent Unit of AcupunctureSubsequent Unit of Acupuncture with Electrical Stimulation   10 min Non-Billable Time:Retained Needle Time, Heat, Ice, Rest 10 min Time spent with patient (face to face  contact): 25 min     Total Treatment Time 35 min

## 2024-08-25 ENCOUNTER — Ambulatory Visit: Payer: Medicare (Managed Care) | Admitting: Primary Care

## 2024-08-25 ENCOUNTER — Other Ambulatory Visit: Payer: Self-pay

## 2024-08-25 ENCOUNTER — Ambulatory Visit: Payer: Medicare (Managed Care) | Admitting: Family Medicine

## 2024-08-25 NOTE — Progress Notes (Deleted)
 09/09/25Patient: Jon [name] DOB: [birth year] MRN: [id number] DOS: 08/25/24 RE: [facility] OUTPATIENT PROGRESS NOTESubjective I had the pleasure of seeing Jon [name] for follow up today.In brief, he is a 89 y.o. male presenting for evaluation of No chief complaint on file.History of Present IllnessReview of symptoms is as above.Current Outpatient Medications Medication Instructions  acetaminophen  (TYLENOL ) 500 mg, PRN  amoxicillin  (AMOXIL ) 500 mg, Oral, 2 TIMES DAILY  Blood Glucose Calibration (OT ULTRA/FASTTK CNTRL SOLN) SOLN As directed  blood glucose monitor (FREESTYLE LITE) kit TEST AS DIRECTED  blood glucose monitor w/Device KIT Test as directed  cholecalciferol (VITAMIN D -1000 MAX ST) 1,000 units, DAILY  CINNAMON PO Take by mouth  Cinnamon 500 mg  Coenzyme Q10 200 mg, DAILY  dofetilide (TIKOSYN) 250 MCG capsule 1 capsule, 2 TIMES DAILY  Eliquis  5 mg, Oral, EVERY 12 HOURS  empagliflozin  (JARDIANCE ) 10 mg, Oral, EVERY MORNING  finasteride  (PROSCAR ) 5 mg tablet TAKE 1 TABLET BY MOUTH EVERY DAY. FOR USE BY MEN ONLY  Fish Oil 1 g, DAILY  generic DME Dispense 1 single point cane. Use as directed.  insulin  pen needle (BD PEN NEEDLE NANO 2ND GEN) 32G X 4 MM Use 1 time per week.  lancets (ONETOUCH ULTRASOFT) Use   2  times per day as instructed for blood glucose testing.  Lancets Micro Thin 33G MISC Use as directed once daily. Dispense lancets for one touch verio  memantine  (NAMENDA ) 10 mg, Oral, 2 TIMES DAILY  metFORMIN  (GLUCOPHAGE ) 500 mg, Oral, 2 TIMES DAILY WITH MEALS  metoprolol  succinate ER (TOPROL -XL) 25 mg, Oral, EVERY 12 HOURS  Multiple Vitamins-Minerals (MULTI VITAMIN/MINERALS PO) Take by mouth  OZEMPIC , 0.25 OR 0.5 MG/DOSE, pen INJECT 0.5MG  SUBCUTANEOUSLY (UNDER THE SKIN) ONCE WEEKLY  pioglitazone  (ACTOS ) 15 mg, Oral, DAILY  rosuvastatin  (CRESTOR ) 10 mg, Oral, DAILY WITH DINNER   spironolactone (ALDACTONE) 25 mg, DAILY  I have reviewed the patient's past medical, surgical, family and medication histories and made appropriate corrections and updates in their respective parts of this chart.Objective Today, vital signs are as follows: There were no vitals taken for this visit.. On exam, he is alert, oriented and well appearing, in NAD. he appears warm and well perfused with non-labored breathing. his Skin is warm and dry without visible rashes on exposed skinFoot Exam:  Right:  No edema, 2 + Pedal pulse, Normal Microfilament testing using recommended testing point, No lesions on plantar or dorsal surfaces.  Left: No edema, 2 + Pedal pulse, Normal Microfilament testing using recommended testing point, No lesions on plantar or dorsal surfaces. Assessment & Plan Jon [name] is a 89 y.o. male presenting for follow up.1. Type 2 diabetes mellitus with other specified complication, without long-term current use of insulin  (Primary)Chronic issue. Last A1C was 02/08/2023: Hemoglobin A1C 6.7 % (H; Ref range: %). ADA guidelines the patient A1C goal is: less than 8.- ***- ASCVD risk score: No ASCVD Score for Patient., currently on statin therapy- Discussed patient's daily dietary preferences and suggested modifications including limiting sugar and carbs in diet, reading food labels, eating whole grains, incorporating fruits and vegetables.- Recommended finding creative ways to increase physical activity, motivation to find enjoyable forms of exercise per patient interests - Labs: A1c +CMP/BMP q3 month, Lipid Panel q3-6 months  [x]  urine microalb q 6 months - Vaccines: Pneumococcal vaccine [x]  completed []  declined  Influenza []  completed [x]  Not in season []  declined - Diabetic foot exam & Diabetic retinopathy screening yearly. These are UTD. Reviewed with patient.Current  Diabetes Medications        OZEMPIC , 0.25 OR 0.5 MG/DOSE, pen INJECT  0.5MG  SUBCUTANEOUSLY (UNDER THE SKIN) ONCE WEEKLY  pioglitazone  (ACTOS ) 15 mg tablet TAKE 1 TABLET BY MOUTH EVERY DAY  metFORMIN  (GLUCOPHAGE ) 500 mg tablet TAKE 1 TABLET BY MOUTH TWO TIMES DAILY WITH MEALS  empagliflozin  (JARDIANCE ) 10 mg tablet Take 1 tablet (10 mg total) by mouth every morning   F/U: No follow-ups on file.Joesph Hill, FNP-CFamily MedicineUniversity of Vander[facility] NetworkMedical Associates of Little [place]B: [phone] F: [phone]/09/25 9:10 AMTo my patients:  Note was dictated using voice-recognition program which may result in minor transcription errors.

## 2024-08-26 ENCOUNTER — Ambulatory Visit: Payer: Medicare (Managed Care) | Admitting: Acupuncturist

## 2024-08-26 DIAGNOSIS — G8929 Other chronic pain: Secondary | ICD-10-CM

## 2024-08-26 DIAGNOSIS — M545 Low back pain, unspecified: Secondary | ICD-10-CM

## 2024-08-26 NOTE — Progress Notes (Signed)
 ACUPUNCTURE  FOLLOW UP TREATMENT  NOTE Patient: Jon Peter HaefnerMRN: [id number]: 14-Sep-19379/10/2025SUBJECTIVEComplaint Status1.  Chronic low back pain  ModerateChanges since last treatmentPt continues to endorse chronic b/l midline and paramidline LS pain, as well as his usual chronic neck and upper back pain/tension. He denies any numbness, tingling, weakness or b/b incontinence. He denies any other Sx or complaints today. Today's Treatment Intention / Goal:Alleviate chronic low back painTREATMENTAcupunctureProne:Bilateral:HTJJ L2-S1Semispinalis Capitis MPUpper Trap MP Lower Trap MP Electro Ran 2 Hz continuous on all of the above points. Placed the heat lamp over the low back for the duration of the tx. Did stationary cupping on the back. Applied Po Sum On oil to the neck and back post tx.  Treatment Number14 Assessment:  Findings consistent with 89 y.o. male with chronic low back pain.Prognosis:  Good Contraindications/Precautions/Limitations:  Per protocolTreatment Plan: Options / plan reviewed with patient:  YesTreatment Freq / Recommendations 1 times per week [x]  Todays visit included acupuncture with the use of electrical stimulationCharles A Leatrice, LAC  Minutes Initial Time Based CPTInitial AcupunctureInitial Acupuncture with Electrical Stimulation  15 min Subsequent Time Based CPTSubsequent Unit of AcupunctureSubsequent Unit of Acupuncture with Electrical Stimulation   10 min Non-Billable Time:Retained Needle Time, Heat, Ice, Rest 10 min Time spent with patient (face to face contact): 25 min     Total Treatment Time 35 min

## 2024-08-27 ENCOUNTER — Ambulatory Visit: Payer: Medicare (Managed Care) | Attending: Family Medicine | Admitting: Family Medicine

## 2024-08-27 ENCOUNTER — Other Ambulatory Visit: Payer: Self-pay

## 2024-08-27 ENCOUNTER — Encounter: Payer: Self-pay | Admitting: Family Medicine

## 2024-08-27 VITALS — BP 98/62 | HR 81 | Resp 18 | Wt 179.0 lb

## 2024-08-27 DIAGNOSIS — M25561 Pain in right knee: Secondary | ICD-10-CM

## 2024-08-27 DIAGNOSIS — E1169 Type 2 diabetes mellitus with other specified complication: Secondary | ICD-10-CM

## 2024-08-27 DIAGNOSIS — R42 Dizziness and giddiness: Secondary | ICD-10-CM

## 2024-08-27 DIAGNOSIS — M47812 Spondylosis without myelopathy or radiculopathy, cervical region: Secondary | ICD-10-CM

## 2024-08-27 DIAGNOSIS — G8929 Other chronic pain: Secondary | ICD-10-CM

## 2024-08-27 DIAGNOSIS — M545 Low back pain, unspecified: Secondary | ICD-10-CM

## 2024-08-27 NOTE — Progress Notes (Addendum)
 09/11/25Patient: Jon [name] DOB: 1934/12/12 MRN: [id number] DOS: 08/27/24 RE: [facility] OUTPATIENT PROGRESS NOTESubjective I had the pleasure of seeing Jon [name] for follow up today.In brief, he is a 89 y.o. male presenting for evaluation of Diabetes.History of Present IllnessThe patient is here for a 67-month diabetes follow-up, dizziness, neck and back pain, and knee pain. He is accompanied by his daughter.He has not been monitoring his blood sugar levels at home recently but reports that they have remained stable. His current medication regimen includes Ozempic  once a week, metformin  twice daily with meals, Jardiance , and Actos . He is tolerating these medications well without adverse effects.In the morning, he experiences significant neck pain, which is accompanied by dizziness. His blood pressure tends to be on the lower side at baseline. The dizziness is intermittent and not constant. He has not started any new medications recently and reports no falls or head injuries. He is currently taking Midodrine 5 mg three times a day, as prescribed by his cardiologist, but did not take his 2:00 PM dose yet today. He is cautious in his movements due to the dizziness. He reports no chest pain but does experience heavy breathing early in the morning, which resolves quickly. He does not have a blood pressure monitor at home and does not check his blood pressure when he feels dizzy. He had a consultation with his cardiologist last Thursday or Friday, who found everything to be normal. He believes he needs to increase his water  intake. He is also on Metoprolol  for atrial fibrillation.His primary concern is the pain in his neck and back, which has been diagnosed as arthritis. He also reports weakness in his legs and knee pain. He has midline low back pain and upper back and neck pain. He finds it difficult to stand up in the morning but notes that a sip of  hot coffee provides relief. Acupuncture also seems to alleviate his symptoms more quickly. He is not currently undergoing physical therapy. He takes Tylenol , two tablets in the morning and two at night.He has undergone partial replacement surgery on one side of his knee and has had blood clots in each leg. He does not wish to undergo a second operation. He is not currently under the care of an orthopedic specialist. He experiences sharp pain in his right knee upon standing up in the morning, which subsides with walking.Coffee/Tea/Caffeine-containing Drinks: Drinks coffee in the morningPAST SURGICAL HISTORY:Partial knee replacement on one sideReview of symptoms is as above.Current Outpatient Medications Medication Instructions  acetaminophen  (TYLENOL ) 500 mg, PRN  amoxicillin  (AMOXIL ) 500 mg, Oral, 2 TIMES DAILY  Blood Glucose Calibration (OT ULTRA/FASTTK CNTRL SOLN) SOLN As directed  blood glucose monitor (FREESTYLE LITE) kit TEST AS DIRECTED  blood glucose monitor w/Device KIT Test as directed  cholecalciferol (VITAMIN D -1000 MAX ST) 1,000 units, DAILY  CINNAMON PO Take by mouth  Cinnamon 500 mg  Coenzyme Q10 200 mg, DAILY  dofetilide (TIKOSYN) 250 MCG capsule 1 capsule, 2 TIMES DAILY  Eliquis  5 mg, Oral, EVERY 12 HOURS  empagliflozin  (JARDIANCE ) 10 mg, Oral, EVERY MORNING  finasteride  (PROSCAR ) 5 mg tablet TAKE 1 TABLET BY MOUTH EVERY DAY. FOR USE BY MEN ONLY  Fish Oil 1 g, DAILY  generic DME Dispense 1 single point cane. Use as directed.  insulin  pen needle (BD PEN NEEDLE NANO 2ND GEN) 32G X 4 MM Use 1 time per week.  lancets (ONETOUCH ULTRASOFT) Use   2  times per day as instructed for blood glucose  testing.  Lancets Micro Thin 33G MISC Use as directed once daily. Dispense lancets for one touch verio  memantine  (NAMENDA ) 10 mg, Oral, 2 TIMES DAILY  metFORMIN  (GLUCOPHAGE ) 500 mg, Oral, 2 TIMES DAILY WITH MEALS  metoprolol  succinate ER (TOPROL -XL)  25 mg, Oral, EVERY 12 HOURS  midodrine (PROAMATINE) 5 mg, 3 TIMES DAILY  Multiple Vitamins-Minerals (MULTI VITAMIN/MINERALS PO) Take by mouth  OZEMPIC , 0.25 OR 0.5 MG/DOSE, pen INJECT 0.5MG  SUBCUTANEOUSLY (UNDER THE SKIN) ONCE WEEKLY  pioglitazone  (ACTOS ) 15 mg, Oral, DAILY  rosuvastatin  (CRESTOR ) 10 mg, Oral, DAILY WITH DINNER  spironolactone (ALDACTONE) 25 mg, DAILY  I have reviewed the patient's past medical, surgical, family and medication histories and made appropriate corrections and updates in their respective parts of this chart.Objective Today, vital signs are as follows: Blood pressure 98/62, pulse 81, resp. rate 18, weight 81.2 kg (179 lb).BP Readings from Last 3 Encounters: 08/27/24 98/62 04/28/24 (P) 96/55 03/27/24 99/67 Physical ExamVitals reviewed. Constitutional:     General: He is not in acute distress.   Appearance: Normal appearance. He is not ill-appearing. Cardiovascular:    Rate and Rhythm: Normal rate. Pulmonary:    Effort: Pulmonary effort is normal. Musculoskeletal:    Cervical back: Normal range of motion and neck supple. Skin:   General: Skin is warm and dry. Neurological:    Gait: Gait normal. Results:Lab Results Component Value Date  HA1C 6.7 (H) 02/08/2023 Lab Results Component Value Date  MALBR 3.80 02/08/2023 MR cervical spine without contrast (04/14/24): Cervical spondylosis with degenerative disc and facet disease, described in detail above. There are minimal, grade 1, spondylolistheses of C5 over C6, C6 over C7 and C7 over T1. At C2-C3, there is minimal left neural foraminal narrowing. At C3-C4, there is mild spinal stenosis as well as moderate bilateral neural foraminal stenosis. At C4-C5, there is mild to moderate spinal stenosis as well as moderate to severe bilateral neural foraminal stenosis. At C5-C6, there is mild spinal stenosis and minimal ventral cord deformity as well as moderate  left-sided neural foraminal stenosis. At C7-T1, there is a posterior disc annular tear. Incidental note is made of patchy T2 hyperintensity within the pons which is nonspecific in appearance but most likely secondary to ischemic gliosis in a patient this age. Incidental note is made of right maxillary sinus mucosal thickening.XR cervical spine (03/27/24): Cervical vertebral bodies maintain height and alignment.Multilevel intervertebral disc height loss associated with degenerative change characterized by endplate sclerosis and marginal productive osteophytes. Assessment & Plan Jon [name] is a 89 y.o. male presenting for follow up.1. Type 2 diabetes mellitus with other specified complication, without long-term current use of insulin  (Primary)Last A1C was 02/08/2023: Hemoglobin A1C 6.7 % (H; Ref range: %). ADA guidelines the patient A1C goal is: less than 8. Per our records the patient is UTD on diabetic eye exam. Patient's diabetic foot exam is overdue, and exam deferred. Patient is prescribed statin to reduce risk of cardiovascular events. - Chronic issue, stable, at goal- Continue Ozempic  0.5 mg weekly, Metformin  500 mg BID, Jardiance  10 mg daily, and Actos  15 mg daily.- Will check updated A1c, CMP, and fasting lipid panel to assess current glycemic control and for any signs of diabetic complication.- Discussed patient's daily dietary preferences and suggested modifications including limiting sugar and carbs in diet, reading food labels, eating whole grains, incorporating fruits and vegetables.- Recommended finding creative ways to increase physical activity, motivation to find enjoyable forms of exercise per patient interests - Labs: A1c +CMP/BMP q3 month, Lipid Panel  q3-6 months  [x]  urine microalb: UTD- Vaccines: Pneumococcal vaccine [x]  completed []  declined  Influenza []  completed []  Not in season [x]  declined Current Diabetes Medications   Incretin Mimetic Agents      OZEMPIC , 0.25 OR 0.5 MG/DOSE, pen INJECT 0.5MG  SUBCUTANEOUSLY (UNDER THE SKIN) ONCE WEEKLY   Biguanides     metFORMIN  (GLUCOPHAGE ) 500 mg tablet TAKE 1 TABLET BY MOUTH TWO TIMES DAILY WITH MEALS   Sodium-Glucose Co-Transporter 2 (SGLT2) Inhibitors     empagliflozin  (JARDIANCE ) 10 mg tablet Take 1 tablet (10 mg total) by mouth every morning   Insulin  Sensitizing Agents     pioglitazone  (ACTOS ) 15 mg tablet TAKE 1 TABLET BY MOUTH EVERY DAY   - Hemoglobin A1c; Future- Comprehensive metabolic panel; Future- Lipid Panel (Reflex to Direct  LDL if Triglycerides more than 400); Future2. Cervical spondylosis without myelopathy3. Chronic low back painChronic without exacerbation. Reassuring history of present illness and physical exam. - Chronic issue, suboptimal control- Reviewed recent MRI of cervical spine obtained 04/14/2024 per PMR with signs of degenerative disc and facet disease. I did encourage him to follow-up with Dr. Malena office regarding his imaging and management of symptoms as it appears they wanted to see him back following his MRI. I encouraged patient and his daughter to schedule a follow up with PMR as it does appear several visits were cancelled earlier this spring.-  Activity as tolerated.- Continue current acupuncture as scheduled. Continue home exercise program. - For pain relief can use RICE therapy and OTC Tylenol  on a short term basis.- Encouraged maintaining a healthy weight/appropriate weight loss measures.- Encouraged an appropriate aerobic exercise program.- If no improvement, physical therapy referral for strengthening and stabilization.4. Chronic pain of right kneeChronic with exacerbation. Reassuring history of present illness and physical exam. - Suspect OA especially with hx of partial left knee replacement in past. He declines obtaining imaging today to confirm this. Orthopedic referral placed for further evaluation and  consideration of steroid injection. He is not interested in surgical management at this time if indicated.-  Activity as tolerated.- For pain relief can use RICE therapy and OTC Tylenol  on a short term basis.- Encouraged an appropriate aerobic exercise program.- If no improvement, physical therapy referral for strengthening and stabilization.- Bracing - Not indicated- AMB REFERRAL [facility]. Dizziness- Chronic issue, exam today reassuring without current symptoms or concerning findings- Symptoms appear to be more related to orthostasis which is an ongoing concern - Follows with cardiology (Dr. Jama) who stopped patient's Entresto  due to soft BPs- Encouraged maintaining adequate hydration status as he does report this is an area he tends to struggle with.- Continue Midodrine 5 mg TID as prescribed. May benefit from increased Midodrine dosage however there does run risk for potential bradycardia especially in setting of beta blocker use. I did encourage him to reach out to his cardiologist who manages this medication to further discuss his symptoms and whether dose adjustment warranted.- Recommend utilizing home blood pressure monitor routinely, would ideally be beneficial to check reading during episode of acute dizziness to see if hypotension related.- Should rise slowly from a seated or lying position to minimize risk for dizziness or falls.- Advised follow up if symptoms not improving and/or immediately should he develop severe dizziness, headache, neurological changes, confusion, unsteady gait, or falls.F/U: Follow up in about 3 months (around 11/26/2024) for Follow-up.Joesph Hill, FNP-CFamily MedicineUniversity of [facility] Surgery [facility] NetworkMedical Associates of Little [place]QUIBB: [phone] F: [phone]/11/25 3:14 PMTo my patients:  Note was  dictated using voice-recognition program which may result in minor transcription  errors.

## 2024-08-27 NOTE — Patient Instructions (Addendum)
 Call physical medicine and rehab (Dr. Felicitas) office for follow up on chronic neck and back painFasting labs ordered Orthopedic referral was placed for you today. You should receive a call from them within the next week or 2 to schedule an appointment. If you do not, please call [facility] at [phone] Monday through Thursday, 7:00 a.m. to 7:00 p.m. EST/EDT and Friday, 7:00 a.m. to 5:00 p.m., excluding major holidays to inquire about your appointment.Thank you for allowing me to take part in your care.Joesph Hill, FNPFamily Medicine09/11/25  1:59 PM

## 2024-09-01 ENCOUNTER — Other Ambulatory Visit
Admission: RE | Admit: 2024-09-01 | Discharge: 2024-09-01 | Disposition: A | Discharge status: Home / Self Care | Payer: Medicare (Managed Care) | Source: Ambulatory Visit | Attending: Family Medicine | Admitting: Family Medicine

## 2024-09-01 DIAGNOSIS — E1169 Type 2 diabetes mellitus with other specified complication: Secondary | ICD-10-CM | POA: Insufficient documentation

## 2024-09-01 LAB — LIPID PANEL
Chol/HDL Ratio: 2.6
Cholesterol: 119 mg/dL
HDL: 45 mg/dL (ref 40–60)
LDL Calculated: 48 mg/dL
Non HDL Cholesterol: 74 mg/dL
Triglycerides: 154 mg/dL — AB

## 2024-09-01 LAB — HEMOGLOBIN A1C: Hemoglobin A1C: 6.6 % — ABNORMAL HIGH (ref <=5.6)

## 2024-09-01 LAB — COMPREHENSIVE METABOLIC PANEL
ALT: 21 U/L (ref 0–50)
AST: 25 U/L (ref 0–50)
Albumin: 4.2 g/dL (ref 3.5–5.2)
Alk Phos: 74 U/L (ref 40–130)
Anion Gap: 15 (ref 7–16)
Bilirubin,Total: 0.5 mg/dL (ref 0.0–1.2)
CO2: 22 mmol/L (ref 20–28)
Calcium: 9.9 mg/dL (ref 8.6–10.2)
Chloride: 101 mmol/L (ref 96–108)
Creatinine: 1.45 mg/dL — ABNORMAL HIGH (ref 0.67–1.17)
Glucose: 171 mg/dL — ABNORMAL HIGH (ref 60–99)
Lab: 31 mg/dL — ABNORMAL HIGH (ref 6–20)
Potassium: 5.1 mmol/L (ref 3.3–5.1)
Sodium: 138 mmol/L (ref 133–145)
Total Protein: 6.5 g/dL (ref 6.3–7.7)
eGFR BY CREAT: 46 — AB

## 2024-09-02 ENCOUNTER — Ambulatory Visit: Payer: Self-pay | Admitting: Family Medicine

## 2024-09-04 ENCOUNTER — Ambulatory Visit: Payer: Medicare (Managed Care) | Admitting: Acupuncturist

## 2024-09-04 ENCOUNTER — Other Ambulatory Visit: Payer: Self-pay

## 2024-09-04 DIAGNOSIS — M545 Low back pain, unspecified: Secondary | ICD-10-CM

## 2024-09-04 DIAGNOSIS — G8929 Other chronic pain: Secondary | ICD-10-CM

## 2024-09-04 NOTE — Progress Notes (Signed)
 ACUPUNCTURE  FOLLOW UP TREATMENT  NOTE Patient: Jon Vereen HaefnerMRN: [id number]: November 22, 19379/19/2025SUBJECTIVEComplaint Status1.  Chronic low back pain  ModerateChanges since last treatmentStates that his lower back and neck always feel better with acupunctureFirst thing in the morning is especially painful and uncomfortable but once he has some coffee and gets moving around things quickly improveDenies any other Sx or complaints todayToday's Treatment Intention / Goal:Alleviate chronic low back painTREATMENTAcupunctureProne:Bilateral:HTJJ L3-S1Semispinalis Capitis MPSplenius Capitis MPLevator Scapula MPUpper Trap MP  Electro Ran 2 Hz continuous on all of the above points. Placed the heat lamp over the low back for the duration of the tx. Did stationary cupping on the back. Applied Po Sum On oil to the neck and back post tx.  Treatment Number15 Assessment:  Findings consistent with 89 y.o. male with chronic low back pain.Prognosis:  Good Contraindications/Precautions/Limitations:  Per protocolTreatment Plan: Options / plan reviewed with patient:  YesTreatment Freq / Recommendations 1 times per week [x]  Todays visit included acupuncture with the use of electrical stimulationCharles A Leatrice, LAC  Minutes Initial Time Based CPTInitial AcupunctureInitial Acupuncture with Electrical Stimulation  15 min Subsequent Time Based CPTSubsequent Unit of AcupunctureSubsequent Unit of Acupuncture with Electrical Stimulation   10 min Non-Billable Time:Retained Needle Time, Heat, Ice, Rest 10 min Time spent with patient (face to face contact): 25 min     Total Treatment Time 35 min

## 2024-09-09 ENCOUNTER — Other Ambulatory Visit: Payer: Self-pay

## 2024-09-09 ENCOUNTER — Ambulatory Visit: Payer: Medicare (Managed Care) | Admitting: Acupuncturist

## 2024-09-09 DIAGNOSIS — M545 Low back pain, unspecified: Secondary | ICD-10-CM

## 2024-09-09 DIAGNOSIS — G8929 Other chronic pain: Secondary | ICD-10-CM

## 2024-09-09 NOTE — Progress Notes (Signed)
 ACUPUNCTURE  FOLLOW UP TREATMENT  NOTE Patient: Jon Heinzman HaefnerMRN: [id number]: 1935-12-109/24/2025SUBJECTIVEComplaint Status1.  Chronic low back pain  ModerateChanges since last treatmentStates that his lower back is particularly stiff todayWorse first thing in the morningBetter with heat (ex. hot shower, etc) Neck feeling improved overallDenies any other Sx or complaints todayToday's Treatment Intention / Goal:Alleviate chronic low back painTREATMENTAcupunctureProne:Bilateral:Upper Trap MP Lower Trap MP x 2Iliocostalis Lumborum MP x 3Longissimus MP x 3 Electro Ran 2 Hz continuous on all of the above points. Placed the heat lamp over the low back for the duration of the tx. Did stationary cupping on the back. Applied Po Sum On oil to the neck and back post tx.  Treatment Number16 Assessment:  Findings consistent with 89 y.o. male with chronic low back pain.Prognosis:  Good Contraindications/Precautions/Limitations:  Per protocolTreatment Plan: Options / plan reviewed with patient:  YesTreatment Freq / Recommendations 1 times per week [x]  Todays visit included acupuncture with the use of electrical stimulationCharles A Leatrice, LAC  Minutes Initial Time Based CPTInitial AcupunctureInitial Acupuncture with Electrical Stimulation  15 min Subsequent Time Based CPTSubsequent Unit of AcupunctureSubsequent Unit of Acupuncture with Electrical Stimulation   10 min Non-Billable Time:Retained Needle Time, Heat, Ice, Rest 10 min Time spent with patient (face to face contact): 25 min     Total Treatment Time 35 min

## 2024-09-11 ENCOUNTER — Ambulatory Visit: Payer: Medicare (Managed Care) | Admitting: Acupuncturist

## 2024-09-18 ENCOUNTER — Ambulatory Visit: Payer: Medicare (Managed Care) | Admitting: Acupuncturist

## 2024-09-18 ENCOUNTER — Other Ambulatory Visit: Payer: Self-pay

## 2024-09-18 DIAGNOSIS — M542 Cervicalgia: Secondary | ICD-10-CM

## 2024-09-18 DIAGNOSIS — G8929 Other chronic pain: Secondary | ICD-10-CM

## 2024-09-18 DIAGNOSIS — M545 Low back pain, unspecified: Secondary | ICD-10-CM

## 2024-09-18 NOTE — Progress Notes (Signed)
 ACUPUNCTURE  FOLLOW UP TREATMENT  NOTE Patient: Jerod Mcquain HaefnerMRN: [id number]: 11-19-193510/3/2025SUBJECTIVEComplaint Status1.  Chronic low back pain  ModerateChanges since last treatmentStates that he woke up on Sunday morning after his last visit and had no painThis relief lasted for ~1 dayPain continues to be worse first thing in the morningBetter with heat (ex. hot shower, drinking hot coffee, etc) Neck feeling improved overallDenies any other Sx or complaints todayToday's Treatment Intention / Goal:Alleviate chronic low back painTREATMENTAcupunctureProne:Bilateral:Semispinalis Capitis MPUpper Trap MP Levator Scapula MPHTJJ L1-S1 Electro Ran 2 Hz continuous on all of the above points. Placed the heat lamp over the low back for the duration of the tx. Did stationary cupping on the back. Applied Po Sum On oil to the neck and back post tx.  Treatment Number17 Assessment:  Findings consistent with 89 y.o. male with chronic low back pain.Prognosis:  Good Contraindications/Precautions/Limitations:  Per protocolTreatment Plan: Options / plan reviewed with patient:  YesTreatment Freq / Recommendations 1 times per week [x]  Todays visit included acupuncture with the use of electrical stimulationCharles A Leatrice, LAC  Minutes Initial Time Based CPTInitial AcupunctureInitial Acupuncture with Electrical Stimulation  15 min Subsequent Time Based CPTSubsequent Unit of AcupunctureSubsequent Unit of Acupuncture with Electrical Stimulation   10 min Non-Billable Time:Retained Needle Time, Heat, Ice, Rest 10 min Time spent with patient (face to face contact): 25 min     Total Treatment Time 35 min

## 2024-09-23 ENCOUNTER — Ambulatory Visit: Payer: Medicare (Managed Care) | Admitting: Acupuncturist

## 2024-09-24 ENCOUNTER — Ambulatory Visit: Payer: Medicare (Managed Care) | Admitting: Acupuncturist

## 2024-09-24 ENCOUNTER — Other Ambulatory Visit: Payer: Self-pay

## 2024-09-24 DIAGNOSIS — M542 Cervicalgia: Secondary | ICD-10-CM

## 2024-09-24 DIAGNOSIS — G8929 Other chronic pain: Secondary | ICD-10-CM

## 2024-09-24 DIAGNOSIS — M545 Low back pain, unspecified: Secondary | ICD-10-CM

## 2024-09-24 NOTE — Progress Notes (Signed)
 ACUPUNCTURE  FOLLOW UP TREATMENT  NOTE Patient: Jon Mcnay HaefnerMRN: [id number]: 1935/06/809/9/2025SUBJECTIVEComplaint Status1.  Chronic low back pain  ModerateChanges since last treatmentStates that he is feeling really good today and that his pain seems to be much less later in the afternoon vs his usual appointment time which is earlier in the dayLower back is not as stiff or sore todayNeck feeling much looser overallHot coffee and shower continue to help his neck and allow for it to loosen up much quicker and decrease his pain levels more readily Denies any other Sx or complaints todayToday's Treatment Intention / Goal:Alleviate chronic low back painTREATMENTAcupunctureProne:Bilateral:Levator Scapula MPHTJJ L3-S1 Electro Ran 2 Hz continuous on all of the above points. Placed the heat lamp over the low back for the duration of the tx. Did stationary cupping on the back. Applied Po Sum On oil to the neck and back post tx.  Treatment Number18 Assessment:  Findings consistent with 89 y.o. male with chronic low back pain.Prognosis:  Good Contraindications/Precautions/Limitations:  Per protocolTreatment Plan: Options / plan reviewed with patient:  YesTreatment Freq / Recommendations 1 times per week [x]  Todays visit included acupuncture with the use of electrical stimulationCharles A Leatrice, LAC  Minutes Initial Time Based CPTInitial AcupunctureInitial Acupuncture with Electrical Stimulation  15 min Subsequent Time Based CPTSubsequent Unit of AcupunctureSubsequent Unit of Acupuncture with Electrical Stimulation   10 min Non-Billable Time:Retained Needle Time, Heat, Ice, Rest 10 min Time spent with patient (face to face contact): 25 min     Total Treatment Time 35 min

## 2024-09-29 ENCOUNTER — Other Ambulatory Visit: Payer: Self-pay

## 2024-09-29 ENCOUNTER — Ambulatory Visit: Payer: Medicare (Managed Care) | Admitting: Acupuncturist

## 2024-09-29 DIAGNOSIS — M545 Low back pain, unspecified: Secondary | ICD-10-CM

## 2024-09-29 DIAGNOSIS — G8929 Other chronic pain: Secondary | ICD-10-CM

## 2024-09-29 NOTE — Progress Notes (Signed)
 ACUPUNCTURE  FOLLOW UP TREATMENT  NOTE Patient: Jon Slagter HaefnerMRN: [id number]: 1935-10-1709/14/2025SUBJECTIVEComplaint Status1.  Chronic low back pain  ModerateChanges since last treatmentNotes some soreness and stiffness in his midline and b/l paramidline LS region.LBP directly affects his upper back and neck.Upper back and neck are feeling pretty good today as he had some hot coffee prior to his visit which seemed to improve things.Denies any radiation of pain into the BUE or BLE. Denies any numbness, tingling, weakness or b/b incontinence. Denies any other Sx or complaints todayToday's Treatment Intention / Goal:Alleviate chronic low back painTREATMENTAcupunctureProne:Bilateral:HTJJ L1-S1 Electro Ran 2 Hz continuous on all of the above points. Placed the heat lamp over the low back for the duration of the tx. Did stationary cupping on the back. Applied Po Sum On oil to the neck and back post tx.  Treatment Number19 Assessment:  Findings consistent with 89 y.o. male with chronic low back pain.Prognosis:  Good Contraindications/Precautions/Limitations:  Per protocolTreatment Plan: Options / plan reviewed with patient:  YesTreatment Freq / Recommendations 1 times per week [x]  Todays visit included acupuncture with the use of electrical stimulationCharles A Leatrice, LAC  Minutes Initial Time Based CPTInitial AcupunctureInitial Acupuncture with Electrical Stimulation  15 min Subsequent Time Based CPTSubsequent Unit of AcupunctureSubsequent Unit of Acupuncture with Electrical Stimulation   10 min Non-Billable Time:Retained Needle Time, Heat, Ice, Rest 10 min Time spent with patient (face to face contact): 25 min     Total Treatment Time 35 min

## 2024-10-02 ENCOUNTER — Other Ambulatory Visit: Payer: Self-pay

## 2024-10-02 ENCOUNTER — Ambulatory Visit: Payer: Medicare (Managed Care) | Attending: Family Medicine | Admitting: Family Medicine

## 2024-10-02 ENCOUNTER — Encounter: Payer: Self-pay | Admitting: Family Medicine

## 2024-10-02 VITALS — BP 116/71 | HR 65 | Ht 71.0 in | Wt 178.0 lb

## 2024-10-02 DIAGNOSIS — Z23 Encounter for immunization: Secondary | ICD-10-CM

## 2024-10-02 DIAGNOSIS — H6123 Impacted cerumen, bilateral: Secondary | ICD-10-CM

## 2024-10-02 NOTE — Progress Notes (Signed)
 [facility]:  10/17/2025Name:  ELGAR SCOGGINS   MRN:  [id number]  DOB:  1934-10-15 Subjective  In brief CEDERIC MOZLEY is a 89 y.o. year old male   has a past medical history of Arthritis, BPH (benign prostatic hypertrophy), CHF (congestive heart failure), Clotting disorder, Dementia, Diabetes mellitus, Paget's disease of bone, Palpitations, Protein S deficiency, Sinus bradycardia, and Varicella.presenting for an evaluation of Ear FullnessHistory of Present IllnessThe patient presents for evaluation of ear issues.He reports a sensation of his ears being blocked, which he believes is due to the presence of earwax. He has a history of excessive earwax production, necessitating regular flushing. He is uncertain about any changes in his auditory perception. He does not experience any earache or discharge from the ears.He is interested in receiving an influenza vaccine today.He is requesting a referral to a new primary care provider at the [facility] location.ROSAs per HPI.I have reviewed the patient's past medical, surgical, family and medication histories and made appropriate corrections and updates in their respective parts of this chart.Objective  Blood pressure 116/71, pulse 65, height 1.803 m (5' 11), weight 80.7 kg (178 lb), SpO2 (P) 100%. Wt Readings from Last 1 Encounters: 10/02/24 80.7 kg (178 lb)  BP Readings from Last 3 Encounters: 10/02/24 116/71 08/27/24 98/62 04/28/24 (P) 96/55  Physical ExamVitals reviewed. Constitutional:     Appearance: Normal appearance. HENT:    Right Ear: Tympanic membrane normal. There is impacted cerumen (pre-irrigation).    Left Ear: Tympanic membrane normal. There is impacted cerumen (pre-irrigation). Cardiovascular:    Rate and Rhythm: Normal rate. Pulmonary:    Effort: Pulmonary effort is normal. Skin:   General: Skin is warm and dry.  PRE-PROCEDURE EXAM: TM cannot be visualized due to total occlusion/impaction of the ear canal.PROCEDURE INDICATION: remove wax to visualize ear drum & improve hearing & relieve discomfortCONSENT:  VerbalPROCEDURE NOTE: RIGHT EAR:  I used a wax curette under direct vision and magnification with an otoscope to free the cerumen bolus from the ear wall and then successfully removed a fair amount of cerumen. The ear was then irrigated with low pressure warm water  to remove the remaining wax.LEFT EAR:  I used a wax curette under direct vision and magnification with an otoscope to free the cerumen bolus from the ear wall and then successfully removed a fair amount of wax. The ear was then irrigated with low pressure warm water  to remove the remaining wax.POST- PROCEDURE EXAM: TM successfully visualized and found to be intact with no redness or perforation. 98% of ear wax had been removed from the ear canal.POST-PROCEDURE INSTRUCTIONS: avoid use of Q-tips.Time and Effort: significantly challenging wax to remove. Assessment & Plan  OTT ZIMMERLE is a 89 y.o. male presenting for evaluation of ear fullness.1. Bilateral impacted cerumen (Primary)- Exam today reveals bilateral cerumen impaction. Gentle irrigation and curette were used for successful cerumen removal, patient tolerated well. TM was visualized without acute concerns at this time.- Emphasized importance of avoiding sticking objects, such as q-tips in ear.- Can consider OTC Debrox as needed if prone to frequent cerumen impactions.- Advised follow-up as needed if symptoms recur. I did advise patient if seeking new PCP closer to [facility] office he should contact offices in his area to find out if there are any providers accepting new patients.- Ear irrigation2. Need for influenza vaccination- Influenza Trivalent ADJ PF 43yr+ F/U: Follow up if symptoms worsen or fail to improve.Joesph Hill,  FNP-CFamily Medicine10/17/25  10:05 AM

## 2024-10-06 ENCOUNTER — Ambulatory Visit: Payer: Medicare (Managed Care) | Admitting: Acupuncturist

## 2024-10-09 ENCOUNTER — Other Ambulatory Visit: Payer: Self-pay | Admitting: Primary Care

## 2024-10-09 DIAGNOSIS — E1169 Type 2 diabetes mellitus with other specified complication: Secondary | ICD-10-CM

## 2024-10-09 NOTE — Telephone Encounter (Signed)
 [facility] OF CARE NOTE10/24/25[name]  April 11, 1934  MRN: [id number] of CarePlan:Orders Placed This Encounter  semaglutide , 0.25 or 0.5 mg/dose, (OZEMPIC , 0.25 OR 0.5 MG/DOSE,) pen  Jon Hill, FNP-CFamily Medicine10/24/25  2:31 PM____Medications Discontinued During This Encounter Medication Reason  OZEMPIC , 0.25 OR 0.5 MG/DOSE, pen

## 2024-10-09 NOTE — Telephone Encounter (Signed)
 [facility] Refill Note10/24/25[name]  12-Feb-1934  MRN: [name] office visit: 10/17/2025Last telehome visit: 10/17/2025Last APP visit:  10/17/2025Recent VisitsDate Type Provider Dept 10/02/24 Office Visit Joyice Search, NP [facility] 08/27/24 Office Visit Joyice Search, NP [facility] Showing recent visits within past 185 days in an active department and meeting all other requirementsFuture AppointmentsNo visits were found meeting these conditions.Showing future appointments within next 0 days in an active department and meeting all other requirementsOpioid Drug Screen:No results for input(s): AMPU, BEU, OPSU, OXYU, THCU, BZDU, COPS, CTHC, UCRNC, TRAMD in the last 8760 hours.LABORATORY DATA:Last Renal Func was  09/01/2024: Creatinine 1.45 mg/dL (H; Ref range: 9.32 - 8.82 mg/dL); eGFR BY CREAT 46 (!; Ref range: ); Potassium 5.1 mmol/L (Ref range: 3.3 - 5.1 mmol/L). Last LFTs was  09/01/2024: ALT 21 U/L (Ref range: 0 - 50 U/L); AST 25 U/L (Ref range: 0 - 50 U/L). Last Lipid panel was 09/01/2024: Cholesterol 119 mg/dL (Ref range: mg/dL); HDL 45 mg/dL (Ref range: 40 - 60 mg/dL); LDL Calculated 48 mg/dL (Ref range: mg/dL).Last A1C was 09/01/2024: Hemoglobin A1C 6.6 % (H; Ref range: <=5.6 %).Last TFT was 02/08/2023: TSH 5.65 uIU/mL (H; Ref range: 0.27 - 4.20 uIU/mL), 11/29/2021: Free T4 1.1 ng/dL (Ref range: 0.9 - 1.7 ng/dL).[name], LPN

## 2024-10-13 ENCOUNTER — Other Ambulatory Visit: Payer: Self-pay

## 2024-10-13 ENCOUNTER — Ambulatory Visit: Payer: Medicare (Managed Care) | Admitting: Acupuncturist

## 2024-10-13 DIAGNOSIS — M545 Low back pain, unspecified: Secondary | ICD-10-CM

## 2024-10-13 DIAGNOSIS — G8929 Other chronic pain: Secondary | ICD-10-CM

## 2024-10-13 NOTE — Progress Notes (Signed)
 ACUPUNCTURE  FOLLOW UP TREATMENT  NOTE Patient: Jon Rodeheaver HaefnerMRN: [id number]: 1935/05/1609/28/2025SUBJECTIVEComplaint Status1.  Chronic low back pain  ModerateChanges since last treatmentNotes some soreness and stiffness in his midline and b/l paramidline LS region.LBP directly affects his upper back and neck.Upper back and neck are feeling pretty good today as he had some hot coffee prior to his visit which seemed to improve things.Denies any radiation of pain into the BUE or BLE. Denies any numbness, tingling, weakness or b/b incontinence. Denies any other Sx or complaints todayToday's Treatment Intention / Goal:Alleviate chronic low back painTREATMENTAcupunctureProne:Bilateral:HTJJ L1-S1 Electro Ran 2 Hz continuous on all of the above points. Placed the heat lamp over the low back for the duration of the tx. Did stationary cupping on the back. Applied Po Sum On oil to the neck and back post tx.  Treatment Number20 Assessment:  Findings consistent with 89 y.o. male with chronic low back pain.Prognosis:  Good Contraindications/Precautions/Limitations:  Per protocolTreatment Plan: Options / plan reviewed with patient:  YesTreatment Freq / Recommendations 1 times per week [x]  Todays visit included acupuncture with the use of electrical stimulationCharles A Leatrice, LAC  Minutes Initial Time Based CPTInitial AcupunctureInitial Acupuncture with Electrical Stimulation  15 min Subsequent Time Based CPTSubsequent Unit of AcupunctureSubsequent Unit of Acupuncture with Electrical Stimulation   10 min Non-Billable Time:Retained Needle Time, Heat, Ice, Rest 10 min Time spent with patient (face to face contact): 25 min     Total Treatment Time 35 min

## 2024-10-15 ENCOUNTER — Encounter: Payer: Self-pay | Admitting: Orthopedic Surgery

## 2024-10-15 ENCOUNTER — Other Ambulatory Visit: Payer: Self-pay

## 2024-10-15 ENCOUNTER — Ambulatory Visit: Payer: Medicare (Managed Care) | Attending: Orthopedic Surgery | Admitting: Orthopedic Surgery

## 2024-10-15 ENCOUNTER — Ambulatory Visit
Admission: RE | Admit: 2024-10-15 | Discharge: 2024-10-15 | Disposition: A | Discharge status: Home / Self Care | Payer: Medicare (Managed Care) | Source: Ambulatory Visit

## 2024-10-15 VITALS — BP 99/60 | Ht 71.0 in | Wt 180.3 lb

## 2024-10-15 DIAGNOSIS — M25561 Pain in right knee: Secondary | ICD-10-CM

## 2024-10-15 DIAGNOSIS — M1612 Unilateral primary osteoarthritis, left hip: Secondary | ICD-10-CM

## 2024-10-15 DIAGNOSIS — M79605 Pain in left leg: Secondary | ICD-10-CM

## 2024-10-15 DIAGNOSIS — M1711 Unilateral primary osteoarthritis, right knee: Secondary | ICD-10-CM

## 2024-10-15 DIAGNOSIS — Z96652 Presence of left artificial knee joint: Secondary | ICD-10-CM

## 2024-10-15 DIAGNOSIS — Z471 Aftercare following joint replacement surgery: Secondary | ICD-10-CM

## 2024-10-15 DIAGNOSIS — M85852 Other specified disorders of bone density and structure, left thigh: Secondary | ICD-10-CM

## 2024-10-15 MED ORDER — LIDOCAINE HCL 1 % IJ SOLN *I*
5.0000 mL | Freq: Once | INTRAMUSCULAR | Status: AC | PRN
Start: 2024-10-15 — End: 2024-10-15
  Administered 2024-10-15: 5 mL via INTRA_ARTICULAR

## 2024-10-15 MED ORDER — ETHYL CHLORIDE EX AERO (MULTIPLE PATIENTS) *I*
1.0000 | INHALATION_SPRAY | Freq: Once | CUTANEOUS | Status: AC | PRN
Start: 2024-10-15 — End: 2024-10-15
  Administered 2024-10-15: 1 via TOPICAL

## 2024-10-15 MED ORDER — METHYLPREDNISOLONE ACETATE 40 MG/ML IJ SUSP *I*
40.0000 mg | Freq: Once | INTRAMUSCULAR | Status: AC | PRN
Start: 2024-10-15 — End: 2024-10-15
  Administered 2024-10-15: 40 mg via INTRA_ARTICULAR

## 2024-10-15 NOTE — Procedures (Signed)
 Large Joint Aspiration/Injection Procedure: R knee intra - articularDate/Time: 10/15/2024  9:45 AM EDTConsent given by: patientSite marked: site markedTimeout: Immediately prior to procedure a time out was called to verify the correct patient, procedure, equipment, support staff and site/side marked as required Procedure DetailsLocation: knee - R knee intra - articularPreparation: The site was prepped using the usual aseptic technique.Anesthetics administered:  1 spray ethyl chloride; 5 mL lidocaine  HCL 1 %Intra-Articular Steroids administered:  40 mg methylPREDNISolone  acetate 40 MG/MLDressing:  A dry, sterile dressing was applied.Patient tolerance: patient tolerated the procedure well with no immediate complications

## 2024-10-15 NOTE — Patient Instructions (Addendum)
 Right knee:Today we performed a steroid (cortisone) injection, it is not uncommon for you to have slight increase in pain for the first day or 2 following the injection.  You can use some ice to help control this pain.  It may take 7-10 days for the injection to take full effect.  You may have 3-4 of these a year if they give you at least 3 months of pain relief.We discussed the benefits of exercise which is like physical medicine.  Therefore, I would recommend you visit a physical therapist of your choice. The PT will educate you on how to make these exercises either easier (if they are too hard) or harder (if they are too easy).  The goal is to transition to the home exercise program provided to you today whichyou can use to maintain functional gains that you obtain. We recommend purchasing diclofenac  gel (voltaren  gel). This is a great anti-inflammatory that you can use up to 4x per day. This medication can be purchased over the counter. Please call my office at [phone] (option 3) with any questions or problems.

## 2024-10-15 NOTE — Progress Notes (Signed)
 ASSESSMENT/PLAN----------------------Diagnoses:1. Right knee osteoarthritis with bone-on-bone medial compartment2. History of left knee unicompartmental medial knee replacement3. Paget's disease affecting the back4. History of venous thromboembolism (on blood thinners)   Plan:- Administered cortisone injection to right knee- Discussed conservative management options including physical therapy, Voltaren  gel application (recommended twice daily for at least 14 days), and continued use of assistive devices- Reviewed risks and benefits of knee replacement as a future option if conservative measures fail- Encouraged continued exercise program at [facility] with emphasis on strengthening exercises- Ordered updated femur films for documentationFollow-up:- Return in 1-2 weeks to assess response to cortisone injection- Consider physical therapy evaluation if no improvement with injectionSUBJECTIVE--------------Chief Complaint: Right knee painHistory of Present Illness: Patient is an 89 year old male presenting as a new patient with right knee pain that has been ongoing for approximately 5 years. He reports the pain is worsening and states every once in a while it'll catch me. The pain is particularly bothersome when he is out too long or when he stays in one position for an extended period. He uses a walker at home and a cane when out. He denies pain in his hips but reports numbness in his feet. Patient has a history of left knee unicompartmental medial knee replacement performed approximately 20 years ago with good results. He also has a history of skin graft on his leg from an injury in the 1960s. Patient reports attending the [facility] for exercise, including using the [employer], but states his routine was interrupted when his friend who takes him got sick. He plans to resume this activity.Past Medical History Discussed in Visit:- VTE History: Yes (history of two blood clots, one in  each leg)- Specialists: Cardiologist (follows up every 6 months)- Paget's disease affecting the back- No history of cancerSocial History Discussed in Visit: Lives with wife, daughter helps them out. Uses walker in the house and cane outside. Exercises at [facility] when able.Allergies Discussed in Visit: None mentionedMedications Discussed in Visit:- Blood thinner (for history of blood clots)- Tylenol  for pain- No narcotics or controlled substancesOBJECTIVE------------Physical Exam:- Patient initially seen in wheelchair- Able to transfer from sit to stand- Ambulates with use of cane, antalgic gait but steady- Bilateral genu varus- Negative Stinchfield test- Range of motion bilateral knees: -10 to approximately 130 degrees- Previous midline incision on the left knee- 2+ DP/PT pulses- 5/5 knee extension, dorsiflexion, plantar flexion- Straight leg raise: negative for hip pain bilaterally- Redness noted on right kneeImaging:- X-rays taken today (10/15/2024) were personally reviewed, interpreted, and demonstrated tricompartmental degenerative joint disease in right knee with bone-on-bone arthritis in the medial compartment- Left knee shows unicompartmental medial knee replacement with no signs of loosening (AP view only)- CT scan of abdomen and pelvis from 2024 shows pathology of proximal left femur consistent with Paget's disease, also seen on CT scan from 2018- X-rays of the left femur taken today show proximal femoral changes consistent with Paget's disease and no aggressive features no significant changes from CT scanPatient has given prior verbal consent to have the conversation recorded and summarized by the [employer].Suggested MDM level: 99214Thomas Billy, MD, MPTAssociate ProfessorDivision of Adult ReconstructionDepartment of Orthopaedic SurgeryUniversity of RochesterOffice phone:  [phone]:  [phone]

## 2024-10-20 ENCOUNTER — Ambulatory Visit: Payer: Medicare (Managed Care) | Admitting: Acupuncturist

## 2024-10-20 ENCOUNTER — Other Ambulatory Visit: Payer: Self-pay

## 2024-10-20 DIAGNOSIS — G8929 Other chronic pain: Secondary | ICD-10-CM

## 2024-10-20 DIAGNOSIS — M545 Low back pain, unspecified: Secondary | ICD-10-CM

## 2024-10-20 NOTE — Progress Notes (Signed)
 ACUPUNCTURE  FOLLOW UP TREATMENT  NOTE Patient: Jon Donley HaefnerMRN: [id number]: April 30, 193511/4/2025SUBJECTIVEComplaint Status1.  Chronic low back pain  ModerateChanges since last treatmentPt states that this past week wasn't as good as he was dealing with some BLE weakness. He also c/o chronic midline and b/l paramidline LS pain today that does not radiate into his buttocks or BLE. He denies any numbness, tingling or b/b incontinence. He denies any other Sx or complaints today. Today's Treatment Intention / Goal:Alleviate chronic low back painTREATMENTAcupunctureProne:Bilateral:HTJJ L3-S1PSIS Electro Ran 2 Hz continuous on all of the above points. Placed the heat lamp over the low back for the duration of the tx. Did stationary cupping on the back. Applied Po Sum On oil to the neck and back post tx.  Treatment Number21 Assessment:  Findings consistent with 89 y.o. male with chronic low back pain.Prognosis:  Good Contraindications/Precautions/Limitations:  Per protocolTreatment Plan: Options / plan reviewed with patient:  YesTreatment Freq / Recommendations 1 times per week [x]  Todays visit included acupuncture with the use of electrical stimulationCharles A Leatrice, LAC  Minutes Initial Time Based CPTInitial AcupunctureInitial Acupuncture with Electrical Stimulation  15 min Subsequent Time Based CPTSubsequent Unit of AcupunctureSubsequent Unit of Acupuncture with Electrical Stimulation   10 min Non-Billable Time:Retained Needle Time, Heat, Ice, Rest 10 min Time spent with patient (face to face contact): 25 min     Total Treatment Time 35 min

## 2024-10-27 ENCOUNTER — Other Ambulatory Visit: Payer: Self-pay

## 2024-10-27 ENCOUNTER — Ambulatory Visit: Payer: Medicare (Managed Care) | Admitting: Acupuncturist

## 2024-10-27 DIAGNOSIS — G8929 Other chronic pain: Secondary | ICD-10-CM

## 2024-10-27 DIAGNOSIS — M545 Low back pain, unspecified: Secondary | ICD-10-CM

## 2024-10-27 NOTE — Progress Notes (Signed)
 ACUPUNCTURE  FOLLOW UP TREATMENT  NOTE Patient: Trystian Crisanto HaefnerMRN: [id number]: [birth year]10/11/2025SUBJECTIVEComplaint Status1.  Chronic low back pain  ModerateChanges since last treatmentPt continues to endorse mild midline lumbar pain and stiffness. He notes that it does improve with activity and heat. He also notes that his neck and upper back feel better when his lower back is feeling good. He denies any numbness, tingling or b/b incontinence. He denies any other Sx or complaints today. Today's Treatment Intention / Goal:Alleviate chronic low back painTREATMENTAcupunctureProne:Bilateral:HTJJ L1-S1 Electro Ran 2 Hz continuous on all of the above points. Placed the heat lamp over the low back for the duration of the tx. Did stationary cupping on the back. Applied Po Sum On oil to the neck and back post tx.  Treatment Number22 Assessment:  Findings consistent with 89 y.o. male with chronic low back pain.Prognosis:  Good Contraindications/Precautions/Limitations:  Per protocolTreatment Plan: Options / plan reviewed with patient:  YesTreatment Freq / Recommendations 1 times per week [x]  Todays visit included acupuncture with the use of electrical stimulationCharles A Leatrice, LAC  Minutes Initial Time Based CPTInitial AcupunctureInitial Acupuncture with Electrical Stimulation  15 min Subsequent Time Based CPTSubsequent Unit of AcupunctureSubsequent Unit of Acupuncture with Electrical Stimulation   10 min Non-Billable Time:Retained Needle Time, Heat, Ice, Rest 10 min Time spent with patient (face to face contact): 25 min     Total Treatment Time 35 min

## 2024-11-03 ENCOUNTER — Ambulatory Visit: Payer: Medicare (Managed Care) | Admitting: Acupuncturist

## 2024-11-10 ENCOUNTER — Ambulatory Visit: Payer: Medicare (Managed Care) | Admitting: Acupuncturist

## 2024-11-17 ENCOUNTER — Ambulatory Visit: Payer: Medicare (Managed Care) | Admitting: Acupuncturist

## 2024-11-17 ENCOUNTER — Other Ambulatory Visit: Payer: Self-pay

## 2024-11-17 DIAGNOSIS — M545 Low back pain, unspecified: Secondary | ICD-10-CM

## 2024-11-17 DIAGNOSIS — G8929 Other chronic pain: Secondary | ICD-10-CM

## 2024-11-17 NOTE — Progress Notes (Signed)
 ACUPUNCTURE  FOLLOW UP TREATMENT  NOTE Patient: Jon Atkerson HaefnerMRN: [id number]: [birth year]/3711/2/2025SUBJECTIVEComplaint Status1.  Chronic low back pain  ModerateChanges since last treatmentLast seen for acupuncture on 10/27/24 for similar complaintHad to cancel his last two visits due to him having a mechanical fall at home while in the bathroom Denies hitting his head or having a LOC, but did hit his lower back and has an area of healing ecchymosis in his left lumbar regionStill endorses some pain in his lumbar region (left > right) but denies any radiation of pain into his gluteal region or LEDenies any numbness, tingling, weakness or b/b incontinenceHe is accompanied by his daughter todayDenies any other Sx or complaints todayToday's Treatment Intention / Goal:Alleviate chronic low back painTREATMENTAcupunctureProne:Bilateral:HTJJ L1-S1 Electro Ran 2 Hz continuous on all of the above points. Placed the heat lamp over the low back for the duration of the tx. Did stationary cupping on the back. Applied Po Sum On oil to the neck and back post tx.  Treatment Number23 Assessment:  Findings consistent with 89 y.o. male with chronic low back pain.Prognosis:  Good Contraindications/Precautions/Limitations:  Per protocolTreatment Plan: Options / plan reviewed with patient:  YesTreatment Freq / Recommendations 1 times per week [x]  Todays visit included acupuncture with the use of electrical stimulationCharles A Leatrice, LAC  Minutes Initial Time Based CPTInitial AcupunctureInitial Acupuncture with Electrical Stimulation  15 min Subsequent Time Based CPTSubsequent Unit of AcupunctureSubsequent Unit of Acupuncture with Electrical Stimulation   10 min Non-Billable Time:Retained Needle Time, Heat, Ice, Rest 10 min Time spent with patient (face to face contact): 25 min     Total  Treatment Time 35 min

## 2024-11-23 ENCOUNTER — Telehealth: Payer: Self-pay | Admitting: Family Medicine

## 2024-11-23 NOTE — Telephone Encounter (Signed)
 Edwin and his wife called.  He does not want to stay at [facility] as he now has a NP after Dr. Epimenio left the office.Appt scheduled for 04/16/2025 with Dr. Austin.  Satish's wife requested a Friday (or Monday) date.Arrive 15 mins prior, go to front desk.

## 2024-11-24 ENCOUNTER — Other Ambulatory Visit: Payer: Self-pay

## 2024-11-24 ENCOUNTER — Ambulatory Visit: Payer: Medicare (Managed Care) | Admitting: Acupuncturist

## 2024-11-24 DIAGNOSIS — G8929 Other chronic pain: Secondary | ICD-10-CM

## 2024-11-24 DIAGNOSIS — M545 Low back pain, unspecified: Secondary | ICD-10-CM

## 2024-11-24 NOTE — Progress Notes (Signed)
 ACUPUNCTURE  FOLLOW UP TREATMENT  NOTE Patient: Jon Kelnhofer HaefnerMRN: [id number]: [birth year]/9/2025SUBJECTIVEComplaint Status1.  Chronic low back pain  ModerateChanges since last treatmentLast seen for acupuncture on 11/17/24 for similar complaintStill endorses some pain in his lumbar region (left > right) but notes that it is feeling a little better every day Denies any radiation of pain into his gluteal region or LEDenies any numbness, tingling, weakness or b/b incontinenceHe is accompanied by his daughter todayDenies any other Sx or complaints todayToday's Treatment Intention / Goal:Alleviate chronic low back painTREATMENTAcupunctureProne:Bilateral:HTJJ L1-S1 Electro Ran 1 Hz continuous on all of the above points. Placed the heat lamp over the low back for the duration of the tx. Did stationary cupping on the back. Applied Po Sum On oil to the neck and back post tx.  Treatment Number24 Assessment:  Findings consistent with 89 y.o. male with chronic low back pain.Prognosis:  Good Contraindications/Precautions/Limitations:  Per protocolTreatment Plan: Options / plan reviewed with patient:  YesTreatment Freq / Recommendations 1 times per week [x]  Todays visit included acupuncture with the use of electrical stimulationCharles A Leatrice, LAC  Minutes Initial Time Based CPTInitial AcupunctureInitial Acupuncture with Electrical Stimulation  15 min Subsequent Time Based CPTSubsequent Unit of AcupunctureSubsequent Unit of Acupuncture with Electrical Stimulation   10 min Non-Billable Time:Retained Needle Time, Heat, Ice, Rest 10 min Time spent with patient (face to face contact): 25 min     Total Treatment Time 35 min

## 2024-11-27 ENCOUNTER — Ambulatory Visit: Payer: Medicare (Managed Care) | Admitting: Family Medicine

## 2024-12-01 ENCOUNTER — Ambulatory Visit: Payer: Medicare (Managed Care) | Admitting: Acupuncturist

## 2024-12-01 ENCOUNTER — Other Ambulatory Visit: Payer: Self-pay

## 2024-12-01 DIAGNOSIS — G8929 Other chronic pain: Secondary | ICD-10-CM

## 2024-12-01 DIAGNOSIS — M545 Low back pain, unspecified: Secondary | ICD-10-CM

## 2024-12-01 NOTE — Progress Notes (Signed)
 ACUPUNCTURE  FOLLOW UP TREATMENT  NOTE Patient: Jon Rorke HaefnerMRN: [id number]: [birth year]/16/2025SUBJECTIVEComplaint Status1.  Chronic low back pain  ModerateChanges since last treatmentLast seen for acupuncture on 11/24/24 for similar complaintStates that his lower back is quite sore today for some reason but is uncertain what caused the increase in discomfortDenies any injuries, trauma or falls since his last visitDenies any radiation of pain into his gluteal region or LEDenies any numbness, tingling, weakness or b/b incontinenceHe is accompanied by his daughter today and ambulates with the assistance of a caneDenies any other Sx or complaints todayToday's Treatment Intention / Goal:Alleviate chronic low back painTREATMENTAcupunctureProne:Bilateral:QL MPIliocostalis Lumborum MP Longissimus MPMultifidus MP x 2 Electro Ran 1 Hz continuous on all of the above points. Placed the heat lamp over the low back for the duration of the tx. Did stationary cupping on the back. Applied Po Sum On oil to the neck and back post tx.  Treatment Number25 Assessment:  Findings consistent with 89 y.o. male with chronic low back pain.Prognosis:  Good Contraindications/Precautions/Limitations:  Per protocolTreatment Plan: Options / plan reviewed with patient:  YesTreatment Freq / Recommendations 1 times per week [x]  Todays visit included acupuncture with the use of electrical stimulationCharles A Leatrice, LAC  Minutes Initial Time Based CPTInitial AcupunctureInitial Acupuncture with Electrical Stimulation  15 min Subsequent Time Based CPTSubsequent Unit of AcupunctureSubsequent Unit of Acupuncture with Electrical Stimulation   10 min Non-Billable Time:Retained Needle Time, Heat, Ice, Rest 10 min Time spent with patient (face to face contact): 25 min     Total Treatment Time 35 min

## 2024-12-03 ENCOUNTER — Other Ambulatory Visit: Payer: Self-pay

## 2024-12-03 ENCOUNTER — Ambulatory Visit: Payer: Medicare (Managed Care) | Attending: Family Medicine | Admitting: Family Medicine

## 2024-12-03 ENCOUNTER — Telehealth: Payer: Self-pay | Admitting: Family Medicine

## 2024-12-03 ENCOUNTER — Encounter: Payer: Self-pay | Admitting: Family Medicine

## 2024-12-03 VITALS — BP 110/69 | HR 76 | Ht 71.0 in | Wt 178.0 lb

## 2024-12-03 DIAGNOSIS — E1169 Type 2 diabetes mellitus with other specified complication: Secondary | ICD-10-CM

## 2024-12-03 DIAGNOSIS — M25561 Pain in right knee: Secondary | ICD-10-CM

## 2024-12-03 DIAGNOSIS — G8929 Other chronic pain: Secondary | ICD-10-CM

## 2024-12-03 DIAGNOSIS — F0150 Vascular dementia without behavioral disturbance: Secondary | ICD-10-CM

## 2024-12-03 DIAGNOSIS — M47812 Spondylosis without myelopathy or radiculopathy, cervical region: Secondary | ICD-10-CM

## 2024-12-03 DIAGNOSIS — M545 Low back pain, unspecified: Secondary | ICD-10-CM

## 2024-12-03 NOTE — Telephone Encounter (Signed)
 Please let patient know that home draw has been scheduled for him for Tuesday 12/23, that they will call the day before with a window of time.SABRA

## 2024-12-03 NOTE — Patient Instructions (Addendum)
 Your instructions:Please Follow up in about 3 months (around 03/03/2025) for Follow-up. Should you any acute concerns arise and you need to be seen sooner, please call the office at the following number: [phone].Lab orders were placed for you this visit. Please stop by a [facility] lab and have these done. No lab slip is needed. This is not fasting lab work.Please have copy of diabetic foot exam faxed to our office by your podiatrist.  If you should need any refills on medications please contact our office via phone or Mychart message. Here are IMPORTANT changes made to your medications today:- Stop Actos  as recommended by your cardiologist given your history of heart failure - Continue Ozempic , Metformin , Jardiance  Thank you for allowing me to take part in your care.Jon [name], FNPFamily Medicine12/18/2025  1:37 PM

## 2024-12-03 NOTE — Telephone Encounter (Signed)
 Patient notified

## 2024-12-03 NOTE — Progress Notes (Signed)
 12/18/2025Patient: Jon [name] DOB: December 24, 1933 MRN: [id number] DOS: 12/03/2024 RE: [facility] OUTPATIENT PROGRESS NOTESubjective I had the pleasure of seeing Jon [name] for follow up today.In brief, he is a 89 y.o. male presenting for evaluation of Diabetes.History of Present IllnessThe patient presents for routine follow up visit. He is accompanied by his daughter.He reports a lack of strength in his legs, although there is no associated swelling. Bilateral leg pain and numbness are present in the front of his feet, including the toes and heels. He has been diagnosed with diabetic neuropathy. He has not been monitoring his blood glucose levels at home but maintains a controlled diet. Currently, he is on Ozempic  0.5 mg weekly, Jardiance , Actos , and metformin , with no reported gastrointestinal side effects or constipation. He is up to date on his eye exam and sees Dr. Winda every month for his toenails and foot check.Dizziness has shown some improvement since starting midodrine. He continues to take Jardiance  for heart failure and reports no excessive leg swelling or trouble breathing. He follows with cardiology (Dr. Jama) regularly. Back pain is managed with acupuncture and cupping, which have proven beneficial. His lower back pain was previously attributed to arthritis by Dr. Felicitas (PM&R).Weakness is reported in both knees and from the knee down, with no strength. Steroid injections for his knees did not provide relief. He expresses interest in home physical therapy due to fear of falling and lack of transportation.Memory appears stable, with no reported agitation, wandering, or safety concerns. He is able to count backwards in sevens and is currently on memantine  as prescribed by Dr. Epimenio.Diet: Maintains a controlled diet with minimal sugar intake.Review of symptoms is as above.Current Outpatient Medications Medication  Instructions  acetaminophen  (TYLENOL ) 500 mg, PRN  Blood Glucose Calibration (OT ULTRA/FASTTK CNTRL SOLN) SOLN As directed  blood glucose monitor (FREESTYLE LITE) kit TEST AS DIRECTED  blood glucose monitor w/Device KIT Test as directed  cholecalciferol (VITAMIN D -1000 MAX ST) 1,000 units, DAILY  Coenzyme Q10 200 mg, DAILY  dofetilide (TIKOSYN) 250 MCG capsule 1 capsule, 2 TIMES DAILY  Eliquis  5 mg, Oral, EVERY 12 HOURS  empagliflozin  (JARDIANCE ) 10 mg, Oral, EVERY MORNING  finasteride  (PROSCAR ) 5 mg tablet TAKE 1 TABLET BY MOUTH EVERY DAY. FOR USE BY MEN ONLY  Fish Oil 1 g, DAILY  generic DME Dispense 1 single point cane. Use as directed.  insulin  pen needle (BD PEN NEEDLE NANO 2ND GEN) 32G X 4 MM Use 1 time per week.  lancets (ONETOUCH ULTRASOFT) Use   2  times per day as instructed for blood glucose testing.  Lancets Micro Thin 33G MISC Use as directed once daily. Dispense lancets for one touch verio  memantine  (NAMENDA ) 10 mg, Oral, 2 TIMES DAILY  metFORMIN  (GLUCOPHAGE ) 500 mg, Oral, 2 TIMES DAILY WITH MEALS  metoprolol  succinate ER (TOPROL -XL) 25 mg, Oral, EVERY 12 HOURS  midodrine (PROAMATINE) 5 mg, 3 TIMES DAILY  Multiple Vitamins-Minerals (MULTI VITAMIN/MINERALS PO) Take by mouth  pioglitazone  (ACTOS ) 15 mg, Oral, DAILY  rosuvastatin  (CRESTOR ) 10 mg, Oral, DAILY WITH DINNER  semaglutide , 0.25 or 0.5 mg/dose, (OZEMPIC , 0.25 OR 0.5 MG/DOSE,) pen INJECT 0.5MG  SUBCUTANEOUSLY (UNDER THE SKIN) ONCE WEEKLY  spironolactone (ALDACTONE) 25 mg, DAILY  I have reviewed the patient's past medical, surgical, family and medication histories and made appropriate corrections and updates in their respective parts of this chart.Objective Today, vital signs are as follows: Blood pressure 110/69, pulse 76, height 1.803 m (5' 11), weight 80.7 kg (178 lb), SpO2 (  P) 99%.Physical ExamVitals reviewed. Constitutional:     General: He is not in acute distress.    Appearance: Normal appearance. He is not ill-appearing. Cardiovascular:    Rate and Rhythm: Normal rate and regular rhythm.    Heart sounds: Normal heart sounds. No murmur heard.   No friction rub. No gallop. Pulmonary:    Effort: Pulmonary effort is normal. No respiratory distress.    Breath sounds: Normal breath sounds. No wheezing, rhonchi or rales. Musculoskeletal:    Right lower leg: No tenderness. No edema.    Left lower leg: No tenderness. No edema. Skin:   General: Skin is warm and dry. Results:Lab Results Component Value Date  HA1C 6.6 (H) 09/01/2024   Lab results: 09/16/251200 Sodium 138 Potassium 5.1 Chloride 101 CO2 22 UN 31* Creatinine 1.45* Glucose 171* Calcium  9.9 Total Protein 6.5 Albumin 4.2 ALT 21 AST 25 Alk Phos 74 Bilirubin,Total 0.5 Assessment & Plan Jon [name] is a 89 y.o. male presenting for follow up.1. Type 2 diabetes mellitus with other specified complication, without long-term current use of insulin  (Primary)Last A1C was 09/01/2024: Hemoglobin A1C 6.6 % (H; Ref range: <=5.6 %). ADA guidelines the patient A1C goal is: less than 8. Per our records the patient is UTD on diabetic eye exam. He is overdue for diabetic foot exam. Patient is prescribed statin to reduce risk of cardiovascular events. - Chronic issue, stable, at goal <8%.- Recommend discontinuation of Actos  at this time per recommendation of his cardiologist given hx CHF, reviewed associated volume overload risk. The patient currently has excellent glycemic control and the glucose-lowering benefit of Actos  does not outweigh the CV and volume overload risks at this time. Reviewed should glycemic control worsen we could consider increasing GLP1 as tolerated.- Continue Ozempic  0.5 mg weekly, Metformin  500 mg BID, Jardiance  10 mg daily; tolerating current regimen well without report of adverse side effects- CV risk reduction: continue  Rosuvastatin  10 mg daily- Discussed patient's daily dietary preferences and suggested modifications including limiting sugar and carbs in diet, reading food labels, eating whole grains, incorporating fruits and vegetables.Recommended finding creative ways to increase physical activity, motivation to find enjoyable forms of exercise per patient interests.- Encouraged to have records faxed from podiatrist regarding diabetic foot exam for our records. - Labs: A1c +CMP/BMP q3 month, Lipid Panel q3-6 months  [x]  urine microalb: UTD- Vaccines: Pneumococcal vaccine [x]  completed []  declined  Influenza [x]  completed []  Not in season []  declined - Hemoglobin A1c; Future- Comprehensive metabolic panel; Future2. Vascular dementia- Chronic issue, remains stable- Continue Memantine  10 mg BID3. Cervical spondylosis without myelopathy4. Chronic low back pain5. Chronic pain of right kneeChronic without exacerbation. Reassuring history of present illness and physical exam. - Has noted positive benefit with routine acupuncture sessions. Should cervical or lumbar pain symptoms worsen I do recommend PM&R follow up, as previously followed with office of Dr. Felicitas, whom wanted to see patient back in office for follow-up after his MRI in the spring which was not completed.-  Activity as tolerated.- For pain relief can use RICE therapy and OTC Tylenol  on a short term basis.- Encouraged maintaining a healthy weight/appropriate weight loss measures.- Encouraged an appropriate aerobic exercise program.- Physical therapy referral ordered for strengthening and stabilization. The patient's daughter is requesting home PT given his high fall risk and lack of transportation due to caregiver's work schedule. Advised patient's daughter I cannot guarantee home PT will be approved given high patient volume however referral has been placed to inquire. - AMB REFERRAL TO  HOME HEALTH SERVICES -  ADULTF/U: Follow up in about 3 months (around 03/03/2025) for Follow-up DM.Joesph Hill, FNP-CFamily MedicineUniversity of Memorial Hospital [facility] NetworkMedical Associates of [facility]: [phone] F: [phone]/18/2025 2:11 PMTo my patients:  Note was dictated using voice-recognition program which may result in minor transcription errors.

## 2024-12-03 NOTE — Telephone Encounter (Signed)
-----   Message from Joesph Hill, NP sent at 12/03/2024  1:48 PM EST -----Can you assist in getting pt scheduled for home lab draw? For A1c and CMP

## 2024-12-04 ENCOUNTER — Encounter: Payer: Self-pay | Admitting: Gastroenterology

## 2024-12-04 ENCOUNTER — Ambulatory Visit: Payer: Medicare (Managed Care) | Admitting: Family Medicine

## 2024-12-04 ENCOUNTER — Non-Acute Institutional Stay: Payer: Medicare (Managed Care)

## 2024-12-08 ENCOUNTER — Ambulatory Visit: Payer: Self-pay | Admitting: Family Medicine

## 2024-12-08 ENCOUNTER — Other Ambulatory Visit: Payer: Self-pay

## 2024-12-08 ENCOUNTER — Ambulatory Visit: Payer: Medicare (Managed Care) | Admitting: Acupuncturist

## 2024-12-08 ENCOUNTER — Other Ambulatory Visit
Admission: RE | Admit: 2024-12-08 | Discharge: 2024-12-08 | Disposition: A | Discharge status: Home / Self Care | Payer: Medicare (Managed Care) | Source: Ambulatory Visit | Attending: Family Medicine | Admitting: Family Medicine

## 2024-12-08 DIAGNOSIS — E1169 Type 2 diabetes mellitus with other specified complication: Secondary | ICD-10-CM | POA: Insufficient documentation

## 2024-12-08 DIAGNOSIS — G8929 Other chronic pain: Secondary | ICD-10-CM

## 2024-12-08 DIAGNOSIS — M545 Low back pain, unspecified: Secondary | ICD-10-CM

## 2024-12-08 LAB — COMPREHENSIVE METABOLIC PANEL
ALT: 29 U/L (ref 0–50)
AST: 27 U/L (ref 0–50)
Albumin: 4.1 g/dL (ref 3.5–5.2)
Alk Phos: 73 U/L (ref 40–130)
Anion Gap: 15 (ref 7–16)
Bilirubin,Total: 0.5 mg/dL (ref 0.0–1.2)
CO2: 21 mmol/L (ref 20–28)
Calcium: 9.6 mg/dL (ref 8.6–10.2)
Chloride: 103 mmol/L (ref 96–108)
Creatinine: 1.39 mg/dL — ABNORMAL HIGH (ref 0.67–1.17)
Lab: 34 mg/dL — ABNORMAL HIGH (ref 6–20)
Potassium: 5 mmol/L (ref 3.3–5.1)
Sodium: 139 mmol/L (ref 133–145)
Total Protein: 6.3 g/dL (ref 6.3–7.7)
eGFR BY CREAT: 48 — AB

## 2024-12-08 LAB — GLUCOSE: Glucose: 113 mg/dL — ABNORMAL HIGH (ref 60–99)

## 2024-12-08 LAB — HEMOGLOBIN A1C: Hemoglobin A1C: 6.8 % — ABNORMAL HIGH (ref <=5.6)

## 2024-12-13 NOTE — Progress Notes (Signed)
 ACUPUNCTURE  FOLLOW UP TREATMENT  NOTE Patient: Jon Detter HaefnerMRN: [id number]: 1935-10-311/28/2025SUBJECTIVEComplaint Status1.  Chronic low back pain  ModerateChanges since last treatmentLast seen for acupuncture on 12/01/24 for similar complaintStates that his lower back continues to be sore but feels better than it did last week when he was in for his visitDenies any radiation of pain into his gluteal region or LEDenies any numbness, tingling, weakness or b/b incontinenceHe is accompanied by his daughter today and ambulates with the assistance of a caneDenies any other Sx or complaints todayToday's Treatment Intention / Goal:Alleviate chronic low back painTREATMENTAcupunctureProne:Bilateral:HTJJ L1-S1 Electro Ran 1 Hz continuous on all of the above points. Placed the heat lamp over the low back for the duration of the tx. Did stationary cupping on the back. Applied Po Sum On oil to the neck and back post tx.  Treatment Number26 Assessment:  Findings consistent with 89 y.o. male with chronic low back pain.Prognosis:  Good Contraindications/Precautions/Limitations:  Per protocolTreatment Plan: Options / plan reviewed with patient:  YesTreatment Freq / Recommendations 1 times per week [x]  Todays visit included acupuncture with the use of electrical stimulationCharles A Leatrice, LAC  Minutes Initial Time Based CPTInitial AcupunctureInitial Acupuncture with Electrical Stimulation  15 min Subsequent Time Based CPTSubsequent Unit of AcupunctureSubsequent Unit of Acupuncture with Electrical Stimulation   10 min Non-Billable Time:Retained Needle Time, Heat, Ice, Rest 10 min Time spent with patient (face to face contact): 25 min     Total Treatment Time 35 min

## 2024-12-15 ENCOUNTER — Other Ambulatory Visit: Payer: Self-pay

## 2024-12-15 ENCOUNTER — Ambulatory Visit: Payer: Medicare (Managed Care) | Admitting: Acupuncturist

## 2024-12-15 DIAGNOSIS — G8929 Other chronic pain: Secondary | ICD-10-CM

## 2024-12-15 DIAGNOSIS — M545 Low back pain, unspecified: Secondary | ICD-10-CM

## 2024-12-15 NOTE — Progress Notes (Signed)
 ACUPUNCTURE  FOLLOW UP TREATMENT  NOTE Patient: Jon Kana HaefnerMRN: [id number]: 01/11/193512/30/2025SUBJECTIVEComplaint Status1.  Chronic low back pain  ModerateChanges since last treatmentLast seen for acupuncture on 12/08/24 for similar complaintStates that his lower back continues to be sore but is feeling better overallDenies any radiation of pain into his gluteal region or LEDenies any numbness, tingling, weakness or b/b incontinenceHe is accompanied by his daughter today and ambulates with the assistance of a caneDenies any other Sx or complaints todayToday's Treatment Intention / Goal:Alleviate chronic low back painTREATMENTAcupunctureProne:Bilateral:HTJJ L1-S1 Electro Ran 1 Hz continuous on all of the above points. Placed the heat lamp over the low back for the duration of the tx. Did stationary cupping on the back. Applied Po Sum On oil to the neck and back post tx.  Treatment Number27 Assessment:  Findings consistent with 89 y.o. male with chronic low back pain.Prognosis:  Good Contraindications/Precautions/Limitations:  Per protocolTreatment Plan: Options / plan reviewed with patient:  YesTreatment Freq / Recommendations 1 times per week [x]  Todays visit included acupuncture with the use of electrical stimulationCharles A Leatrice, LAC  Minutes Initial Time Based CPTInitial AcupunctureInitial Acupuncture with Electrical Stimulation  15 min Subsequent Time Based CPTSubsequent Unit of AcupunctureSubsequent Unit of Acupuncture with Electrical Stimulation   10 min Non-Billable Time:Retained Needle Time, Heat, Ice, Rest 10 min Time spent with patient (face to face contact): 25 min     Total Treatment Time 35 min

## 2024-12-18 ENCOUNTER — Other Ambulatory Visit: Payer: Self-pay

## 2024-12-18 ENCOUNTER — Other Ambulatory Visit: Payer: Self-pay | Admitting: Family Medicine

## 2024-12-18 DIAGNOSIS — E1169 Type 2 diabetes mellitus with other specified complication: Secondary | ICD-10-CM

## 2024-12-19 NOTE — Telephone Encounter (Signed)
 [facility] OF CARE NOTE1/3/2026[name]  Jun 24, 1934  MRN: [id number] of CarePlan:Orders Placed This Encounter  OZEMPIC , 0.25 OR 0.5 MG/DOSE, pen  Jon Hill, FNP-CFamily Medicine1/02/2025  11:31 AM____Medications Discontinued During This Encounter Medication Reason  semaglutide , 0.25 or 0.5 mg/dose, (OZEMPIC , 0.25 OR 0.5 MG/DOSE,) pen

## 2024-12-21 ENCOUNTER — Other Ambulatory Visit: Payer: Self-pay

## 2024-12-21 ENCOUNTER — Non-Acute Institutional Stay: Payer: Medicare (Managed Care)

## 2024-12-21 NOTE — Initial Assessments (Signed)
 FOCUS OF CARE: Chronic pain in back - neck and lower backPMHx includes  OA, DM, HTN, Paget's diseaseSUBJECTIVE/CURRENT STATUS:  Pt is a 89 y.o. with chronic lower back and neck pain.  Worse in the AM per.  Pt living in a rnach home w/ wife and daughter.  Family assisting pt w/ adl/iadls as needed.  Stated having 3 falls over the past year. Hx of orthostatic hypotension.  Last fall was about 3-4 wks ago.  Stated thinking he had a sore spot on his tailbone.  Visual inspection of coccyx area - pt w/ a small 0.5 cm x 0.5 cm scab.  Pt instructed pressure relief and use of moisture barrier 2x/d.  No sxs of infection.  Pt wanting PT to help w/ pain management, strength and activity tolerance building.  PAIN MEDS:  tylenol  1300mg  2x/dJUSTIFICATION of SKILL & OBJECTIVE TESTS:  Skilled PT required for strengthening, gait training/progression, instruction/progression of HEP, safety/fall prevention s/p chronic pain in back/neck-- PLOF: walker for mobility in home short distances.  assisted w/ iadls / adls as needed by family. -- ROM hip flx/abd, knee ext/flx [facility] bilat -- Strength: Right hip flx 3+/5, abd 3+/5; knee ext 4/5, knee flx 4/5; ankle DF/PF 4/5                   Left           3+/5         3+/5              3+/5             3+/5                   4/5--Transfers: Sit<>Stand - SBG for safety in transfers. Pt using UEs for support.  Bathroom - pt using grab bars for toilet transfers.  Grab bar/ shower chair for shower transfers.-- Bed Mobility: Sit<>supine - bedside rail for support in bed transfers. SBG for safety. -- Gait: pt amb with walker 50 ft x 4.  Pt w/ slower cadence and flexed posture w/ gait.  SBG for safety. Prolonged standing / walking causing increased lower back pain. -- Stairs: NAHOME EXERCISE PROGRAM/PROGRESSION:Instructed in:- ambulating every 2-3 hrs for activity tolerance building and pressure relief.  Pt v/u.  CAREGIVER INVOLVEMENT:  wife/daughterDISCHARGE PLANNING:   Plan for d/c when goals are met or pt ready to transition to out-pt PT - anticipate within 4-8 wks - pt in agreementPLAN FOR NEXT VISIT:Review/progress HEPGaitPain Management/stretching for lower back/neck

## 2024-12-22 ENCOUNTER — Ambulatory Visit: Payer: Medicare (Managed Care) | Admitting: Acupuncturist

## 2024-12-22 ENCOUNTER — Other Ambulatory Visit: Payer: Self-pay

## 2024-12-22 ENCOUNTER — Other Ambulatory Visit: Payer: Medicare (Managed Care)

## 2024-12-24 ENCOUNTER — Ambulatory Visit: Payer: Self-pay | Admitting: Rehabilitative and Restorative Service Providers"

## 2024-12-25 ENCOUNTER — Other Ambulatory Visit: Payer: Medicare (Managed Care) | Admitting: Rehabilitative and Restorative Service Providers"

## 2024-12-25 NOTE — Home Health (Signed)
 FOCUS OF CARE: pain management, strength and activity tolerance building due to Chronic pain in low back and neck PMHx includes OA, DM (pt does not check in the home), HTN, Paget's disease SUBJECTIVE/CURRENT STATUS: Pt sitting in recliner upon arrival with spouse Candis and dtr present - spouse also uses a fww for ambulation. Pt states today isn't a real good today - worse in the mornings until he gets loosened up.  Pt states he was a little dizzy this morning - he had a follow-up appt with the dentist this morning. BG's: p tdoes not check in the home MD Appts:12/29/24  Acupuncture  11:00am1/20/26  Acupuncture  11:00am1/27/26  Acupuncture  11:00am2/3/26    Acupuncture  11:00am2/6/26  Cardiology        4:00pmPAIN MEDS: 1300mg  Tylenol  2x/day JUSTIFICATION of SKILL & OBJECTIVE TESTS: Skilled PT required for strengthening, gait training/progression, instruction/progression of HEP, safety/fall prevention s/p chronic pain in back/neck-- PLOF: walker for mobility in home short distances. assisted w/ iadls / adls as needed by family.  -- Integument: small open area at coccyx - no s/sx of infection - pt/spouse is applying barrier cream-- ROM hip flx/abd, knee ext/flx [facility] bilat  -- Strength: Right hip flx 3+/5, abd 3+/5; knee ext 4/5, knee flx 4/5; ankle DF 4/5                   Left              3+/5         3+/5               3+/5             3+/5                 4/5 --Transfers: Sit<>Stand from recliner with use of bilat UE's and SBA for safety in transfers.  -- Bed Mobility: Sit<>supine on bed with use of bedside rail for support and a little extra time/effort. Supervision for safety.  -- Gait: pt amb with fww in home for 50 ft x2 with improved cadence today, but still with flexed posture. SBA for safety.   -- THer Ex/Balance: completed during visit:* HEP* Unsupported Standing balance with feet together 2x20 sec with close SBA HOME EXERCISE  PROGRAM/PROGRESSION:Instructed in/Completed: - ambulating every 2-3 hrs for activity tolerance building and pressure relief. Pt v/u.ADDED: - Supine single and double knees to chest stretches x2-3 reps ea with 5 sec holds - Lumbar Rotn stretch in hooklying x3 reps bilat with 5 sec holds- Supine heel slides x5 reps bilat- Seated knee ext x5 reps bilat- Seated cervical retration as tolerated x3 reps; and then cervical retraction with cervical rotns x3 reps bilatAll Exercises to be attempted 2x/day - pt' sdtr able to assist as needed.  Exercise sheet reviewed with pt/dtr. CAREGIVER INVOLVEMENT: wife/daughter asst as needed DISCHARGE PLANNING: Plan for d/c when goals are met or pt ready to transition to out-pt PT - anticipate within 4-8 wks - pt in agreement PLAN FOR NEXT VISIT:Review/progress HEPGaitBalancePain Management/stretching for lower back/neck

## 2024-12-28 ENCOUNTER — Other Ambulatory Visit: Payer: Self-pay

## 2024-12-28 ENCOUNTER — Other Ambulatory Visit: Payer: Medicare (Managed Care) | Admitting: Rehabilitative and Restorative Service Providers"

## 2024-12-28 NOTE — Home Health (Signed)
 FOCUS OF CARE: pain management, strength and activity tolerance building due to Chronic pain in low back and neck PMHx includes OA, DM (pt does not check in the home), HTN, Paget's disease SUBJECTIVE/CURRENT STATUS: Pt sitting on couch upon arrival with spouse Candis present. Pt states his R knee was weak and giving out for ~15 min after last visit.  He states he still has neck and back pain (esp in the mornings) but overall the exercises seem helpful.  No falls reported.BG's: pt does not check in the home MD Appts:12/29/24  Acupuncture  11:00am1/20/26  Acupuncture  11:00am1/27/26  Acupuncture  11:00am2/3/26    Acupuncture  11:00am2/6/26  Cardiology        4:00pmPAIN MEDS: 1300mg  Tylenol  2x/day JUSTIFICATION of SKILL & OBJECTIVE TESTS: Skilled PT required for strengthening, gait training/progression, instruction/progression of HEP, safety/fall prevention s/p chronic pain in back/neck-- Integument: small open area at coccyx - no s/sx of infection - pt/spouse is applying barrier cream.--Transfers: Sit<>Stand from recliner with use of bilat UE's and supervision for safety in transfers - less effort today to rise up.  -- Bed Mobility: Sit<>supine on bed with use of bedside rail for support and a little extra time/effort. More distant supervision today.  -- Gait: pt amb with fww in home for 40 ft and 130 ft with fairly good cadence, but still with flexed posture. Supervision for safety.   -- Ther Ex/Balance: completed during visit:* HEP* Unsupported Standing balance with feet together 3x20 sec with close SBA HOME EXERCISE PROGRAM/PROGRESSION:Instructed in/Completed: - Supine single and double knees to chest stretches x2-3 reps ea with 5 sec holds (2x/day)- Lumbar Rotn stretch in hooklying x3 reps bilat with 5 sec holds (2x/day)- Supine heel slides progressed to 8 reps bilat (2x/day)- Seated knee ext progressed to 10 reps bilat (2x/day)- Seated  cervical retraction as tolerated x3 reps; and then cervical retraction with cervical rotns x3 reps bilat (2x/day)- ambulating every 1-2 hrs for activity tolerance building and pressure relief. Pt v/u.ADDED: Supine SLR x5 reps bilat; and Seated hip flx x10 reps bilat (2x/day)Pt's dtr able to assist as needed.  Exercise sheet reviewed with pt/dtr. CAREGIVER INVOLVEMENT: wife/daughter asst as needed DISCHARGE PLANNING: Plan for d/c when goals are met or pt ready to transition to out-pt PT - anticipate within 4-6 wks - pt in agreement PLAN FOR NEXT VISIT:Review/progress HEPGaitBalancePain Management/stretching for lower back/neck

## 2024-12-29 ENCOUNTER — Ambulatory Visit: Payer: Medicare (Managed Care) | Admitting: Acupuncturist

## 2024-12-29 ENCOUNTER — Other Ambulatory Visit: Payer: Self-pay

## 2024-12-29 DIAGNOSIS — M545 Low back pain, unspecified: Secondary | ICD-10-CM

## 2024-12-29 DIAGNOSIS — G8929 Other chronic pain: Secondary | ICD-10-CM

## 2024-12-29 NOTE — Progress Notes (Signed)
 ACUPUNCTURE  FOLLOW UP TREATMENT  NOTE Patient: Kenzie Flakes HaefnerMRN: [id number]: [id number]/2026SUBJECTIVEComplaint Status1.  Chronic low back pain  ModerateChanges since last treatmentLast seen for acupuncture on 12/15/24 for similar complaint. Recently got approved for in home PT so he has been compliant with that and his daughter who has a background in personal training has been assisting him as well. Denies any new Sx or complaints today. Today's Treatment Intention / Goal:Alleviate chronic low back painTREATMENTAcupunctureProne:Bilateral:HTJJ L1-S1 Electro Ran 1 Hz continuous on all of the above points. Placed the heat lamp over the low back for the duration of the tx. Did stationary cupping on the back. Applied Po Sum On oil to the back post tx.  Treatment Number1 Assessment:  Findings consistent with 89 y.o. male with chronic low back pain.Prognosis:  Good Contraindications/Precautions/Limitations:  Per protocolTreatment Plan: Options / plan reviewed with patient:  YesTreatment Freq / Recommendations 1 times per week [x]  Todays visit included acupuncture with the use of electrical stimulationCharles A Leatrice, LAC  Minutes Initial Time Based CPTInitial AcupunctureInitial Acupuncture with Electrical Stimulation  15 min Subsequent Time Based CPTSubsequent Unit of AcupunctureSubsequent Unit of Acupuncture with Electrical Stimulation   10 min Non-Billable Time:Retained Needle Time, Heat, Ice, Rest 10 min Time spent with patient (face to face contact): 25 min     Total Treatment Time 35 min

## 2024-12-31 ENCOUNTER — Other Ambulatory Visit: Payer: Medicare (Managed Care) | Admitting: Rehabilitative and Restorative Service Providers"

## 2024-12-31 NOTE — Home Health (Signed)
 FOCUS OF CARE: pain management, strength and activity tolerance building due to Chronic pain in low back and neckPMHx includes OA, DM (pt does not check in the home), HTN, Paget's disease SUBJECTIVE/CURRENT STATUS: Pt sitting on couch upon arrival with spouse Candis present. Pt had acupuncture appt on 12/29/24 which pt reports was helpful, and there are times that he has no pain at all.  No falls reported.BG's: pt does not check in the home MD Appts:01/05/25  Acupuncture  11:00am1/27/26  Acupuncture  11:00am2/3/26    Acupuncture  11:00am2/6/26  Cardiology        4:00pmPAIN MEDS: 1300mg  Tylenol  2x/day JUSTIFICATION of SKILL & OBJECTIVE TESTS: Skilled PT required for strengthening, gait training/progression, instruction/progression of HEP, safety/fall prevention s/p chronic pain in back/neck-- Integument: small open area at coccyx/gluteal fold - no s/sx of infection - pt/spouse is applying barrier cream. Photo/wound addendum completed today--Transfers: Sit<>Stand from couch with use of bilat UE's and more distant supervision and cues to lean fwd to get body wt over forefoot as pt tends to brace backs of legs against couch or bed to rise up.  -- Bed Mobility: Sit<>supine on bed with use of bedside rail for support and a little extra time/effort indep/distant supervision today.  -- Gait: pt amb with fww in home for 40 ft and 130 ft with fairly good cadence, amd slightly better posture today with more distant superviison.   -- Ther Ex/Balance: completed during visit:* HEP* Unsupported Standing balance with feet together x30 sec with SBA* Unsupported semi-tandem stands x25 sec bilat with close SBA* Repeated sit<>stands at edge of bed with arms folded at chest x3 reps with CGA and cues to lean fwd to get body wt over forefoot  HOME EXERCISE PROGRAM/PROGRESSION:Instructed in/Completed: - Supine single and double knees to chest stretches x3 reps ea with 5 sec  holds (2x/day)- Lumbar Rotn stretch in hooklying x3 reps bilat with 5 sec holds (2x/day)- Supine heel slides progressed to 10 reps bilat (2x/day)- Supine SLR progressed to 7 reps (2x/day)- Seated hip flx and knee ext progressed to 12 reps bilat (2x/day)- Seated cervical retraction as tolerated x3 reps; and then cervical retraction with cervical rotns x3 reps bilat (2x/day)- ambulating every 1-2 hrs for activity tolerance building and pressure relief. Pt v/u.ADDED: Standing hip abd (alt legs), knee flx curls (alt legs), and heel/toe raises x5 reps ea with bilat UE support at sinkPt's dtr able to assist as needed.  Exercise sheet reviewed with pt/dtr. CAREGIVER INVOLVEMENT: wife/daughter asst as needed DISCHARGE PLANNING: Plan for d/c when goals are met or pt ready to transition to out-pt PT - anticipate within 4-6 wks  PLAN FOR NEXT VISIT:Review/progress HEPGaitBalancePain Management/stretching for lower back/neck

## 2025-01-04 DIAGNOSIS — Z7985 Long-term (current) use of injectable non-insulin antidiabetic drugs: Secondary | ICD-10-CM

## 2025-01-04 DIAGNOSIS — M47812 Spondylosis without myelopathy or radiculopathy, cervical region: Secondary | ICD-10-CM

## 2025-01-04 DIAGNOSIS — F0150 Vascular dementia without behavioral disturbance: Secondary | ICD-10-CM

## 2025-01-04 DIAGNOSIS — Z96659 Presence of unspecified artificial knee joint: Secondary | ICD-10-CM

## 2025-01-04 DIAGNOSIS — M545 Low back pain, unspecified: Secondary | ICD-10-CM

## 2025-01-04 DIAGNOSIS — Z7984 Long term (current) use of oral hypoglycemic drugs: Secondary | ICD-10-CM

## 2025-01-04 DIAGNOSIS — M1990 Unspecified osteoarthritis, unspecified site: Secondary | ICD-10-CM

## 2025-01-04 DIAGNOSIS — I509 Heart failure, unspecified: Secondary | ICD-10-CM

## 2025-01-04 DIAGNOSIS — M889 Osteitis deformans of unspecified bone: Secondary | ICD-10-CM

## 2025-01-04 DIAGNOSIS — M25561 Pain in right knee: Secondary | ICD-10-CM

## 2025-01-04 DIAGNOSIS — E1169 Type 2 diabetes mellitus with other specified complication: Secondary | ICD-10-CM

## 2025-01-04 DIAGNOSIS — D6859 Other primary thrombophilia: Secondary | ICD-10-CM

## 2025-01-04 DIAGNOSIS — Z7901 Long term (current) use of anticoagulants: Secondary | ICD-10-CM

## 2025-01-04 DIAGNOSIS — I110 Hypertensive heart disease with heart failure: Secondary | ICD-10-CM

## 2025-01-04 DIAGNOSIS — Z87442 Personal history of urinary calculi: Secondary | ICD-10-CM

## 2025-01-04 DIAGNOSIS — L89159 Pressure ulcer of sacral region, unspecified stage: Secondary | ICD-10-CM

## 2025-01-04 DIAGNOSIS — G8929 Other chronic pain: Secondary | ICD-10-CM

## 2025-01-05 ENCOUNTER — Other Ambulatory Visit: Payer: Medicare (Managed Care) | Admitting: Rehabilitative and Restorative Service Providers"

## 2025-01-05 ENCOUNTER — Ambulatory Visit: Payer: Medicare (Managed Care) | Admitting: Acupuncturist

## 2025-01-05 ENCOUNTER — Other Ambulatory Visit: Payer: Self-pay

## 2025-01-05 NOTE — Home Health (Signed)
 FOCUS OF CARE: pain management, strength and activity tolerance building due to Chronic pain in low back and neckPMHx includes OA, DM (pt does not check in the home), HTN, Paget's disease SUBJECTIVE/CURRENT STATUS: Pt sitting in new lift-recliner upon arrival with spouse Candis present. Pt's acupuncture appt this morning was cancelled due to weather.  Pt states that overall he is doing better, but his R knee is bothering him today.  No falls reported.BG's: pt does not check in the home MD Appts:01/12/25  Acupuncture  11:00am2/3/26    Acupuncture  11:00am2/6/26  Cardiology        4:00pmPAIN MEDS: 1300mg  Tylenol  2x/day JUSTIFICATION of SKILL & OBJECTIVE TESTS: Skilled PT required for strengthening, gait training/progression, instruction/progression of HEP, safety/fall prevention s/p chronic pain in back/neck-- Integument: small open area at coccyx/gluteal fold - no s/sx of infection - pt/spouse is applying barrier cream 2x/day. Photo/wound addendum completed today.--Transfers: Sit<>Stand from lift recliner elevated ~50% with use of bilat UE's independently; SIt<>stand at edge of bed independently -- Bed Mobility: Sit<>supine on bed independently with a little extra time/effort - has bedside rail for support if needed.  -- Gait: pt amb with fww in home for 40 ft and 180 ft with good cadence initially and slows down a bit towards end of walk due to fatigue and increased pain with distant supervision.   -- Ther Ex/Balance: completed during visit:* HEP* Seated lumbar flx stretch 3x10 sec* Unsupported Standing balance with feet together x30 sec with SBA* Unsupported semi-tandem stands x25 sec bilat with close SBA* Repeated sit<>stands at edge of bed with arms folded at chest x5 reps with CGA and cues to lean fwd to get body wt over forefoot  HOME EXERCISE PROGRAM/PROGRESSION:Instructed in/Completed: - Supine single and double knees to chest stretches x3 reps  ea with 5 sec holds (2x/day)- Lumbar Rotn stretch in hooklying x3 reps bilat with 5 sec holds (2x/day)- Supine heel slides progressed to 10 reps bilat (2x/day)- Supine SLR progressed to 8 reps (2x/day)- Seated hip flx and knee ext progressed to 15 reps bilat (2x/day)- Supine cervical retraction as tolerated x5 reps with 5 sec holds; and then seated cervical retraction with cervical rotns x3 reps bilat (2x/day)- Standing hip abd (alt legs), knee flx curls (alt legs), and heel/toe raises progressed to 8 reps ea with bilat UE support at sink- ambulating every 1-2 hrs for activity tolerance building and pressure relief. Pt v/u.Pt's dtr able to assist as needed.  Exercise sheet reviewed with pt/dtr. CAREGIVER INVOLVEMENT: wife/daughter asst as needed DISCHARGE PLANNING: Plan for d/c when goals are met or pt ready to transition to out-pt PT - anticipate within 4 wks  PLAN FOR NEXT VISIT:Review/progress HEPGaitBalancePain Management/stretching for lower back/neck

## 2025-01-06 ENCOUNTER — Ambulatory Visit: Payer: Self-pay | Admitting: Rehabilitative and Restorative Service Providers"

## 2025-01-08 ENCOUNTER — Other Ambulatory Visit: Payer: Medicare (Managed Care) | Admitting: Rehabilitative and Restorative Service Providers"

## 2025-01-08 ENCOUNTER — Other Ambulatory Visit: Payer: Self-pay

## 2025-01-08 NOTE — Home Health (Signed)
 FOCUS OF CARE: pain management, strength and activity tolerance building due to Chronic pain in low back and neckPMHx includes OA, DM (pt does not check in the home), HTN, Paget's disease SUBJECTIVE/CURRENT STATUS: Pt sitting in lift-recliner upon arrival with spouse Candis and dtr present - education provided to make sure pt lowers lift recliner to neutral position after sitting down. Pt states his back and neck are more achy since missing the acupuncture appt earlier this week.   No falls reported.BG's: pt does not check in the home MD Appts:01/12/25  Acupuncture  11:00am2/3/26    Acupuncture  11:00am2/6/26  Cardiology        4:00pmPAIN MEDS: 1300mg  Tylenol  2x/day JUSTIFICATION of SKILL & OBJECTIVE TESTS: Skilled PT required for strengthening, gait training/progression, instruction/progression of HEP, safety/fall prevention s/p chronic pain in back/neck-- Integument: small open area at coccyx/gluteal fold - no s/sx of infection - pt/spouse is applying barrier cream at least 2x/day. --Transfers: Sit<>Stand from lift recliner elevated ~50% and from edge of bed with use of bilat UE's independently - cues provided to avaoid bracing backs of legs and pushing against chair or bed -- Bed Mobility: Sit<>supine on bed independently - has bedside rail for support if needed.  -- Gait: pt amb with fww in home for 200 ft with distant supervision - ambulates with good cadence initially and slows down a bit towards end of walk due to fatigue and increased back pain    -- Ther Ex/Balance: completed during visit:* HEP* Seated lumbar flx stretch 3x10 sec* Standing heel/toe raises x10 reps with support of walker* Unsupported Standing balance with feet together x30 sec with SBA* Unsupported semi-tandem stands x30 sec bilat with close SBA* Repeated sit<>stands at edge of bed with arms folded at chest x5 reps with CGA and cues to lean fwd to get body wt over forefoot  HOME  EXERCISE PROGRAM/PROGRESSION:Instructed in/Completed: - Supine single and double knees to chest stretches x3 reps ea with 5 sec holds (2x/day)- Lumbar Rotn stretch in hooklying x3 reps bilat with 5 sec holds (2x/day)- Supine heel slides progressed to 10 reps bilat (2x/day)- Supine SLR progressed to 10 reps (2x/day)- Seated hip flx and knee ext progressed to 17 reps bilat (2x/day)- Supine cervical retraction as tolerated x5 reps with 5 sec holds; and then seated cervical retraction with cervical rotns x3 reps bilat (2x/day)- Standing hip abd (alt legs), knee flx curls (alt legs), and heel/toe raises x8 reps ea with bilat UE support at sink- ambulating every 1-2 hrs for activity tolerance building and pressure relief. Pt v/u.Pt's dtr able to assist as needed.  Exercise sheet reviewed with pt/dtr. CAREGIVER INVOLVEMENT: wife/daughter asst as needed DISCHARGE PLANNING: Plan for d/c when goals are met or pt ready to transition to out-pt PT - anticipate within 4 wks  PLAN FOR NEXT VISIT:Review/progress HEPGaitBalancePain Management/stretching for lower back/neck

## 2025-01-11 ENCOUNTER — Other Ambulatory Visit: Payer: Self-pay | Admitting: Rehabilitative and Restorative Service Providers"

## 2025-01-11 ENCOUNTER — Other Ambulatory Visit: Payer: Self-pay

## 2025-01-11 NOTE — Home Health (Signed)
 FOCUS OF CARE: pain management, strength and activity tolerance building due to Chronic pain in low back and neckPMHx includes OA, DM (pt does not check in the home), HTN, Paget's disease SUBJECTIVE/CURRENT STATUS:Pt sitting in lift-recliner upon arrival stating he is doing better overall today. Pt was reminded not to sit with chair in elevated position (for safety).  Pt states his back and neck are achy and rates it at 4/10 currently.   No falls reported. Pt states he is compliant with doing the stretching exercises every morning and that helps him get going.BG's: pt does not check in the home MD Appts:01/12/25  Acupuncture  11:00am2/3/26    Acupuncture  11:00am2/6/26  Cardiology        4:00pmPAIN MEDS: 1300mg  Tylenol  2x/day JUSTIFICATION of SKILL & OBJECTIVE TESTS: Skilled PT required for strengthening, gait training/progression, instruction/progression of HEP, safety/fall prevention s/p chronic pain in back/neck-- Integument: small open area at coccyx/gluteal fold - no s/sx of infection - pt/spouse is applying barrier cream at least 2x/day. Photo/wound addendum completed today.-- ROM hip flx/abd, knee ext/flx [facility] bilat  -- Strength: Right hip flx 4/5, abd 4-/5; knee ext 4/5, knee flx 4/5; ankle DF 4+/5                   Left              4/5         4-/5               4/5             4/5                 4+/5--Transfers: Sit<>Stand from lift recliner elevated ~50% and from edge of bed with use of bilat UE's independently - cues provided to avoid bracing backs of legs and pushing against chair or bed when rising up -- Bed Mobility: Sit<>supine on bed independently - has bedside rail for support if needed.  -- Gait: pt amb with fww in home for 225 ft indep/distant supervision with good cadence today-- SPPB Assessment 6/12 -- Ther Ex/Balance: completed during visit:* HEP* Seated lumbar flx stretch 3x10 sec* Standing hip flx/marching, and heel/toe  raises x10 reps with support of walker* Unsupported Standing balance with feet together x30 sec with SBA* Unsupported semi-tandem stands x30 sec bilat with SBA* Repeated sit<>stands at edge of bed with arms folded at chest x5 reps with CGA and initial cues to lean fwd to get body wt over mid/forefoot  HOME EXERCISE PROGRAM/PROGRESSION:- Supine single and double knees to chest stretches x3 reps ea with 5 sec holds (2x/day)- Lumbar Rotn stretch in hooklying x3 reps bilat with 5 sec holds (2x/day)- Supine heel slides x10 reps bilat (2x/day)- Supine SLR x10 reps (2x/day)- Seated hip flx and knee ext progressed to 20 reps bilat (2x/day)- Supine cervical retraction as tolerated x5 reps with 5 sec holds; and then seated cervical retraction with cervical rotns x3 reps bilat (2x/day)- Standing hip flx/marching, and heel/toe raises x8 reps ea with bilat UE support at sink (Hip abd and knee flx removed form HEP due to increased R knee pain with these exercises)- ambulating every 1-2 hrs for activity tolerance building and pressure relief. Pt v/u.Pt's dtr able to assist as needed.  Exercise sheet reviewed with pt/dtr. CAREGIVER INVOLVEMENT: wife/daughter asst as needed DISCHARGE PLANNING: Plan for d/c when goals are met - anticipate within 2-3 wks  PLAN FOR NEXT VISIT:Review/progress HEPGaitBalancePain Management/stretching for lower back/neck

## 2025-01-12 ENCOUNTER — Ambulatory Visit: Payer: Medicare (Managed Care) | Admitting: Acupuncturist

## 2025-01-12 ENCOUNTER — Other Ambulatory Visit: Payer: Self-pay

## 2025-01-12 DIAGNOSIS — M545 Low back pain, unspecified: Secondary | ICD-10-CM

## 2025-01-12 DIAGNOSIS — G8929 Other chronic pain: Secondary | ICD-10-CM

## 2025-01-12 NOTE — Progress Notes (Signed)
 ACUPUNCTURE  FOLLOW UP TREATMENT  NOTE Patient: Jon Wandler HaefnerMRN: [id number]: 1935/09/011/27/2026SUBJECTIVEComplaint Status1.  Chronic low back pain  ModerateChanges since last treatmentLast seen for acupuncture on 12/29/24 for similar complaint. Has been doing well with PT at home per his daughter who is present today. He states that he is doing fairly well overall and feels as if he is slowly getting stronger. Denies any other Sx or complaints today. Today's Treatment Intention / Goal:Alleviate chronic low back painTREATMENTAcupunctureProne:Bilateral:HTJJ L1-S1 Electro Ran 2 Hz continuous on all of the above points. Placed the heat lamp over the low back for the duration of the tx. Did stationary cupping on the back. Applied Po Sum On oil to the back post tx.  Treatment Number2 Assessment:  Findings consistent with 89 y.o. male with chronic low back pain.Prognosis:  Good Contraindications/Precautions/Limitations:  Per protocolTreatment Plan: Options / plan reviewed with patient:  YesTreatment Freq / Recommendations 1 times per week [x]  Todays visit included acupuncture with the use of electrical stimulationCharles A Leatrice, LAC  Minutes Initial Time Based CPTInitial AcupunctureInitial Acupuncture with Electrical Stimulation  15 min Subsequent Time Based CPTSubsequent Unit of AcupunctureSubsequent Unit of Acupuncture with Electrical Stimulation   10 min Non-Billable Time:Retained Needle Time, Heat, Ice, Rest 10 min Time spent with patient (face to face contact): 25 min     Total Treatment Time 35 min

## 2025-01-14 ENCOUNTER — Ambulatory Visit: Payer: Self-pay | Admitting: Rehabilitative and Restorative Service Providers"

## 2025-01-15 ENCOUNTER — Other Ambulatory Visit: Payer: Medicare (Managed Care) | Admitting: Rehabilitative and Restorative Service Providers"

## 2025-01-15 NOTE — Home Health (Signed)
 FOCUS OF CARE: pain management, strength and activity tolerance building due to Chronic pain in low back and neckPMHx includes OA, DM (pt does not check in the home), HTN, Paget's disease SUBJECTIVE/CURRENT STATUS: Pt sitting in lift-recliner with LE's elevated upon arrival stating he was able to have the acupuncture appt on Tuesday and that the appts are helpful.  Pt states his back and neck are achy and rates it at 3/10 currently.   No falls reported. Pt states he is compliant with doing the stretching exercises every morning and that helps him get going.BG's: pt does not check in the home MD Appts:01/19/25    Acupuncture   11:00am2/6/26    Cardiology       4:00pm2/10/26  Acupuncture   11:00am2/13/26  PCP                1:00pmPAIN MEDS: 1300mg  Tylenol  2x/day JUSTIFICATION of SKILL & OBJECTIVE TESTS: Skilled PT required for strengthening, gait training/progression, instruction/progression of HEP, safety/fall prevention s/p chronic pain in back/neck-- Integument: small open area at coccyx/gluteal fold - no s/sx of infection - pt/spouse is applying barrier cream at least 2x/day. -- ROM hip flx/abd, knee ext/flx [facility] bilat  -- Strength: Right hip flx 4/5, abd 4-/5; knee ext 4/5, knee flx 4/5; ankle DF 4+/5                   Left              4/5         4-/5                4/5                4/5                 4+/5--Transfers: Sit<>Stand from lift recliner elevated ~50% and from edge of bed with use of bilat UE's independently - initial reminders provided to avoid bracing backs of legs and pushing against chair or bed when rising up -- Bed Mobility: Sit<>supine on bed independently - has bedside rail for support if needed.  -- Gait: pt amb with fww in home for 250 ft indep/distant supervision with good cadence -- SPPB Assessment 6/12 (on 01/11/25) -- Ther Ex/Balance: completed during visit:* Supine double knees to chest stretches x3 reps ea with 5 sec  holds* Lumbar Rotn stretch in hooklying x3 reps bilat with 5 sec holds * Supine heel slides x10 reps bilat * Supine SLR x10 reps bilat* Standing hip flx/marching, and heel/toe raises x10 reps with support of walker* Unsupported Standing balance with feet together x30 sec with SBA* Unsupported semi-tandem stands x30 sec bilat with SBA* Repeated sit<>stands at edge of bed with arms folded at chest x6 reps with CGA and initial cues to lean fwd to get body wt over mid/forefoot  HOME EXERCISE PROGRAM/PROGRESSION:- Supine single and double knees to chest stretches x3 reps ea with 5 sec holds (2x/day)- Lumbar Rotn stretch in hooklying x3 reps bilat with 5 sec holds (2x/day)- Supine heel slides x10 reps bilat (2x/day)- Supine SLR x10 reps (2x/day)- Seated hip flx and knee ext progressed to 20 reps bilat (2x/day)- Supine cervical retraction as tolerated x5 reps with 5 sec holds; and then seated cervical retraction with cervical rotns progressed to 5 reps bilat (2x/day)- Standing hip flx/marching, and heel/toe raises x8 reps ea with bilat UE support at sink - ambulating every 1-2 hrs for activity tolerance building and pressure relief. Pt v/u.Pt's  dtr able to assist as needed.  Exercise sheet reviewed with pt/dtr. CAREGIVER INVOLVEMENT: wife/daughter asst as needed - dtr assists with HEP DISCHARGE PLANNING: Plan for d/c when goals are met - anticipate within 2 wks - pt/spouse in agreement PLAN FOR NEXT VISIT:Review/progress HEPGaitBalancePain Management/stretching for lower back/neck

## 2025-01-18 ENCOUNTER — Other Ambulatory Visit: Payer: Self-pay

## 2025-01-18 ENCOUNTER — Other Ambulatory Visit: Payer: Self-pay | Admitting: Rehabilitative and Restorative Service Providers"

## 2025-01-18 NOTE — Home Health (Signed)
 FOCUS OF CARE: pain management, strength and activity tolerance building due to Chronic pain in low back and neckPMHx includes OA, DM (pt does not check in the home), HTN, Paget's disease SUBJECTIVE/CURRENT STATUS: Pt sitting in lift-recliner with LE's elevated upon arrival stating he has been practicing getting up without bracing legs against the chair and feels he is improving overall.  Pt states his back/neck and R knee aren't too bad today and rates it at 2-3/10 currently.   No falls reported. Pt states he is compliant with doing the stretching exercises every morning and that helps him get going.BG's: pt does not check in the home MD Appts:01/19/25    Acupuncture   11:00am2/6/26    Cardiology       4:00pm2/10/26  Acupuncture   11:00am2/13/26  PCP                1:00pmPAIN MEDS: 1300mg  Tylenol  2x/day JUSTIFICATION of SKILL & OBJECTIVE TESTS: Skilled PT required for strengthening, gait training/progression, instruction/progression of HEP, safety/fall prevention s/p chronic pain in back/neck-- Integument: small open area at coccyx/gluteal fold - area is scabbed over with no s/sx of infection - pt/spouse is applying barrier cream at least 2x/day. Photo/wound addendum completed today.--Transfers: Sit<>Stand from lift recliner elevated ~40% and from edge of bed with use of bilat UE's independently  -- Bed Mobility: Sit<>supine on bed independently - has bedside rail for support if needed.  -- Gait: pt amb with fww in home independently for ~250 ft with good step length and cadence -- Ther Ex/Balance: completed during visit:* Supine double knees to chest stretches x3 reps ea with 5 sec holds* Lumbar Rotn stretch in hooklying x3 reps bilat with 5 sec holds * Supine heel slides x10 reps bilat * Supine SLR x10 reps bilat* Standing hip flx/marching, and heel/toe raises x10 reps with support of walker* Unsupported Standing balance with feet together x30 sec with  SBA* Unsupported semi-tandem stands x30 sec bilat with SBA* Repeated sit<>stands at edge of bed with arms folded at chest x7 reps with CGA and only initial cues to lean fwd to get body wt over mid/forefoot  HOME EXERCISE PROGRAM/PROGRESSION:- Supine single and double knees to chest stretches x3 reps ea with 5 sec holds (2x/day)- Lumbar Rotn stretch in hooklying x3 reps bilat with 5 sec holds (2x/day)- Supine heel slides x10 reps bilat (2x/day)- Supine SLR x10 reps (2x/day)- Seated hip flx and knee ext progressed to 20 reps bilat (2x/day)- Supine cervical retraction as tolerated x5 reps with 5 sec holds; and then seated cervical retraction with cervical rotns x5 reps bilat (2x/day)- Standing hip flx/marching, and heel/toe raises progressed to 10 reps ea with bilat UE support at sink - ambulating every 1-2 hrs for activity tolerance building and pressure relief. Pt v/u.Pt's dtr able to assist as needed.  Exercise sheet reviewed with pt/dtr. CAREGIVER INVOLVEMENT: wife/daughter asst as needed - dtr assists with HEP DISCHARGE PLANNING: Plan for d/c next visit - pt/spouse in agreement PLAN FOR NEXT VISIT:Review/progress HEPGaitBalancePain Management/stretching for lower back/neck

## 2025-01-19 ENCOUNTER — Ambulatory Visit: Payer: Medicare (Managed Care) | Admitting: Acupuncturist

## 2025-01-19 ENCOUNTER — Other Ambulatory Visit: Payer: Self-pay

## 2025-01-19 DIAGNOSIS — G8929 Other chronic pain: Secondary | ICD-10-CM

## 2025-01-19 DIAGNOSIS — M545 Low back pain, unspecified: Secondary | ICD-10-CM

## 2025-01-25 ENCOUNTER — Ambulatory Visit: Payer: Self-pay | Admitting: Rehabilitative and Restorative Service Providers"

## 2025-01-26 ENCOUNTER — Ambulatory Visit: Payer: Medicare (Managed Care) | Admitting: Acupuncturist

## 2025-01-29 ENCOUNTER — Ambulatory Visit: Payer: Medicare (Managed Care) | Admitting: Primary Care

## 2025-02-01 ENCOUNTER — Ambulatory Visit: Payer: Self-pay | Admitting: Rehabilitative and Restorative Service Providers"

## 2025-02-02 ENCOUNTER — Ambulatory Visit: Payer: Medicare (Managed Care) | Admitting: Acupuncturist

## 2025-02-08 ENCOUNTER — Ambulatory Visit: Payer: Self-pay | Admitting: Rehabilitative and Restorative Service Providers"

## 2025-02-09 ENCOUNTER — Ambulatory Visit: Payer: Medicare (Managed Care) | Admitting: Acupuncturist

## 2025-02-16 ENCOUNTER — Ambulatory Visit: Payer: Medicare (Managed Care) | Admitting: Acupuncturist

## 2025-02-23 ENCOUNTER — Ambulatory Visit: Payer: Medicare (Managed Care) | Admitting: Primary Care

## 2025-02-23 ENCOUNTER — Ambulatory Visit: Payer: Medicare (Managed Care) | Admitting: Acupuncturist

## 2025-03-02 ENCOUNTER — Ambulatory Visit: Payer: Medicare (Managed Care) | Admitting: Acupuncturist

## 2025-03-03 ENCOUNTER — Ambulatory Visit: Payer: Medicare (Managed Care) | Admitting: Family Medicine

## 2025-03-09 ENCOUNTER — Ambulatory Visit: Payer: Medicare (Managed Care) | Admitting: Acupuncturist

## 2025-03-16 ENCOUNTER — Ambulatory Visit: Payer: Medicare (Managed Care) | Admitting: Acupuncturist

## 2025-04-16 ENCOUNTER — Ambulatory Visit: Payer: Medicare (Managed Care)
